# Patient Record
Sex: Male | Born: 1980
Health system: Southern US, Community
[De-identification: ages and names within clinical notes are randomized; demographics above are authoritative.]

## PROBLEM LIST (undated history)

## (undated) DIAGNOSIS — F32A Depression, unspecified: Secondary | ICD-10-CM

## (undated) DIAGNOSIS — K219 Gastro-esophageal reflux disease without esophagitis: Secondary | ICD-10-CM

## (undated) DIAGNOSIS — A4902 Methicillin resistant Staphylococcus aureus infection, unspecified site: Secondary | ICD-10-CM

## (undated) DIAGNOSIS — F419 Anxiety disorder, unspecified: Secondary | ICD-10-CM

## (undated) HISTORY — DX: Depression, unspecified: F32.A

## (undated) HISTORY — DX: Methicillin resistant Staphylococcus aureus infection, unspecified site: A49.02

## (undated) HISTORY — DX: Gastro-esophageal reflux disease without esophagitis: K21.9

## (undated) HISTORY — PX: NECK SURGERY: SHX720

## (undated) HISTORY — PX: OTHER SURGICAL HISTORY: SHX169

## (undated) HISTORY — DX: Anxiety disorder, unspecified: F41.9

---

## 2003-01-13 DIAGNOSIS — A4902 Methicillin resistant Staphylococcus aureus infection, unspecified site: Secondary | ICD-10-CM

## 2003-01-13 HISTORY — DX: Methicillin resistant Staphylococcus aureus infection, unspecified site: A49.02

## 2007-08-24 DIAGNOSIS — B353 Tinea pedis: Secondary | ICD-10-CM | POA: Insufficient documentation

## 2007-08-24 DIAGNOSIS — L408 Other psoriasis: Secondary | ICD-10-CM | POA: Insufficient documentation

## 2008-07-29 ENCOUNTER — Emergency Department (HOSPITAL_COMMUNITY): Admission: EM | Admit: 2008-07-29 | Discharge: 2008-07-29 | Payer: Self-pay | Admitting: Emergency Medicine

## 2009-04-01 ENCOUNTER — Encounter (INDEPENDENT_AMBULATORY_CARE_PROVIDER_SITE_OTHER): Payer: Self-pay | Admitting: *Deleted

## 2009-04-12 ENCOUNTER — Ambulatory Visit: Payer: Self-pay | Admitting: Family Medicine

## 2009-04-12 DIAGNOSIS — G909 Disorder of the autonomic nervous system, unspecified: Secondary | ICD-10-CM | POA: Insufficient documentation

## 2009-04-18 ENCOUNTER — Ambulatory Visit: Payer: Self-pay | Admitting: Family Medicine

## 2009-04-19 LAB — CONVERTED CEMR LAB
Albumin: 4.4 g/dL (ref 3.5–5.2)
BUN: 12 mg/dL (ref 6–23)
Basophils Relative: 0.7 % (ref 0.0–3.0)
CO2: 29 meq/L (ref 19–32)
Calcium: 9.8 mg/dL (ref 8.4–10.5)
Creatinine, Ser: 0.9 mg/dL (ref 0.4–1.5)
Eosinophils Relative: 6.1 % — ABNORMAL HIGH (ref 0.0–5.0)
Glucose, Bld: 82 mg/dL (ref 70–99)
HCT: 39.5 % (ref 39.0–52.0)
HDL: 48.2 mg/dL (ref >39.00)
Hemoglobin: 13.6 g/dL (ref 13.0–17.0)
Lymphs Abs: 1.7 10*3/uL (ref 0.7–4.0)
Monocytes Relative: 9.8 % (ref 3.0–12.0)
Neutro Abs: 3.8 10*3/uL (ref 1.4–7.7)
RDW: 13.3 % (ref 11.5–14.6)
Total Protein: 6.9 g/dL (ref 6.0–8.3)
Triglycerides: 52 mg/dL (ref 0.0–149.0)

## 2010-02-11 NOTE — Letter (Signed)
Summary: New Patient Letter  [facility] [place]  [address] [place], [place] [address]   Phone: [phone]  Fax: [phone]       04/01/2009 MRN: [id number]  [facility] [address] [place], [place]  [address]  Dear Mr. HEMENWAY,   Welcome to [facility] and thank you for choosing [place] as your Primary Care Providers. Enclosed you will find information about our practice that we hope you find helpful. We have also enclosed forms to be filled out prior to your visit. This will provide [place] with the necessary information and facilitate your being seen in a timely manner. If you have any questions, please call us at: [phone] and we will be happy to assist you. We look forward to seeing you at your scheduled appointment time.  Appointment Friday, April 12, 2009 at 2:15pm  with Dr. Loreen Freud   Sincerely,  Primary Health Care Team  Please arrive 15 minutes early for your first appointment and bring your insurance card. Co-pay is required at the time of your visit.  *****Please call the office if you are not able to keep this appointment. There is a charge of $50.00 if any appointment is not cancelled or rescheduled within 24 hours*****

## 2010-02-11 NOTE — Assessment & Plan Note (Signed)
Summary: NEW TO EST,[employer] INS/RH...........Larry Riley   Vital Signs:  Patient profile:   30 year old male Height:      68 inches Weight:      172 pounds BMI:     26.25 Pulse rate:   82 / minute Pulse rhythm:   regular BP sitting:   122 / 76  (left arm) Cuff size:   regular  Vitals Entered By: Army Fossa CMA (April 12, 2009 2:16 PM) CC: Pt here to establish, CPX   History of Present Illness: Pt here for cpe.  Pt with hx neurofibromatoisis.  Pt has been without insurance and was unable to go to Dr.  Neuro had wanted to get him in for new MRI and Neurosurgeon.  Pt has been having headaches but not worse than usual.   Pt dx in 8th grade.  Preventive Screening-Counseling & Management  Alcohol-Tobacco     Alcohol drinks/day: <1     Smoking Status: never  Caffeine-Diet-Exercise     Caffeine use/day: 3-4 x a week     Does Patient Exercise: yes     Type of exercise: gym     Times/week: 3     Exercise Counseling: to improve exercise regimen  Hep-HIV-STD-Contraception     Dental Visit-last 6 months yes     Dental Care Counseling: not indicated; dental care within six months      Drug Use:  no.    Current Medications (verified): 1)  Lotrisone 1-0.05 % Crea (Clotrimazole-Betamethasone) .... Apply Two Times A Day  Allergies (verified): No Known Drug Allergies  Past History:  Family History: Last updated: 04/12/2009 Family History of Arthritis Family History Diabetes 1st degree relative Family History High cholesterol Family History Hypertension Family History of Prostate CA 1st degree relative <50 Family History of Stroke M 1st degree relative <50  Social History: Last updated: 04/12/2009 Single Never Smoked Alcohol use-yes Drug use-no Regular exercise-yes Occupation: [employer]  Risk Factors: Alcohol Use: <1 (04/12/2009) Caffeine Use: 3-4 x a week (04/12/2009) Exercise: yes (04/12/2009)  Risk Factors: Smoking Status: never (04/12/2009)  Past Medical  History: Neurofibromatosis---tumor down spine and brain MRSA--2005-- leg  Past Surgical History: Neck Tumor Removal Hydrocephalus--- shunt 1999-[place]  Family History: Reviewed history and no changes required. Family History of Arthritis Family History Diabetes 1st degree relative Family History High cholesterol Family History Hypertension Family History of Prostate CA 1st degree relative <50 Family History of Stroke M 1st degree relative <50  Social History: Reviewed history and no changes required. Single Never Smoked Alcohol use-yes Drug use-no Regular exercise-yes Occupation: [employer] Smoking Status:  never Drug Use:  no Does Patient Exercise:  yes Caffeine use/day:  3-4 x a week Dental Care w/in 6 mos.:  yes Occupation:  employed  Review of Systems      See HPI General:  Denies chills, fatigue, fever, loss of appetite, malaise, sleep disorder, sweats, weakness, and weight loss. Eyes:  Denies blurring, discharge, double vision, eye irritation, eye pain, halos, itching, light sensitivity, red eye, vision loss-1 eye, and vision loss-both eyes; optho--due . ENT:  Denies decreased hearing, difficulty swallowing, ear discharge, earache, hoarseness, nasal congestion, nosebleeds, postnasal drainage, ringing in ears, sinus pressure, and sore throat. CV:  Denies bluish discoloration of lips or nails, chest pain or discomfort, difficulty breathing at night, difficulty breathing while lying down, fainting, fatigue, leg cramps with exertion, lightheadness, near fainting, palpitations, shortness of breath with exertion, swelling of feet, swelling of hands, and weight gain. Resp:  Denies  chest discomfort, chest pain with inspiration, cough, coughing up blood, excessive snoring, hypersomnolence, morning headaches, pleuritic, shortness of breath, sputum productive, and wheezing. GI:  Denies abdominal pain, bloody stools, change in bowel habits, constipation, dark tarry stools,  diarrhea, excessive appetite, gas, hemorrhoids, indigestion, loss of appetite, nausea, vomiting, vomiting blood, and yellowish skin color. GU:  Denies decreased libido, discharge, dysuria, erectile dysfunction, genital sores, hematuria, incontinence, nocturia, urinary frequency, and urinary hesitancy. MS:  Denies joint pain, joint redness, joint swelling, loss of strength, low back pain, mid back pain, muscle aches, muscle , cramps, muscle weakness, stiffness, and thoracic pain. Derm:  Denies changes in color of skin, changes in nail beds, dryness, excessive perspiration, flushing, hair loss, insect bite(s), itching, lesion(s), poor wound healing, and rash. Neuro:  Denies brief paralysis, difficulty with concentration, disturbances in coordination, falling down, headaches, inability to speak, memory loss, numbness, poor balance, seizures, sensation of room spinning, tingling, tremors, visual disturbances, and weakness. Psych:  Denies alternate hallucination ( auditory/visual), anxiety, depression, easily angered, easily tearful, irritability, mental problems, panic attacks, sense of great danger, suicidal thoughts/plans, thoughts of violence, unusual visions or sounds, and thoughts /plans of harming others. Endo:  Denies cold intolerance, excessive hunger, excessive thirst, excessive urination, heat intolerance, polyuria, and weight change. Heme:  Denies abnormal bruising, bleeding, enlarge lymph nodes, fevers, pallor, and skin discoloration. Allergy:  Denies hives or rash, itching eyes, persistent infections, seasonal allergies, and sneezing.  Physical Exam  General:  Well-developed,well-nourished,in no acute distress; alert,appropriate and cooperative throughout examination Head:  Normocephalic and atraumatic without obvious abnormalities. No apparent alopecia or balding. Eyes:  L pupil smaller---horner's syndrome  Ears:  External ear exam shows no significant lesions or deformities.  Otoscopic  examination reveals clear canals, tympanic membranes are intact bilaterally without bulging, retraction, inflammation or discharge. Hearing is grossly normal bilaterally. Nose:  External nasal examination shows no deformity or inflammation. Nasal mucosa are pink and moist without lesions or exudates. Mouth:  Oral mucosa and oropharynx without lesions or exudates.  Teeth in good repair. Neck:  No deformities, masses, or tenderness noted. Chest Wall:  No deformities, masses, tenderness or gynecomastia noted. Lungs:  Normal respiratory effort, chest expands symmetrically. Lungs are clear to auscultation, no crackles or wheezes. Heart:  normal rate and no murmur.   Abdomen:  Bowel sounds positive,abdomen soft and non-tender without masses, organomegaly or hernias noted. Genitalia:  Testes bilaterally descended without nodularity, tenderness or masses. No scrotal masses or lesions. No penis lesions or urethral discharge. Msk:  normal ROM, no joint tenderness, no joint swelling, no joint warmth, no redness over joints, no joint deformities, no joint instability, and no crepitation.   Pulses:  R and L carotid,radial,femoral,dorsalis pedis and posterior tibial pulses are full and equal bilaterally Extremities:  No clubbing, cyanosis, edema, or deformity noted with normal full range of motion of all joints.   Neurologic:  alert & oriented X3, cranial nerves II-XII intact, strength normal in all extremities, gait normal, and DTRs symmetrical and normal.   Skin:  Intact without suspicious lesions or rashes Cervical Nodes:  No lymphadenopathy noted Psych:  Cognition and judgment appear intact. Alert and cooperative with normal attention span and concentration. No apparent delusions, illusions, hallucinations   Impression & Recommendations:  Problem # 1:  PREVENTIVE HEALTH CARE (ICD-V70.0) schedule labs ghm utd Reviewed preventive care protocols, scheduled due services, and updated  immunizations.  Problem # 2:  NEUROFIBROMATOSIS, TYPE 2 (ICD-237.72) need records from neuro will prob need NS referral  Complete Medication List: 1)  Lotrisone 1-0.05 % Crea (Clotrimazole-betamethasone) .... Apply two times a day  Patient Instructions: 1)  V70.0   cbcd, bmp, hep, lipid, tsh Prescriptions: LOTRISONE 1-0.05 % CREA (CLOTRIMAZOLE-BETAMETHASONE) apply two times a day  #30 g x 0   Entered and Authorized by:   Loreen Freud DO   Signed by:   Loreen Freud DO on 04/12/2009   Method used:   Electronically to        [facility] [phone]* (retail)       [address]       [place], [place]  [address]       Ph: [phone]       Fax: [phone]   RxID:   [id number]

## 2010-06-21 ENCOUNTER — Encounter: Payer: Self-pay | Admitting: Family Medicine

## 2010-06-23 ENCOUNTER — Encounter: Payer: Self-pay | Admitting: Family Medicine

## 2010-06-23 ENCOUNTER — Ambulatory Visit (INDEPENDENT_AMBULATORY_CARE_PROVIDER_SITE_OTHER): Payer: 59 | Admitting: Family Medicine

## 2010-06-23 VITALS — BP 108/72 | HR 77 | Temp 99.0°F | Ht 68.0 in | Wt 180.0 lb

## 2010-06-23 DIAGNOSIS — Z0001 Encounter for general adult medical examination with abnormal findings: Secondary | ICD-10-CM | POA: Insufficient documentation

## 2010-06-23 DIAGNOSIS — Q8502 Neurofibromatosis, type 2: Secondary | ICD-10-CM

## 2010-06-23 DIAGNOSIS — Z0000 Encounter for general adult medical examination without abnormal findings: Secondary | ICD-10-CM | POA: Insufficient documentation

## 2010-06-23 DIAGNOSIS — Q8501 Neurofibromatosis, type 1: Secondary | ICD-10-CM

## 2010-06-23 DIAGNOSIS — R319 Hematuria, unspecified: Secondary | ICD-10-CM

## 2010-06-23 LAB — POCT URINALYSIS DIPSTICK
Bilirubin, UA: NEGATIVE
Glucose, UA: NEGATIVE
Ketones, UA: NEGATIVE
Nitrite, UA: NEGATIVE
Spec Grav, UA: 1
pH, UA: 7

## 2010-06-23 LAB — CBC WITH DIFFERENTIAL/PLATELET
Basophils Relative: 0.7 % (ref 0.0–3.0)
Eosinophils Absolute: 0.2 10*3/uL (ref 0.0–0.7)
HCT: 39 % (ref 39.0–52.0)
Lymphs Abs: 1.6 10*3/uL (ref 0.7–4.0)
MCHC: 34.6 g/dL (ref 30.0–36.0)
MCV: 88.7 fl (ref 78.0–100.0)
Monocytes Absolute: 0.5 10*3/uL (ref 0.1–1.0)
Neutrophils Relative %: 60.7 % (ref 43.0–77.0)
Platelets: 362 10*3/uL (ref 150.0–400.0)
RBC: 4.4 Mil/uL (ref 4.22–5.81)

## 2010-06-23 LAB — HEPATIC FUNCTION PANEL
ALT: 25 U/L (ref 0–53)
Bilirubin, Direct: 0.1 mg/dL (ref 0.0–0.3)
Total Protein: 7.3 g/dL (ref 6.0–8.3)

## 2010-06-23 LAB — BASIC METABOLIC PANEL
BUN: 10 mg/dL (ref 6–23)
CO2: 29 mEq/L (ref 19–32)
Chloride: 107 mEq/L (ref 96–112)
Creatinine, Ser: 1 mg/dL (ref 0.4–1.5)
Potassium: 4.2 mEq/L (ref 3.5–5.1)

## 2010-06-23 LAB — LIPID PANEL
Cholesterol: 153 mg/dL (ref 0–200)
HDL: 49.5 mg/dL (ref >39.00)
Total CHOL/HDL Ratio: 3
Triglycerides: 58 mg/dL (ref 0.0–149.0)

## 2010-06-23 NOTE — Progress Notes (Signed)
  Subjective:    Patient ID: Larry Riley, male    DOB: Feb 23, 1980, 30 y.o.   MRN: [id number]  HPI  Pt here for cpe and labs.  Pt is having trouble getting Med records from previous DR.  Headaches are no worse than before but he needs new MRI and referral.  Review of Systems  Review of Systems  Constitutional: Negative for activity change, appetite change and fatigue.  HENT: Negative for hearing loss, congestion, tinnitus and ear discharge.  Dentist q26m Eyes: Negative for visual disturbance (see optho q2y) Respiratory: Negative for cough, chest tightness and shortness of breath.   Cardiovascular: Negative for chest pain, palpitations and leg swelling.  Gastrointestinal: Negative for abdominal pain, diarrhea, constipation and abdominal distention.  Genitourinary: Negative for urgency, frequency, decreased urine volume and difficulty urinating.  Musculoskeletal: Negative for back pain, arthralgias and gait problem.  Skin: Negative for color change, pallor and rash.  Neurological: Negative for dizziness, light-headedness, numbness.   + headaches Hematological: Negative for adenopathy. Does not bruise/bleed easily.  Psychiatric/Behavioral: Negative for suicidal ideas, confusion, sleep disturbance, self-injury, dysphoric mood, decreased concentration and agitation.      Objective:   Physical Exam    BP 108/72  Pulse 77  Temp(Src) 99 F (37.2 C) (Oral)  Ht 5\' 8"  (1.727 m)  Wt 180 lb (81.647 kg)  BMI 27.37 kg/m2  SpO2 97% General appearance: alert, cooperative, appears stated age and no distress Head: Normocephalic, without obvious abnormality, atraumatic Eyes: conjunctivae/corneas clear. PERRL, EOM's intact. Fundi benign. Ears: normal TM's and external ear canals both ears Nose: Nares normal. Septum midline. Mucosa normal. No drainage or sinus tenderness. Throat: lips, mucosa, and tongue normal; teeth and gums normal Neck: no adenopathy, no carotid bruit, no JVD, supple,  symmetrical, trachea midline and thyroid not enlarged, symmetric, no tenderness/mass/nodules Lungs: clear to auscultation bilaterally Chest wall: no tenderness Heart: regular rate and rhythm, S1, S2 normal, no murmur, click, rub or gallop Abdomen: soft, non-tender; bowel sounds normal; no masses,  no organomegaly Male genitalia: normal Extremities: extremities normal, atraumatic, no cyanosis or edema Pulses: 2+ and symmetric Skin: Skin color, texture, turgor normal. No rashes or lesions Lymph nodes: Cervical, supraclavicular, and axillary nodes normal. Neurologic: Alert and oriented X 3, normal strength and tone. Normal symmetric reflexes. Normal coordination and gait Cranial nerves: XII: tongue strength deviated to left slightly  Sensory: normal Motor:grossly normal Reflexes: normal 2+ and symmetric Coordination: normal Gait: Normal Psych---no depression , anxiety   Assessment & Plan:

## 2010-06-23 NOTE — Patient Instructions (Signed)
Preventative Care for Adults, Male   MAINTAIN REGULAR HEALTH EXAMS   A routine yearly physical is a good way to check in with your primary care provider about your health and preventive screening. It is also an opportunity to share updates about your health and any concerns you have and receive a thorough all-over exam.   If you smoke or chew tobacco, find out from your caregiver how to quit. It can literally save your life, no matter how long you have been a tobacco user. If you do not use tobacco, never begin.   Maintain a healthy diet and normal weight. Increased weight leads to problems with blood pressure and diabetes. Decrease saturated fat in the diet and increase regular exercise. Get information about proper diet from your caregiver if necessary. Eat a variety of foods, including fruit, vegetables, animal or vegetable protein, such as meat, fish, chicken, and eggs, or beans, lentils, tofu, and grains, such as rice.   High blood pressure causes heart and blood vessel problems. Fat leaves deposits in your arteries that can block them. This causes heart disease and vessel disease elsewhere in your body. Check your blood pressure regularly and keep it within normal limits. Men over age 50 or those who have a family history of high blood pressure should have it checked at least every year.   Aerobic exercise helps maintain good heart health. 30 minutes of moderate-intensity exercise is recommended. For example, a brisk walk that increases your heart rate and breathing. This walk should be done on most days of the week. Persistent high blood pressure should be treated with medicine if weight loss and exercise do not work.   For many men aged 20 and older, having a cholesterol test of the blood every 5 years is recommended. If your cholesterol is found to be borderline high, or if you have heart disease or certain other medical conditions, then you may need to have it monitored more frequently.   Avoid smoking,  drinking alcohol in excess (more than two drinks per day) or use of street drugs. Do not share needles with anyone. Ask for professional help if you need assistance or instructions on stopping the use of alcohol, cigarettes, and/or drugs.   Maintain normal blood lipids and cholesterol by minimizing your intake of saturated fat. Eat a well rounded diet otherwise, with plenty of fruit and vegetables. The National Institutes of Health encourage men to eat 5-9 servings of fruit and vegetables each day. Your caregiver can help you keep your risk of heart disease or stroke at a lower level.   Ask your caregiver if you are in need of earlier testing because of: a strong family history of heart disease, or you have signs of elevated testosterone (male sex hormone) levels. This can predispose you to earlier heart disease. Ask if you should have a stress test if your history suggests this. A stress test is a test done on a treadmill that looks for heart disease. This test can find disease prior to there being a problem.   Diabetes screening assesses your blood sugar level after a fasting once every 3 years after age 45 if previous tests were normal.   Most routine colon cancer screening begins at the age of 50. On a yearly basis, doctors may provide special easy to use take-home tests to check for hidden blood in the stool. Sigmoidoscopy or colonoscopy can detect the earliest forms of colon cancer and is life saving. These test use a   small camera at the end of a tube to directly examine the colon. Speak to your caregiver about this at age 50, when routine screening begins (and is repeated every 5 years unless early forms of pre-cancerous polyps or small growths are found).   At the age of 50 men usually start screening for prostate cancer every year. Screening may begin at a younger age for those with higher risk. Those at higher risk include African-Americans or having a family history of prostate cancer. There are two types  of tests for prostate cancer:   Prostate-specific antigen (PSA) testing. Recent studies raise questions about prostate cancer using PSA and you should discuss this with your caregiver.   Digital rectal exam (in which your doctor’s lubricated and gloved finger feels for enlargement of the prostate through the anus).   Practice safe sex. Use condoms. Condoms are used for birth control and to help reduce the spread of sexually transmitted infections (or STIs). Unsafe sex is having an unprotected physical relationship with someone who is bisexual, homosexual, uses intravenous street drugs, or going with someone who has sexual relations with high-risk groups. Practicing safe sex helps you avoid getting an STI. Some of the STIs are gonorrhea (the clap), chlamydia, syphilis, trichimonas, herpes, HPV (human papiloma virus) and HIV (human immunodeficiency virus) which causes AIDS. The herpes, HIV and HPV are viral illnesses that have no cure. These can result in disability, cancer and death.   It is not safe for someone who has AIDS or is HIV positive to have unprotected sex with someone else who is positive. The reason for this is the fact that there are many different strains of HIV. If you have a strain that is readily treated with medications and then suddenly introduce a strain from a partner that has no further treatment options, you may suddenly have a strain of HIV that is untreatable. Even if you are both positive for HIV, it is still necessary to practice safe sex.   Use sunscreen with a SPF (or skin protection factor) of 15 or greater. Apply sunscreen liberally and repeatedly throughout the day. Being outside in the sun when your shadow caused by the sun is shorter than you are, means you are being exposed to sun at greater intensity. Lighter skinned people are at a greater risk of skin cancer. Don’t forget to also wear sunglasses in order to protect your eyes from too much damaging sunlight. Damaging sunlight can  accelerate cataract formation.   Once a month do a whole body skin exam or review, using a mirror to look at your back. Notify your caregivers of changes in moles, especially if there are changes in shapes, colors, a size larger than a pencil eraser, an irregular border, or development of new moles.   Keep carbon monoxide and smoke detectors in your home functioning at all times. Change the batteries every 6 months or use a model that plugs into the wall.   Do a monthly exam of your testicles. Gently roll each testicle between your thumb and fingers, feeling for any abnormal lumps. The best time to do this is after a hot shower or bath when the tissues are looser. Notify your caregivers of any lumps, tenderness or changes in size or shape immediately.   Stay up to date with your tetanus shots and other required immunizations. You should have a booster for tetanus every 10 years. Be sure to get your flu shot every year, since 5%-20% of the [place] population   comes down with the flu. The flu vaccine changes each year, so being vaccinated once is not enough. Get your shot in the fall, before the flu season peaks. The table below lists important vaccines to get. Other vaccines to consider include:   Hepatitis A virus (to prevent a form of infection of the liver by a virus acquired from food), Varicella Zoster (a virus that causes shingles).   Meningococcal (against bacteria which cause a form of meningitis).   Brush your teeth twice a day with fluoride toothpaste, and floss once a day. Good oral hygiene prevents tooth decay and gum disease. The problems can be painful, unattractive, and can cause other health problems. Visit your dentist for a routine oral and dental check up and preventive care every 6-12 months.   The Body Mass Index or BMI is a way of measuring how much of your body is fat. Having a BMI above 27 increases the risk of heart disease, diabetes, hypertension, stroke and other problems related to obesity.  Your caregiver can help determine your BMI and based on it develop an exercise and dietary program to help you achieve or maintain this important measurement at a healthful level.   Wear seat belts whenever in a vehicle, whether a passenger or driver, and even for very short drives of a few minutes.   If you bicycle, wear a helmet at all times.   Below is a summary of the most important preventative healthcare services that adult males should seek on a regular basis throughout their lives:   Preventative Care for Adult Males    Preventative Service  Ages 19-39  Ages 40-64  Ages 65 and over    Schedule of medical visits  Every 5 years  Every 5 years     Schedule of dental visits  Every 6-12 months  Every 6-12 months     Health risk assessment and lifestyle counseling  Every 3-5 years  Every 3-5 years  Every 3-5 years    Blood pressure check**  Every 2 years  Every 2 years  Every 2 years    Total cholesterol check including HDL**  Every 5 years beginning at age 35  Every 5 years  Every 5 years through age 75, then optional.    Flexible sigmoidoscopy or colonoscopy**   Every 5 years beginning at age 50  Every 5 years through age 80, then optional.    Prostate screening   Every year beginning at age 50  Every Year    Testicular exam  Monthly  Monthly  Monthly    FOBT (fecal occult blood test)   Every year beginning at age 50  Every year until 80, then optional.    Skin self-exam  Monthly  Monthly  Monthly    Tetanus-diphtheria (Td) immunization  Every 10 years  Every 10 years  Every 10 years    Influenza immunization**  Every year  Every year  Every year    Pneumococcal immunization**  Optional  Optional  Every 5 years    Hepatitis B immunization**  Series of 3 immunizations   (if not done previously, usually given at 0, 1 to 2, and 4 to 6 months)  Check with your caregiver if vaccination not previously given.  Check with your caregiver if vaccination not previously given.    **Family history and personal history of  risk and conditions may change your physician's recommendations.    Document Released: 02/24/2001 Document Re-Released: 03/25/2009   Exit[employer]® Patient Information ©  2011 [employer].

## 2010-06-23 NOTE — Assessment & Plan Note (Signed)
ghm utd Pt sure he had tetanus--he will check date Check labs

## 2010-06-23 NOTE — Progress Notes (Signed)
Addended by: Legrand Como on: 06/23/2010 10:18 AM   Modules accepted: Orders

## 2010-06-23 NOTE — Assessment & Plan Note (Signed)
Check MRI Pt will bring old MRI with him If MRI no change will refer to neuro---if increase fibromas--refer to neurosurgery No increase in headaches

## 2010-06-23 NOTE — Progress Notes (Signed)
Addended by: Lelon Perla on: 06/23/2010 12:13 PM   Modules accepted: Orders

## 2010-06-25 ENCOUNTER — Telehealth: Payer: Self-pay

## 2010-06-25 ENCOUNTER — Other Ambulatory Visit (HOSPITAL_BASED_OUTPATIENT_CLINIC_OR_DEPARTMENT_OTHER): Payer: 59

## 2010-06-25 LAB — URINE CULTURE: Colony Count: 60000

## 2010-06-25 NOTE — Telephone Encounter (Signed)
mssg left to call the office in reference to UA results below.     KP   Contaminated--- Recheck in next 2 weeks to make sure blood gone.

## 2010-06-25 NOTE — Telephone Encounter (Signed)
Message copied by Arnette Norris on Wed Jun 25, 2010  2:51 PM ------      Message from: Lelon Perla      Created: Tue Jun 24, 2010  9:09 PM       Great!  Recheck labs 1 year.

## 2010-06-28 ENCOUNTER — Ambulatory Visit (HOSPITAL_BASED_OUTPATIENT_CLINIC_OR_DEPARTMENT_OTHER): Payer: 59

## 2010-07-04 ENCOUNTER — Other Ambulatory Visit: Payer: Self-pay | Admitting: Family Medicine

## 2010-07-04 DIAGNOSIS — Q8502 Neurofibromatosis, type 2: Secondary | ICD-10-CM

## 2010-07-04 NOTE — Telephone Encounter (Signed)
Discussed with patient , he will come back to the office to recheck urine     Kp

## 2010-07-08 ENCOUNTER — Other Ambulatory Visit: Payer: Self-pay | Admitting: Family Medicine

## 2010-07-09 ENCOUNTER — Other Ambulatory Visit: Payer: Self-pay | Admitting: Family Medicine

## 2010-07-09 ENCOUNTER — Telehealth: Payer: Self-pay | Admitting: Family Medicine

## 2010-07-09 DIAGNOSIS — Q8500 Neurofibromatosis, unspecified: Secondary | ICD-10-CM

## 2010-07-09 NOTE — Telephone Encounter (Signed)
I called to inform patient that all Imaging studies have been approved his insurance company, and that the tests will need to be split up into 2 visits.  Patient is questioning why a CT Chest is being ordered instead of MRI.  I did explain to him that this is the recommended/preferred study per the Radiologist.  Patient still requesting to speak with Dr. Laury Axon or Cala Bradford about CT.

## 2010-07-09 NOTE — Telephone Encounter (Signed)
Discussed with patient and he stated he would get CT if recommended by Dr.Lowne. CT was not recommended and it has been removed from orders    KP

## 2010-07-12 ENCOUNTER — Ambulatory Visit (INDEPENDENT_AMBULATORY_CARE_PROVIDER_SITE_OTHER)
Admission: RE | Admit: 2010-07-12 | Discharge: 2010-07-12 | Disposition: A | Discharge status: Home / Self Care | Payer: 59 | Source: Ambulatory Visit | Attending: Family Medicine | Admitting: Family Medicine

## 2010-07-12 ENCOUNTER — Other Ambulatory Visit (HOSPITAL_BASED_OUTPATIENT_CLINIC_OR_DEPARTMENT_OTHER): Payer: 59

## 2010-07-12 ENCOUNTER — Ambulatory Visit (HOSPITAL_BASED_OUTPATIENT_CLINIC_OR_DEPARTMENT_OTHER)
Admission: RE | Admit: 2010-07-12 | Discharge: 2010-07-12 | Disposition: A | Discharge status: Home / Self Care | Payer: 59 | Source: Ambulatory Visit | Attending: Family Medicine | Admitting: Family Medicine

## 2010-07-12 ENCOUNTER — Inpatient Hospital Stay (HOSPITAL_BASED_OUTPATIENT_CLINIC_OR_DEPARTMENT_OTHER): Admission: RE | Admit: 2010-07-12 | Payer: 59 | Source: Ambulatory Visit

## 2010-07-12 DIAGNOSIS — M549 Dorsalgia, unspecified: Secondary | ICD-10-CM

## 2010-07-12 DIAGNOSIS — M4120 Other idiopathic scoliosis, site unspecified: Secondary | ICD-10-CM

## 2010-07-12 DIAGNOSIS — M6281 Muscle weakness (generalized): Secondary | ICD-10-CM

## 2010-07-12 DIAGNOSIS — Q8500 Neurofibromatosis, unspecified: Secondary | ICD-10-CM

## 2010-07-12 DIAGNOSIS — Q8501 Neurofibromatosis, type 1: Secondary | ICD-10-CM

## 2010-07-12 DIAGNOSIS — M4804 Spinal stenosis, thoracic region: Secondary | ICD-10-CM

## 2010-07-12 DIAGNOSIS — Q619 Cystic kidney disease, unspecified: Secondary | ICD-10-CM

## 2010-07-12 MED ORDER — GADOBENATE DIMEGLUMINE 529 MG/ML IV SOLN
17.0000 mL | Freq: Once | INTRAVENOUS | Status: AC | PRN
  Administered 2010-07-12: 17 mL via INTRAVENOUS

## 2010-07-14 ENCOUNTER — Other Ambulatory Visit: Payer: Self-pay | Admitting: Family Medicine

## 2010-07-14 DIAGNOSIS — Q8500 Neurofibromatosis, unspecified: Secondary | ICD-10-CM

## 2010-07-14 NOTE — Progress Notes (Signed)
D/w patient--ref put in    KP

## 2010-07-14 NOTE — Progress Notes (Signed)
D/w patient--ref put in    KP 

## 2011-11-25 ENCOUNTER — Telehealth: Payer: Self-pay | Admitting: Family Medicine

## 2011-11-25 NOTE — Telephone Encounter (Signed)
Caller: Larry Riley/Patient; Patient Name: Larry Riley; PCP: Lelon Perla.; Best Callback Phone Number: [phone] Pt calling regarding increaed swelling and bruising in rt ankle/foot/toes after injury on 11/16/11. Pt has been weight bearing but feels like injury is getting worse not better. Denies any numbness or change in sensation. Wishes to have ankle xrayed. Emergent sx of AnkleInjury r/o. See w/in 4 hr for: New marked swelling. Pt to go to [facility] this evening. Gave pt info.

## 2011-11-26 ENCOUNTER — Telehealth: Payer: Self-pay

## 2011-11-26 NOTE — Telephone Encounter (Signed)
For MD to review      KP 

## 2011-11-26 NOTE — Telephone Encounter (Signed)
11/25/2011 5:43 PM Signed Caller: [name]/Patient; Patient Name: Larry Riley; PCP: Lelon Perla.; [facility]back Phone Number: [phone] Pt calling regarding increaed swelling and bruising in rt ankle/foot/toes after injury on 11/16/11. Pt has been weight bearing but feels like injury is getting worse not better. Denies any numbness or change in sensation. Wishes to have ankle xrayed. Emergent sx of AnkleInjury r/o. See w/in 4 hr for: New marked swelling. Pt to go to [facility] this evening. Gave pt info.

## 2011-11-26 NOTE — Telephone Encounter (Signed)
Duplicate   KP 

## 2011-11-26 NOTE — Telephone Encounter (Signed)
Check on pt today 

## 2011-11-26 NOTE — Telephone Encounter (Addendum)
Minor Fracture per patient and he does have a boot on but he is doing ok.     KP

## 2011-12-11 ENCOUNTER — Ambulatory Visit (INDEPENDENT_AMBULATORY_CARE_PROVIDER_SITE_OTHER): Payer: 59 | Admitting: Family Medicine

## 2011-12-11 ENCOUNTER — Encounter: Payer: Self-pay | Admitting: Family Medicine

## 2011-12-11 VITALS — BP 110/70 | HR 97 | Temp 98.7°F | Ht 68.5 in | Wt 173.2 lb

## 2011-12-11 DIAGNOSIS — Z0000 Encounter for general adult medical examination without abnormal findings: Secondary | ICD-10-CM

## 2011-12-11 DIAGNOSIS — Z23 Encounter for immunization: Secondary | ICD-10-CM

## 2011-12-11 DIAGNOSIS — K219 Gastro-esophageal reflux disease without esophagitis: Secondary | ICD-10-CM

## 2011-12-11 LAB — LIPID PANEL
HDL: 66.2 mg/dL (ref >39.00)
LDL Cholesterol: 77 mg/dL (ref 0–99)
VLDL: 8.4 mg/dL (ref 0.0–40.0)

## 2011-12-11 LAB — HEPATIC FUNCTION PANEL
AST: 27 U/L (ref 0–37)
Albumin: 4.5 g/dL (ref 3.5–5.2)
Alkaline Phosphatase: 69 U/L (ref 39–117)
Total Bilirubin: 1.3 mg/dL — ABNORMAL HIGH (ref 0.3–1.2)

## 2011-12-11 LAB — CBC WITH DIFFERENTIAL/PLATELET
Basophils Relative: 0.5 % (ref 0.0–3.0)
Eosinophils Absolute: 0.2 10*3/uL (ref 0.0–0.7)
Eosinophils Relative: 2 % (ref 0.0–5.0)
Lymphocytes Relative: 24.1 % (ref 12.0–46.0)
MCHC: 33.3 g/dL (ref 30.0–36.0)
MCV: 89.3 fl (ref 78.0–100.0)
Monocytes Absolute: 0.8 10*3/uL (ref 0.1–1.0)
Neutrophils Relative %: 63.9 % (ref 43.0–77.0)
Platelets: 387 10*3/uL (ref 150.0–400.0)
RBC: 4.63 Mil/uL (ref 4.22–5.81)
WBC: 8.1 10*3/uL (ref 4.5–10.5)

## 2011-12-11 LAB — BASIC METABOLIC PANEL
Calcium: 9.9 mg/dL (ref 8.4–10.5)
GFR: 95.69 mL/min (ref >60.00)
Glucose, Bld: 80 mg/dL (ref 70–99)
Potassium: 3.6 mEq/L (ref 3.5–5.1)
Sodium: 137 mEq/L (ref 135–145)

## 2011-12-11 LAB — TSH: TSH: 0.88 u[IU]/mL (ref 0.35–5.50)

## 2011-12-11 MED ORDER — PANTOPRAZOLE SODIUM 40 MG PO TBEC: 40.0000 mg | DELAYED_RELEASE_TABLET | Freq: Every day | ORAL | Status: DC

## 2011-12-11 NOTE — Patient Instructions (Addendum)
Preventive Care for Adults, Male A healthy lifestyle and preventive care can promote health and wellness. Preventive health guidelines for men include the following key practices:  A routine yearly physical is a good way to check with your caregiver about your health and preventative screening. It is a chance to share any concerns and updates on your health, and to receive a thorough exam.  Visit your dentist for a routine exam and preventative care every 6 months. Brush your teeth twice a day and floss once a day. Good oral hygiene prevents tooth decay and gum disease.  The frequency of eye exams is based on your age, health, family medical history, use of contact lenses, and other factors. Follow your caregiver's recommendations for frequency of eye exams.  Eat a healthy diet. Foods like vegetables, fruits, whole grains, low-fat dairy products, and lean protein foods contain the nutrients you need without too many calories. Decrease your intake of foods high in solid fats, added sugars, and salt. Eat the right amount of calories for you.Get information about a proper diet from your caregiver, if necessary.  Regular physical exercise is one of the most important things you can do for your health. Most adults should get at least 150 minutes of moderate-intensity exercise (any activity that increases your heart rate and causes you to sweat) each week. In addition, most adults need muscle-strengthening exercises on 2 or more days a week.  Maintain a healthy weight. The body mass index (BMI) is a screening tool to identify possible weight problems. It provides an estimate of body fat based on height and weight. Your caregiver can help determine your BMI, and can help you achieve or maintain a healthy weight.For adults 20 years and older:  A BMI below 18.5 is considered underweight.  A BMI of 18.5 to 24.9 is normal.  A BMI of 25 to 29.9 is considered overweight.  A BMI of 30 and above is  considered obese.  Maintain normal blood lipids and cholesterol levels by exercising and minimizing your intake of saturated fat. Eat a balanced diet with plenty of fruit and vegetables. Blood tests for lipids and cholesterol should begin at age 20 and be repeated every 5 years. If your lipid or cholesterol levels are high, you are over 50, or you are a high risk for heart disease, you may need your cholesterol levels checked more frequently.Ongoing high lipid and cholesterol levels should be treated with medicines if diet and exercise are not effective.  If you smoke, find out from your caregiver how to quit. If you do not use tobacco, do not start.  If you choose to drink alcohol, do not exceed 2 drinks per day. One drink is considered to be 12 ounces (355 mL) of beer, 5 ounces (148 mL) of wine, or 1.5 ounces (44 mL) of liquor.  Avoid use of street drugs. Do not share needles with anyone. Ask for help if you need support or instructions about stopping the use of drugs.  High blood pressure causes heart disease and increases the risk of stroke. Your blood pressure should be checked at least every 1 to 2 years. Ongoing high blood pressure should be treated with medicines, if weight loss and exercise are not effective.  If you are 45 to 31 years old, ask your caregiver if you should take aspirin to prevent heart disease.  Diabetes screening involves taking a blood sample to check your fasting blood sugar level. This should be done once every 3 years,   after age 45, if you are within normal weight and without risk factors for diabetes. Testing should be considered at a younger age or be carried out more frequently if you are overweight and have at least 1 risk factor for diabetes.  Colorectal cancer can be detected and often prevented. Most routine colorectal cancer screening begins at the age of 50 and continues through age 75. However, your caregiver may recommend screening at an earlier age if you  have risk factors for colon cancer. On a yearly basis, your caregiver may provide home test kits to check for hidden blood in the stool. Use of a small camera at the end of a tube, to directly examine the colon (sigmoidoscopy or colonoscopy), can detect the earliest forms of colorectal cancer. Talk to your caregiver about this at age 50, when routine screening begins. Direct examination of the colon should be repeated every 5 to 10 years through age 75, unless early forms of pre-cancerous polyps or small growths are found.  Hepatitis C blood testing is recommended for all people born from 1945 through 1965 and any individual with known risks for hepatitis C.  Practice safe sex. Use condoms and avoid high-risk sexual practices to reduce the spread of sexually transmitted infections (STIs). STIs include gonorrhea, chlamydia, syphilis, trichomonas, herpes, HPV, and human immunodeficiency virus (HIV). Herpes, HIV, and HPV are viral illnesses that have no cure. They can result in disability, cancer, and death.  A one-time screening for abdominal aortic aneurysm (AAA) and surgical repair of large AAAs by sound wave imaging (ultrasonography) is recommended for ages 65 to 75 years who are current or former smokers.  Healthy men should no longer receive prostate-specific antigen (PSA) blood tests as part of routine cancer screening. Consult with your caregiver about prostate cancer screening.  Testicular cancer screening is not recommended for adult males who have no symptoms. Screening includes self-exam, caregiver exam, and other screening tests. Consult with your caregiver about any symptoms you have or any concerns you have about testicular cancer.  Use sunscreen with skin protection factor (SPF) of 30 or more. Apply sunscreen liberally and repeatedly throughout the day. You should seek shade when your shadow is shorter than you. Protect yourself by wearing long sleeves, pants, a wide-brimmed hat, and  sunglasses year round, whenever you are outdoors.  Once a month, do a whole body skin exam, using a mirror to look at the skin on your back. Notify your caregiver of new moles, moles that have irregular borders, moles that are larger than a pencil eraser, or moles that have changed in shape or color.  Stay current with required immunizations.  Influenza. You need a dose every fall (or winter). The composition of the flu vaccine changes each year, so being vaccinated once is not enough.  Pneumococcal polysaccharide. You need 1 to 2 doses if you smoke cigarettes or if you have certain chronic medical conditions. You need 1 dose at age 65 (or older) if you have never been vaccinated.  Tetanus, diphtheria, pertussis (Tdap, Td). Get 1 dose of Tdap vaccine if you are younger than age 65 years, are over 65 and have contact with an infant, are a healthcare worker, or simply want to be protected from whooping cough. After that, you need a Td booster dose every 10 years. Consult your caregiver if you have not had at least 3 tetanus and diphtheria-containing shots sometime in your life or have a deep or dirty wound.  HPV. This vaccine is recommended   for males 13 through 31 years of age. This vaccine may be given to men 22 through 31 years of age who have not completed the 3 dose series. It is recommended for men through age 26 who have sex with men or whose immune system is weakened because of HIV infection, other illness, or medications. The vaccine is given in 3 doses over 6 months.  Measles, mumps, rubella (MMR). You need at least 1 dose of MMR if you were born in 1957 or later. You may also need a 2nd dose.  Meningococcal. If you are age 19 to 21 years and a first-year college student living in a residence hall, or have one of several medical conditions, you need to get vaccinated against meningococcal disease. You may also need additional booster doses.  Zoster (shingles). If you are age 60 years or  older, you should get this vaccine.  Varicella (chickenpox). If you have never had chickenpox or you were vaccinated but received only 1 dose, talk to your caregiver to find out if you need this vaccine.  Hepatitis A. You need this vaccine if you have a specific risk factor for hepatitis A virus infection, or you simply wish to be protected from this disease. The vaccine is usually given as 2 doses, 6 to 18 months apart.  Hepatitis B. You need this vaccine if you have a specific risk factor for hepatitis B virus infection or you simply wish to be protected from this disease. The vaccine is given in 3 doses, usually over 6 months. Preventative Service / Frequency Ages 19 to 39  Blood pressure check.** / Every 1 to 2 years.  Lipid and cholesterol check.** / Every 5 years beginning at age 20.  Hepatitis C blood test.** / For any individual with known risks for hepatitis C.  Skin self-exam. / Monthly.  Influenza immunization.** / Every year.  Pneumococcal polysaccharide immunization.** / 1 to 2 doses if you smoke cigarettes or if you have certain chronic medical conditions.  Tetanus, diphtheria, pertussis (Tdap,Td) immunization. / A one-time dose of Tdap vaccine. After that, you need a Td booster dose every 10 years.  HPV immunization. / 3 doses over 6 months, if 26 and younger.  Measles, mumps, rubella (MMR) immunization. / You need at least 1 dose of MMR if you were born in 1957 or later. You may also need a 2nd dose.  Meningococcal immunization. / 1 dose if you are age 19 to 21 years and a first-year college student living in a residence hall, or have one of several medical conditions, you need to get vaccinated against meningococcal disease. You may also need additional booster doses.  Varicella immunization.** / Consult your caregiver.  Hepatitis A immunization.** / Consult your caregiver. 2 doses, 6 to 18 months apart.  Hepatitis B immunization.** / Consult your caregiver. 3 doses  usually over 6 months. Ages 40 to 64  Blood pressure check.** / Every 1 to 2 years.  Lipid and cholesterol check.** / Every 5 years beginning at age 20.  Fecal occult blood test (FOBT) of stool. / Every year beginning at age 50 and continuing until age 75. You may not have to do this test if you get colonoscopy every 10 years.  Flexible sigmoidoscopy** or colonoscopy.** / Every 5 years for a flexible sigmoidoscopy or every 10 years for a colonoscopy beginning at age 50 and continuing until age 75.  Hepatitis C blood test.** / For all people born from 1945 through 1965 and any   individual with known risks for hepatitis C.  Skin self-exam. / Monthly.  Influenza immunization.** / Every year.  Pneumococcal polysaccharide immunization.** / 1 to 2 doses if you smoke cigarettes or if you have certain chronic medical conditions.  Tetanus, diphtheria, pertussis (Tdap/Td) immunization.** / A one-time dose of Tdap vaccine. After that, you need a Td booster dose every 10 years.  Measles, mumps, rubella (MMR) immunization. / You need at least 1 dose of MMR if you were born in 1957 or later. You may also need a 2nd dose.  Varicella immunization.**/ Consult your caregiver.  Meningococcal immunization.** / Consult your caregiver.  Hepatitis A immunization.** / Consult your caregiver. 2 doses, 6 to 18 months apart.  Hepatitis B immunization.** / Consult your caregiver. 3 doses, usually over 6 months. Ages 65 and over  Blood pressure check.** / Every 1 to 2 years.  Lipid and cholesterol check.**/ Every 5 years beginning at age 20.  Fecal occult blood test (FOBT) of stool. / Every year beginning at age 50 and continuing until age 75. You may not have to do this test if you get colonoscopy every 10 years.  Flexible sigmoidoscopy** or colonoscopy.** / Every 5 years for a flexible sigmoidoscopy or every 10 years for a colonoscopy beginning at age 50 and continuing until age 75.  Hepatitis C blood  test.** / For all people born from 1945 through 1965 and any individual with known risks for hepatitis C.  Abdominal aortic aneurysm (AAA) screening.** / A one-time screening for ages 65 to 75 years who are current or former smokers.  Skin self-exam. / Monthly.  Influenza immunization.** / Every year.  Pneumococcal polysaccharide immunization.** / 1 dose at age 65 (or older) if you have never been vaccinated.  Tetanus, diphtheria, pertussis (Tdap, Td) immunization. / A one-time dose of Tdap vaccine if you are over 65 and have contact with an infant, are a healthcare worker, or simply want to be protected from whooping cough. After that, you need a Td booster dose every 10 years.  Varicella immunization. ** / Consult your caregiver.  Meningococcal immunization.** / Consult your caregiver.  Hepatitis A immunization. ** / Consult your caregiver. 2 doses, 6 to 18 months apart.  Hepatitis B immunization.** / Check with your caregiver. 3 doses, usually over 6 months. **Family history and personal history of risk and conditions may change your caregiver's recommendations. Document Released: 02/24/2001 Document Revised: 03/23/2011 Document Reviewed: 05/26/2010 Exit[employer] Patient Information 2013 [employer].  

## 2011-12-11 NOTE — Assessment & Plan Note (Signed)
Check labs ghm utd rto 1 year or sooner prn

## 2011-12-11 NOTE — Progress Notes (Signed)
Subjective:    Patient ID: Larry Riley, male    DOB: 03-Oct-1980, 31 y.o.   MRN: [id number]  HPI Pt here for cpe and labs.  No complaints.    Review of Systems    Review of Systems  Constitutional: Negative for activity change, appetite change and fatigue.  HENT: Negative for hearing loss, congestion, tinnitus and ear discharge.  dentist q66m Eyes: Negative for visual disturbance (see optho -no) Respiratory: Negative for cough, chest tightness and shortness of breath.   Cardiovascular: Negative for chest pain, palpitations and leg swelling.  Gastrointestinal: Negative for abdominal pain, diarrhea, constipation and abdominal distention.  Genitourinary: Negative for urgency, frequency, decreased urine volume and difficulty urinating.  Musculoskeletal: Negative for back pain, arthralgias and gait problem.  Skin: Negative for color change, pallor and rash.  Neurological: Negative for dizziness, light-headedness, numbness and headaches.  Hematological: Negative for adenopathy. Does not bruise/bleed easily.  Psychiatric/Behavioral: Negative for suicidal ideas, confusion, sleep disturbance, self-injury, dysphoric mood, decreased concentration and agitation.    Past Medical History  Diagnosis Date  . Neurofibromatosis(237.7)     tumor down spine and brain  . MRSA (methicillin resistant Staphylococcus aureus) 2005    leg   History   Social History  . Marital Status: Single    Spouse Name: N/A    Number of Children: N/A  . Years of Education: N/A   Occupational History  . [employer]    Social History Main Topics  . Smoking status: Never Smoker   . Smokeless tobacco: Never Used  . Alcohol Use: Yes     Comment: very rare  . Drug Use: No  . Sexually Active: No   Other Topics Concern  . Not on file   Social History Narrative   Exercise-- no   Current Outpatient Prescriptions on File Prior to Visit  Medication Sig Dispense Refill  . pantoprazole (PROTONIX) 40 MG  tablet Take 1 tablet (40 mg total) by mouth daily.  90 tablet  3   Family History  Problem Relation Age of Onset  . Prostate cancer    . Diabetes Mother   . Arthritis Father   . Hypertension Father   . Arthritis Paternal Grandmother   . Arthritis Paternal Grandfather   . Diabetes Paternal Grandfather   . Hyperlipidemia Paternal Grandfather   . Hypertension Paternal Grandfather      Objective:   Physical Exam BP 110/70  Pulse 97  Temp 98.7 F (37.1 C) (Oral)  Ht 5' 8.5" (1.74 m)  Wt 173 lb 3.2 oz (78.563 kg)  BMI 25.95 kg/m2  SpO2 99% General appearance: alert, cooperative, appears stated age and no distress Head: Normocephalic, without obvious abnormality, atraumatic Eyes: conjunctivae/corneas clear. PERRL, EOM's intact. Fundi benign. Ears: normal TM's and external ear canals both ears Nose: Nares normal. Septum midline. Mucosa normal. No drainage or sinus tenderness. Throat: lips, mucosa, and tongue normal; teeth and gums normal Neck: no adenopathy, no carotid bruit, no JVD, supple, symmetrical, trachea midline and thyroid not enlarged, symmetric, no tenderness/mass/nodules Back: symmetric, no curvature. ROM normal. No CVA tenderness. Lungs: clear to auscultation bilaterally Chest wall: no tenderness Heart: regular rate and rhythm, S1, S2 normal, no murmur, click, rub or gallop Abdomen: soft, non-tender; bowel sounds normal; no masses,  no organomegaly Male genitalia: normal Extremities: extremities normal, atraumatic, no cyanosis or edema Pulses: 2+ and symmetric Skin: Skin color, texture, turgor normal. No rashes or lesions--scar neck from shunt Lymph nodes: Cervical, supraclavicular, and axillary nodes normal. Neurologic: Alert  and oriented X 3, normal strength and tone. Normal symmetric reflexes. Normal coordination and gait Psych- no depression, no anxiety      Assessment & Plan:

## 2011-12-11 NOTE — Progress Notes (Signed)
Pt could not use bathroom will drop off specimen one day next week.Marland KitchenMarland Kitchen

## 2011-12-21 ENCOUNTER — Encounter: Payer: 59 | Admitting: Family Medicine

## 2012-02-12 ENCOUNTER — Other Ambulatory Visit (HOSPITAL_BASED_OUTPATIENT_CLINIC_OR_DEPARTMENT_OTHER): Payer: Self-pay | Admitting: Neurosurgery

## 2012-02-12 DIAGNOSIS — R52 Pain, unspecified: Secondary | ICD-10-CM

## 2012-02-12 DIAGNOSIS — Q8500 Neurofibromatosis, unspecified: Secondary | ICD-10-CM

## 2012-02-13 ENCOUNTER — Inpatient Hospital Stay (HOSPITAL_BASED_OUTPATIENT_CLINIC_OR_DEPARTMENT_OTHER): Admission: RE | Admit: 2012-02-13 | Payer: 59 | Source: Ambulatory Visit

## 2012-02-13 ENCOUNTER — Ambulatory Visit (HOSPITAL_BASED_OUTPATIENT_CLINIC_OR_DEPARTMENT_OTHER): Admission: RE | Admit: 2012-02-13 | Payer: 59 | Source: Ambulatory Visit

## 2012-02-13 ENCOUNTER — Ambulatory Visit (HOSPITAL_BASED_OUTPATIENT_CLINIC_OR_DEPARTMENT_OTHER)
Admission: RE | Admit: 2012-02-13 | Discharge: 2012-02-13 | Disposition: A | Discharge status: Home / Self Care | Payer: 59 | Source: Ambulatory Visit | Attending: Neurosurgery | Admitting: Neurosurgery

## 2012-08-10 ENCOUNTER — Telehealth: Payer: Self-pay | Admitting: Family Medicine

## 2012-08-10 NOTE — Telephone Encounter (Signed)
Patient is calling in regards to his consult he had with Dr. Bosie Clos at [facility] on 08/09/12. States that he was told to contact Dr. Laury Axon about being issued a referral to Pain Management. Office notes from [facility] were faxed over and placed in Lowne's box. Please advise.

## 2012-08-15 NOTE — Telephone Encounter (Signed)
For MD to review when she return's to the office       KP

## 2012-08-18 NOTE — Telephone Encounter (Signed)
I have not seen the office note

## 2012-08-19 NOTE — Telephone Encounter (Signed)
Would you please call Dr Louis Matte office and ask them to fax the ov note to [place] ? Thank you

## 2012-08-22 NOTE — Telephone Encounter (Signed)
Spoke with Lurena Joiner who will send over the notes, but she said Dr.Schooler advised the patient to follow up with Dr.Lowne for back and neck pain. She is faxing over the notes and I will put them on the ledge for review        KP

## 2012-08-22 NOTE — Telephone Encounter (Signed)
He needs ov---- pain management will need documentation---Dr Schooler did not document pain except to say to see me

## 2012-08-22 NOTE — Telephone Encounter (Signed)
Please offer this patient an appointment.    KP 

## 2012-09-02 ENCOUNTER — Encounter: Payer: Self-pay | Admitting: Family Medicine

## 2012-09-02 ENCOUNTER — Ambulatory Visit (INDEPENDENT_AMBULATORY_CARE_PROVIDER_SITE_OTHER): Payer: 59 | Admitting: Family Medicine

## 2012-09-02 VITALS — BP 110/72 | HR 78 | Temp 98.3°F | Wt 186.4 lb

## 2012-09-02 DIAGNOSIS — Q8502 Neurofibromatosis, type 2: Secondary | ICD-10-CM

## 2012-09-02 MED ORDER — TRAMADOL HCL 50 MG PO TABS: 50.0000 mg | ORAL_TABLET | Freq: Three times a day (TID) | ORAL | Status: DC | PRN

## 2012-09-02 MED ORDER — METAXALONE 800 MG PO TABS: 800.0000 mg | ORAL_TABLET | Freq: Four times a day (QID) | ORAL | Status: DC

## 2012-09-02 NOTE — Assessment & Plan Note (Signed)
Ultram and skelaxin sent to pharmacy Suggested pt discuss pain management with Dr Franky Macho- he may want him to see pain management in their office

## 2012-09-02 NOTE — Progress Notes (Signed)
  Subjective:    Patient ID: Larry Riley, male    DOB: [birth year], 32 y.o.   MRN: [id number]  HPI Pt here f/u neurofibromatosis type 2 -- his pain is becoming a lot worse.  As a result he was taking a lot of tylenol and Ibuprofen and he GI Dr advised he see pain management. The pain is in the neck and low back.     Review of Systems    as above Objective:   Physical Exam  BP 110/72  Pulse 78  Temp(Src) 98.3 F (36.8 C) (Oral)  Wt 186 lb 6.4 oz (84.55 kg)  BMI 27.93 kg/m2  SpO2 98% General appearance: alert, cooperative, appears stated age and no distress Neurologic: Gait: Antalgic      Assessment & Plan:

## 2012-09-02 NOTE — Patient Instructions (Signed)

## 2012-12-12 ENCOUNTER — Encounter: Payer: Self-pay | Admitting: Family Medicine

## 2012-12-12 ENCOUNTER — Ambulatory Visit (INDEPENDENT_AMBULATORY_CARE_PROVIDER_SITE_OTHER): Payer: 59 | Admitting: Family Medicine

## 2012-12-12 VITALS — BP 108/76 | HR 81 | Temp 99.2°F | Ht 69.0 in | Wt 180.0 lb

## 2012-12-12 DIAGNOSIS — K219 Gastro-esophageal reflux disease without esophagitis: Secondary | ICD-10-CM

## 2012-12-12 DIAGNOSIS — R319 Hematuria, unspecified: Secondary | ICD-10-CM

## 2012-12-12 DIAGNOSIS — Z3009 Encounter for other general counseling and advice on contraception: Secondary | ICD-10-CM

## 2012-12-12 DIAGNOSIS — Q8502 Neurofibromatosis, type 2: Secondary | ICD-10-CM

## 2012-12-12 DIAGNOSIS — Z0000 Encounter for general adult medical examination without abnormal findings: Secondary | ICD-10-CM

## 2012-12-12 LAB — POCT URINALYSIS DIPSTICK
Glucose, UA: NEGATIVE
Nitrite, UA: NEGATIVE
Protein, UA: NEGATIVE
Spec Grav, UA: 1.02
Urobilinogen, UA: 0.2
pH, UA: 6.5

## 2012-12-12 LAB — CBC WITH DIFFERENTIAL/PLATELET
Basophils Absolute: 0 10*3/uL (ref 0.0–0.1)
Eosinophils Relative: 2.4 % (ref 0.0–5.0)
MCV: 87.4 fl (ref 78.0–100.0)
Monocytes Absolute: 0.9 10*3/uL (ref 0.1–1.0)
Neutrophils Relative %: 66.3 % (ref 43.0–77.0)
Platelets: 416 10*3/uL — ABNORMAL HIGH (ref 150.0–400.0)
RDW: 12.8 % (ref 11.5–14.6)
WBC: 9.2 10*3/uL (ref 4.5–10.5)

## 2012-12-12 LAB — HEPATIC FUNCTION PANEL
ALT: 29 U/L (ref 0–53)
Bilirubin, Direct: 0.1 mg/dL (ref 0.0–0.3)
Total Bilirubin: 0.7 mg/dL (ref 0.3–1.2)

## 2012-12-12 LAB — LIPID PANEL
Cholesterol: 162 mg/dL (ref 0–200)
HDL: 56.2 mg/dL (ref >39.00)
LDL Cholesterol: 93 mg/dL (ref 0–99)
Total CHOL/HDL Ratio: 3
Triglycerides: 63 mg/dL (ref 0.0–149.0)
VLDL: 12.6 mg/dL (ref 0.0–40.0)

## 2012-12-12 LAB — BASIC METABOLIC PANEL
BUN: 18 mg/dL (ref 6–23)
Creatinine, Ser: 0.9 mg/dL (ref 0.4–1.5)
GFR: 105 mL/min (ref >60.00)
Glucose, Bld: 96 mg/dL (ref 70–99)

## 2012-12-12 MED ORDER — TRAMADOL HCL 50 MG PO TABS: 50.0000 mg | ORAL_TABLET | Freq: Three times a day (TID) | ORAL | Status: DC | PRN

## 2012-12-12 MED ORDER — METAXALONE 800 MG PO TABS: 800.0000 mg | ORAL_TABLET | Freq: Four times a day (QID) | ORAL | Status: DC

## 2012-12-12 MED ORDER — PANTOPRAZOLE SODIUM 40 MG PO TBEC: 40.0000 mg | DELAYED_RELEASE_TABLET | Freq: Every day | ORAL | Status: DC

## 2012-12-12 NOTE — Addendum Note (Signed)
Addended by: Silvio Pate D on: 12/12/2012 04:50 PM   Modules accepted: Orders

## 2012-12-12 NOTE — Assessment & Plan Note (Signed)
Refilled meds F/u NS

## 2012-12-12 NOTE — Patient Instructions (Signed)
 Preventive Care for Adults, Male A healthy lifestyle and preventive care can promote health and wellness. Preventive health guidelines for men include the following key practices:  A routine yearly physical is a good way to check with your caregiver about your health and preventative screening. It is a chance to share any concerns and updates on your health, and to receive a thorough exam.  Visit your dentist for a routine exam and preventative care every 6 months. Brush your teeth twice a day and floss once a day. Good oral hygiene prevents tooth decay and gum disease.  The frequency of eye exams is based on your age, health, family medical history, use of contact lenses, and other factors. Follow your caregiver's recommendations for frequency of eye exams.  Eat a healthy diet. Foods like vegetables, fruits, whole grains, low-fat dairy products, and lean protein foods contain the nutrients you need without too many calories. Decrease your intake of foods high in solid fats, added sugars, and salt. Eat the right amount of calories for you.Get information about a proper diet from your caregiver, if necessary.  Regular physical exercise is one of the most important things you can do for your health. Most adults should get at least 150 minutes of moderate-intensity exercise (any activity that increases your heart rate and causes you to sweat) each week. In addition, most adults need muscle-strengthening exercises on 2 or more days a week.  Maintain a healthy weight. The body mass index (BMI) is a screening tool to identify possible weight problems. It provides an estimate of body fat based on height and weight. Your caregiver can help determine your BMI, and can help you achieve or maintain a healthy weight.For adults 20 years and older:  A BMI below 18.5 is considered underweight.  A BMI of 18.5 to 24.9 is normal.  A BMI of 25 to 29.9 is considered overweight.  A BMI of 30 and above is  considered obese.  Maintain normal blood lipids and cholesterol levels by exercising and minimizing your intake of saturated fat. Eat a balanced diet with plenty of fruit and vegetables. Blood tests for lipids and cholesterol should begin at age 20 and be repeated every 5 years. If your lipid or cholesterol levels are high, you are over 50, or you are a high risk for heart disease, you may need your cholesterol levels checked more frequently.Ongoing high lipid and cholesterol levels should be treated with medicines if diet and exercise are not effective.  If you smoke, find out from your caregiver how to quit. If you do not use tobacco, do not start.  Lung cancer screening is recommended for adults aged 55 80 years who are at high risk for developing lung cancer because of a history of smoking. Yearly low-dose computed tomography (CT) is recommended for people who have at least a 30-pack-year history of smoking and are a current smoker or have quit within the past 15 years. A pack year of smoking is smoking an average of 1 pack of cigarettes a day for 1 year (for example: 1 pack a day for 30 years or 2 packs a day for 15 years). Yearly screening should continue until the smoker has stopped smoking for at least 15 years. Yearly screening should also be stopped for people who develop a health problem that would prevent them from having lung cancer treatment.  If you choose to drink alcohol, do not exceed 2 drinks per day. One drink is considered to be 12   ounces (355 mL) of beer, 5 ounces (148 mL) of wine, or 1.5 ounces (44 mL) of liquor.  Avoid use of street drugs. Do not share needles with anyone. Ask for help if you need support or instructions about stopping the use of drugs.  High blood pressure causes heart disease and increases the risk of stroke. Your blood pressure should be checked at least every 1 to 2 years. Ongoing high blood pressure should be treated with medicines, if weight loss and  exercise are not effective.  If you are 45 to 32 years old, ask your caregiver if you should take aspirin to prevent heart disease.  Diabetes screening involves taking a blood sample to check your fasting blood sugar level. This should be done once every 3 years, after age 45, if you are within normal weight and without risk factors for diabetes. Testing should be considered at a younger age or be carried out more frequently if you are overweight and have at least 1 risk factor for diabetes.  Colorectal cancer can be detected and often prevented. Most routine colorectal cancer screening begins at the age of 50 and continues through age 75. However, your caregiver may recommend screening at an earlier age if you have risk factors for colon cancer. On a yearly basis, your caregiver may provide home test kits to check for hidden blood in the stool. Use of a small camera at the end of a tube, to directly examine the colon (sigmoidoscopy or colonoscopy), can detect the earliest forms of colorectal cancer. Talk to your caregiver about this at age 50, when routine screening begins. Direct examination of the colon should be repeated every 5 to 10 years through age 75, unless early forms of pre-cancerous polyps or small growths are found.  Hepatitis C blood testing is recommended for all people born from 1945 through 1965 and any individual with known risks for hepatitis C.  Practice safe sex. Use condoms and avoid high-risk sexual practices to reduce the spread of sexually transmitted infections (STIs). STIs include gonorrhea, chlamydia, syphilis, trichomonas, herpes, HPV, and human immunodeficiency virus (HIV). Herpes, HIV, and HPV are viral illnesses that have no cure. They can result in disability, cancer, and death.  A one-time screening for abdominal aortic aneurysm (AAA) and surgical repair of large AAAs by sound wave imaging (ultrasonography) is recommended for ages 65 to 75 years who are current or  former smokers.  Healthy men should no longer receive prostate-specific antigen (PSA) blood tests as part of routine cancer screening. Consult with your caregiver about prostate cancer screening.  Testicular cancer screening is not recommended for adult males who have no symptoms. Screening includes self-exam, caregiver exam, and other screening tests. Consult with your caregiver about any symptoms you have or any concerns you have about testicular cancer.  Use sunscreen. Apply sunscreen liberally and repeatedly throughout the day. You should seek shade when your shadow is shorter than you. Protect yourself by wearing long sleeves, pants, a wide-brimmed hat, and sunglasses year round, whenever you are outdoors.  Once a month, do a whole body skin exam, using a mirror to look at the skin on your back. Notify your caregiver of new moles, moles that have irregular borders, moles that are larger than a pencil eraser, or moles that have changed in shape or color.  Stay current with required immunizations.  Influenza vaccine. All adults should be immunized every year.  Tetanus, diphtheria, and acellular pertussis (Td, Tdap) vaccine. An adult who has not   previously received Tdap or who does not know his vaccine status should receive 1 dose of Tdap. This initial dose should be followed by tetanus and diphtheria toxoids (Td) booster doses every 10 years. Adults with an unknown or incomplete history of completing a 3-dose immunization series with Td-containing vaccines should begin or complete a primary immunization series including a Tdap dose. Adults should receive a Td booster every 10 years.  Varicella vaccine. An adult without evidence of immunity to varicella should receive 2 doses or a second dose if he has previously received 1 dose.  Human papillomavirus (HPV) vaccine. Males aged 13 21 years who have not received the vaccine previously should receive the 3-dose series. Males aged 22 26 years may be  immunized. Immunization is recommended through the age of 26 years for any male who has sex with males and did not get any or all doses earlier. Immunization is recommended for any person with an immunocompromised condition through the age of 26 years if he did not get any or all doses earlier. During the 3-dose series, the second dose should be obtained 4 8 weeks after the first dose. The third dose should be obtained 24 weeks after the first dose and 16 weeks after the second dose.  Zoster vaccine. One dose is recommended for adults aged 60 years or older unless certain conditions are present.  Measles, mumps, and rubella (MMR) vaccine. Adults born before 1957 generally are considered immune to measles and mumps. Adults born in 1957 or later should have 1 or more doses of MMR vaccine unless there is a contraindication to the vaccine or there is laboratory evidence of immunity to each of the three diseases. A routine second dose of MMR vaccine should be obtained at least 28 days after the first dose for students attending postsecondary schools, health care workers, or international travelers. People who received inactivated measles vaccine or an unknown type of measles vaccine during 1963 1967 should receive 2 doses of MMR vaccine. People who received inactivated mumps vaccine or an unknown type of mumps vaccine before 1979 and are at high risk for mumps infection should consider immunization with 2 doses of MMR vaccine. Unvaccinated health care workers born before 1957 who lack laboratory evidence of measles, mumps, or rubella immunity or laboratory confirmation of disease should consider measles and mumps immunization with 2 doses of MMR vaccine or rubella immunization with 1 dose of MMR vaccine.  Pneumococcal 13-valent conjugate (PCV13) vaccine. When indicated, a person who is uncertain of his immunization history and has no record of immunization should receive the PCV13 vaccine. An adult aged 19 years or  older who has certain medical conditions and has not been previously immunized should receive 1 dose of PCV13 vaccine. This PCV13 should be followed with a dose of pneumococcal polysaccharide (PPSV23) vaccine. The PPSV23 vaccine dose should be obtained at least 8 weeks after the dose of PCV13 vaccine. An adult aged 19 years or older who has certain medical conditions and previously received 1 or more doses of PPSV23 vaccine should receive 1 dose of PCV13. The PCV13 vaccine dose should be obtained 1 or more years after the last PPSV23 vaccine dose.  Pneumococcal polysaccharide (PPSV23) vaccine. When PCV13 is also indicated, PCV13 should be obtained first. All adults aged 65 years and older should be immunized. An adult younger than age 65 years who has certain medical conditions should be immunized. Any person who resides in a nursing home or long-term care facility should be   immunized. An adult smoker should be immunized. People with an immunocompromised condition and certain other conditions should receive both PCV13 and PPSV23 vaccines. People with human immunodeficiency virus (HIV) infection should be immunized as soon as possible after diagnosis. Immunization during chemotherapy or radiation therapy should be avoided. Routine use of PPSV23 vaccine is not recommended for American Indians, [place] Natives, or people younger than 65 years unless there are medical conditions that require PPSV23 vaccine. When indicated, people who have unknown immunization and have no record of immunization should receive PPSV23 vaccine. One-time revaccination 5 years after the first dose of PPSV23 is recommended for people aged 19 64 years who have chronic kidney failure, nephrotic syndrome, asplenia, or immunocompromised conditions. People who received 1 2 doses of PPSV23 before age 65 years should receive another dose of PPSV23 vaccine at age 65 years or later if at least 5 years have passed since the previous dose. Doses of  PPSV23 are not needed for people immunized with PPSV23 at or after age 65 years.  Meningococcal vaccine. Adults with asplenia or persistent complement component deficiencies should receive 2 doses of quadrivalent meningococcal conjugate (MenACWY-D) vaccine. The doses should be obtained at least 2 months apart. Microbiologists working with certain meningococcal bacteria, military recruits, people at risk during an outbreak, and people who travel to or live in countries with a high rate of meningitis should be immunized. A first-year college student up through age 21 years who is living in a residence hall should receive a dose if he did not receive a dose on or after his 16th birthday. Adults who have certain high-risk conditions should receive one or more doses of vaccine.  Hepatitis A vaccine. Adults who wish to be protected from this disease, have certain high-risk conditions, work with hepatitis A-infected animals, work in hepatitis A research labs, or travel to or work in countries with a high rate of hepatitis A should be immunized. Adults who were previously unvaccinated and who anticipate close contact with an international adoptee during the first 60 days after arrival in the [place] from a country with a high rate of hepatitis A should be immunized.  Hepatitis B vaccine. Adults who wish to be protected from this disease, have certain high-risk conditions, may be exposed to blood or other infectious body fluids, are household contacts or sex partners of hepatitis B positive people, are clients or workers in certain care facilities, or travel to or work in countries with a high rate of hepatitis B should be immunized.  Haemophilus influenzae type b (Hib) vaccine. A previously unvaccinated person with asplenia or sickle cell disease or having a scheduled splenectomy should receive 1 dose of Hib vaccine. Regardless of previous immunization, a recipient of a hematopoietic stem cell transplant  should receive a 3-dose series 6 12 months after his successful transplant. Hib vaccine is not recommended for adults with HIV infection. Preventive Service / Frequency Ages 19 to 39  Blood pressure check.** / Every 1 to 2 years.  Lipid and cholesterol check.** / Every 5 years beginning at age 20.  Hepatitis C blood test.** / For any individual with known risks for hepatitis C.  Skin self-exam. / Monthly.  Influenza vaccine. / Every year.  Tetanus, diphtheria, and acellular pertussis (Tdap, Td) vaccine.** / Consult your caregiver. 1 dose of Td every 10 years.  Varicella vaccine.** / Consult your caregiver.  HPV vaccine. / 3 doses over 6 months, if 26 and younger.  Measles, mumps, rubella (MMR) vaccine.** /   You need at least 1 dose of MMR if you were born in 1957 or later. You may also need a 2nd dose.  Pneumococcal 13-valent conjugate (PCV13) vaccine.** / Consult your caregiver.  Pneumococcal polysaccharide (PPSV23) vaccine.** / 1 to 2 doses if you smoke cigarettes or if you have certain conditions.  Meningococcal vaccine.** / 1 dose if you are age 19 to 21 years and a first-year college student living in a residence hall, or have one of several medical conditions, you need to get vaccinated against meningococcal disease. You may also need additional booster doses.  Hepatitis A vaccine.** / Consult your caregiver.  Hepatitis B vaccine.** / Consult your caregiver.  Haemophilus influenzae type b (Hib) vaccine.** / Consult your caregiver. Ages 40 to 64  Blood pressure check.** / Every 1 to 2 years.  Lipid and cholesterol check.** / Every 5 years beginning at age 20.  Lung cancer screening. / Every year if you are aged 55 80 years and have a 30-pack-year history of smoking and currently smoke or have quit within the past 15 years. Yearly screening is stopped once you have quit smoking for at least 15 years or develop a health problem that would prevent you from having lung cancer  treatment.  Fecal occult blood test (FOBT) of stool. / Every year beginning at age 50 and continuing until age 75. You may not have to do this test if you get colonoscopy every 10 years.  Flexible sigmoidoscopy** or colonoscopy.** / Every 5 years for a flexible sigmoidoscopy or every 10 years for a colonoscopy beginning at age 50 and continuing until age 75.  Hepatitis C blood test.** / For all people born from 1945 through 1965 and any individual with known risks for hepatitis C.  Skin self-exam. / Monthly.  Influenza vaccine. / Every year.  Tetanus, diphtheria, and acellular pertussis (Tdap/Td) vaccine.** / Consult your caregiver. 1 dose of Td every 10 years.  Varicella vaccine.** / Consult your caregiver.  Zoster vaccine.** / 1 dose for adults aged 60 years or older.  Measles, mumps, rubella (MMR) vaccine.** / You need at least 1 dose of MMR if you were born in 1957 or later. You may also need a 2nd dose.  Pneumococcal 13-valent conjugate (PCV13) vaccine.** / Consult your caregiver.  Pneumococcal polysaccharide (PPSV23) vaccine.** / 1 to 2 doses if you smoke cigarettes or if you have certain conditions.  Meningococcal vaccine.** / Consult your caregiver.  Hepatitis A vaccine.** / Consult your caregiver.  Hepatitis B vaccine.** / Consult your caregiver.  Haemophilus influenzae type b (Hib) vaccine.** / Consult your caregiver. Ages 65 and over  Blood pressure check.** / Every 1 to 2 years.  Lipid and cholesterol check.**/ Every 5 years beginning at age 20.  Lung cancer screening. / Every year if you are aged 55 80 years and have a 30-pack-year history of smoking and currently smoke or have quit within the past 15 years. Yearly screening is stopped once you have quit smoking for at least 15 years or develop a health problem that would prevent you from having lung cancer treatment.  Fecal occult blood test (FOBT) of stool. / Every year beginning at age 50 and continuing until  age 75. You may not have to do this test if you get colonoscopy every 10 years.  Flexible sigmoidoscopy** or colonoscopy.** / Every 5 years for a flexible sigmoidoscopy or every 10 years for a colonoscopy beginning at age 50 and continuing until age 75.  Hepatitis C blood   test.** / For all people born from 1945 through 1965 and any individual with known risks for hepatitis C.  Abdominal aortic aneurysm (AAA) screening.** / A one-time screening for ages 65 to 75 years who are current or former smokers.  Skin self-exam. / Monthly.  Influenza vaccine. / Every year.  Tetanus, diphtheria, and acellular pertussis (Tdap/Td) vaccine.** / 1 dose of Td every 10 years.  Varicella vaccine.** / Consult your caregiver.  Zoster vaccine.** / 1 dose for adults aged 60 years or older.  Pneumococcal 13-valent conjugate (PCV13) vaccine.** / Consult your caregiver.  Pneumococcal polysaccharide (PPSV23) vaccine.** / 1 dose for all adults aged 65 years and older.  Meningococcal vaccine.** / Consult your caregiver.  Hepatitis A vaccine.** / Consult your caregiver.  Hepatitis B vaccine.** / Consult your caregiver.  Haemophilus influenzae type b (Hib) vaccine.** / Consult your caregiver. **Family history and personal history of risk and conditions may change your caregiver's recommendations. Document Released: 02/24/2001 Document Revised: 04/25/2012 Document Reviewed: 05/26/2010 [employer] Patient Information 2014 [employer].  

## 2012-12-12 NOTE — Progress Notes (Signed)
Pre visit review using our clinic review tool, if applicable. No additional management support is needed unless otherwise documented below in the visit note. 

## 2012-12-12 NOTE — Progress Notes (Signed)
Subjective:    Patient ID: Larry Riley, male    DOB: [birth year], 32 y.o.   MRN: [id number]  HPI Pt here for cpe and labs.  Pt is having some back pain with neurofibromatosis.  He has a f/u with NS in January.   Review of Systems Review of Systems  Constitutional: Negative for activity change, appetite change and fatigue.  HENT: Negative for hearing loss, congestion, tinnitus and ear discharge.  dentist q56m Eyes: Negative for visual disturbance (see optho q1y -- vision corrected to 20/20 with glasses).  Respiratory: Negative for cough, chest tightness and shortness of breath.   Cardiovascular: Negative for chest pain, palpitations and leg swelling.  Gastrointestinal: Negative for abdominal pain, diarrhea, constipation and abdominal distention.  Genitourinary: Negative for urgency, frequency, decreased urine volume and difficulty urinating.  Musculoskeletal: Negative for back pain, arthralgias and gait problem.  Skin: Negative for color change, pallor and rash.  Neurological: Negative for dizziness, light-headedness, numbness and headaches.  Hematological: Negative for adenopathy. Does not bruise/bleed easily.  Psychiatric/Behavioral: Negative for suicidal ideas, confusion, sleep disturbance, self-injury, dysphoric mood, decreased concentration and agitation.     Past Medical History  Diagnosis Date  . Neurofibromatosis     tumor down spine and brain  . MRSA (methicillin resistant Staphylococcus aureus) 2005    leg   History   Social History  . Marital Status: Single    Spouse Name: N/A    Number of Children: N/A  . Years of Education: N/A   Occupational History  . [employer]    Social History Main Topics  . Smoking status: Never Smoker   . Smokeless tobacco: Never Used  . Alcohol Use: Yes     Comment: very rare  . Drug Use: No  . Sexual Activity: No   Other Topics Concern  . Not on file   Social History Narrative   Exercise-- no   Family History    Problem Relation Age of Onset  . Prostate cancer    . Diabetes Mother   . Arthritis Father   . Hypertension Father   . Arthritis Paternal Grandmother   . Arthritis Paternal Grandfather   . Diabetes Paternal Grandfather   . Hyperlipidemia Paternal Grandfather   . Hypertension Paternal Grandfather    Current outpatient prescriptions:metaxalone (SKELAXIN) 800 MG tablet, Take 1 tablet (800 mg total) by mouth 4 (four) times daily., Disp: 60 tablet, Rfl: 1;  pantoprazole (PROTONIX) 40 MG tablet, Take 1 tablet (40 mg total) by mouth daily., Disp: 90 tablet, Rfl: 3;  traMADol (ULTRAM) 50 MG tablet, Take 1 tablet (50 mg total) by mouth every 8 (eight) hours as needed., Disp: 60 tablet, Rfl: 2    Objective:   Physical Exam BP 108/76  Pulse 81  Temp(Src) 99.2 F (37.3 C) (Oral)  Ht 5\' 9"  (1.753 m)  Wt 180 lb (81.647 kg)  BMI 26.57 kg/m2  SpO2 97% General appearance: alert, cooperative, appears stated age and no distress Head: Normocephalic, without obvious abnormality, atraumatic Eyes: conjunctivae/corneas clear. PERRL, EOM's intact. Fundi benign. Ears: normal TM's and external ear canals both ears Nose: Nares normal. Septum midline. Mucosa normal. No drainage or sinus tenderness. Throat: lips, mucosa, and tongue normal; teeth and gums normal Neck: no adenopathy, no carotid bruit, no JVD, supple, symmetrical, trachea midline and thyroid not enlarged, symmetric, no tenderness/mass/nodules Back: symmetric, no curvature. ROM normal. No CVA tenderness. Lungs: clear to auscultation bilaterally Chest wall: no tenderness Heart: regular rate and rhythm, S1, S2 normal, no  murmur, click, rub or gallop Abdomen: soft, non-tender; bowel sounds normal; no masses,  no organomegaly Male genitalia: normal, penis: no lesions or discharge. testes: no masses or tenderness. no hernias Rectal: deferred Extremities: extremities normal, atraumatic, no cyanosis or edema Pulses: 2+ and symmetric Skin: Skin  color, texture, turgor normal. No rashes or lesions Lymph nodes: Cervical, supraclavicular, and axillary nodes normal. Neurologic: Alert and oriented X 3, normal strength and tone. Normal symmetric reflexes. Normal coordination and gait Psych-- no depression, no anxiety      Assessment & Plan:  cpe-- check labs           ghm utd           rto 1 year or sooner prn

## 2012-12-13 LAB — URINE CULTURE: Organism ID, Bacteria: NO GROWTH

## 2013-02-12 HISTORY — PX: VASECTOMY: SHX75

## 2013-08-10 ENCOUNTER — Telehealth: Payer: Self-pay | Admitting: Family Medicine

## 2013-08-10 ENCOUNTER — Other Ambulatory Visit: Payer: Self-pay | Admitting: Family Medicine

## 2013-08-10 DIAGNOSIS — Q8502 Neurofibromatosis, type 2: Secondary | ICD-10-CM

## 2013-08-10 NOTE — Telephone Encounter (Signed)
Ok to put referral in 

## 2013-08-10 NOTE — Telephone Encounter (Signed)
Referral placed.  Detailed message on patient's phone making him aware that referral had been placed.  He was encouraged to call back with questions or concerns.

## 2013-08-10 NOTE — Telephone Encounter (Signed)
Please advise 

## 2013-08-10 NOTE — Telephone Encounter (Signed)
Caller name:Zymir Relation to ZO:XWRUpt:self Call back [phone] Pharmacy:  Reason for call: pt would like another referral to Dr. Franky Machoabbell, [facility]. He said he went to Urgent Care yesterday and was advised to go back to Neurology for his NEUROFIBROMATOSIS, TYPE 2

## 2013-08-23 ENCOUNTER — Other Ambulatory Visit: Payer: Self-pay | Admitting: Neurosurgery

## 2013-08-23 DIAGNOSIS — Q8500 Neurofibromatosis, unspecified: Secondary | ICD-10-CM

## 2013-08-25 ENCOUNTER — Encounter (INDEPENDENT_AMBULATORY_CARE_PROVIDER_SITE_OTHER): Payer: Self-pay

## 2013-08-25 ENCOUNTER — Ambulatory Visit
Admission: RE | Admit: 2013-08-25 | Discharge: 2013-08-25 | Disposition: A | Discharge status: Home / Self Care | Payer: 59 | Source: Ambulatory Visit | Attending: Neurosurgery | Admitting: Neurosurgery

## 2013-08-25 DIAGNOSIS — Q8500 Neurofibromatosis, unspecified: Secondary | ICD-10-CM

## 2013-09-21 DIAGNOSIS — G959 Disease of spinal cord, unspecified: Secondary | ICD-10-CM | POA: Insufficient documentation

## 2013-10-12 HISTORY — PX: SPINE SURGERY: SHX786

## 2013-11-21 DIAGNOSIS — Z981 Arthrodesis status: Secondary | ICD-10-CM | POA: Insufficient documentation

## 2013-11-22 ENCOUNTER — Ambulatory Visit: Payer: 59 | Attending: Neurosurgery | Admitting: Physical Therapy

## 2013-11-22 DIAGNOSIS — M542 Cervicalgia: Secondary | ICD-10-CM | POA: Diagnosis not present

## 2013-11-22 DIAGNOSIS — Z9889 Other specified postprocedural states: Secondary | ICD-10-CM | POA: Insufficient documentation

## 2013-11-23 ENCOUNTER — Ambulatory Visit: Payer: 59 | Admitting: Physical Therapy

## 2013-11-23 DIAGNOSIS — Z9889 Other specified postprocedural states: Secondary | ICD-10-CM | POA: Diagnosis not present

## 2013-11-23 DIAGNOSIS — M25511 Pain in right shoulder: Secondary | ICD-10-CM

## 2013-11-23 DIAGNOSIS — M542 Cervicalgia: Secondary | ICD-10-CM

## 2013-11-23 DIAGNOSIS — R279 Unspecified lack of coordination: Secondary | ICD-10-CM

## 2013-11-23 DIAGNOSIS — R269 Unspecified abnormalities of gait and mobility: Secondary | ICD-10-CM

## 2013-11-23 NOTE — Therapy (Signed)
Physical Therapy Treatment  Patient Details  Name: Larry Riley MRN: [id number] Date of Birth: Oct 12, 1980  Encounter Date: 11/23/2013      PT End of Session - 11/23/13 1207    Visit Number 2   Number of Visits 24   PT Start Time 1100   PT Stop Time 1150   PT Time Calculation (min) 50 min   Activity Tolerance Patient tolerated treatment well      Past Medical History  Diagnosis Date  . Neurofibromatosis     tumor down spine and brain  . MRSA (methicillin resistant Staphylococcus aureus) 2005    leg    Past Surgical History  Procedure Laterality Date  . Neck surgery       tumor removal  . Hydrocephalus      [place] 1999[place]    There were no vitals taken for this visit.  Visit Diagnosis:  Abnormality of gait  Pain in joint, shoulder region, right  Cervicalgia  Lack of coordination      Subjective Assessment - 11/23/13 1128    Symptoms Starting to get more sensation in UE's; best handwriting he's had in years; no radiating pain in hands and feet; can feel feet and hands light touch/pain   Pertinent History von Reckinhausens disease;    Limitations Walking   Currently in Pain? Yes   Pain Score 6    Pain Location Neck   Pain Orientation Posterior   Pain Descriptors / Indicators Aching   Pain Type Surgical pain   Pain Frequency Constant   Multiple Pain Sites Yes   Pain Score 6   Pain Type Surgical pain   Pain Location Shoulder   Pain Orientation Right            [facility] Adult PT Treatment/Exercise - 11/23/13 0001    Transfers   Transfers Sit to Stand  slow controlled mvt x 5 without UE support   Posture/Postural Control   Posture/Postural Control --  standing  progressivley decresed base of support   Exercises   Exercises Neck   Neck Exercises: Seated   Cervical Isometrics Flexion;5 secs;5 reps  ext/lateral rotation; side bend isos in neutral          PT Education - 11/23/13 1206    Education provided Yes   Education Details discussed  chronic vs acute pain and proper intensity of ther ex with all ex's moving forward   Person(s) Educated Patient;Spouse   Methods Explanation;Demonstration;Handout   Comprehension Returned demonstration;Verbalized understanding          PT Short Term Goals - 11/23/13 1211    PT SHORT TERM GOAL #1   Title be independent with initial HEP for lower extremity strengthening and balance training   Time 30   Period Days   Status New   PT SHORT TERM GOAL #2   Title timed up and go score <=23 seconds   Time 30   Period Days   Status New   PT SHORT TERM GOAL #3   Title ambulate with a cane with minimal deficits   Time 30   Period Days   Status New   PT SHORT TERM GOAL #4   Title perfome home activities with >25% greater ease   Time 30   Period Days   Status New          PT Long Term Goals - 11/23/13 1213    PT LONG TERM GOAL #1   Title demonstrate and /or verbalized techniques to redue the  risk of re-inury to inclued info on : physical activity   Time 60   Period Days   Status New   PT LONG TERM GOAL #2   Title be independent with advanced HEP for strengthening   Time 60   Period Days   Status New   PT LONG TERM GOAL #3   Title timed up and go score <15 seconds   Time 60   Period Days   Status New   PT LONG TERM GOAL #4   Title return to work due to increased bilateral lower extremity and increased endurance   Time 60   Period Days   Status New   PT LONG TERM GOAL #5   Title perfomr home activities with >60% greater ease   Time 60   Period Days   Additional Long Term Goals   Additional Long Term Goals Yes   PT LONG TERM GOAL #6   Title ability to step onto a curb due to increased bilateral lower extremity strength   Time 60   Period Days   Status New          Plan - 11/23/13 1208    Clinical Impression Statement Patient is reporting significant return of neuro function; has s/s of chronic pain and needs to work within limtis w/o exacerbating pain;  exciting to see how much better his is that reported history; he should gain in strength and function significantly in coming weeks/months   Pt will benefit from skilled therapeutic intervention in order to improve on the following deficits Abnormal gait;Decreased coordination;Pain;Difficulty walking;Decreased balance;Decreased strength   Rehab Potential Good   PT Frequency 3x / week   PT Duration 8 weeks   PT Next Visit Plan review effectivenss of HEP and augment as needed; progress balance ex's   PT Home Exercise Plan cervical isos; sit to stand transfers;    Recommended Other Services OT   Consulted and Agree with Plan of Care Patient        Problem List Patient Active Problem List   Diagnosis Date Noted  . Preventative health care 06/23/2010  . NEUROFIBROMATOSIS, TYPE 2 04/12/2009  . HORNER'S SYNDROME 04/12/2009                                              Vashti HeyShoup, Larry D 11/23/2013, 12:20 PM

## 2013-11-23 NOTE — Patient Instructions (Addendum)
Strengthening: Flexion - Isometric (in Neutral)   Using light pressure from fingertips at forehead, resist bending head forward. Hold ____ seconds. Repeat ____ times per set. Do ____ sets per session. Do ____ sessions per day.  http://orth.exer.us/306   Copyright  VHI. All rights reserved.  Strengthening: Extension - Isometric (Out of Neutral)   Bend head backward. Apply light pressure to back of head with fingertips and resist bending head further backward. Hold ____ seconds. Repeat ____ times per set. Do ____ sets per session. Do ____ sessions per day.  http://orth.exer.us/322   Copyright  VHI. All rights reserved.  Strengthening: Rotation - Isometric (in Neutral)   Using light pressure from fingertips at right temple, resist turning head. Hold ____ seconds. Repeat ____ times per set. Do ____ sets per session. Do ____ sessions per day.  http://orth.exer.us/304   Copyright  VHI. All rights reserved.  Strengthening: Lateral Bend - Isometric (in Neutral)   Using light pressure from fingertips, press into right temple. Resist bending head sideways. Hold ____ seconds. Repeat ____ times per set. Do ____ sets per session. Do ____ sessions per day.  http://orth.exer.us/302   Copyright  VHI. All rights reserved.    Sit to stand x 5  Focus on slow controlled mvt w/o UE support ascend/descend; progress from wide to narrow base of support; recommended doing after shows on TV or during commercial breaks

## 2013-11-29 ENCOUNTER — Encounter: Payer: Self-pay | Admitting: Occupational Therapy

## 2013-11-29 ENCOUNTER — Ambulatory Visit: Payer: 59 | Admitting: Occupational Therapy

## 2013-11-29 ENCOUNTER — Encounter: Payer: Self-pay | Admitting: Physical Therapy

## 2013-11-29 ENCOUNTER — Ambulatory Visit: Payer: 59 | Admitting: Physical Therapy

## 2013-11-29 DIAGNOSIS — R279 Unspecified lack of coordination: Secondary | ICD-10-CM

## 2013-11-29 DIAGNOSIS — R278 Other lack of coordination: Secondary | ICD-10-CM

## 2013-11-29 DIAGNOSIS — G8250 Quadriplegia, unspecified: Secondary | ICD-10-CM

## 2013-11-29 DIAGNOSIS — R269 Unspecified abnormalities of gait and mobility: Secondary | ICD-10-CM

## 2013-11-29 DIAGNOSIS — M542 Cervicalgia: Secondary | ICD-10-CM

## 2013-11-29 DIAGNOSIS — M25511 Pain in right shoulder: Secondary | ICD-10-CM

## 2013-11-29 DIAGNOSIS — Z9889 Other specified postprocedural states: Secondary | ICD-10-CM | POA: Diagnosis not present

## 2013-11-29 NOTE — Therapy (Signed)
Occupational Therapy Evaluation  Patient Details  Name: Larry Riley MRN: [id number] Date of Birth: 01/10/1981  Encounter Date: 11/29/2013      OT End of Session - 11/29/13 1213    Visit Number 1   Number of Visits 16   Date for OT Re-Evaluation 01/25/14   OT Start Time 1115   OT Stop Time 1200   OT Time Calculation (min) 45 min   Activity Tolerance Patient tolerated treatment well      Past Medical History  Diagnosis Date  . Neurofibromatosis     tumor down spine and brain  . MRSA (methicillin resistant Staphylococcus aureus) 2005    leg    Past Surgical History  Procedure Laterality Date  . Neck surgery       tumor removal  . Hydrocephalus      [place] 1999[place]    There were no vitals taken for this visit.  Visit Diagnosis:  Tetraparesis  Decreased coordination      Subjective Assessment - 11/29/13 1125    Symptoms "I had pain when I got up this morning."   Currently in Pain? Yes   Pain Score 6   8/10 this morning prior to pain meds   Pain Location Shoulder   Pain Orientation --  both   Pain Type Acute pain  from activity   Pain Onset More than a month ago   Aggravating Factors  activity   Pain Relieving Factors rest, pain meds   Multiple Pain Sites Yes   Pain Score 6   Pain Location Neck   Pain Orientation Posterior   Pain Descriptors / Indicators Aching  rest and pain meds help, increased activity aggravates   Pain Frequency Constant          [facility] OT Assessment - 11/29/13 1133    Assessment   Diagnosis cervical fusion due to tumors   Onset Date 11/10/13   Prior Therapy Pt seen at [facility] by PT then referred to Neuro Rehab   Precautions   Precautions Cervical  No lifting anything overhead, no lifting more than 10 lbs.   Precaution Comments Pt is able to raise arms overhead but is NOT allowed to lift anything overhead   Balance Screen   Has the patient fallen in the past 6 months Yes   Has the patient had a decrease in activity  level because of a fear of falling?  --  from fainting, wife assisted pt to ground, no injuries   Home  Environment   Type of Home House   Bathroom Shower/Tub Tub/Shower unit  transfer tub bench, hand held shower   Prior Function   Level of Independence Independent with basic ADLs;Independent with homemaking with ambulation   Vocation Full time employment  Building surveyor   ADL   Eating/Feeding Modified independent   Eating/Feeding Patient Percentage --  has to alter grasp to use utensils,    Grooming Modified independent   Upper Body Bathing Supervision/safety   Lower Body Bathing Supervision/safety   Upper Body Dressing Minimal assistance   Lower Body Dressing Minimal assistance   Toilet Tranfer Modified independent   Toileting - Clothing Manipulation Modified independent   Toileting -  Metallurgist bench   ADL comments increased time   IADL   Shopping Completely unable to shop   Light Housekeeping Does not participate in any housekeeping tasks   Meal Prep Needs to have meals  prepared and served   [employer] Relies on family or friends for transportation   Medication Management Is responsible for taking medication in correct dosages at correct time   Mobility   Mobility Status Comments walks intermittently with walker,   walks mod I in house with RW, with supervision in community    Written Expression   Dominant Hand Right   Handwriting 90% legible   Activity Tolerance   Activity Tolerance --  tolerates 10-15 minutes of activity   Cognition   Overall Cognitive Status Within Functional Limits for tasks assessed   Sensation   Light Touch Appears Intact   Hot/Cold Appears Intact   Proprioception Appears Intact   Coordination   9 Hole Peg Test (p) R= 51.78  L= 41.78   Box and Blocks R= 43 L NT   Right 9 Hole Peg Test 54.97   Left 9 Hole Peg Test 43.35    Tone   Assessment Location --   BUE"s WFL's   AROM   Overall AROM  Within functional limits for tasks performed   Hand Function   Comments R=65 pounds, L=55 pounds              OT Short Term Goals - 11/29/13 1221    OT SHORT TERM GOAL #1   Title Pt will be I with HEP - 12/27/2013   Time 4   Period Weeks   Status New   OT SHORT TERM GOAL #2   Title Pt will demonstrate improved coordination as evidenced by decreasing 9  hole peg test combined by 10 seconds to aide in functional tasks   Time 4   Period Weeks   Status New   OT SHORT TERM GOAL #3   Title Pateint will be mod I with butttoning    Time 4   Period Weeks   Status New   OT SHORT TERM GOAL #4   Title Pt will be mod I with tying shoes with AE prn   Time 4   Period Weeks   Status New          OT Long Term Goals - 11/29/13 1223    OT LONG TERM GOAL #1   Title Pt will be I with upgraded HEP prn - 01/25/2014   Time 8   Period Weeks   Status New   OT LONG TERM GOAL #2   Title Pt will demonstrate improved coordination by decreasing 9 hole peg test combined by 20 seconds to assist wtih functional activity   Time 8   Period Weeks   Status New   OT LONG TERM GOAL #3   Title Patient will be I with laundry   Time 8   Period Weeks   Status New   OT LONG TERM GOAL #4   Title Patient will be able to type with both hands within reasonable period of time in preparation to returning to work.   Time 8   Period Weeks   Status New   OT LONG TERM GOAL #5   Title Patient will be I with  meal prep at ambulatory level   Time 8   Period Weeks   Status New          Plan - 11/29/13 1214    Clinical Impression Statement Patient is a 33 year old male diagnosed at young age with neurofibromatosis, Larry Riley, and hydrocephalus.  Patient has had numerous cervical surgeries over the years.  Pt with posterior cervical laminanctomy  and fusion on 10/30/215 with resultant quadraparesis.  Paient was hospitalized for 5  days.  Patient with the following impairments:  decreasd bilateral coordination, decrased UE strength, decreased UE functional use, decreased activity tolerance all which impede ability for basic and IADL as well as return to work.   Rehab Potential Good   Clinical Impairments Affecting Rehab Potential Pt will beneft form skilled OT to address the following:  decreased bilateral coordination, decreased funcitonal use of BUE's, decreased strength BUE's   OT Frequency 2x / week   OT Duration 8 weeks   OT Treatment/Interventions Self-care/ADL training;Moist Heat;Neuromuscular education;Therapeutic exercise;DME and/or AE instruction;Functional Mobility Training;Manual Therapy;Therapeutic activities;Patient/family education;Balance training   Plan intiate HEP for coordination        Problem List Patient Active Problem List   Diagnosis Date Noted  . Preventative health care 06/23/2010  . NEUROFIBROMATOSIS, TYPE 2 04/12/2009  . HORNER'S Riley 04/12/2009                                             Mackie PaiKaren Pulaski, MS, OTR/L [facility] [address], [address] [place], [place] [address] [phone] Phone [phone] Fax  Norton Pastelulaski, Karen Halliday 11/29/2013, 12:33 PM    Physician:Delia Sumner BoastAnn Kleinhenz, NP  Certification Start Date:11/29/2013 Certification End Date: 01/25/2014  Physician Documentation Your signature is required to indicate approval of the treatment plan as stated above.  Please sign and either send electronically or make a copy of this report for your files and return this physician signed original.  Please mark one 1.__approve of plan   2. ___approve of plan with the followingconditions. ____________________________________________________________________________________________________________________________________________   ______________________                                                        _____________________ Physician Signature                                                                     Date    Faxed to MD for signature

## 2013-11-29 NOTE — Therapy (Signed)
Physical Therapy Treatment  Patient Details  Name: Larry Riley MRN: [id number] Date of Birth: 1980/09/10  Encounter Date: 11/29/2013      PT End of Session - 11/29/13 1307    Visit Number 3   Number of Visits 24   Date for PT Re-Evaluation 01/12/14   PT Start Time 1200   PT Stop Time 1254   PT Time Calculation (min) 54 min   Equipment Utilized During Treatment Gait belt   Activity Tolerance Patient tolerated treatment well   Behavior During Therapy WFL for tasks assessed/performed      Past Medical History  Diagnosis Date  . Neurofibromatosis     tumor down spine and brain  . MRSA (methicillin resistant Staphylococcus aureus) 2005    leg    Past Surgical History  Procedure Laterality Date  . Neck surgery       tumor removal  . Hydrocephalus      [place] 1999[place]    There were no vitals taken for this visit.  Visit Diagnosis:  Tetraparesis  Abnormality of gait  Decreased coordination  Cervicalgia  Pain in joint, shoulder region, right      Subjective Assessment - 11/29/13 1220    Pain Score 6    Pain Location Shoulder   Pain Orientation Right;Left   Pain Descriptors / Indicators Aching   Pain Type Acute pain   Pain Onset More than a month ago   Aggravating Factors  activity   Pain Relieving Factors rest, medication (pain, muscle relaxer)            [facility] Adult PT Treatment/Exercise - 11/29/13 1200    Transfers   Transfers Sit to Stand;Stand to Sit   Sit to Stand 5: Supervision   Sit to Stand Details (indicate cue type and reason) cues on abdominal contraction prior to stand   Stand to Sit 5: Supervision   Stand to Sit Details cues to bend at waist and knee prior to reaching for armrests for safety   Ambulation/Gait   Ambulation/Gait Yes   Ambulation/Gait Assistance 4: Min assist   Ambulation/Gait Assistance Details required minimal assistance for trunk postural control while ambulating with no assistive device   Ambulation Distance  (Feet) 355 Feet   Assistive device None  gait belt used for safety   Gait Pattern Step-through pattern;Decreased stance time - right;Decreased dorsiflexion - left  foot drop left, variable foot placement left   Gait velocity decreased   Posture/Postural Control   Posture Comments stands with knees locked, hips forward, and minor posterior trunk lean   Balance   Balance Assessed Yes   Static Standing Balance   Static Standing - Balance Support No upper extremity supported   Static Standing - Level of Assistance --  min guard   Static Standing - Comment/# of Minutes 5; minor postural sway, limited by fatigue and pain   Neck Exercises: Seated   Cervical Isometrics Flexion;Extension;Right lateral flexion;Left lateral flexion;Right rotation;Left rotation;5 secs;1 rep;Other (comment)  all performed in neutral position.    Other Seated Exercise ab exercise: holding balloon with one hand, blow up balloon w/ 3 breaths (no back support). Alternate hand          PT Education - 11/29/13 1213    Education provided Yes   Education Details Ergonomics of laptop and keyboard when working on computer. Discussed monitor, keyboard, and chair height. Discussed energy conservation and gradual increase in occupational work time. Walking at home with walker to reduce fall risk, walking outside  with wife present. (+) abdominal contraction during sit<>stand, (+) balloon exercise.    Person(s) Educated Patient, wife   Methods Explanation   Comprehension Verbalized understanding          PT Short Term Goals - 11/29/13 1200    PT SHORT TERM GOAL #1   Title be independent with initial HEP for lower extremity strengthening and balance training (12/15/13)   Time 30   Period Days   Status New   PT SHORT TERM GOAL #2   Title timed up and go score <=23 seconds (12/15/13)   Time 30   Period Days   Status New   PT SHORT TERM GOAL #3   Title ambulate with a cane with minimal  (12/15/13)   Time 30   Period  Days   Status New   PT SHORT TERM GOAL #4   Title perfome home activities with >25% greater ease (12/15/13)   Time 30   Period Days   Status New          PT Long Term Goals - 11/29/13 1200    PT LONG TERM GOAL #1   Title demonstrate and /or verbalized techniques to redue the risk of re-inury to inclued info on : physical activity (01/12/14)   Time 60   Period Days   Status New   PT LONG TERM GOAL #2   Title be independent with advanced HEP for strengthening (01/12/14)   Time 60   Period Days   Status New   PT LONG TERM GOAL #3   Title timed up and go score <15 seconds (01/12/14)   Time 60   Period Days   Status New   PT LONG TERM GOAL #4   Title return to work due to increased bilateral lower extremity and increased endurance (01/12/14)   Time 60   Period Days   Status New   PT LONG TERM GOAL #5   Title perfomr home activities with >60% greater ease   Time 60   Period Days   PT LONG TERM GOAL #6   Title ability to step onto a curb due to increased bilateral lower extremity strength   Time 60   Period Days   Status New          Plan - 11/29/13 1325    Clinical Impression Statement Patient demonstrated ability to perform home exercises with minimal cues on frequency, energy conservation, and exercising in tolerable pain range. Patient describes increase in strength in endurance each day but is still fall risk with no assistive device due to gait abnormalities.    Pt will benefit from skilled therapeutic intervention in order to improve on the following deficits Abnormal gait;Decreased coordination;Pain;Difficulty walking;Decreased balance;Decreased strength;Decreased range of motion;Decreased activity tolerance;Decreased endurance   Rehab Potential Good   PT Frequency 3x / week   PT Duration 8 weeks   PT Treatment/Interventions ADLs/Self Care Home Management;Therapeutic activities;Therapeutic exercise;Gait training;Balance training;Stair training;Neuromuscular  re-education;Energy conservation;Functional mobility training   PT Next Visit Plan Reassess HEP compliance, reassess gait and formulate treatment based on deviations. Balance training.    PT Home Exercise Plan Cerival isometrics; sit<>stand (+) abdominal contraction, abdominal (balloon exercise)   Consulted and Agree with Plan of Care Patient;Family member/caregiver        Problem List Patient Active Problem List   Diagnosis Date Noted  . Preventative health care 06/23/2010  . NEUROFIBROMATOSIS, TYPE 2 04/12/2009  . HORNER'S SYNDROME 04/12/2009      All PT delivered under  supervision of Licensed Physical Therapist.   Blanche EastShanklin, Jadi Deyarmin, Student PT 11/29/2013, 1:31 PM  This entire session was performed under direct supervision and direction of a licensed therapist/therapist assistant . I have personally read, edited and approve of the note as written.  Vladimir Fasterobin Waldron, PT, DPT Physical Therapist Specializing in Prosthetics & Limb Loss Care Phone: [phone] FAX: [phone] [address] [place], [place] [address]

## 2013-12-01 ENCOUNTER — Ambulatory Visit: Payer: 59 | Admitting: Physical Therapy

## 2013-12-01 ENCOUNTER — Encounter: Payer: Self-pay | Admitting: Physical Therapy

## 2013-12-01 DIAGNOSIS — G8250 Quadriplegia, unspecified: Secondary | ICD-10-CM

## 2013-12-01 DIAGNOSIS — R278 Other lack of coordination: Secondary | ICD-10-CM

## 2013-12-01 DIAGNOSIS — R269 Unspecified abnormalities of gait and mobility: Secondary | ICD-10-CM

## 2013-12-01 DIAGNOSIS — R279 Unspecified lack of coordination: Secondary | ICD-10-CM

## 2013-12-01 DIAGNOSIS — Z9889 Other specified postprocedural states: Secondary | ICD-10-CM | POA: Diagnosis not present

## 2013-12-01 NOTE — Therapy (Signed)
Physical Therapy Treatment  Patient Details  Name: Larry HammondJoshua Riley MRN: [id number] Date of Birth: 03-23-1980  Encounter Date: 12/01/2013      PT End of Session - 12/01/13 1339    Visit Number 4   Number of Visits 24   Date for PT Re-Evaluation 01/12/14   PT Start Time 1232   PT Stop Time 1316   PT Time Calculation (min) 44 min   Equipment Utilized During Treatment Gait belt   Activity Tolerance Patient tolerated treatment well   Behavior During Therapy WFL for tasks assessed/performed      Past Medical History  Diagnosis Date  . Neurofibromatosis     tumor down spine and brain  . MRSA (methicillin resistant Staphylococcus aureus) 2005    leg    Past Surgical History  Procedure Laterality Date  . Neck surgery       tumor removal  . Hydrocephalus      [place] 1999[place]    There were no vitals taken for this visit.  Visit Diagnosis:  Tetraparesis  Abnormality of gait  Decreased coordination  Lack of coordination      Subjective Assessment - 12/01/13 1236    Symptoms Patient states he is feeling better everyday, tied his shoes today. Unloaded dishwasher and felt fatigued and that his pain increased, but subsided with rest.    Currently in Pain? Yes   Pain Score 7    Pain Location Back  post surgical; upper back between scapula   Pain Orientation Left;Right   Pain Descriptors / Indicators Aching   Pain Type Surgical pain   Pain Onset More than a month ago   Pain Frequency Constant   Aggravating Factors  Activity   Pain Relieving Factors rest, medication            [facility] Adult PT Treatment/Exercise - 12/01/13 1300    Transfers   Transfers Sit to Stand;Stand to Sit   Sit to Stand 5: Supervision;Without upper extremity assist;From chair/3-in-1   Sit to Stand Details (indicate cue type and reason) practiced x5 with no UE support and walker in front in case of anterior balance loss. Pt able to stand without UE support, but required initial cues on  sliding forward prior to standing and using armrests if necessary to help standing. Pt able to return demonstrate   Stand to Sit 5: Supervision;Without upper extremity assist;To chair/3-in-1   Stand to Sit Details cues to bow forward and shift waist back prior to reaching for arm rests. Pt able to return demonstrate   Ambulation/Gait   Ambulation/Gait Yes   Ambulation/Gait Assistance 5: Supervision;4: Min guard   Ambulation/Gait Assistance Details supervision with RW ambulation with cues for initial contact with heel. Pt ambulated with cane with min guard with cues on sequencing and cane placement   Ambulation Distance (Feet) 375 Feet  x1 with RW; 65 ft x1 with single point cane   Assistive device Rolling walker;Straight cane   Gait Pattern Step-through pattern;Poor foot clearance - left   Stairs Yes   Stairs Assistance 4: Min guard   Stair Management Technique One rail Right;One rail Left;Forwards;Step to pattern   Number of Stairs 4  X2 reps (1 w/ R rail, 1 w/ L rail)   Ramp 4: Min assist;Other (comment)  used RW; min guard assist for safety   Ramp Details (indicate cue type and reason) cued on step length and and sequencing   Curb 4: Min assist  used RW; min guard in case of balance  loss   Curb Details (indicate cue type and reason) cues on sequencing with min guard for safety in case of balance loss          PT Education - 12/01/13 1339    Education provided Yes   Education Details Reviewed HEP; transfer training; stair/ramp/curb navigation with RW   Person(s) Educated Patient   Methods Explanation;Demonstration   Comprehension Verbalized understanding;Returned demonstration;Need further instruction          PT Short Term Goals - 12/01/13 1300    PT SHORT TERM GOAL #1   Title be independent with initial HEP for lower extremity strengthening and balance training (12/15/13)   Time 30   Period Days   Status On-going   PT SHORT TERM GOAL #2   Title timed up and go score  <=23 seconds (12/15/13)   Time 30   Period Days   Status On-going   PT SHORT TERM GOAL #3   Title ambulate with a cane with minimal  (12/15/13)   Time 30   Period Days   Status On-going   PT SHORT TERM GOAL #4   Title perfome home activities with >25% greater ease (12/15/13)   Time 30   Period Days   Status On-going   PT SHORT TERM GOAL #5   Title Reports new surgical /muscle pain (not chronic) increases <2 increments with PT & OT activities in treatment. (Target Date: 12/15/13)   Status On-going          PT Long Term Goals - 12/01/13 1300    PT LONG TERM GOAL #1   Title demonstrate and /or verbalized techniques to redue the risk of re-inury to inclued info on : physical activity (01/12/14)   Time 60   Period Days   Status New   PT LONG TERM GOAL #2   Title be independent with advanced HEP for strengthening (01/12/14)   Time 60   Period Days   Status On-going   PT LONG TERM GOAL #3   Title timed up and go score <15 seconds (01/12/14)   Time 60   Period Days   Status On-going   PT LONG TERM GOAL #4   Title return to work due to increased bilateral lower extremity and increased endurance (01/12/14)   Time 60   Period Days   Status On-going   PT LONG TERM GOAL #5   Title perfomr home activities with >60% greater ease  (Target Date: 01/12/14)   Time 60   Period On-going   PT LONG TERM GOAL #6   Title ability to step onto a curb due to increased bilateral lower extremity strength (Target Date: 01/12/14)   Time 60   Period Days   Status On-going   PT LONG TERM GOAL #7   Title Reports new surgical /muscle pain (not chronic) increases <2 increments with work simulated activites / ADLs. (Target Date: 01/12/14) On-going            Plan - 12/01/13 1345    Clinical Impression Statement Patient ambulates with RW with minimal cues for sequencing but still has occasion L foot drop and occurence of catching toe. Pt able to ambulate with single point cane with improved execution with  practice. Patient demonstrated ability to navigate steps/ramp/curb safely outside of PT with RW assistive device.   Pt will benefit from skilled therapeutic intervention in order to improve on the following deficits Abnormal gait;Decreased coordination;Pain;Difficulty walking;Decreased balance;Decreased strength;Decreased range of motion;Decreased activity tolerance;Decreased endurance  Rehab Potential Good   PT Frequency 3x / week   PT Duration 8 weeks   PT Treatment/Interventions ADLs/Self Care Home Management;Therapeutic activities;Therapeutic exercise;Gait training;Balance training;Stair training;Neuromuscular re-education;Energy conservation;Functional mobility training   PT Next Visit Plan Work on ambulation with single point cane with decreased cues (watch for foot drop on LLE). Balance exercises for home. Train patient to navigate ramp/curb/stairs with single point cane if appropriate.   Consulted and Agree with Plan of Care Patient;Family member/caregiver        Problem List Patient Active Problem List   Diagnosis Date Noted  . Preventative health care 06/23/2010  . NEUROFIBROMATOSIS, TYPE 2 04/12/2009  . HORNER'S SYNDROME 04/12/2009       All PT delivered under supervision of Licensed Physical Environmental health practitionerTherapist Assistant.  Blanche EastShanklin, Helen Winterhalter, Student PT 12/01/2013, 1:49 PM

## 2013-12-04 ENCOUNTER — Encounter: Payer: Self-pay | Admitting: Physical Therapy

## 2013-12-04 ENCOUNTER — Ambulatory Visit: Payer: 59 | Admitting: Physical Therapy

## 2013-12-04 DIAGNOSIS — R278 Other lack of coordination: Secondary | ICD-10-CM

## 2013-12-04 DIAGNOSIS — Z9889 Other specified postprocedural states: Secondary | ICD-10-CM | POA: Diagnosis not present

## 2013-12-04 DIAGNOSIS — R279 Unspecified lack of coordination: Secondary | ICD-10-CM

## 2013-12-04 DIAGNOSIS — R269 Unspecified abnormalities of gait and mobility: Secondary | ICD-10-CM

## 2013-12-04 DIAGNOSIS — M542 Cervicalgia: Secondary | ICD-10-CM

## 2013-12-04 NOTE — Therapy (Signed)
Physical Therapy Treatment  Patient Details  Name: Larry HammondJoshua Lanzo MRN: [id number] Date of Birth: Jan 16, 1980  Encounter Date: 12/04/2013      PT End of Session - 12/04/13 1456    Visit Number 5   Number of Visits 24   Date for PT Re-Evaluation 01/12/14   Authorization Type [employer]- visit limit 20 for PT, 20 for OT.   Authorization - Number of Visits 20   PT Start Time 1406   PT Stop Time 1445   PT Time Calculation (min) 39 min   Equipment Utilized During Treatment Gait belt   Activity Tolerance Patient tolerated treatment well   Behavior During Therapy WFL for tasks assessed/performed      Past Medical History  Diagnosis Date  . Neurofibromatosis     tumor down spine and brain  . MRSA (methicillin resistant Staphylococcus aureus) 2005    leg    Past Surgical History  Procedure Laterality Date  . Neck surgery       tumor removal  . Hydrocephalus      [place]unt 1999[place]    There were no vitals taken for this visit.  Visit Diagnosis:  Abnormality of gait  Decreased coordination  Cervicalgia      Subjective Assessment - 12/04/13 1408    Symptoms No new complaints. Still having some increased pain with ADL's, like with vacuming over the weekend. Went out to dinner with spouse last night without issues.    Currently in Pain? Yes   Pain Score 5    Pain Location Neck  between shoulder blades   Pain Orientation Right;Left   Pain Descriptors / Indicators Aching;Sore;Tightness   Pain Type Surgical pain   Pain Onset More than a month ago   Aggravating Factors  increased activity   Pain Relieving Factors rest, medication            [facility] Adult PT Treatment/Exercise - 12/04/13 1421    Transfers   Sit to Stand 5: Supervision;With upper extremity assist;From chair/3-in-1;With armrests;Without upper extremity assist   Sit to Stand Details (indicate cue type and reason) from chair with and without armrests   Stand to Sit 5: Supervision;With upper extremity assist;With  armrests;Without upper extremity assist;To chair/3-in-1   Stand to Sit Details to chairs with and without armrests   Ambulation/Gait   Ambulation/Gait Yes   Ambulation/Gait Assistance 4: Min guard;4: Min assist   Ambulation/Gait Assistance Details cues on posture and sequence with gait   Ambulation Distance (Feet) 250 Feet   Assistive device Straight cane   Gait Pattern Step-through pattern;Decreased stride length;Poor foot clearance - left;Decreased trunk rotation   Gait velocity decreased   High Level Balance   High Level Balance Activities Side stepping;Tandem walking;Marching forwards;Marching backwards  heel/toe walk forward/backward, tandem done forward/backward   High Level Balance Comments 1 UE assist with min guard assist for balance. cues on posture and ex form.                           PT Education - 12/04/13 1456    Education provided Yes   Education Details HEP   Person(s) Educated Patient;Spouse   Methods Explanation;Demonstration;Handout;Verbal cues   Comprehension Verbalized understanding;Returned demonstration          PT Short Term Goals - 12/04/13 1502    PT SHORT TERM GOAL #1   Title be independent with initial HEP for lower extremity strengthening and balance training (12/15/13)   Status On-going  PT SHORT TERM GOAL #2   Title timed up and go score <=23 seconds (12/15/13)   Status On-going   PT SHORT TERM GOAL #3   Title ambulate with a cane with minimal  (12/15/13)   Status On-going   PT SHORT TERM GOAL #4   Title perfome home activities with >25% greater ease (12/15/13)   Status On-going   PT SHORT TERM GOAL #5   Title Reports new surgical /muscle pain (not chronic) increases <2 increments with PT & OT activities in treatment. (Target Date: 12/15/13)   Status On-going          PT Long Term Goals - 12/04/13 1503    PT LONG TERM GOAL #1   Title demonstrate and /or verbalized techniques to redue the risk of re-inury to inclued info on : physical  activity (01/12/14)   Status On-going   PT LONG TERM GOAL #2   Title be independent with advanced HEP for strengthening (01/12/14)   Status On-going   PT LONG TERM GOAL #3   Title timed up and go score <15 seconds (01/12/14)   Status On-going   PT LONG TERM GOAL #4   Title return to work due to increased bilateral lower extremity and increased endurance (01/12/14)   Status On-going   PT LONG TERM GOAL #5   Title perfomr home activities with >60% greater ease  (Target Date: 01/12/14)   Status On-going   PT LONG TERM GOAL #6   Title ability to step onto a curb due to increased bilateral lower extremity strength (Target Date: 01/12/14)   Status On-going   PT LONG TERM GOAL #7   Title Reports new surgical /muscle pain (not chronic) increases <2 increments with work simulated activites / ADLs. (Target Date: 01/12/14)   Status On-going          Plan - 12/04/13 1500    Clinical Impression Statement Issued HEP for balance and ankle strengthening today. Pt progressing toward goals.   Pt will benefit from skilled therapeutic intervention in order to improve on the following deficits Abnormal gait;Decreased coordination;Pain;Difficulty walking;Decreased balance;Decreased strength;Decreased range of motion;Decreased activity tolerance;Decreased endurance   Rehab Potential Good   PT Frequency 3x / week   PT Duration 8 weeks   PT Treatment/Interventions ADLs/Self Care Home Management;Therapeutic activities;Therapeutic exercise;Gait training;Balance training;Stair training;Neuromuscular re-education;Energy conservation;Functional mobility training   PT Next Visit Plan Try gait with bil walking sticks vs cane, continue with balance and strengthening activities.   PT Home Exercise Plan continue with cervical HEP and add balance HEP issued today.   Consulted and Agree with Plan of Care Patient;Family member/caregiver   Family Member Consulted spouse        Problem List Patient Active Problem List    Diagnosis Date Noted  . Preventative health care 06/23/2010  . NEUROFIBROMATOSIS, TYPE 2 04/12/2009  . [facility]         Sallyanne KusterBury, Hamzeh Tall 12/04/2013, 3:05 PM   Sallyanne KusterKathy Dalylah Ramey, PTA, [facility] [address], [address] [place], [place] [address] [phone] 12/04/2013, 3:06 PM

## 2013-12-04 NOTE — Patient Instructions (Signed)
Side-Stepping   At counter: Walk to left side with eyes open. Take even steps, leading with same foot. Make sure each foot lifts off the floor. Repeat in opposite direction. Repeat 3 laps each way. Do __1-2__ sessions per day.   Copyright  VHI. All rights reserved.  Feet Heel-Toe "Tandem"   At counter for support, walk a straight line bringing one foot directly in front of the other forward and then backwards. Repeat for _3 laps each way. Do _1-2__ sessions per day.  Copyright  VHI. All rights reserved.  Walking on Heels   At counter: Walk on heels forward and then backwards. Do _3__ laps each way. Repeat 1-2 times a day.  Copyright  VHI. All rights reserved.  Walking on Toes   At counter: Walk on toes forward and then backward. Do __3__ laps each way. Repeat 1-2 times a day.  Copyright  VHI. All rights reserved.  "I love a Licensed conveyancerarade" Lift   At counter: high knee marches forward and then backwards. Repeat 3 laps each way. 1-2 times a day.l  http://gt2.exer.us/344   Copyright  VHI. All rights reserved.

## 2013-12-05 ENCOUNTER — Ambulatory Visit: Payer: 59 | Admitting: Physical Therapy

## 2013-12-05 ENCOUNTER — Encounter: Payer: Self-pay | Admitting: Physical Therapy

## 2013-12-05 ENCOUNTER — Ambulatory Visit: Payer: 59 | Admitting: Occupational Therapy

## 2013-12-05 DIAGNOSIS — M25511 Pain in right shoulder: Secondary | ICD-10-CM

## 2013-12-05 DIAGNOSIS — Z9889 Other specified postprocedural states: Secondary | ICD-10-CM | POA: Diagnosis not present

## 2013-12-05 DIAGNOSIS — R279 Unspecified lack of coordination: Secondary | ICD-10-CM

## 2013-12-05 DIAGNOSIS — R269 Unspecified abnormalities of gait and mobility: Secondary | ICD-10-CM

## 2013-12-05 DIAGNOSIS — R531 Weakness: Secondary | ICD-10-CM

## 2013-12-05 DIAGNOSIS — R278 Other lack of coordination: Secondary | ICD-10-CM

## 2013-12-05 NOTE — Patient Instructions (Signed)
  Coordination Activities 20 minutes 1-2 times per day with both hand(s).  Rotate ball in fingertips (clockwise and counter-clockwise). Toss ball between hands. Toss ball in air and catch with the same hand. Flip cards 1 at a time as fast as you can. Deal cards with your thumb (Hold deck in hand and push card off top with thumb). Pick up coins, buttons, marbles, dried beans/pasta of different sizes and place in container. Pick up coins and place in container or coin bank. Pick up coins and stack. Pick up coins one at a time until you get 5-10 in your hand, then move coins from palm to fingertips to stack one at a time. Practice writing and/or typing. Screw together nuts and bolts, then unfasten.

## 2013-12-05 NOTE — Therapy (Signed)
Occupational Therapy Treatment  Patient Details  Name: Larry Riley MRN: [id number] Date of Birth: 07-01-1980  Encounter Date: 12/05/2013      OT End of Session - 12/05/13 1520    Visit Number 2   Number of Visits 16   Date for OT Re-Evaluation 01/25/14   OT Start Time 1321   OT Stop Time 1400   OT Time Calculation (min) 39 min   Activity Tolerance Patient tolerated treatment well   Behavior During Therapy [facility] for tasks assessed/performed      Past Medical History  Diagnosis Date  . Neurofibromatosis     tumor down spine and brain  . MRSA (methicillin resistant Staphylococcus aureus) 2005    leg    Past Surgical History  Procedure Laterality Date  . Neck surgery       tumor removal  . Hydrocephalus      [place]unt 1999[place]    There were no vitals taken for this visit.  Visit Diagnosis:  Lack of coordination  Pain in joint, shoulder region, right  Decreased strength      Subjective Assessment - 12/05/13 1325    Currently in Pain? Yes   Pain Score 8    Pain Location Neck   Pain Orientation Right;Left   Pain Descriptors / Indicators Sore   Pain Type Surgical pain   Pain Onset More than a month ago   Pain Frequency Constant   Aggravating Factors  malpositioning in bed   Pain Relieving Factors medication   Multiple Pain Sites No   Pain Score 8   Pain Type Surgical pain   Pain Location Shoulder   Pain Orientation Right   Pain Descriptors / Indicators Aching   Pain Frequency Constant              OT Education - 12/05/13 1520    Education provided Yes   Education Details HEP coordination   Person(s) Educated Spouse   Methods Explanation;Demonstration;Verbal cues   Comprehension Verbalized understanding;Returned demonstration              Plan - 12/05/13 1523    Clinical Impression Statement Hot pack applied to right shoulder/ neck x 15 mins while pt performed fine motor coordination activities with bilateral UE's min v.c. for  performance. Therapist issued HEP. Handwriting activities with built up foam grip, pt demonstrated improved legibility. Pt reports decreased stiffness following application of heat.   Rehab Potential Good   OT Treatment/Interventions Self-care/ADL training;Moist Heat;Neuromuscular education;Therapeutic exercise;DME and/or AE instruction;Functional Mobility Training;Manual Therapy;Therapeutic activities;Patient/family education;Balance training   Plan coordination activities, consider putty   OT Home Exercise Plan coordiantion HEP issued   Consulted and Agree with Plan of Care Patient;Family member/caregiver        Problem List Patient Active Problem List   Diagnosis Date Noted  . Preventative health care 06/23/2010  . NEUROFIBROMATOSIS, TYPE 2 04/12/2009  . [facility]                                             Keene BreathKathryn Rine, OTR/L [phone] Phone: [phone] 3:27 PM 12/05/2013  RINE,KATHRYN 12/05/2013, 3:27 PM

## 2013-12-06 NOTE — Therapy (Signed)
Physical Therapy Treatment  Patient Details  Name: Larry Riley MRN: [id number] Date of Birth: 12-Nov-1980  Encounter Date: 12/05/2013      PT End of Session - 12/05/13 1445    Visit Number 6   Number of Visits 24   Date for PT Re-Evaluation 01/12/14   Authorization Type UHC- visit limit 20 for PT, 20 for OT.   Authorization - Number of Visits 20   PT Start Time 1400   PT Stop Time 1443   PT Time Calculation (min) 43 min   Equipment Utilized During Treatment Gait belt   Activity Tolerance Patient tolerated treatment well   Behavior During Therapy WFL for tasks assessed/performed      Past Medical History  Diagnosis Date  . Neurofibromatosis     tumor down spine and brain  . MRSA (methicillin resistant Staphylococcus aureus) 2005    leg    Past Surgical History  Procedure Laterality Date  . Neck surgery       tumor removal  . Hydrocephalus      [place]    There were no vitals taken for this visit.  Visit Diagnosis:  Abnormality of gait  Decreased coordination  Lack of coordination      Subjective Assessment - 12/05/13 1405    Symptoms Some increase in pain today after sleeping on side last night and stiffness. No chance to do HEP that was issued yesterday, however did walk around [facility] yesterday with RW and spouse.   Currently in Pain? Yes   Pain Score 8    Pain Location Neck  down to between shoulder blades   Pain Orientation Right;Left   Pain Descriptors / Indicators Tightness;Sore;Aching   Pain Type Surgical pain   Pain Onset More than a month ago   Pain Frequency Constant   Aggravating Factors  sleeping on side last night vs on back   Pain Relieving Factors medication            [facility] Adult PT Treatment/Exercise - 12/05/13 1409    Transfers   Sit to Stand 5: Supervision;From bed;From chair/3-in-1;With upper extremity assist   Stand to Sit 5: Supervision;With upper extremity assist;To bed;To chair/3-in-1   Ambulation/Gait   Ambulation/Gait Yes   Ambulation/Gait Assistance 4: Min guard;4: Min assist   Ambulation/Gait Assistance Details cues on posture and sequence/use of walking sticks with gait. cues to decrease stride length and decreased forward placement of sticks with gait for improved posture and balance. Pt balanced with walking sticks, just not smooth/profiecient with sequence and needs cues/supervision for use currently for safety. Pt/spouse okayed for use of sticks in home with supervision only. Both verbalized understanding.                     Ambulation Distance (Feet) 115 Feet  x1, 230 feet x1   Assistive device Other (Comment)  walking sticks   Gait Pattern Step-through pattern;Trunk flexed;Decreased trunk rotation;Wide base of support   Gait velocity decreased   Balance   Balance Assessed Yes   Dynamic Standing Balance   Dynamic Standing - Balance Activities Foam balance beam   Dynamic Standing - Comments with feet across foam beam: sit/stands x10 reps with limited UE assist; standing with feet across beam- alt forward heel taps x10 each leg and alt backwardes toe taps x10 each leg. Min assist for balance with all these activities.               Lumbar Exercises: Quadruped  Straight Leg Raise 10 reps;Limitations  with pball support in quadruped position    Straight Leg Raises Limitations cues/assist to maintain ex form and cues on lift technique of legs. Decreased height of lift for increased comfort to thoracic spine on pball with exercise.                      PT Short Term Goals - 12/05/13 1445    PT SHORT TERM GOAL #1   Title be independent with initial HEP for lower extremity strengthening and balance training (12/15/13)   Status On-going   PT SHORT TERM GOAL #2   Title timed up and go score <=23 seconds (12/15/13)   Status On-going   PT SHORT TERM GOAL #3   Title ambulate with a cane with minimal  (12/15/13)   Status On-going   PT SHORT TERM GOAL #4   Title perfome home activities  with >25% greater ease (12/15/13)   Status On-going   PT SHORT TERM GOAL #5   Title Reports new surgical /muscle pain (not chronic) increases <2 increments with PT & OT activities in treatment. (Target Date: 12/15/13)   Status On-going          PT Long Term Goals - 12/05/13 1445    PT LONG TERM GOAL #1   Title demonstrate and /or verbalized techniques to redue the risk of re-inury to inclued info on : physical activity (01/12/14)   Status On-going   PT LONG TERM GOAL #2   Title be independent with advanced HEP for strengthening (01/12/14)   Status On-going   PT LONG TERM GOAL #3   Title timed up and go score <15 seconds (01/12/14)   Status On-going   PT LONG TERM GOAL #4   Title return to work due to increased bilateral lower extremity and increased endurance (01/12/14)   Status On-going   PT LONG TERM GOAL #5   Title perfomr home activities with >60% greater ease  (Target Date: 01/12/14)   Status On-going   PT LONG TERM GOAL #6   Title ability to step onto a curb due to increased bilateral lower extremity strength (Target Date: 01/12/14)   Status On-going   PT LONG TERM GOAL #7   Title Reports new surgical /muscle pain (not chronic) increases <2 increments with work simulated activites / ADLs. (Target Date: 01/12/14)   Status On-going          Plan - 12/05/13 1445    Clinical Impression Statement Pt making steady progress with mobility and balance. Okay pt to use walking sticks with gait in house with spouse and short distances only at this time.   Pt will benefit from skilled therapeutic intervention in order to improve on the following deficits Abnormal gait;Decreased coordination;Pain;Difficulty walking;Decreased balance;Decreased strength;Decreased range of motion;Decreased activity tolerance;Decreased endurance   Rehab Potential Good   PT Frequency 3x / week   PT Duration 8 weeks   PT Treatment/Interventions ADLs/Self Care Home Management;Therapeutic activities;Therapeutic  exercise;Gait training;Balance training;Stair training;Neuromuscular re-education;Energy conservation;Functional mobility training   PT Next Visit Plan Continue gait with walking sticks vs LRAD; continue with strengthening and balance activities.   Consulted and Agree with Plan of Care Patient;Family member/caregiver   Family Member Consulted spouse        Problem List Patient Active Problem List   Diagnosis Date Noted  . Preventative health care 06/23/2010  . NEUROFIBROMATOSIS, TYPE 2 04/12/2009  . HORNER'S SYNDROME 04/12/2009  Sallyanne KusterBury, Adair Lauderback 12/06/2013, 10:16 AM  Sallyanne KusterKathy Olon Russ, PTA, [facility] [address], [address] [place], [place] [address] [phone] 12/06/2013, 10:16 AM

## 2013-12-11 ENCOUNTER — Encounter: Payer: Self-pay | Admitting: Physical Therapy

## 2013-12-11 ENCOUNTER — Ambulatory Visit: Payer: 59 | Admitting: Physical Therapy

## 2013-12-11 ENCOUNTER — Encounter: Payer: 59 | Admitting: Occupational Therapy

## 2013-12-11 DIAGNOSIS — R269 Unspecified abnormalities of gait and mobility: Secondary | ICD-10-CM

## 2013-12-11 DIAGNOSIS — R279 Unspecified lack of coordination: Secondary | ICD-10-CM

## 2013-12-11 DIAGNOSIS — Z9889 Other specified postprocedural states: Secondary | ICD-10-CM | POA: Diagnosis not present

## 2013-12-11 DIAGNOSIS — R531 Weakness: Secondary | ICD-10-CM

## 2013-12-11 NOTE — Therapy (Signed)
Physical Therapy Treatment  Patient Details  Name: Larry Riley MRN: [id number] Date of Birth: 1980/03/25  Encounter Date: 12/11/2013      PT End of Session - 12/11/13 2144    Visit Number 7   Number of Visits 20   Authorization Type UHC- visit limit 20 for PT, 20 for OT.   Authorization - Number of Visits 20   PT Start Time 1446   PT Stop Time 1533   PT Time Calculation (min) 47 min   Equipment Utilized During Treatment Gait belt   Activity Tolerance Patient tolerated treatment well      Past Medical History  Diagnosis Date  . Neurofibromatosis     tumor down spine and brain  . MRSA (methicillin resistant Staphylococcus aureus) 2005    leg    Past Surgical History  Procedure Laterality Date  . Neck surgery       tumor removal  . Hydrocephalus      [place] 1999[place]    There were no vitals taken for this visit.  Visit Diagnosis:  Decreased strength  Abnormality of gait  Lack of coordination      Subjective Assessment - 12/11/13 2123    Symptoms Pt. reports he had follow upa appt. with surgeon this am - said he was very pleased with progress and no appt. needed again until 6 months   Pertinent History von Reckinhausens disease;    Patient Stated Goals Increase LLE strength and improve gait and balance   Currently in Pain? Yes   Pain Score 5    Pain Location Neck   Pain Orientation Right;Left   Pain Descriptors / Indicators Tightness;Sore   Pain Type Surgical pain   Pain Onset More than a month ago   Pain Frequency Constant   Multiple Pain Sites No            [facility] Adult PT Treatment/Exercise - 12/11/13 1456    Transfers   Sit to Stand 5: Supervision;From bed   Sit to Stand Details (indicate cue type and reason) no UE supprot needed   Ambulation/Gait   Ambulation/Gait Yes   Ambulation/Gait Assistance 4: Min guard   Ambulation/Gait Assistance Details air cast used on LLE due to ankle instability  orthosis improved stabilization and gave pt.  security   Assistive device Straight cane;Other (Comment)  no device used after gait training with cane   Gait Pattern Decreased dorsiflexion - left   Stairs Yes   Stairs Assistance 5: Supervision   Stair Management Technique Two rails;Alternating pattern   Number of Stairs 4   Height of Stairs 6   Exercises   Exercises Ankle;Knee/Hip   Lumbar Exercises: Standing   Heel Raises 10 reps  bil. LE's together   Heel Raises Limitations L plantarflexor weakness   Knee/Hip Exercises: Stretches   Gastroc Stretch 2 reps;30 seconds  3" high block used   Knee/Hip Exercises: Standing   Forward Step Up 10 reps;Left;Hand Hold: 2;Step Height: 6"   Step Down 10 reps;Hand Hold: 2;Step Height: 6"   Knee/Hip Exercises: Supine   Straight Leg Raises 10 reps  2# weight used on LLE   Knee/Hip Exercises: Prone   Hamstring Curl 10 reps  LLE 2# weight   Hip Extension AROM;10 reps  2# weight used on LLE   Ankle Exercises: Seated   Other Seated Ankle Exercises pt. performed left ankle dorsiflexion with yellow theraband x 10 reps each ; left ankle eversion with yellow theraband x 10 reps with min assist  to obtain full ROM   Ankle Exercises: Sidelying   Ankle Eversion AAROM;10 reps   Theraband Level (Ankle Eversion) Level 1 (Yellow)          PT Education - 12/11/13 2142    Education provided Yes   Education Details instructed pt to begin left heel cord stretching at home with 3" high block; also to do plantarflexor strengthening  in standing   Person(s) Educated Patient;Spouse   Methods Explanation;Demonstration   Comprehension Verbalized understanding;Returned demonstration              Plan - 12/11/13 2145    Clinical Impression Statement Pt. improving with gait - ambulated for 1st time in clinic without use of assistive device. Air cast used on LLE significantly improves safety with gait by  preventing inversion, i.e. ankle sprain   Pt will benefit from skilled therapeutic intervention in  order to improve on the following deficits Abnormal gait;Decreased coordination;Pain;Difficulty walking;Decreased balance;Decreased strength;Decreased range of motion;Decreased activity tolerance;Decreased endurance   Rehab Potential Good   PT Frequency 3x / week   PT Duration 8 weeks   PT Treatment/Interventions ADLs/Self Care Home Management;Therapeutic activities;Therapeutic exercise;Gait training;Balance training;Stair training;Neuromuscular re-education;Energy conservation;Functional mobility training   PT Next Visit Plan continue gait; instruct in HEP for strengthening   PT Home Exercise Plan add LLE strengthening   Consulted and Agree with Plan of Care Patient  wife   Family Member Consulted spouse        Problem List Patient Active Problem List   Diagnosis Date Noted  . Preventative health care 06/23/2010  . NEUROFIBROMATOSIS, TYPE 2 04/12/2009  . HORNER'S SYNDROME 04/12/2009                                           Kerry FortSuzanne Mindi Akerson, PT [facility] [address] [place], [place] [address] [phone]    Kary KosDilday, Candia Kingsbury Suzanne 12/11/2013, 9:52 PM

## 2013-12-12 ENCOUNTER — Encounter: Payer: 59 | Admitting: Occupational Therapy

## 2013-12-12 ENCOUNTER — Encounter: Payer: Self-pay | Admitting: Physical Therapy

## 2013-12-12 ENCOUNTER — Ambulatory Visit: Payer: 59 | Attending: Neurosurgery | Admitting: Physical Therapy

## 2013-12-12 ENCOUNTER — Ambulatory Visit: Payer: 59 | Admitting: Occupational Therapy

## 2013-12-12 DIAGNOSIS — R531 Weakness: Secondary | ICD-10-CM

## 2013-12-12 DIAGNOSIS — M542 Cervicalgia: Secondary | ICD-10-CM | POA: Insufficient documentation

## 2013-12-12 DIAGNOSIS — R279 Unspecified lack of coordination: Secondary | ICD-10-CM

## 2013-12-12 DIAGNOSIS — M25511 Pain in right shoulder: Secondary | ICD-10-CM

## 2013-12-12 DIAGNOSIS — Z9889 Other specified postprocedural states: Secondary | ICD-10-CM | POA: Diagnosis present

## 2013-12-12 DIAGNOSIS — R269 Unspecified abnormalities of gait and mobility: Secondary | ICD-10-CM

## 2013-12-12 NOTE — Patient Instructions (Signed)
1. Grip Strengthening (Resistive Putty) Perform for both hands   Squeeze putty using thumb and all fingers. Repeat _20___ times. Do __2__ sessions per day.   2. Roll putty into tube on table and pinch between each finger and thumb x 10 reps each. (can do ring and small finger together) 3. Pinch with the side of your hand, and thumb 20 reps, each hand 1x day 4. Pinch with first 2 fingers and your thumb for each hand 20 reps 1x day  Typing program is: typing masterpro, you can try free trial version.     Copyright  VHI. All rights reserved.

## 2013-12-12 NOTE — Therapy (Signed)
[facility] [address] [place], [place], [address] Phone: [phone]   Fax:  [phone]  Occupational Therapy Treatment  Patient Details  Name: Larry Riley MRN: [id number] Date of Birth: 05/19/1980  Encounter Date: 12/12/2013      OT End of Session - 12/12/13 1704    Visit Number 2   Number of Visits 16   Date for OT Re-Evaluation 01/25/14   OT Start Time 1545   OT Stop Time 1630   OT Time Calculation (min) 45 min   Equipment Utilized During Treatment red putty   Activity Tolerance Patient tolerated treatment well   Behavior During Therapy [facility] for tasks assessed/performed      Past Medical History  Diagnosis Date  . Neurofibromatosis     tumor down spine and brain  . MRSA (methicillin resistant Staphylococcus aureus) 2005    leg    Past Surgical History  Procedure Laterality Date  . Neck surgery       tumor removal  . Hydrocephalus      [place] 1999[place]    There were no vitals taken for this visit.  Visit Diagnosis:  Lack of coordination  Decreased strength  Pain in joint, shoulder region, right      Subjective Assessment - 12/12/13 1702    Currently in Pain? Yes   Pain Score 6    Pain Location Neck   Pain Orientation Right   Pain Descriptors / Indicators Sore;Tightness   Pain Type Surgical pain   Pain Onset More than a month ago   Pain Frequency Constant   Aggravating Factors  malposistioning, work   Pain Relieving Factors heat   Multiple Pain Sites No              OT Education - 12/12/13 1704    Education provided Yes   Education Details red theraputty HEP   Person(s) Educated Patient;Spouse   Methods Explanation;Demonstration   Comprehension Verbalized understanding;Returned demonstration              Plan - 12/12/13 1705    Clinical Impression Statement Hotpack applied to neck/ right shoulder, while pt was instructed in theraputty HEP for bilateral UE's , min v.c.  Typing test: 22 WPM, 77% accuracy, followed by typing activities for incr. speed and accuracy, min v.c. for posture/ positioning. UBE x 6 mins level 1 for conditioning/ reciprocal movement. Pt reported pain decr. to 3/10 at end of session.   Rehab Potential Good   Clinical Impairments Affecting Rehab Potential Pt will beneft form skilled OT to address the following:  decreased bilateral coordination, decreased funcitonal use of BUE's, decreased strength BUE's   OT Frequency 2x / week   OT Duration 8 weeks   OT Treatment/Interventions Self-care/ADL training;Moist Heat;Neuromuscular education;Therapeutic exercise;DME and/or AE instruction;Functional Mobility Training;Manual Therapy;Therapeutic activities;Patient/family education;Balance training;Therapeutic exercises;Parrafin;Cryotherapy;Ultrasound;Fluidtherapy   Plan coordination, UE functional use   OT Home Exercise Plan putty HEP issued   Consulted and Agree with Plan of Care Patient;Family member/caregiver                               Problem List Patient Active Problem List   Diagnosis Date Noted  . Preventative health care 06/23/2010  . NEUROFIBROMATOSIS, TYPE 2 04/12/2009  . HORNER'S SYNDROME 04/12/2009    RINE,KATHRYN 12/12/2013, 5:12 PM  Keene BreathKathryn Rine, OTR/L [phone] Phone: [phone] 5:13 PM 12/12/2013

## 2013-12-12 NOTE — Therapy (Signed)
Physical Therapy Treatment  Patient Details  Name: Larry Riley MRN: [id number] Date of Birth: 03-21-1980  Encounter Date: 12/12/2013      PT End of Session - 12/12/13 1604    Visit Number 8   Number of Visits 20   Date for PT Re-Evaluation 12/15/13   Authorization Type UHC- visit limit 20 for PT, 20 for OT.   Authorization - Number of Visits 20   PT Start Time 1446   PT Stop Time 1540   PT Time Calculation (min) 54 min   Equipment Utilized During Treatment Gait belt   Activity Tolerance Patient tolerated treatment well      Past Medical History  Diagnosis Date  . Neurofibromatosis     tumor down spine and brain  . MRSA (methicillin resistant Staphylococcus aureus) 2005    leg    Past Surgical History  Procedure Laterality Date  . Neck surgery       tumor removal  . Hydrocephalus      [place] 1999[place]    There were no vitals taken for this visit.  Visit Diagnosis:  Decreased strength  Lack of coordination  Abnormality of gait      Subjective Assessment - 12/12/13 1553    Symptoms Pt. states he worked 4 hours from home this morning - able to return to doing some work projects for 1st time   Pertinent History von Reckinhausens disease;    Patient Stated Goals Increase LLE strength and improve gait and balance   Currently in Pain? Yes   Pain Score 5    Pain Location Neck   Pain Orientation Right;Left   Pain Descriptors / Indicators Sore;Tightness   Pain Type Surgical pain   Pain Onset More than a month ago   Pain Frequency Constant   Multiple Pain Sites No            [facility] Adult PT Treatment/Exercise - 12/12/13 1556    Ambulation/Gait   Ambulation/Gait Yes   Ambulation/Gait Assistance 4: Min guard  CGA    Ambulation/Gait Assistance Details toe off AFO used for LLE foot clearance; compared with [place] AFO on LLE; 250' with toe off AFO; only 25' with [place] due to discomfort on left ankle   Ambulation Distance (Feet) 250 Feet   Assistive  device None   Gait Pattern --  decreased push off LLE   Lumbar Exercises: Machines for Strengthening   Leg Press ; 10 reps bil. LE's 55#   Knee/Hip Exercises: Aerobic   Elliptical 1 1/2 mins forward; 1" backward level 1.2   Ankle Exercises: Stretches   Plantar Fascia Stretch --  Prostretch used for LLE heel cord stretching   Gastroc Stretch 1 rep;20 seconds  prostretch used for L heel cord stretching   Ankle Exercises: Standing   Toe Raise 10 reps;3 seconds  LLE only; 10 reps bil. LE together    There Act.; pt. Performed trunk stabilization exercise in tall kneeling on floor, progressed to L 1/2 kneeling with minimal UE support with CGA: added head turns side to side for perturbation x 10 reps. Pt. Moved laterally 15' on mat in tall kneeling x 1 rep followed by amb. On knees in tall kneeling forward and backward 15' x 1 rep each with CGA for trunk/core stabilization.      PT Education - 12/11/13 2142    Education provided Yes   Education Details instructed pt to begin left heel cord stretching at home with 3" high block; also to do plantarflexor  strengthening  in standing   Person(s) Educated Patient;Spouse   Methods Explanation;Demonstration   Comprehension Verbalized understanding;Returned demonstration              Plan - 12/12/13 1606    Clinical Impression Statement gait significantly improved with use of toe off AFO which provided foot clearance and ankle stability to prevent left ankle supination   Pt will benefit from skilled therapeutic intervention in order to improve on the following deficits Abnormal gait;Decreased coordination;Pain;Difficulty walking;Decreased balance;Decreased strength;Decreased range of motion;Decreased activity tolerance;Decreased endurance   Rehab Potential Good   PT Frequency 3x / week   PT Duration 8 weeks   PT Treatment/Interventions ADLs/Self Care Home Management;Therapeutic activities;Therapeutic exercise;Gait training;Balance  training;Stair training;Neuromuscular re-education;Energy conservation;Functional mobility training   PT Next Visit Plan continue gait; instruct in HEP for strengthening; will request script from MD for [place] AFO with T strap   PT Home Exercise Plan continue with current HEP   Consulted and Agree with Plan of Care Patient   Family Member Consulted spouse        Problem List Patient Active Problem List   Diagnosis Date Noted  . Preventative health care 06/23/2010  . NEUROFIBROMATOSIS, TYPE 2 04/12/2009  . HORNER'S SYNDROME 04/12/2009                                         Kerry FortSuzanne Pharoah Goggins, PT [facility] [address] [place], [place] [address] [phone]      Kary KosDilday, Ruth Kovich Suzanne 12/12/2013, 4:14 PM

## 2013-12-13 ENCOUNTER — Ambulatory Visit: Payer: 59 | Admitting: Occupational Therapy

## 2013-12-14 ENCOUNTER — Ambulatory Visit (INDEPENDENT_AMBULATORY_CARE_PROVIDER_SITE_OTHER): Payer: 59 | Admitting: Family Medicine

## 2013-12-14 ENCOUNTER — Encounter: Payer: Self-pay | Admitting: Family Medicine

## 2013-12-14 ENCOUNTER — Ambulatory Visit: Payer: 59 | Admitting: Physical Therapy

## 2013-12-14 ENCOUNTER — Encounter: Payer: Self-pay | Admitting: Physical Therapy

## 2013-12-14 ENCOUNTER — Encounter: Payer: Self-pay | Admitting: Occupational Therapy

## 2013-12-14 ENCOUNTER — Ambulatory Visit: Payer: 59 | Admitting: Occupational Therapy

## 2013-12-14 ENCOUNTER — Other Ambulatory Visit: Payer: Self-pay | Admitting: Family Medicine

## 2013-12-14 VITALS — BP 132/86 | HR 67 | Temp 98.4°F | Ht 68.0 in | Wt 168.0 lb

## 2013-12-14 DIAGNOSIS — R279 Unspecified lack of coordination: Secondary | ICD-10-CM

## 2013-12-14 DIAGNOSIS — M25511 Pain in right shoulder: Secondary | ICD-10-CM

## 2013-12-14 DIAGNOSIS — R8290 Unspecified abnormal findings in urine: Secondary | ICD-10-CM

## 2013-12-14 DIAGNOSIS — Z9889 Other specified postprocedural states: Secondary | ICD-10-CM | POA: Diagnosis not present

## 2013-12-14 DIAGNOSIS — Q8501 Neurofibromatosis, type 1: Secondary | ICD-10-CM

## 2013-12-14 DIAGNOSIS — K219 Gastro-esophageal reflux disease without esophagitis: Secondary | ICD-10-CM

## 2013-12-14 DIAGNOSIS — R269 Unspecified abnormalities of gait and mobility: Secondary | ICD-10-CM

## 2013-12-14 DIAGNOSIS — R531 Weakness: Secondary | ICD-10-CM

## 2013-12-14 DIAGNOSIS — R319 Hematuria, unspecified: Secondary | ICD-10-CM

## 2013-12-14 DIAGNOSIS — Z0000 Encounter for general adult medical examination without abnormal findings: Secondary | ICD-10-CM

## 2013-12-14 LAB — LIPID PANEL
CHOL/HDL RATIO: 4
Cholesterol: 174 mg/dL (ref 0–200)
HDL: 43.8 mg/dL (ref >39.00)
LDL Cholesterol: 112 mg/dL — ABNORMAL HIGH (ref 0–99)
NonHDL: 130.2
Triglycerides: 92 mg/dL (ref 0.0–149.0)
VLDL: 18.4 mg/dL (ref 0.0–40.0)

## 2013-12-14 LAB — HEPATIC FUNCTION PANEL
ALT: 29 U/L (ref 0–53)
AST: 19 U/L (ref 0–37)
Albumin: 4.4 g/dL (ref 3.5–5.2)
Alkaline Phosphatase: 102 U/L (ref 39–117)
BILIRUBIN DIRECT: 0 mg/dL (ref 0.0–0.3)
Total Bilirubin: 0.7 mg/dL (ref 0.2–1.2)
Total Protein: 7.2 g/dL (ref 6.0–8.3)

## 2013-12-14 LAB — POCT URINALYSIS DIPSTICK
BILIRUBIN UA: NEGATIVE
Glucose, UA: NEGATIVE
Leukocytes, UA: NEGATIVE
Nitrite, UA: NEGATIVE
UROBILINOGEN UA: 0.2
pH, UA: 5.5

## 2013-12-14 LAB — CBC WITH DIFFERENTIAL/PLATELET
BASOS ABS: 0.1 10*3/uL (ref 0.0–0.1)
Basophils Relative: 1 % (ref 0.0–3.0)
EOS ABS: 0.6 10*3/uL (ref 0.0–0.7)
Eosinophils Relative: 9.4 % — ABNORMAL HIGH (ref 0.0–5.0)
HCT: 39.3 % (ref 39.0–52.0)
Hemoglobin: 13.1 g/dL (ref 13.0–17.0)
LYMPHS PCT: 34.9 % (ref 12.0–46.0)
Lymphs Abs: 2.1 10*3/uL (ref 0.7–4.0)
MCHC: 33.2 g/dL (ref 30.0–36.0)
MCV: 89.3 fl (ref 78.0–100.0)
Monocytes Absolute: 0.6 10*3/uL (ref 0.1–1.0)
Monocytes Relative: 10.5 % (ref 3.0–12.0)
NEUTROS PCT: 44.2 % (ref 43.0–77.0)
Neutro Abs: 2.7 10*3/uL (ref 1.4–7.7)
PLATELETS: 572 10*3/uL — AB (ref 150.0–400.0)
RBC: 4.41 Mil/uL (ref 4.22–5.81)
RDW: 14.1 % (ref 11.5–15.5)
WBC: 6 10*3/uL (ref 4.0–10.5)

## 2013-12-14 LAB — BASIC METABOLIC PANEL
BUN: 15 mg/dL (ref 6–23)
CALCIUM: 9.4 mg/dL (ref 8.4–10.5)
CHLORIDE: 106 meq/L (ref 96–112)
CO2: 25 meq/L (ref 19–32)
CREATININE: 0.9 mg/dL (ref 0.4–1.5)
GFR: 104.36 mL/min (ref >60.00)
GLUCOSE: 90 mg/dL (ref 70–99)
Potassium: 3.8 mEq/L (ref 3.5–5.1)
Sodium: 139 mEq/L (ref 135–145)

## 2013-12-14 LAB — TSH: TSH: 1.8 u[IU]/mL (ref 0.35–4.50)

## 2013-12-14 MED ORDER — PANTOPRAZOLE SODIUM 40 MG PO TBEC: 40.0000 mg | DELAYED_RELEASE_TABLET | Freq: Every day | ORAL | Status: DC

## 2013-12-14 NOTE — Patient Instructions (Signed)
Strengthening: Straight Leg Raise (Phase 3)   Resting on hands, tighten muscles on front of left thigh, then lift leg ____ inches from surface, keeping knee locked. Repeat ____ times per set. Do ____ sets per session. Do ____ sessions per day.  http://orth.exer.us/618   Copyright  VHI. All rights reserved.  Abduction   Slide one leg out to side. Keep kneecap pointing up. Gently bring leg back to pillow. Repeat with other leg. Repeat ____ times. Do ____ sessions per day.  http://gt2.exer.us/373   Copyright  VHI. All rights reserved.  Strengthening: Hip Abduction (Side-Lying)   Tighten muscles on front of left thigh, then lift leg ____ inches from surface, keeping knee locked.  Repeat ____ times per set. Do ____ sets per session. Do ____ sessions per day.  http://orth.exer.us/622   Copyright  VHI. All rights reserved.  Hamstring Curl: Resisted (Prone)   Anchor behind, with tubing on left ankle, leg straight, bend knee. Repeat ____ times per set. Do ____ sets per session. Do ____ sessions per day.  http://orth.exer.us/670   Copyright  VHI. All rights reserved.  Strengthening: Hip Extension (Prone)   Tighten muscles on front of left thigh, then lift leg ____ inches from surface, keeping knee locked. Repeat ____ times per set. Do ____ sets per session. Do ____ sessions per day.  http://orth.exer.us/620   Copyright  VHI. All rights reserved.

## 2013-12-14 NOTE — Progress Notes (Signed)
Subjective:    Patient ID: Larry Riley, male    DOB: 1980/07/23, 33 y.o.   MRN: [id number]  HPI Pt is here for cpe and labs.  Pt just [facility].  He is in PT/OT and is doing well.    Review of Systems Review of Systems  Constitutional: Negative for activity change, appetite change and fatigue.  HENT: Negative for hearing loss, congestion, tinnitus and ear discharge.  dentist [id number] Eyes: Negative for visual disturbance (see optho q1y -- vision corrected to 20/20 with glasses).  Respiratory: Negative for cough, chest tightness and shortness of breath.   Cardiovascular: Negative for chest pain, palpitations and leg swelling.  Gastrointestinal: Negative for abdominal pain, diarrhea, constipation and abdominal distention.  Genitourinary: Negative for urgency, frequency, decreased urine volume and difficulty urinating.  Musculoskeletal: Negative for back pain, arthralgias --- walking with walker since surgery. Skin: Negative for color change, pallor and rash.  Neurological: Negative for dizziness, light-headedness, numbness and headaches.  Hematological: Negative for adenopathy. Does not bruise/bleed easily.  Psychiatric/Behavioral: Negative for suicidal ideas, confusion, sleep disturbance, self-injury, dysphoric mood, decreased concentration and agitation.   Past Medical History  Diagnosis Date  . Neurofibromatosis     tumor down spine and brain  . MRSA (methicillin resistant Staphylococcus aureus) 2005    leg   History   Social History  . Marital Status: Married    Spouse Name: N/A    Number of Children: N/A  . Years of Education: N/A   Occupational History  . [employer]    Social History Main Topics  . Smoking status: Never Smoker   . Smokeless tobacco: Never Used  . Alcohol Use: Yes     Comment: very rare  . Drug Use: No  . Sexual Activity: No   Other Topics Concern  . Not on file   Social History Narrative   Exercise-- no   Past Surgical History  Procedure Laterality Date  . Neck surgery       tumor removal  . Hydrocephalus      shunt 1999[place]  . Spine surgery  10/2013    c2-t2 fusion  , laminectomy c1-6-- [facility]  . Vasectomy  02/2013   Family History  Problem Relation Age of Onset  . Prostate cancer    . Diabetes Mother   . Arthritis Father   . Hypertension Father   . Arthritis Paternal Grandmother   . Arthritis Paternal Grandfather   . Diabetes Paternal Grandfather   . Hyperlipidemia Paternal Grandfather   . Hypertension Paternal Grandfather    No Known Allergies Current Outpatient Prescriptions  Medication Sig Dispense Refill  . acetaminophen (TYLENOL) 325 MG tablet Take 650 mg by mouth every 6 (six) hours as needed for moderate pain.    . diazepam (VALIUM) 5 MG tablet Take 5 mg by mouth every 6 (six) hours as needed for muscle spasms.    Marland Kitchen. omeprazole (PRILOSEC) 10 MG capsule Take 10 mg by mouth 2 (two) times daily.    Marland Kitchen. oxyCODONE-acetaminophen (PERCOCET) 10-325 MG per tablet Take 1 tablet by mouth every 4 (four) hours as needed for pain.    . pantoprazole (PROTONIX) 40 MG tablet Take 1 tablet (40 mg total) by mouth daily. 90 tablet 3  . metaxalone (SKELAXIN) 800 MG tablet Take 1 tablet (800 mg total) by mouth 4 (four) times daily. (Patient not taking: Reported on 12/14/2013) 60 tablet 1   No current facility-administered medications for this  visit.         Objective:   Physical Exam  BP 132/86 mmHg  Pulse 67  Temp(Src) 98.4 F (36.9 C) (Oral)  Ht 5\' 8"  (1.727 m)  Wt 168 lb (76.204 kg)  BMI 25.55 kg/m2  SpO2 99% General appearance: alert, cooperative, appears stated age and no distress Head: Normocephalic, without obvious abnormality, atraumatic Eyes: conjunctivae/corneas clear. PERRL, EOM's intact. Fundi benign. Ears: normal TM's and external ear canals both ears Nose: Nares normal. Septum midline. Mucosa normal. No drainage or sinus  tenderness. Throat: lips, mucosa, and tongue normal; teeth and gums normal Neck: no adenopathy, no carotid bruit, no JVD, supple, symmetrical, trachea midline and thyroid not enlarged, symmetric, no tenderness/mass/nodules--- scar on back of neck healing well Back: symmetric, no curvature. ROM normal. No CVA tenderness. Lungs: clear to auscultation bilaterally Chest wall: no tenderness Heart: regular rate and rhythm, S1, S2 normal, no murmur, click, rub or gallop Abdomen: soft, non-tender; bowel sounds normal; no masses,  no organomegaly Male genitalia: normal, penis: no lesions or discharge. testes: no masses or tenderness. no hernias Extremities: extremities normal, atraumatic, no cyanosis or edema Pulses: 2+ and symmetric Skin: Skin color, texture, turgor normal. No rashes or lesions Lymph nodes: Cervical, supraclavicular, and axillary nodes normal. Neurologic: Gait: Drop-foot--improving since surgery-- walking with walker now Psych- no depression, no anxiety-- pt did ask for a Librarian, academicChristian counselor--- [facility] recommended .     Assessment & Plan:  1. Gastroesophageal reflux disease, esophagitis presence not specified Refill meds - pantoprazole (PROTONIX) 40 MG tablet; Take 1 tablet (40 mg total) by mouth daily.  Dispense: 90 tablet; Refill: 3  2. Neurofibromatosis, type 1 Per neurosurgery  3. Preventative health care ghm utd Check lab - Basic metabolic panel - CBC with Differential - Hepatic function panel - Lipid panel - POCT urinalysis dipstick - TSH Flu shot given  4. Hematuria  - Urine Culture  5. Urine abnormality  - Urine Culture

## 2013-12-14 NOTE — Patient Instructions (Signed)
Preventive Care for Adults A healthy lifestyle and preventive care can promote health and wellness. Preventive health guidelines for men include the following key practices:  A routine yearly physical is a good way to check with your health care provider about your health and preventative screening. It is a chance to share any concerns and updates on your health and to receive a thorough exam.  Visit your dentist for a routine exam and preventative care every 6 months. Brush your teeth twice a day and floss once a day. Good oral hygiene prevents tooth decay and gum disease.  The frequency of eye exams is based on your age, health, family medical history, use of contact lenses, and other factors. Follow your health care provider's recommendations for frequency of eye exams.  Eat a healthy diet. Foods such as vegetables, fruits, whole grains, low-fat dairy products, and lean protein foods contain the nutrients you need without too many calories. Decrease your intake of foods high in solid fats, added sugars, and salt. Eat the right amount of calories for you.Get information about a proper diet from your health care provider, if necessary.  Regular physical exercise is one of the most important things you can do for your health. Most adults should get at least 150 minutes of moderate-intensity exercise (any activity that increases your heart rate and causes you to sweat) each week. In addition, most adults need muscle-strengthening exercises on 2 or more days a week.  Maintain a healthy weight. The body mass index (BMI) is a screening tool to identify possible weight problems. It provides an estimate of body fat based on height and weight. Your health care provider can find your BMI and can help you achieve or maintain a healthy weight.For adults 20 years and older:  A BMI below 18.5 is considered underweight.  A BMI of 18.5 to 24.9 is normal.  A BMI of 25 to 29.9 is considered overweight.  A BMI  of 30 and above is considered obese.  Maintain normal blood lipids and cholesterol levels by exercising and minimizing your intake of saturated fat. Eat a balanced diet with plenty of fruit and vegetables. Blood tests for lipids and cholesterol should begin at age 50 and be repeated every 5 years. If your lipid or cholesterol levels are high, you are over 50, or you are at high risk for heart disease, you may need your cholesterol levels checked more frequently.Ongoing high lipid and cholesterol levels should be treated with medicines if diet and exercise are not working.  If you smoke, find out from your health care provider how to quit. If you do not use tobacco, do not start.  Lung cancer screening is recommended for adults aged 73-80 years who are at high risk for developing lung cancer because of a history of smoking. A yearly low-dose CT scan of the lungs is recommended for people who have at least a 30-pack-year history of smoking and are a current smoker or have quit within the past 15 years. A pack year of smoking is smoking an average of 1 pack of cigarettes a day for 1 year (for example: 1 pack a day for 30 years or 2 packs a day for 15 years). Yearly screening should continue until the smoker has stopped smoking for at least 15 years. Yearly screening should be stopped for people who develop a health problem that would prevent them from having lung cancer treatment.  If you choose to drink alcohol, do not have more than  2 drinks per day. One drink is considered to be 12 ounces (355 mL) of beer, 5 ounces (148 mL) of wine, or 1.5 ounces (44 mL) of liquor.  Avoid use of street drugs. Do not share needles with anyone. Ask for help if you need support or instructions about stopping the use of drugs.  High blood pressure causes heart disease and increases the risk of stroke. Your blood pressure should be checked at least every 1-2 years. Ongoing high blood pressure should be treated with  medicines, if weight loss and exercise are not effective.  If you are 45-79 years old, ask your health care provider if you should take aspirin to prevent heart disease.  Diabetes screening involves taking a blood sample to check your fasting blood sugar level. This should be done once every 3 years, after age 45, if you are within normal weight and without risk factors for diabetes. Testing should be considered at a younger age or be carried out more frequently if you are overweight and have at least 1 risk factor for diabetes.  Colorectal cancer can be detected and often prevented. Most routine colorectal cancer screening begins at the age of 50 and continues through age 75. However, your health care provider may recommend screening at an earlier age if you have risk factors for colon cancer. On a yearly basis, your health care provider may provide home test kits to check for hidden blood in the stool. Use of a small camera at the end of a tube to directly examine the colon (sigmoidoscopy or colonoscopy) can detect the earliest forms of colorectal cancer. Talk to your health care provider about this at age 50, when routine screening begins. Direct exam of the colon should be repeated every 5-10 years through age 75, unless early forms of precancerous polyps or small growths are found.  People who are at an increased risk for hepatitis B should be screened for this virus. You are considered at high risk for hepatitis B if:  You were born in a country where hepatitis B occurs often. Talk with your health care provider about which countries are considered high risk.  Your parents were born in a high-risk country and you have not received a shot to protect against hepatitis B (hepatitis B vaccine).  You have HIV or AIDS.  You use needles to inject street drugs.  You live with, or have sex with, someone who has hepatitis B.  You are a man who has sex with other men (MSM).  You get hemodialysis  treatment.  You take certain medicines for conditions such as cancer, organ transplantation, and autoimmune conditions.  Hepatitis C blood testing is recommended for all people born from 1945 through 1965 and any individual with known risks for hepatitis C.  Practice safe sex. Use condoms and avoid high-risk sexual practices to reduce the spread of sexually transmitted infections (STIs). STIs include gonorrhea, chlamydia, syphilis, trichomonas, herpes, HPV, and human immunodeficiency virus (HIV). Herpes, HIV, and HPV are viral illnesses that have no cure. They can result in disability, cancer, and death.  If you are at risk of being infected with HIV, it is recommended that you take a prescription medicine daily to prevent HIV infection. This is called preexposure prophylaxis (PrEP). You are considered at risk if:  You are a man who has sex with other men (MSM) and have other risk factors.  You are a heterosexual man, are sexually active, and are at increased risk for HIV infection.    You take drugs by injection.  You are sexually active with a partner who has HIV.  Talk with your health care provider about whether you are at high risk of being infected with HIV. If you choose to begin PrEP, you should first be tested for HIV. You should then be tested every 3 months for as long as you are taking PrEP.  A one-time screening for abdominal aortic aneurysm (AAA) and surgical repair of large AAAs by ultrasound are recommended for men ages 32 to 67 years who are current or former smokers.  Healthy men should no longer receive prostate-specific antigen (PSA) blood tests as part of routine cancer screening. Talk with your health care provider about prostate cancer screening.  Testicular cancer screening is not recommended for adult males who have no symptoms. Screening includes self-exam, a health care provider exam, and other screening tests. Consult with your health care provider about any symptoms  you have or any concerns you have about testicular cancer.  Use sunscreen. Apply sunscreen liberally and repeatedly throughout the day. You should seek shade when your shadow is shorter than you. Protect yourself by wearing long sleeves, pants, a wide-brimmed hat, and sunglasses year round, whenever you are outdoors.  Once a month, do a whole-body skin exam, using a mirror to look at the skin on your back. Tell your health care provider about new moles, moles that have irregular borders, moles that are larger than a pencil eraser, or moles that have changed in shape or color.  Stay current with required vaccines (immunizations).  Influenza vaccine. All adults should be immunized every year.  Tetanus, diphtheria, and acellular pertussis (Td, Tdap) vaccine. An adult who has not previously received Tdap or who does not know his vaccine status should receive 1 dose of Tdap. This initial dose should be followed by tetanus and diphtheria toxoids (Td) booster doses every 10 years. Adults with an unknown or incomplete history of completing a 3-dose immunization series with Td-containing vaccines should begin or complete a primary immunization series including a Tdap dose. Adults should receive a Td booster every 10 years.  Varicella vaccine. An adult without evidence of immunity to varicella should receive 2 doses or a second dose if he has previously received 1 dose.  Human papillomavirus (HPV) vaccine. Males aged 68-21 years who have not received the vaccine previously should receive the 3-dose series. Males aged 22-26 years may be immunized. Immunization is recommended through the age of 6 years for any male who has sex with males and did not get any or all doses earlier. Immunization is recommended for any person with an immunocompromised condition through the age of 49 years if he did not get any or all doses earlier. During the 3-dose series, the second dose should be obtained 4-8 weeks after the first  dose. The third dose should be obtained 24 weeks after the first dose and 16 weeks after the second dose.  Zoster vaccine. One dose is recommended for adults aged 50 years or older unless certain conditions are present.  Measles, mumps, and rubella (MMR) vaccine. Adults born before [birth year] generally are considered immune to measles and mumps. Adults born in [birth year] or later should have 1 or more doses of MMR vaccine unless there is a contraindication to the vaccine or there is laboratory evidence of immunity to each of the three diseases. A routine second dose of MMR vaccine should be obtained at least 28 days after the first dose for students attending postsecondary  schools, health care workers, or international travelers. People who received inactivated measles vaccine or an unknown type of measles vaccine during 1963-1967 should receive 2 doses of MMR vaccine. People who received inactivated mumps vaccine or an unknown type of mumps vaccine before 1979 and are at high risk for mumps infection should consider immunization with 2 doses of MMR vaccine. Unvaccinated health care workers born before 1957 who lack laboratory evidence of measles, mumps, or rubella immunity or laboratory confirmation of disease should consider measles and mumps immunization with 2 doses of MMR vaccine or rubella immunization with 1 dose of MMR vaccine.  Pneumococcal 13-valent conjugate (PCV13) vaccine. When indicated, a person who is uncertain of his immunization history and has no record of immunization should receive the PCV13 vaccine. An adult aged 19 years or older who has certain medical conditions and has not been previously immunized should receive 1 dose of PCV13 vaccine. This PCV13 should be followed with a dose of pneumococcal polysaccharide (PPSV23) vaccine. The PPSV23 vaccine dose should be obtained at least 8 weeks after the dose of PCV13 vaccine. An adult aged 19 years or older who has certain medical conditions and  previously received 1 or more doses of PPSV23 vaccine should receive 1 dose of PCV13. The PCV13 vaccine dose should be obtained 1 or more years after the last PPSV23 vaccine dose.  Pneumococcal polysaccharide (PPSV23) vaccine. When PCV13 is also indicated, PCV13 should be obtained first. All adults aged 65 years and older should be immunized. An adult younger than age 65 years who has certain medical conditions should be immunized. Any person who resides in a nursing home or long-term care facility should be immunized. An adult smoker should be immunized. People with an immunocompromised condition and certain other conditions should receive both PCV13 and PPSV23 vaccines. People with human immunodeficiency virus (HIV) infection should be immunized as soon as possible after diagnosis. Immunization during chemotherapy or radiation therapy should be avoided. Routine use of PPSV23 vaccine is not recommended for American Indians, [place] Natives, or people younger than 65 years unless there are medical conditions that require PPSV23 vaccine. When indicated, people who have unknown immunization and have no record of immunization should receive PPSV23 vaccine. One-time revaccination 5 years after the first dose of PPSV23 is recommended for people aged 19-64 years who have chronic kidney failure, nephrotic syndrome, asplenia, or immunocompromised conditions. People who received 1-2 doses of PPSV23 before age 65 years should receive another dose of PPSV23 vaccine at age 65 years or later if at least 5 years have passed since the previous dose. Doses of PPSV23 are not needed for people immunized with PPSV23 at or after age 65 years.  Meningococcal vaccine. Adults with asplenia or persistent complement component deficiencies should receive 2 doses of quadrivalent meningococcal conjugate (MenACWY-D) vaccine. The doses should be obtained at least 2 months apart. Microbiologists working with certain meningococcal bacteria,  military recruits, people at risk during an outbreak, and people who travel to or live in countries with a high rate of meningitis should be immunized. A first-year college student up through age 21 years who is living in a residence hall should receive a dose if he did not receive a dose on or after his 16th birthday. Adults who have certain high-risk conditions should receive one or more doses of vaccine.  Hepatitis A vaccine. Adults who wish to be protected from this disease, have certain high-risk conditions, work with hepatitis A-infected animals, work in hepatitis A research labs, or   travel to or work in countries with a high rate of hepatitis A should be immunized. Adults who were previously unvaccinated and who anticipate close contact with an international adoptee during the first 60 days after arrival in the [place] from a country with a high rate of hepatitis A should be immunized.  Hepatitis B vaccine. Adults should be immunized if they wish to be protected from this disease, have certain high-risk conditions, may be exposed to blood or other infectious body fluids, are household contacts or sex partners of hepatitis B positive people, are clients or workers in certain care facilities, or travel to or work in countries with a high rate of hepatitis B.  Haemophilus influenzae type b (Hib) vaccine. A previously unvaccinated person with asplenia or sickle cell disease or having a scheduled splenectomy should receive 1 dose of Hib vaccine. Regardless of previous immunization, a recipient of a hematopoietic stem cell transplant should receive a 3-dose series 6-12 months after his successful transplant. Hib vaccine is not recommended for adults with HIV infection. Preventive Service / Frequency Ages 52 to 17  Blood pressure check.** / Every 1 to 2 years.  Lipid and cholesterol check.** / Every 5 years beginning at age 69.  Hepatitis C blood test.** / For any individual with known risks for  hepatitis C.  Skin self-exam. / Monthly.  Influenza vaccine. / Every year.  Tetanus, diphtheria, and acellular pertussis (Tdap, Td) vaccine.** / Consult your health care provider. 1 dose of Td every 10 years.  Varicella vaccine.** / Consult your health care provider.  HPV vaccine. / 3 doses over 6 months, if 72 or younger.  Measles, mumps, rubella (MMR) vaccine.** / You need at least 1 dose of MMR if you were born in 1957 or later. You may also need a second dose.  Pneumococcal 13-valent conjugate (PCV13) vaccine.** / Consult your health care provider.  Pneumococcal polysaccharide (PPSV23) vaccine.** / 1 to 2 doses if you smoke cigarettes or if you have certain conditions.  Meningococcal vaccine.** / 1 dose if you are age 35 to 60 years and a Market researcher living in a residence hall, or have one of several medical conditions. You may also need additional booster doses.  Hepatitis A vaccine.** / Consult your health care provider.  Hepatitis B vaccine.** / Consult your health care provider.  Haemophilus influenzae type b (Hib) vaccine.** / Consult your health care provider. Ages 35 to 8  Blood pressure check.** / Every 1 to 2 years.  Lipid and cholesterol check.** / Every 5 years beginning at age 57.  Lung cancer screening. / Every year if you are aged 44-80 years and have a 30-pack-year history of smoking and currently smoke or have quit within the past 15 years. Yearly screening is stopped once you have quit smoking for at least 15 years or develop a health problem that would prevent you from having lung cancer treatment.  Fecal occult blood test (FOBT) of stool. / Every year beginning at age 55 and continuing until age 73. You may not have to do this test if you get a colonoscopy every 10 years.  Flexible sigmoidoscopy** or colonoscopy.** / Every 5 years for a flexible sigmoidoscopy or every 10 years for a colonoscopy beginning at age 28 and continuing until age  1.  Hepatitis C blood test.** / For all people born from [birth year] through 1965 and any individual with known risks for hepatitis C.  Skin self-exam. / Monthly.  Influenza vaccine. / Every  year.  Tetanus, diphtheria, and acellular pertussis (Tdap/Td) vaccine.** / Consult your health care provider. 1 dose of Td every 10 years.  Varicella vaccine.** / Consult your health care provider.  Zoster vaccine.** / 1 dose for adults aged 53 years or older.  Measles, mumps, rubella (MMR) vaccine.** / You need at least 1 dose of MMR if you were born in 1957 or later. You may also need a second dose.  Pneumococcal 13-valent conjugate (PCV13) vaccine.** / Consult your health care provider.  Pneumococcal polysaccharide (PPSV23) vaccine.** / 1 to 2 doses if you smoke cigarettes or if you have certain conditions.  Meningococcal vaccine.** / Consult your health care provider.  Hepatitis A vaccine.** / Consult your health care provider.  Hepatitis B vaccine.** / Consult your health care provider.  Haemophilus influenzae type b (Hib) vaccine.** / Consult your health care provider. Ages 77 and over  Blood pressure check.** / Every 1 to 2 years.  Lipid and cholesterol check.**/ Every 5 years beginning at age 85.  Lung cancer screening. / Every year if you are aged 55-80 years and have a 30-pack-year history of smoking and currently smoke or have quit within the past 15 years. Yearly screening is stopped once you have quit smoking for at least 15 years or develop a health problem that would prevent you from having lung cancer treatment.  Fecal occult blood test (FOBT) of stool. / Every year beginning at age 33 and continuing until age 11. You may not have to do this test if you get a colonoscopy every 10 years.  Flexible sigmoidoscopy** or colonoscopy.** / Every 5 years for a flexible sigmoidoscopy or every 10 years for a colonoscopy beginning at age 28 and continuing until age 73.  Hepatitis C blood  test.** / For all people born from [birth year] through 1965 and any individual with known risks for hepatitis C.  Abdominal aortic aneurysm (AAA) screening.** / A one-time screening for ages 50 to 27 years who are current or former smokers.  Skin self-exam. / Monthly.  Influenza vaccine. / Every year.  Tetanus, diphtheria, and acellular pertussis (Tdap/Td) vaccine.** / 1 dose of Td every 10 years.  Varicella vaccine.** / Consult your health care provider.  Zoster vaccine.** / 1 dose for adults aged 34 years or older.  Pneumococcal 13-valent conjugate (PCV13) vaccine.** / Consult your health care provider.  Pneumococcal polysaccharide (PPSV23) vaccine.** / 1 dose for all adults aged 63 years and older.  Meningococcal vaccine.** / Consult your health care provider.  Hepatitis A vaccine.** / Consult your health care provider.  Hepatitis B vaccine.** / Consult your health care provider.  Haemophilus influenzae type b (Hib) vaccine.** / Consult your health care provider. **Family history and personal history of risk and conditions may change your health care provider's recommendations. Document Released: 02/24/2001 Document Revised: 01/03/2013 Document Reviewed: 05/26/2010 [facility] Patient Information 2015 [place], [place]. This information is not intended to replace advice given to you by your health care provider. Make sure you discuss any questions you have with your health care provider.

## 2013-12-14 NOTE — Progress Notes (Signed)
Pre visit review using our clinic review tool, if applicable. No additional management support is needed unless otherwise documented below in the visit note. 

## 2013-12-14 NOTE — Therapy (Signed)
[facility] [address] [place], [place], [address] Phone: [phone]   Fax:  [phone]  Occupational Therapy Treatment  Patient Details  Name: Larry HammondJoshua Riley MRN: [id number] Date of Birth: 1980-04-09  Encounter Date: 12/14/2013      OT End of Session - 12/14/13 1152    Visit Number 2   Number of Visits 16   Date for OT Re-Evaluation 01/25/14   Activity Tolerance Patient tolerated treatment well      Past Medical History  Diagnosis Date  . Neurofibromatosis     tumor down spine and brain  . MRSA (methicillin resistant Staphylococcus aureus) 2005    leg    Past Surgical History  Procedure Laterality Date  . Neck surgery       tumor removal  . Hydrocephalus      [place]  . Spine surgery  10/2013    c2-t2 fusion  , laminectomy c1-6-- Dr [facility]d [facility]-- [place]  . Vasectomy  02/2013    There were no vitals taken for this visit.  Visit Diagnosis:  Lack of coordination  Decreased strength  Pain in joint, shoulder region, right      Subjective Assessment - 12/14/13 1147    Symptoms "My right shoulder realy hurts"   Pertinent History see epic snapshot   Currently in Pain? Yes   Pain Score 6    Pain Location Shoulder   Pain Orientation Right   Pain Descriptors / Indicators Sore;Tightness   Pain Onset More than a month ago   Pain Frequency Constant   Aggravating Factors  using UE   Pain Relieving Factors heat, meds            OT Treatments/Exercises (OP) - 12/14/13 0001    Exercises   Exercises Hand   Hand Exercises   Theraputty - Locate Pegs 20 pegs in red theraputty   Modalities   Modalities Moist Heat   Moist Heat Therapy   Number Minutes Moist Heat 10 Minutes   Moist Heat Location Shoulder  while simlutaneously working on pegs in theraputty   Manual Therapy   Manual Therapy Myofascial release   Myofascial Release Pt with significant tightness and trigger points in R and L shoulder although  right much more significant.  Addressed trigger points  and releasing soft tissue. At end of session pain at 3/5 and pt stated "this is the most free my shoulder has felt since before surgery"            OT Short Term Goals - 12/14/13 1153    OT SHORT TERM GOAL #1   Title Pt will be I with HEP - 12/27/2013   Time 4   Period Weeks   Status New   OT SHORT TERM GOAL #2   Title Pt will demonstrate improved coordination as evidenced by decreasing 9  hole peg test combined by 10 seconds to aide in functional tasks   Time 4   Period Weeks   Status New   OT SHORT TERM GOAL #3   Title Pateint will be mod I with butttoning    Time 4   Period Weeks   Status New   OT SHORT TERM GOAL #4   Title Pt will be mod I with tying shoes with AE prn   Time 4   Period Weeks   Status New          OT Long Term Goals - 12/14/13 1153    OT LONG TERM GOAL #1  Title Pt will be I with upgraded HEP prn - 01/25/2014   Time 8   Period Weeks   Status New   OT LONG TERM GOAL #2   Title Pt will demonstrate improved coordination by decreasing 9 hole peg test combined by 20 seconds to assist wtih functional activity   Time 8   Period Weeks   Status New   OT LONG TERM GOAL #3   Title Patient will be I with laundry   Time 8   Period Weeks   Status New   OT LONG TERM GOAL #4   Title Patient will be able to type with both hands within reasonable period of time in preparation to returning to work.   Time 8   Period Weeks   Status New   OT LONG TERM GOAL #5   Title Patient will be I with  meal prep at ambulatory level   Time 8   Period Weeks   Status New          Plan - 12/14/13 1152    Clinical Impression Statement Pt responded very well to moist heat and myofascial release to reduce pain/tightness and restrictions in both right and left shoulder.   Rehab Potential Good   Clinical Impairments Affecting Rehab Potential Pt will beneft form skilled OT to address the following:  decreased  bilateral coordination, decreased funcitonal use of BUE's, decreased strength BUE's   OT Frequency 2x / week   OT Duration 8 weeks   OT Treatment/Interventions Self-care/ADL training;Moist Heat;Neuromuscular education;Therapeutic exercise;DME and/or AE instruction;Functional Mobility Training;Manual Therapy;Therapeutic activities;Patient/family education;Balance training;Therapeutic exercises;Parrafin;Cryotherapy;Ultrasound;Fluidtherapy                               Problem List Patient Active Problem List   Diagnosis Date Noted  . Neurofibromatosis, type 1 12/14/2013  . Preventative health care 06/23/2010  . HORNER'S SYNDROME 04/12/2009   Mackie PaiKaren Pulaski, MS, OTR/L 12/14/2013 11:54 AM Phone: [phone] Fax: [phone]   Norton Pastelulaski, Karen Halliday 12/14/2013, 11:54 AM

## 2013-12-14 NOTE — Therapy (Signed)
[facility] [address] [place], [place], [address] Phone: [phone]   Fax:  [phone]  Physical Therapy Treatment  Patient Details  Name: Larry Riley MRN: [id number] Date of Birth: 02-07-1980  Encounter Date: 12/14/2013      PT End of Session - 12/14/13 1714    Visit Number 9   Number of Visits 20   Date for PT Re-Evaluation 12/15/13   Authorization Type UHC- visit limit 20 for PT, 20 for OT.   Authorization - Number of Visits 20   Equipment Utilized During Treatment Gait belt   Activity Tolerance Patient tolerated treatment well   Behavior During Therapy WFL for tasks assessed/performed      Past Medical History  Diagnosis Date  . Neurofibromatosis     tumor down spine and brain  . MRSA (methicillin resistant Staphylococcus aureus) 2005    leg    Past Surgical History  Procedure Laterality Date  . Neck surgery       tumor removal  . Hydrocephalus      [place] 1999[place]  . Spine surgery  10/2013    c2-t2 fusion  , laminectomy c1-6-- Dr Rigoberto Noel-- [place]  . Vasectomy  02/2013    There were no vitals taken for this visit.  Visit Diagnosis:  Decreased strength  Abnormality of gait      Subjective Assessment - 12/14/13 1654    Symptoms pt. has no c/o's - states neck feels better since having had OT this morning   Pertinent History von Reckinhausens disease;    Patient Stated Goals Increase LLE strength and improve gait and balance   Currently in Pain? Yes   Pain Score 4    Pain Location Neck   Pain Orientation Right   Pain Descriptors / Indicators Sore;Tightness   Pain Type Surgical pain   Pain Onset More than a month ago   Multiple Pain Sites No            [facility] Adult PT Treatment/Exercise - 12/14/13 1659    Ambulation/Gait   Ambulation/Gait Yes   Ambulation/Gait Assistance 5: Supervision   Ambulation/Gait Assistance Details toe off AFO used on LLE   Ambulation Distance (Feet) 120 Feet   Assistive device None   Gait Pattern Decreased dorsiflexion - left   Lumbar Exercises: Machines for Strengthening   Leg Press  seat 13   Other Lumbar Machine Exercise bridging x 5 reps; bridging with hip abdct/adduction 5 reps; bridging with marching x 10 reps; bridging with LE extension x 10 reps;    Knee/Hip Exercises: Aerobic   Elliptical 1" forward; 35 secs backward   Knee/Hip Exercises: Supine   Heel Slides 10 reps  LLE 3# weight   Straight Leg Raises 10 reps  3# used on LLE   Knee Flexion 10 reps  3# weight on LLE   Ankle Exercises: Standing   Toe Raise 10 reps  bil. LE's 10:  LLE only 7 reps     Amb. 20' tandem gait with min.assist to CGA  With no device. Pt. Performed sidestepping with red theraband with squats  20' x 2 reps for LE strengthening.     PT Education - 12/14/13 1713    Education provided Yes   Education Details HEP for LLE hip strengthening - SLR, hip abdct., knee flexion and hip extension   Person(s) Educated Patient;Spouse   Methods Explanation;Demonstration;Handout   Comprehension Verbalized understanding;Returned demonstration          PT Short Term Goals -  12/14/13 1716    PT SHORT TERM GOAL #1   Title be independent with initial HEP for lower extremity strengthening and balance training (12/15/13)   Baseline met 12-14-13   Status Achieved   PT SHORT TERM GOAL #2   Title timed up and go score <=23 seconds (12/15/13)   Baseline to be assessed next visit   Status On-going   PT SHORT TERM GOAL #3   Title ambulate with a cane with minimal  (12/15/13)   Status Achieved   PT SHORT TERM GOAL #4   Title perfome home activities with >25% greater ease (12/15/13)   Status Achieved            Plan - 12/14/13 1714    Clinical Impression Statement pt. continues to improve with balance and gait; able to tolerate lying on right side to perform L hip abdct.; continues to fatigue easily   Pt will benefit from skilled therapeutic intervention in order to  improve on the following deficits Abnormal gait;Decreased coordination;Pain;Difficulty walking;Decreased balance;Decreased strength;Decreased range of motion;Decreased activity tolerance;Decreased endurance   PT Frequency 3x / week   PT Duration 8 weeks   PT Treatment/Interventions ADLs/Self Care Home Management;Therapeutic activities;Therapeutic exercise;Gait training;Balance training;Stair training;Neuromuscular re-education;Energy conservation;Functional mobility training   PT Next Visit Plan continue gait; instruct in HEP for strengthening; will request script from MD for [place] AFO with T strap   PT Home Exercise Plan as instructed with additional hip exercises   Recommended Other Services AFO script requested from Dr. Katherina Right and Agree with Plan of Care Patient   Family Member Consulted spouse                               Problem List Patient Active Problem List   Diagnosis Date Noted  . Neurofibromatosis, type 1 12/14/2013  . Preventative health care 06/23/2010  . HORNER'S SYNDROME 04/12/2009    Alda Lea 12/14/2013, 5:20 PM  Guido Sander, [place] [address], [place], [place] [address] [phone]

## 2013-12-16 LAB — URINE CULTURE

## 2013-12-18 ENCOUNTER — Encounter: Payer: Self-pay | Admitting: Occupational Therapy

## 2013-12-18 ENCOUNTER — Ambulatory Visit: Payer: 59 | Admitting: Occupational Therapy

## 2013-12-18 ENCOUNTER — Ambulatory Visit: Payer: 59 | Admitting: Physical Therapy

## 2013-12-18 ENCOUNTER — Encounter: Payer: Self-pay | Admitting: Physical Therapy

## 2013-12-18 DIAGNOSIS — R269 Unspecified abnormalities of gait and mobility: Secondary | ICD-10-CM

## 2013-12-18 DIAGNOSIS — R531 Weakness: Secondary | ICD-10-CM

## 2013-12-18 DIAGNOSIS — R279 Unspecified lack of coordination: Secondary | ICD-10-CM

## 2013-12-18 DIAGNOSIS — Z9889 Other specified postprocedural states: Secondary | ICD-10-CM | POA: Diagnosis not present

## 2013-12-18 DIAGNOSIS — M25511 Pain in right shoulder: Secondary | ICD-10-CM

## 2013-12-18 NOTE — Therapy (Signed)
[facility] [address] [place], [place], [address] Phone: [phone]   Fax:  [phone]  Physical Therapy Treatment  Patient Details  Name: Larry Riley MRN: [id number] Date of Birth: [birth year]  Encounter Date: 12/18/2013      PT End of Session - 12/18/13 2104    Visit Number 10   Number of Visits 20   Date for PT Re-Evaluation 01/14/14   Authorization Type UHC- visit limit 20 for PT, 20 for OT.   Authorization - Number of Visits 20   PT Start Time 1403   PT Stop Time 1447   PT Time Calculation (min) 44 min   Equipment Utilized During Treatment Gait belt   Activity Tolerance Patient tolerated treatment well      Past Medical History  Diagnosis Date  . Neurofibromatosis     tumor down spine and brain  . MRSA (methicillin resistant Staphylococcus aureus) 2005    leg    Past Surgical History  Procedure Laterality Date  . Neck surgery       tumor removal  . Hydrocephalus      [place]  . Spine surgery  10/2013    c2-t2 fusion  , laminectomy c1-6-- Dr Raynald [facility]emp[facility]-- [place]  . Vasectomy  02/2013    There were no vitals taken for this visit.  Visit Diagnosis:  Decreased strength  Abnormality of gait      Subjective Assessment - 12/18/13 2055    Symptoms pt. reports doing better - no new c/o's   Patient Stated Goals Increase LLE strength and improve gait and balance   Currently in Pain? Yes   Pain Score 5    Pain Location Neck   Pain Descriptors / Indicators Aching;Burning   Pain Type Surgical pain   Pain Onset More than a month ago   Pain Frequency Constant   Aggravating Factors  prolonged sitting   Pain Relieving Factors cold, heat, medications   Multiple Pain Sites Yes   Pain Score 4   Pain Type Surgical pain   Pain Location Shoulder   Pain Orientation Right   Pain Descriptors / Indicators Aching   Pain Frequency Intermittent            [facility] Adult PT Treatment/Exercise - 12/18/13 1410     Ambulation/Gait   Ambulation/Gait Yes   Ambulation/Gait Assistance 5: Supervision  CGA   Ambulation Distance (Feet) 120 Feet   Assistive device None   Lumbar Exercises: Aerobic   Elliptical 1" forward; 1" backward level 1.2   Lumbar Exercises: Standing   Heel Raises 10 reps  10 reps off hi/lo table LLE with R foot on balance bubble   Knee/Hip Exercises: Machines for Strengthening   Cybex Leg Press bil. LE's 85# 15 reps; LLE only 65# 20 reps   Knee/Hip Exercises: Standing   Forward Step Up Left;10 reps   Step Down Left;10 reps   Functional Squat 10 reps  LLE only with bil. UE support   Knee/Hip Exercises: Seated   Other Seated Knee Exercises seated left knee flexion with green therband x 15 reps   Knee/Hip Exercises: Prone   Hamstring Curl 10 reps  LLE   Hip Extension 10 reps;AROM  with left knee flexed at 90 degrees            PT Short Term Goals - 12/18/13 2107    PT SHORT TERM GOAL #2   Title timed up and go score <=23 seconds (12/15/13)   Baseline score 11  secs with no device   Status Achieved            Plan - 12/18/13 2105    Clinical Impression Statement pt. continues to increase LLE strength bu continues to demo decr. muscle endurance in LLE as leg fatigues quickly   Pt will benefit from skilled therapeutic intervention in order to improve on the following deficits Abnormal gait;Decreased coordination;Pain;Difficulty walking;Decreased balance;Decreased strength;Decreased range of motion;Decreased activity tolerance;Decreased endurance   Rehab Potential Good   PT Frequency 3x / week   PT Duration 8 weeks   PT Treatment/Interventions ADLs/Self Care Home Management;Therapeutic activities;Therapeutic exercise;Gait training;Balance training;Stair training;Neuromuscular re-education;Energy conservation;Functional mobility training   PT Next Visit Plan continue gait; instruct in HEP for strengthening; will request script from MD for [place] AFO with T strap   PT  Home Exercise Plan as instructed with additional hip exercises   Recommended Other Services script obtained - pt instructed to call [facility] for orthotic consult   Consulted and Agree with Plan of Care Patient                               Problem List Patient Active Problem List   Diagnosis Date Noted  . Neurofibromatosis, type 1 12/14/2013  . Preventative health care 06/23/2010  . [facility]    Larry Riley 12/18/2013, 9:13 PM   Kerry FortSuzanne Adonica Riley, PT [facility] [address] [place], [place] [address] [phone]

## 2013-12-18 NOTE — Patient Instructions (Signed)
Add to current theraputty exercises: Do both hands  Gross opposition 1.  Make a ball 2.  Make a pancake 3.  Make a cone Repeat 3 times  Intrinsic Function 4.  Pull taffy Do 10 times  Gross Finger Extension 5.  Wrap around your fingers and thumb and stretch wide open Do 10 times  STOP if you experience any pain in your shoulders, neck or hands.  Let your therapist know.

## 2013-12-18 NOTE — Therapy (Signed)
[facility] [address] [place], [place], [address] Phone: [phone]   Fax:  [phone]  Occupational Therapy Treatment  Patient Details  Name: Larry Riley MRN: [id number] Date of Birth: Jun 15, 1980  Encounter Date: 12/18/2013      OT End of Session - 12/18/13 1631    Visit Number 3   Number of Visits 16   Date for OT Re-Evaluation 01/25/14   OT Start Time 1316   OT Stop Time 1359   OT Time Calculation (min) 43 min   Activity Tolerance Patient tolerated treatment well      Past Medical History  Diagnosis Date  . Neurofibromatosis     tumor down spine and brain  . MRSA (methicillin resistant Staphylococcus aureus) 2005    leg    Past Surgical History  Procedure Laterality Date  . Neck surgery       tumor removal  . Hydrocephalus      [place]  . Spine surgery  10/2013    c2-t2 fusion  , laminectomy c1-6-- Dr Raynald KempHSU-- [place]  . Vasectomy  02/2013    There were no vitals taken for this visit.  Visit Diagnosis:  No diagnosis found.      Subjective Assessment - 12/18/13 1324    Symptoms "I can finally sleep on my right side since you worked on my shoulder"   Pertinent History see epic snapshot   Pain Score 5    Pain Location Neck   Pain Descriptors / Indicators Aching;Burning   Pain Type Surgical pain   Pain Onset More than a month ago   Pain Frequency Constant   Aggravating Factors  sitting for long periods of time, typing, using arms   Pain Relieving Factors cold pack, meds,heat, rest   Multiple Pain Sites Yes   Pain Score 4   Pain Location Shoulder   Pain Orientation Right   Pain Descriptors / Indicators Aching  dull ache   Pain Frequency Intermittent            OT Treatments/Exercises (OP) - 12/18/13 1401    Exercises   Exercises Hand   Hand Exercises   Theraputty Flatten;Roll;Grip;Pinch;Locate Pegs  added to HEP (red); 15 pegs in green/board   Other Hand Exercises 9 hole peg R=  43.06  L= 30.09          OT Education - 12/18/13 1357    Education Details added exercises to HEP for theraputty   Person(s) Educated Patient   Methods Explanation;Demonstration;Handout   Comprehension Verbalized understanding;Returned demonstration          OT Short Term Goals - 12/18/13 1634    OT SHORT TERM GOAL #1   Title Pt will be I with HEP - 12/27/2013   Time 4   Period Weeks   Status On-going   OT SHORT TERM GOAL #2   Title Pt will demonstrate improved coordination as evidenced by decreasing 9  hole peg test combined by 10 seconds to aide in functional tasks   Time 4   Period Weeks   Status Achieved   OT SHORT TERM GOAL #3   Title Pateint will be mod I with butttoning    Time 4   Period Weeks   Status Achieved   OT SHORT TERM GOAL #4   Title Pt will be mod I with tying shoes with AE prn   Time 4   Period Weeks   Status Achieved  OT Long Term Goals - 12/18/13 1635    OT LONG TERM GOAL #1   Title Pt will be I with upgraded HEP prn - 01/25/2014   Time 8   Period Weeks   Status New   OT LONG TERM GOAL #2   Title Pt will demonstrate improved coordination by decreasing 9 hole peg test combined by 20 seconds to assist wtih functional activity   Time 8   Period Weeks   Status New   OT LONG TERM GOAL #3   Title Patient will be I with laundry   Time 8   Period Weeks   Status New   OT LONG TERM GOAL #4   Title Patient will be able to type with both hands within reasonable period of time in preparation to returning to work.   Time 8   Period Weeks   Status New   OT LONG TERM GOAL #5   Title Patient will be I with  meal prep at ambulatory level   Time 8   Period Weeks   Status New          Plan - 12/18/13 1632    Clinical Impression Statement Upgraded HEP for theraputty to add additional exercises;  encouraged patient to continue to work at home as consistently as possible. Pt in agreement.   Rehab Potential Good   Clinical Impairments  Affecting Rehab Potential Pt will beneft form skilled OT to address the following:  decreased bilateral coordination, decreased funcitonal use of BUE's, decreased strength BUE's   OT Frequency 2x / week   OT Duration 8 weeks   OT Treatment/Interventions Self-care/ADL training;Moist Heat;Neuromuscular education;Therapeutic exercise;DME and/or AE instruction;Functional Mobility Training;Manual Therapy;Therapeutic activities;Patient/family education;Balance training;Therapeutic exercises;Parrafin;Cryotherapy;Ultrasound;Fluidtherapy   Plan check upgraded HEP; addresss shoulder flexion/tightness/pain/ROM                               Problem List Patient Active Problem List   Diagnosis Date Noted  . Neurofibromatosis, type 1 12/14/2013  . Preventative health care 06/23/2010  . HORNER'S SYNDROME 04/12/2009   Mackie PaiKaren Pulaski, MS, OTR/L 12/18/2013 4:36 PM Phone: [phone] Fax: [phone]   Norton Pastelulaski, Karen Halliday 12/18/2013, 4:35 PM

## 2013-12-19 ENCOUNTER — Ambulatory Visit: Payer: 59 | Admitting: Physical Therapy

## 2013-12-19 ENCOUNTER — Ambulatory Visit: Payer: 59 | Admitting: Occupational Therapy

## 2013-12-19 ENCOUNTER — Encounter: Payer: Self-pay | Admitting: Physical Therapy

## 2013-12-19 ENCOUNTER — Encounter: Payer: Self-pay | Admitting: Occupational Therapy

## 2013-12-19 DIAGNOSIS — R531 Weakness: Secondary | ICD-10-CM

## 2013-12-19 DIAGNOSIS — M25511 Pain in right shoulder: Secondary | ICD-10-CM

## 2013-12-19 DIAGNOSIS — R269 Unspecified abnormalities of gait and mobility: Secondary | ICD-10-CM

## 2013-12-19 DIAGNOSIS — R279 Unspecified lack of coordination: Secondary | ICD-10-CM

## 2013-12-19 DIAGNOSIS — Z9889 Other specified postprocedural states: Secondary | ICD-10-CM | POA: Diagnosis not present

## 2013-12-19 NOTE — Therapy (Signed)
[facility] [address] [place], [place], [address] Phone: [phone]   Fax:  [phone]  Physical Therapy Treatment  Patient Details  Name: Larry HammondJoshua Jefferys MRN: [id number] Date of Birth: Jul 29, 1980  Encounter Date: 12/19/2013      PT End of Session - 12/19/13 1312    Visit Number 11   Number of Visits 20   Date for PT Re-Evaluation 01/14/14   Authorization Type UHC- visit limit 20 for PT, 20 for OT.   Authorization - Number of Visits 20   PT Start Time 1145   PT Stop Time 1235   PT Time Calculation (min) 50 min   Equipment Utilized During Treatment Gait belt   Activity Tolerance Patient tolerated treatment well      Past Medical History  Diagnosis Date  . Neurofibromatosis     tumor down spine and brain  . MRSA (methicillin resistant Staphylococcus aureus) 2005    leg    Past Surgical History  Procedure Laterality Date  . Neck surgery       tumor removal  . Hydrocephalus      [place]  . Spine surgery  10/2013    c2-t2 fusion  , laminectomy c1-6-- [facility]-- [facility]  . Vasectomy  02/2013    There were no vitals taken for this visit.  Visit Diagnosis:  Decreased strength  Abnormality of gait      Subjective Assessment - 12/19/13 1300    Symptoms Pt. reports being fatigued yesterday evening after therapy yesterday; called [facility] and was informed he couldn't get appt. for orthotic fitting before end of January   Pertinent History von Reckinhausens disease;    Patient Stated Goals Increase LLE strength and improve gait and balance   Pain Score 4    Pain Location Shoulder   Pain Orientation Right   Pain Descriptors / Indicators Tightness   Pain Type Chronic pain   Pain Onset More than a month ago   Pain Frequency Constant            [facility] Adult PT Treatment/Exercise - 12/19/13 1305    Ambulation/Gait   Ambulation/Gait Yes   Ambulation/Gait Assistance 5: Supervision   Ambulation/Gait  Assistance Details 120   Assistive device None   Dynamic Standing Balance   Dynamic Standing - Comments tapping cones with RLE/LLE and then tipping over and back up for improved single limb stance and coordination   Lumbar Exercises: Aerobic   Elliptical 1" forward; 45 secs backward   Lumbar Exercises: Machines for Strengthening   Leg Press 85# bil. LE's 10 resp: 65# LLE only 20 reps   Lumbar Exercises: Standing   Heel Raises 10 reps   Functional Squats 10 reps  LLE only   Other Standing Lumbar Exercises L hip flexion, extension, abduction with green theraband x 10 reps each direction   Knee/Hip Exercises: Stretches   Active Hamstring Stretch 1 rep;30 seconds  LLE - runner's stretch   Knee/Hip Exercises: Seated   Other Seated Knee Exercises seated L knee flexion with green theraband x 10 reps   Ankle Exercises: Standing   Toe Raise 10 reps  LLE only   Braiding (Round Trip) crossovers 10x 4 inside parallel bars; stepping behind 10' x2 inside bars with UE support prn   Other Standing Ankle Exercises standing on BOSU     Step ups LLE x 10 reps and step down exercise 10 reps LLE with bil. UE support for each exercise. Pt. Performed weight shifts standing  on BOSU with minimal UE support and then performed RLE hip flexion/extension and abduction/adduction for left single limb stance.      PT Short Term Goals - 12/18/13 2107    PT SHORT TERM GOAL #2   Title timed up and go score <=23 seconds (12/15/13)   Baseline score 11 secs with no device   Status Achieved            Plan - 12/19/13 1313    Clinical Impression Statement pt. continues to progress with balance and gait - able to amb. in clinic without use of RW but has decr. balance with fatigue   Pt will benefit from skilled therapeutic intervention in order to improve on the following deficits Abnormal gait;Decreased coordination;Pain;Difficulty walking;Decreased balance;Decreased strength;Decreased range of motion;Decreased  activity tolerance;Decreased endurance   Rehab Potential Good   PT Duration 8 weeks   PT Treatment/Interventions ADLs/Self Care Home Management;Therapeutic activities;Therapeutic exercise;Gait training;Balance training;Stair training;Neuromuscular re-education;Energy conservation;Functional mobility training   PT Next Visit Plan continue gait, balance and strengthening exercises   Consulted and Agree with Plan of Care Patient   Family Member Consulted spouse                               Problem List Patient Active Problem List   Diagnosis Date Noted  . Neurofibromatosis, type 1 12/14/2013  . Preventative health care 06/23/2010  . HORNER'S SYNDROME 04/12/2009    Larry Riley, Larry Riley 12/19/2013, 3:59 PM   Kerry FortSuzanne Dekker Verga, PT [facility] [address] [place], [place] [address] [phone]

## 2013-12-19 NOTE — Therapy (Signed)
[facility] [address] [place], [place], [address] Phone: [phone]   Fax:  [phone]  Occupational Therapy Treatment  Patient Details  Name: Larry HammondJoshua Riley MRN: [id number] Date of Birth: Apr 08, 1980  Encounter Date: 12/19/2013      OT End of Session - 12/19/13 1243    Visit Number 6   Number of Visits 16   Date for OT Re-Evaluation 01/25/14   OT Start Time 1102   OT Stop Time 1145   OT Time Calculation (min) 43 min   Activity Tolerance Patient tolerated treatment well      Past Medical History  Diagnosis Date  . Neurofibromatosis     tumor down spine and brain  . MRSA (methicillin resistant Staphylococcus aureus) 2005    leg    Past Surgical History  Procedure Laterality Date  . Neck surgery       tumor removal  . Hydrocephalus      [place] 1999[place]  . Spine surgery  10/2013    c2-t2 fusion  , laminectomy c1-6-- [facility]-- [facility]  . Vasectomy  02/2013    There were no vitals taken for this visit.  Visit Diagnosis:  Lack of coordination  Decreased strength  Pain in joint, shoulder region, right      Subjective Assessment - 12/19/13 1108    Symptoms My shoulder gets a tighter with activity but overall it is much better   Pertinent History see epic snapshot   Currently in Pain? Yes   Pain Score 4    Pain Location Shoulder   Pain Orientation Right   Pain Type Chronic pain   Pain Onset More than a month ago   Pain Frequency Constant   Aggravating Factors  prolonged use of RUE   Pain Relieving Factors cold, heat, meds   Multiple Pain Sites Yes   Pain Score 5   Pain Location Neck   Pain Orientation Posterior   Pain Descriptors / Indicators Aching   Pain Frequency Intermittent            OT Treatments/Exercises (OP) - 12/19/13 1240    Modalities   Modalities Moist Heat   Moist Heat Therapy   Number Minutes Moist Heat 10 Minutes   Moist Heat Location Shoulder  bilaterally.  Modality  simultaneous with manual therapy   Manual Therapy   Manual Therapy Myofascial release;Scapular mobilization;Manual Traction  R/L shoulder.   Myofascial Release addressed trigger points, soft tissue release to decrease pain, increase ROM and functional overhead use of BUE's.   Scapular Mobilization increase scap mobilization to allow increased ROM/decreased pain for overhead reach.   Manual Traction gentle to B shoulders            OT Short Term Goals - 12/19/13 1246    OT SHORT TERM GOAL #1   Title Pt will be I with HEP - 12/27/2013   Time 4   Period Weeks   Status On-going   OT SHORT TERM GOAL #2   Title Pt will demonstrate improved coordination as evidenced by decreasing 9  hole peg test combined by 10 seconds to aide in functional tasks   Time 4   Period Weeks   Status Achieved   OT SHORT TERM GOAL #3   Title Pateint will be mod I with butttoning    Time 4   Period Weeks   Status Achieved   OT SHORT TERM GOAL #4   Title Pt will be mod I with tying shoes with AE  prn   Time 4   Period Weeks   Status Achieved          OT Long Term Goals - 12/19/13 1246    OT LONG TERM GOAL #1   Title Pt will be I with upgraded HEP prn - 01/25/2014   Time 8   Period Weeks   Status New   OT LONG TERM GOAL #2   Title Pt will demonstrate improved coordination by decreasing 9 hole peg test combined by 20 seconds to assist wtih functional activity   Time 8   Period Weeks   Status New   OT LONG TERM GOAL #3   Title Patient will be I with laundry   Time 8   Period Weeks   Status New   OT LONG TERM GOAL #4   Title Patient will be able to type with both hands within reasonable period of time in preparation to returning to work.   Time 8   Period Weeks   Status New   OT LONG TERM GOAL #5   Title Patient will be I with  meal prep at ambulatory level   Time 8   Period Weeks   Status New          Plan - 12/19/13 1244    Clinical Impression Statement Pt with good results to  heat and manual therapy to B shoulders with decreased pain and tightness at end of session.   Rehab Potential Good   Clinical Impairments Affecting Rehab Potential Pt will beneft form skilled OT to address the following:  decreased bilateral coordination, decreased funcitonal use of BUE's, decreased strength BUE's   OT Frequency 2x / week   OT Duration 8 weeks   OT Treatment/Interventions Self-care/ADL training;Moist Heat;Neuromuscular education;Therapeutic exercise;DME and/or AE instruction;Functional Mobility Training;Manual Therapy;Therapeutic activities;Patient/family education;Balance training;Therapeutic exercises;Parrafin;Cryotherapy;Ultrasound;Fluidtherapy   Plan teach gentle stretch program for shoulders, coordination                               Problem List Patient Active Problem List   Diagnosis Date Noted  . Neurofibromatosis, type 1 12/14/2013  . Preventative health care 06/23/2010  . HORNER'S SYNDROME 04/12/2009   Mackie PaiKaren Pulaski, MS, OTR/L 12/19/2013 12:47 PM Phone: [phone] Fax: [phone]   Norton Pastelulaski, Karen Halliday 12/19/2013, 12:47 PM

## 2013-12-21 ENCOUNTER — Encounter: Payer: Self-pay | Admitting: Physical Therapy

## 2013-12-21 ENCOUNTER — Ambulatory Visit: Payer: 59 | Admitting: Physical Therapy

## 2013-12-21 DIAGNOSIS — R531 Weakness: Secondary | ICD-10-CM

## 2013-12-21 DIAGNOSIS — R269 Unspecified abnormalities of gait and mobility: Secondary | ICD-10-CM

## 2013-12-21 DIAGNOSIS — Z9889 Other specified postprocedural states: Secondary | ICD-10-CM | POA: Diagnosis not present

## 2013-12-21 DIAGNOSIS — R279 Unspecified lack of coordination: Secondary | ICD-10-CM

## 2013-12-21 NOTE — Therapy (Signed)
[facility] [address] [place], [place], [address] Phone: [phone]   Fax:  [phone]  Physical Therapy Treatment  Patient Details  Name: Larry Riley MRN: [id number] Date of Birth: Mar 25, 1980  Encounter Date: 12/21/2013      PT End of Session - 12/21/13 1108    Visit Number 12   Number of Visits 20   Date for PT Re-Evaluation 01/14/14   Authorization Type UHC- visit limit 20 for PT, 20 for OT.   PT Start Time 0805   PT Stop Time 0846   PT Time Calculation (min) 41 min   Equipment Utilized During Treatment Gait belt   Activity Tolerance Patient tolerated treatment well      Past Medical History  Diagnosis Date  . Neurofibromatosis     tumor down spine and brain  . MRSA (methicillin resistant Staphylococcus aureus) 2005    leg    Past Surgical History  Procedure Laterality Date  . Neck surgery       tumor removal  . Hydrocephalus      [place] 1999[place]  . Spine surgery  10/2013    c2-t2 fusion  , laminectomy c1-6-- Dr [facility]-- [facility]  . Vasectomy  02/2013    There were no vitals taken for this visit.  Visit Diagnosis:  Decreased strength  Lack of coordination  Abnormality of gait      Subjective Assessment - 12/21/13 1055    Symptoms pt. states he was really sore in shoulders and neck yesterday but is feeling better today; "may have slept in wrong position"   Pertinent History von Reckinhausens disease;    Patient Stated Goals Increase LLE strength and improve gait and balance   Pain Descriptors / Indicators Throbbing;Tightness            [facility] Adult PT Treatment/Exercise - 12/21/13 0814    Ambulation/Gait   Ambulation/Gait Yes   Ambulation/Gait Assistance 5: Supervision   Ambulation Distance (Feet) 175 Feet   Assistive device None   Dynamic Standing Balance   Dynamic Standing - Comments standing on BOSU; inside parallel bars - with horizontal head turns with minimal UE support; lifting RLE  forward/backward and laterally in abduction for improved L single limb stance; standing on blue foam for balance training on compliant surface ; stepping down with RLE to floort with UE support prn   Exercises   Exercises Lumbar;Knee/Hip;Ankle;Balance   Lumbar Exercises: Aerobic   Elliptical 1" forward; 1" backward level 1.6   Lumbar Exercises: Machines for Strengthening   Leg Press 90# bil. LE's x 20 reps; LLE only 65#  incr. both by 5# next session   Other Lumbar Machine Exercise bridging x 5 reps; bridging with hip abdct/adduction 5 reps; bridging with marching x 10 reps; bridging with LE extension x 10 reps;    Knee/Hip Exercises: Standing   Other Standing Knee Exercises left knee extension control exercise with blue theraband - 10 reps in bilateral stance, R foot forward stance and R foot backward stance for L knee control in alll phases of gait cycle   Knee/Hip Exercises: Seated   Other Seated Knee Exercises seated left knee flexion with blue theraband x 10 reps each with 3 sec hold   Knee/Hip Exercises: Prone   Hamstring Curl 10 reps   Ankle Exercises: Standing   Heel Raises 20 reps  off hi/lo table with R foot on 4" step on top of balance bub   Heel Raises Limitations weakness in L plantarflexors  Toe Walk (Round Trip) 5'   Balance Beam --  left knee hyperextended when amb. with L foot dorsiflexed          PT Education - 12/21/13 1108    Education provided Yes   Education Details called [facility] for AFO appt. - was informed it would be the 1st of Jan. due to them not taking new pts at this time   Person(s) Educated Patient;Spouse   Methods Explanation   Comprehension Verbalized understanding            PT Long Term Goals - 12/21/13 1132    PT LONG TERM GOAL #1   Title demonstrate and /or verbalized techniques to redue the risk of re-inury to inclued info on : physical activity (01/12/14)   Baseline 01-17-14   Time 4   Period Weeks   Status On-going   PT LONG TERM  GOAL #2   Title be independent with advanced HEP for strengthening (01/12/14)   Baseline 01-17-14   Time 4   Period Weeks   Status On-going   PT LONG TERM GOAL #3   Title timed up and go score <15 seconds (01/12/14)   Baseline 01-17-14   Time 4   Period Weeks   Status On-going   PT LONG TERM GOAL #4   Title return to work due to increased bilateral lower extremity and increased endurance (01/12/14)   Baseline 01-17-14   Time 4   Period Weeks   Status On-going          Plan - 12/21/13 1122    Clinical Impression Statement pt. progressing well with gait and balance; increased left foot slap noted with fatigue due to decr. left eccentric dorsiflexion strength   Pt will benefit from skilled therapeutic intervention in order to improve on the following deficits Abnormal gait;Decreased coordination;Pain;Difficulty walking;Decreased balance;Decreased strength;Decreased range of motion;Decreased activity tolerance;Decreased endurance   Rehab Potential Good   PT Frequency 3x / week   PT Duration 3 weeks   PT Treatment/Interventions ADLs/Self Care Home Management;Therapeutic activities;Therapeutic exercise;Gait training;Balance training;Stair training;Neuromuscular re-education;Energy conservation;Functional mobility training   PT Next Visit Plan ;increase weight on leg press to 90 and 70#   PT Home Exercise Plan as instructed with additional hip exercises   Consulted and Agree with Plan of Care Patient   Family Member Consulted spouse                               Problem List Patient Active Problem List   Diagnosis Date Noted  . Neurofibromatosis, type 1 12/14/2013  . Preventative health care 06/23/2010  . [facility] 04/12/2009    Kary KosDilday, Dustine Bertini Suzanne 12/21/2013, 11:38 AM     Kerry FortSuzanne Leza Apsey, PT [facility] [address] [place], [place] [address] [phone]

## 2013-12-25 ENCOUNTER — Ambulatory Visit: Payer: 59 | Admitting: Physical Therapy

## 2013-12-25 ENCOUNTER — Encounter: Payer: Self-pay | Admitting: Occupational Therapy

## 2013-12-25 ENCOUNTER — Ambulatory Visit: Payer: 59 | Admitting: Occupational Therapy

## 2013-12-25 DIAGNOSIS — M25511 Pain in right shoulder: Secondary | ICD-10-CM

## 2013-12-25 DIAGNOSIS — R279 Unspecified lack of coordination: Secondary | ICD-10-CM

## 2013-12-25 DIAGNOSIS — R269 Unspecified abnormalities of gait and mobility: Secondary | ICD-10-CM

## 2013-12-25 DIAGNOSIS — Z9889 Other specified postprocedural states: Secondary | ICD-10-CM | POA: Diagnosis not present

## 2013-12-25 DIAGNOSIS — R531 Weakness: Secondary | ICD-10-CM

## 2013-12-25 NOTE — Therapy (Signed)
[facility] [address] [place], [place], [address] Phone: [phone]   Fax:  [phone]  Physical Therapy Treatment  Patient Details  Name: Larry HammondJoshua Riley MRN: [id number] Date of Birth: 1980/03/01  Encounter Date: 12/25/2013      PT End of Session - 12/25/13 1639    Visit Number 13   Number of Visits 20   Date for PT Re-Evaluation 01/14/14   Authorization Type UHC- visit limit 20 for PT, 20 for OT.   Authorization - Number of Visits 20   PT Start Time 1403   PT Stop Time 1447   PT Time Calculation (min) 44 min   Equipment Utilized During Treatment Gait belt   Activity Tolerance Patient tolerated treatment well      Past Medical History  Diagnosis Date  . Neurofibromatosis     tumor down spine and brain  . MRSA (methicillin resistant Staphylococcus aureus) 2005    leg    Past Surgical History  Procedure Laterality Date  . Neck surgery       tumor removal  . Hydrocephalus      [place]  . Spine surgery  10/2013    c2-t2 fusion  , laminectomy c1-6-- [facility]-- [facility]  . Vasectomy  02/2013    There were no vitals taken for this visit.  Visit Diagnosis:  Decreased strength  Abnormality of gait      Subjective Assessment - 12/25/13 1628    Symptoms pt. states he has appt. this pm at [facility] for LLE   Pertinent History von Reckinhausens disease;    Patient Stated Goals Increase LLE strength and improve gait and balance   Currently in Pain? Yes   Pain Score 4    Pain Location Shoulder   Pain Orientation Right   Pain Descriptors / Indicators Throbbing;Aching;Tightness   Pain Type Surgical pain   Pain Onset More than a month ago   Multiple Pain Sites No            [facility] Adult PT Treatment/Exercise - 12/25/13 1629    High Level Balance   High Level Balance Activities Braiding   High Level Balance Comments standing on BOSU; weight shifts anterior and posteriorly; moving RLE up/back and  out to side for L single limb stance   Lumbar Exercises: Aerobic   Elliptical 1" forward; 1" backward level 1.6   Lumbar Exercises: Machines for Strengthening   Leg Press 90# bil. LE's 20 reps; LLE 70# 20 reps   Knee/Hip Exercises: Standing   Forward Step Up 10 reps   Step Down 10 reps   Other Standing Knee Exercises standing L knee extension control exercise with blue theraband x 10 reps each position of RLE - bilateral stance, forward stance and backward stance   Other Standing Knee Exercises sit to stand with R foot on balance bubble x 10 reps   Ankle Exercises: Standing   Heel Raises 10 reps  20 reps off hi/lo table in closed chain   Heel Raises Limitations weakness in left plantarflexors              PT Long Term Goals - 12/25/13 1643    PT LONG TERM GOAL #1   Title demonstrate and /or verbalized techniques to redue the risk of re-inury to inclued info on : physical activity (01/12/14)   Baseline 01-17-14   Time 3   Period Weeks   Status On-going   PT LONG TERM GOAL #2   Title  be independent with advanced HEP for strengthening (01/12/14)   Baseline 01-17-14   Time 3   Period Weeks   Status On-going   PT LONG TERM GOAL #3   Title timed up and go score <15 seconds (01/12/14)   Baseline 01-17-14   Time 3   Period Weeks   Status On-going   PT LONG TERM GOAL #4   Title return to work due to increased bilateral lower extremity and increased endurance (01/12/14)   Baseline 01-17-14   Time 3   Period Weeks   Status On-going          Plan - 12/25/13 1640    Clinical Impression Statement Pt. continues to progress well; LLE has foot slap due to decr. eccentric dorsiflexion strength which increases with fatigue; pt. to be fitted for AFO    Pt will benefit from skilled therapeutic intervention in order to improve on the following deficits Abnormal gait;Decreased coordination;Pain;Difficulty walking;Decreased balance;Decreased strength;Decreased range of motion;Decreased activity  tolerance;Decreased endurance   Rehab Potential Good   PT Frequency 3x / week   PT Duration 3 weeks   PT Treatment/Interventions ADLs/Self Care Home Management;Therapeutic activities;Therapeutic exercise;Gait training;Balance training;Stair training;Neuromuscular re-education;Energy conservation;Functional mobility training   PT Next Visit Plan ;increase weight on leg press to 95#     Consulted and Agree with Plan of Care Patient   Family Member Consulted spouse                               Problem List Patient Active Problem List   Diagnosis Date Noted  . Neurofibromatosis, type 1 12/14/2013  . Preventative health care 06/23/2010  . [facility] 04/12/2009    Larry KosDilday, Larry Riley 12/25/2013, 4:46 PM     Kerry FortSuzanne Nasri Riley, PT [facility] [address] [place], [place] [address] [phone]

## 2013-12-25 NOTE — Therapy (Signed)
[facility] [address] [place], [place], [address] Phone: [phone]   Fax:  [phone]  Occupational Therapy Treatment  Patient Details  Name: Larry HammondJoshua Riley MRN: [id number] Date of Birth: [birth year]  Encounter Date: 12/25/2013      OT End of Session - 12/25/13 1703    Visit Number 7   Number of Visits 16   Date for OT Re-Evaluation 01/25/14   OT Start Time 1317   OT Stop Time 1400   OT Time Calculation (min) 43 min   Activity Tolerance Patient tolerated treatment well      Past Medical History  Diagnosis Date  . Neurofibromatosis     tumor down spine and brain  . MRSA (methicillin resistant Staphylococcus aureus) 2005    leg    Past Surgical History  Procedure Laterality Date  . Neck surgery       tumor removal  . Hydrocephalus      [place]  . Spine surgery  10/2013    c2-t2 fusion  , laminectomy c1-6-- Dr [facility]-- [place]  . Vasectomy  02/2013    There were no vitals taken for this visit.  Visit Diagnosis:  Decreased strength  Lack of coordination  Pain in joint, shoulder region, right      Subjective Assessment - 12/25/13 1320    Symptoms (p) "I am doing pretty well"   Pertinent History (p) see epic snapshot   Currently in Pain? (p) Yes  pt states pain only in neck the past few days   Pain Score (p) 4    Pain Location (p) Neck   Pain Orientation (p) Posterior   Pain Descriptors / Indicators (p) Aching   Pain Type (p) Surgical pain            OT Treatments/Exercises (OP) - 12/25/13 0001    Fine Motor Coordination   Fine Motor Coordination In hand manipuation training   In Hand Manipulation Training Key peg orientation board, dexterity test (without tweezers at this time).  Screwing and unscrewing small screws. Pt did all activities with both hands.            OT Short Term Goals - 12/25/13 1707    OT SHORT TERM GOAL #1   Title Pt will be I with HEP - 12/27/2013   Time 4    Period Weeks   Status Achieved   OT SHORT TERM GOAL #2   Title Pt will demonstrate improved coordination as evidenced by decreasing 9  hole peg test combined by 10 seconds to aide in functional tasks   Time 4   Period Weeks   Status Achieved   OT SHORT TERM GOAL #3   Title Pateint will be mod I with butttoning    Time 4   Period Weeks   Status Achieved   OT SHORT TERM GOAL #4   Title Pt will be mod I with tying shoes with AE prn   Time 4   Period Weeks   Status Achieved          OT Long Term Goals - 12/25/13 1707    OT LONG TERM GOAL #1   Title Pt will be I with upgraded HEP prn - 01/25/2014   Time 8   Period Weeks   Status On-going   OT LONG TERM GOAL #2   Title Pt will demonstrate improved coordination by decreasing 9 hole peg test combined by 20 seconds to assist wtih functional activity   Time 8  Period Weeks   Status On-going   OT LONG TERM GOAL #3   Title Patient will be I with laundry   Time 8   Period Weeks   Status Achieved   OT LONG TERM GOAL #4   Title Patient will be able to type with both hands within reasonable period of time in preparation to returning to work.   Time 8   Period Weeks   Status On-going   OT LONG TERM GOAL #5   Title Patient will be I with  meal prep at ambulatory level   Time 8   Period Weeks   Status On-going          Plan - 12/25/13 1704    Clinical Impression Statement Pt with improving fine motor coordination.  Pt reports he is also now pain free at R shoulder and neck pain is at surgical site and also reduced.  Pt is also participating in  more IADL activities at home (laundry, some meal prep, making the bed).   Rehab Potential Good   Clinical Impairments Affecting Rehab Potential Pt will beneft form skilled OT to address the following:  decreased bilateral coordination, decreased funcitonal use of BUE's, decreased strength BUE's   OT Frequency 2x / week   OT Duration 8 weeks   OT Treatment/Interventions Self-care/ADL  training;Moist Heat;Neuromuscular education;Therapeutic exercise;DME and/or AE instruction;Functional Mobility Training;Manual Therapy;Therapeutic activities;Patient/family education;Balance training;Therapeutic exercises;Parrafin;Cryotherapy;Ultrasound;Fluidtherapy   Plan teach gentle stretch program to ensure pain free shoulder, gentle strengthening for UE as pt can tolerate                               Problem List Patient Active Problem List   Diagnosis Date Noted  . Neurofibromatosis, type 1 12/14/2013  . Preventative health care 06/23/2010  . HORNER'S SYNDROME 04/12/2009   Mackie PaiKaren Pulaski, MS, OTR/L 12/25/2013 5:09 PM Phone: [phone] Fax: [phone]   Norton Pastelulaski, Karen Halliday 12/25/2013, 5:09 PM

## 2013-12-26 ENCOUNTER — Ambulatory Visit: Payer: 59 | Admitting: Physical Therapy

## 2013-12-26 ENCOUNTER — Encounter: Payer: Self-pay | Admitting: Physical Therapy

## 2013-12-26 DIAGNOSIS — R531 Weakness: Secondary | ICD-10-CM

## 2013-12-26 DIAGNOSIS — R279 Unspecified lack of coordination: Secondary | ICD-10-CM

## 2013-12-26 DIAGNOSIS — Z9889 Other specified postprocedural states: Secondary | ICD-10-CM | POA: Diagnosis not present

## 2013-12-26 DIAGNOSIS — R269 Unspecified abnormalities of gait and mobility: Secondary | ICD-10-CM

## 2013-12-26 DIAGNOSIS — R278 Other lack of coordination: Secondary | ICD-10-CM

## 2013-12-26 NOTE — Therapy (Signed)
[facility] [address] [place], [place], [address] Phone: [phone]   Fax:  [phone]  Physical Therapy Treatment  Patient Details  Name: Larry HammondJoshua Konecny MRN: [id number] Date of Birth: 01-31-1980  Encounter Date: 12/26/2013      PT End of Session - 12/26/13 2157    Visit Number 14   Number of Visits 20   Date for PT Re-Evaluation 01/14/14   Authorization Type U[employer]- visit limit 20 for PT, 20 for OT.   Authorization - Number of Visits 20   PT Start Time 1146   PT Stop Time 1235   PT Time Calculation (min) 49 min   Equipment Utilized During Treatment Gait belt   Activity Tolerance Patient tolerated treatment well      Past Medical History  Diagnosis Date  . Neurofibromatosis     tumor down spine and brain  . MRSA (methicillin resistant Staphylococcus aureus) 2005    leg    Past Surgical History  Procedure Laterality Date  . Neck surgery       tumor removal  . Hydrocephalus      [place]  . Spine surgery  10/2013    c2-t2 fusion  , laminectomy c1-6-- Dr [facility]d [facility]-- [place]  . Vasectomy  02/2013    There were no vitals taken for this visit.  Visit Diagnosis:  Decreased strength  Abnormality of gait  Decreased coordination      Subjective Assessment - 12/26/13 2145    Symptoms pt. states a signed script is needed for AFO to be obtained; no new c/o's reported   Patient Stated Goals Increase LLE strength and improve gait and balance   Currently in Pain? Yes   Pain Score 4    Pain Location Shoulder   Pain Orientation Right   Pain Descriptors / Indicators Throbbing;Aching;Tightness   Pain Type Surgical pain   Pain Onset More than a month ago   Multiple Pain Sites No            [facility] Adult PT Treatment/Exercise - 12/26/13 1205    Ambulation/Gait   Ambulation/Gait Yes   Ambulation/Gait Assistance 5: Supervision   Ambulation Distance (Feet) 120 Feet   Assistive device None  pt using RW for  community ambulation   Gait Pattern Decreased dorsiflexion - left   High Level Balance   High Level Balance Activities Side stepping  crossovers front only with UE support prn   High Level Balance Comments standing on BOSU; weight shifts anterior and posteriorly; moving RLE up/back and out to side for L single limb stance   Exercises   Exercises Lumbar;Knee/Hip;Ankle;Balance   Lumbar Exercises: Aerobic   Elliptical 1" forward; 1" backward level 1.6   Lumbar Exercises: Machines for Strengthening   Leg Press 100# bil. Le'sx 20 reps; LLE only 70# 20 reps   Knee/Hip Exercises: Standing   Forward Step Up 10 reps  LLE   Step Down 10 reps  LLE   Functional Squat 10 reps  on BOSU with no UE support   Other Standing Knee Exercises standing L knee extension control exercise with blue theraband x 10 reps each position of RLE - bilateral stance, forward stance and backward stance   Knee/Hip Exercises: Supine   Knee Flexion 20 reps  seated with blue theraband   Ankle Exercises: Standing   Heel Raises 10 reps   Heel Raises Limitations weakness in left plantarflexors   Other Standing Ankle Exercises heel raises LLE of hi/lo table in closed chain  x 10reps              PT Long Term Goals - 12/26/13 2159    PT LONG TERM GOAL #1   Title demonstrate and /or verbalized techniques to redue the risk of re-inury to inclued info on : physical activity (01/12/14)   Baseline 01-17-14   Time 3   Period Weeks   Status On-going   PT LONG TERM GOAL #2   Title be independent with advanced HEP for strengthening (01/12/14)   Baseline 01-17-14   Time 3   Period Weeks   Status On-going   PT LONG TERM GOAL #3   Title timed up and go score <15 seconds (01/12/14)   Baseline 01-17-14   Time 3   Period Weeks   Status On-going          Plan - 12/26/13 2157    Clinical Impression Statement pt. increasing strength in LLE but cont. to demo decr. muscle endurance as evidenced by foot slap LLE with fatigue   Pt  will benefit from skilled therapeutic intervention in order to improve on the following deficits Abnormal gait;Decreased coordination;Pain;Difficulty walking;Decreased balance;Decreased strength;Decreased range of motion;Decreased activity tolerance;Decreased endurance   PT Frequency 3x / week   PT Duration 3 weeks   PT Treatment/Interventions ADLs/Self Care Home Management;Therapeutic activities;Therapeutic exercise;Gait training;Balance training;Stair training;Neuromuscular re-education;Energy conservation;Functional mobility training   PT Next Visit Plan continue LLE strengthening and balance retraining   Recommended Other Services script faxed to Dr. Laury AxonLowne for AFO   Consulted and Agree with Plan of Care Patient   Family Member Consulted spouse                               Problem List Patient Active Problem List   Diagnosis Date Noted  . Neurofibromatosis, type 1 12/14/2013  . Preventative health care 06/23/2010  . HORNER'S SYNDROME 04/12/2009    Larry Riley, Larry Riley Larry 12/26/2013, 10:02 PM     Kerry FortSuzanne Rane Riley, PT [facility] [address] [place], [place] [address] [phone]

## 2013-12-28 ENCOUNTER — Encounter: Payer: Self-pay | Admitting: Physical Therapy

## 2013-12-28 ENCOUNTER — Encounter: Payer: Self-pay | Admitting: Occupational Therapy

## 2013-12-28 ENCOUNTER — Ambulatory Visit: Payer: 59 | Admitting: Physical Therapy

## 2013-12-28 ENCOUNTER — Ambulatory Visit: Payer: 59 | Admitting: Occupational Therapy

## 2013-12-28 ENCOUNTER — Other Ambulatory Visit: Payer: Self-pay | Admitting: Family Medicine

## 2013-12-28 DIAGNOSIS — R531 Weakness: Secondary | ICD-10-CM

## 2013-12-28 DIAGNOSIS — R279 Unspecified lack of coordination: Secondary | ICD-10-CM

## 2013-12-28 DIAGNOSIS — R278 Other lack of coordination: Secondary | ICD-10-CM

## 2013-12-28 DIAGNOSIS — Z9889 Other specified postprocedural states: Secondary | ICD-10-CM | POA: Diagnosis not present

## 2013-12-28 DIAGNOSIS — Q8500 Neurofibromatosis, unspecified: Secondary | ICD-10-CM

## 2013-12-28 DIAGNOSIS — R269 Unspecified abnormalities of gait and mobility: Secondary | ICD-10-CM

## 2013-12-28 DIAGNOSIS — M25511 Pain in right shoulder: Secondary | ICD-10-CM

## 2013-12-28 NOTE — Therapy (Signed)
[facility] [address] [place], [place], [address] Phone: [phone]   Fax:  [phone]  Physical Therapy Treatment  Patient Details  Name: Larry Riley MRN: [id number] Date of Birth: 11/02/1980  Encounter Date: 12/28/2013      PT End of Session - 12/28/13 1606    Visit Number 15   Number of Visits 20   Date for PT Re-Evaluation 01/14/14   Authorization Type UHC- visit limit 20 for PT, 20 for OT.   Authorization - Number of Visits 20   PT Start Time 0809   PT Stop Time 0845   PT Time Calculation (min) 36 min   Equipment Utilized During Treatment Gait belt   Activity Tolerance Patient tolerated treatment well   Behavior During Therapy WFL for tasks assessed/performed      Past Medical History  Diagnosis Date  . Neurofibromatosis     tumor down spine and brain  . MRSA (methicillin resistant Staphylococcus aureus) 2005    leg    Past Surgical History  Procedure Laterality Date  . Neck surgery       tumor removal  . Hydrocephalus      [place]  . Spine surgery  10/2013    c2-t2 fusion  , laminectomy c1-6-- Dr [facility]d [facility]emp[facility]-- [place]  . Vasectomy  02/2013    There were no vitals taken for this visit.  Visit Diagnosis:  Decreased strength  Abnormality of gait  Decreased coordination  Lack of coordination      Subjective Assessment - 12/28/13 0812    Symptoms No new complaints. No falls to report.   Currently in Pain? Yes   Pain Score 4    Pain Location Neck   Pain Orientation Right   Pain Descriptors / Indicators Sore;Tightness;Aching   Pain Type Surgical pain   Pain Radiating Towards down into right shoulder   Pain Onset More than a month ago   Aggravating Factors  increased use of right UE   Pain Relieving Factors ice, pain medicine            [facility] Adult PT Treatment/Exercise - 12/28/13 0821    Knee/Hip Exercises: Aerobic   Elliptical 1 minute each way Level 1.3   Knee/Hip Exercises:  Machines for Strengthening   Cybex Leg Press 100# bil legs 5 sec hold x10 reps   Knee/Hip Exercises: Standing   Forward Lunges Both;1 set;10 reps  alternating legs   Forward Lunges Limitations from floor to BOSU with intermittent UE assist   Knee/Hip Exercises: Seated   Long Arc Quad Strengthening;Both;1 set;10 reps  alternating legs with 5 second hold   Long Arc Quad Limitations resisted with blue theraband, cues on posture and to decrease posterior trunk lean/compensation                     Neuro Re-ed: in parallel bars - Inverted BOSU- rocks forward/backward, rocks left/right, and squats x10 each without UE support.  - Feet across red foam beam: alternating forward heel taps x 10 reps each and alternating backward toe taps x 10 reps each.  - On balance board: eyes closed static hold 20 sec's x 3 reps both ways on board without UE assist        PT Long Term Goals - 12/28/13 1621    PT LONG TERM GOAL #1   Title demonstrate and /or verbalized techniques to redue the risk of re-inury to inclued info on : physical activity (01/12/14)   Baseline 01-17-14  Time 3   Period Weeks   Status On-going   PT LONG TERM GOAL #2   Title be independent with advanced HEP for strengthening (01/12/14)   Baseline 01-17-14   Time 3   Period Weeks   Status On-going   PT LONG TERM GOAL #3   Title timed up and go score <15 seconds (01/12/14)   Baseline 01-17-14   Time 3   Period Weeks   Status On-going          Plan - 12/28/13 1607    Clinical Impression Statement Pt continues to make progress toward goals with improved gait and balance.    Pt will benefit from skilled therapeutic intervention in order to improve on the following deficits Abnormal gait;Decreased coordination;Pain;Difficulty walking;Decreased balance;Decreased strength;Decreased range of motion;Decreased activity tolerance;Decreased endurance   PT Frequency 3x / week   PT Duration 3 weeks   PT Treatment/Interventions ADLs/Self Care  Home Management;Therapeutic activities;Therapeutic exercise;Gait training;Balance training;Stair training;Neuromuscular re-education;Energy conservation;Functional mobility training   PT Next Visit Plan continue LLE strengthening and balance retraining   Consulted and Agree with Plan of Care Patient   Family Member Consulted spouse     Problem List Patient Active Problem List   Diagnosis Date Noted  . Neurofibromatosis, type 1 12/14/2013  . Preventative health care 06/23/2010  . HORNER'S SYNDROME 04/12/2009    Sallyanne KusterBury, Kathy 12/28/2013, 4:25 PM  Sallyanne KusterKathy Bury, PTA, [facility] [address], [address] [place], [place] [address] [phone] 12/28/2013, 4:25 PM

## 2013-12-28 NOTE — Patient Instructions (Signed)
Reviewed status of short term and long term goals with patient and wife

## 2013-12-28 NOTE — Therapy (Signed)
[facility] [address] [place], [place], [address] Phone: [phone]   Fax:  [phone]  Occupational Therapy Treatment  Patient Details  Name: Larry Riley MRN: [id number] Date of Birth: 1980-09-03  Encounter Date: 12/28/2013      OT End of Session - 12/28/13 0933    Visit Number 8   Number of Visits 16   Date for OT Re-Evaluation 01/25/14   OT Start Time 0845   OT Stop Time 0929   OT Time Calculation (min) 44 min   Activity Tolerance Patient tolerated treatment well      Past Medical History  Diagnosis Date  . Neurofibromatosis     tumor down spine and brain  . MRSA (methicillin resistant Staphylococcus aureus) 2005    leg    Past Surgical History  Procedure Laterality Date  . Neck surgery       tumor removal  . Hydrocephalus      [place]  . Spine surgery  10/2013    c2-t2 fusion  , laminectomy c1-6-- Dr Raynald [facility]emp[facility]-- [place]  . Vasectomy  02/2013    There were no vitals taken for this visit.  Visit Diagnosis:  Decreased strength  Decreased coordination  Pain in joint, shoulder region, right      Subjective Assessment - 12/28/13 0849    Symptoms "I am doing well today"   Pertinent History see epic snapshot   Pain Score 4    Pain Location Neck   Pain Orientation Posterior   Pain Descriptors / Indicators Aching   Pain Type Chronic pain   Pain Onset More than a month ago   Pain Frequency Other (Comment)  activity dependent   Aggravating Factors  increased activity, guarding with shoulders   Pain Relieving Factors ice, heat, pain meds, therapy            OT Treatments/Exercises (OP) - 12/28/13 [id number]    ADLs   Work typing activity to address coordination, sustained hand activity, posture. Pt typed 165 words in 10.21.31 minutes with 19 errors.     Fine Motor Coordination   Fine Motor Coordination In hand manipuation training   In Hand Manipulation Training picking up very small objects of  various shapes and sizes; wing screw with three step action.  Pt completed all activities with both hands due to deficits in both UE's.          OT Education - 12/28/13 [id number]    Education provided Yes   Education Details status of goals   Person(s) Educated Patient;Spouse   Methods Explanation   Comprehension Verbalized understanding          OT Short Term Goals - 12/28/13 0935    OT SHORT TERM GOAL #1   Title Pt will be I with HEP - 12/27/2013   Time 4   Period Weeks   Status Achieved   OT SHORT TERM GOAL #2   Title Pt will demonstrate improved coordination as evidenced by decreasing 9  hole peg test combined by 10 seconds to aide in functional tasks   Time 4   Period Weeks   Status Achieved   OT SHORT TERM GOAL #3   Title Pateint will be mod I with butttoning    Time 4   Period Weeks   Status Achieved   OT SHORT TERM GOAL #4   Title Pt will be mod I with tying shoes with AE prn   Time 4   Period Weeks   Status  Achieved          OT Long Term Goals - 12/28/13 0935    OT LONG TERM GOAL #1   Title Pt will be I with upgraded HEP prn - 01/25/2014   Time 8   Period Weeks   Status On-going   OT LONG TERM GOAL #2   Title Pt will demonstrate improved coordination by decreasing 9 hole peg test combined by 20 seconds to assist wtih functional activity   Time 8   Period Weeks   Status On-going   OT LONG TERM GOAL #3   Title Patient will be I with laundry   Time 8   Period Weeks   Status Achieved   OT LONG TERM GOAL #4   Title Patient will be able to type with both hands within reasonable period of time in preparation to returning to work.   Time 8   Period Weeks   Status On-going   OT LONG TERM GOAL #5   Title Patient will be I with  meal prep at ambulatory level   Time 8   Period Weeks   Status On-going          Plan - 12/28/13 0933    Clinical Impression Statement Pt making excellent progress toward LTG's.  Pt given home work assignment for this weekend  to prepare meal independently.     Rehab Potential Good   Clinical Impairments Affecting Rehab Potential Pt will beneft form skilled OT to address the following:  decreased bilateral coordination, decreased funcitonal use of BUE's, decreased strength BUE's   OT Frequency 2x / week   OT Duration 8 weeks   OT Treatment/Interventions Self-care/ADL training;Moist Heat;Neuromuscular education;Therapeutic exercise;DME and/or AE instruction;Functional Mobility Training;Manual Therapy;Therapeutic activities;Patient/family education;Balance training;Therapeutic exercises;Parrafin;Cryotherapy;Ultrasound;Fluidtherapy   Plan continue FM activites as well typing activities for return to work.  Contact MD to clarifly what resistive strenthening can be done with UE's                               Problem List Patient Active Problem List   Diagnosis Date Noted  . Neurofibromatosis, type 1 12/14/2013  . Preventative health care 06/23/2010  . HORNER'S SYNDROME 04/12/2009   Mackie PaiKaren Verginia Toohey, MS, OTR/L 12/28/2013 9:38 AM Phone: [phone] Fax: [phone]   Norton Pastelulaski, Jonique Kulig Halliday 12/28/2013, 9:37 AM

## 2014-01-01 ENCOUNTER — Encounter: Payer: Self-pay | Admitting: Occupational Therapy

## 2014-01-01 ENCOUNTER — Encounter: Payer: Self-pay | Admitting: Physical Therapy

## 2014-01-01 ENCOUNTER — Ambulatory Visit: Payer: 59 | Admitting: Physical Therapy

## 2014-01-01 ENCOUNTER — Ambulatory Visit: Payer: 59 | Admitting: Occupational Therapy

## 2014-01-01 DIAGNOSIS — M25511 Pain in right shoulder: Secondary | ICD-10-CM

## 2014-01-01 DIAGNOSIS — R269 Unspecified abnormalities of gait and mobility: Secondary | ICD-10-CM

## 2014-01-01 DIAGNOSIS — R531 Weakness: Secondary | ICD-10-CM

## 2014-01-01 DIAGNOSIS — Z9889 Other specified postprocedural states: Secondary | ICD-10-CM | POA: Diagnosis not present

## 2014-01-01 DIAGNOSIS — R278 Other lack of coordination: Secondary | ICD-10-CM

## 2014-01-01 DIAGNOSIS — R279 Unspecified lack of coordination: Secondary | ICD-10-CM

## 2014-01-01 NOTE — Therapy (Signed)
[facility] [facility] [address] [place], [place], [address] Phone: [phone]   Fax:  [phone]  Physical Therapy Treatment  Patient Details  Name: Larry Riley MRN: [id number] Date of Birth: [birth year]  Encounter Date: 01/01/2014      PT End of Session - 01/01/14 2031    Visit Number 16   Number of Visits 20   Date for PT Re-Evaluation 01/14/14   Authorization Type UHC- visit limit 20 for PT, 20 for OT.   Authorization - Number of Visits 20      Past Medical History  Diagnosis Date  . Neurofibromatosis     tumor down spine and brain  . MRSA (methicillin resistant Staphylococcus aureus) 2005    leg    Past Surgical History  Procedure Laterality Date  . Neck surgery       tumor removal  . Hydrocephalus      [place]  . Spine surgery  10/2013    c2-t2 fusion  , laminectomy c1-6-- Dr Raynald KempHSU-- [place]  . Vasectomy  02/2013    There were no vitals taken for this visit.  Visit Diagnosis:  Abnormality of gait  Decreased strength      Subjective Assessment - 01/01/14 2025    Symptoms Pt. reports going to company Christmas party Friday night - took and used his RW but did well with mobility   Patient Stated Goals Increase LLE strength and improve gait and balance                    [facility] Adult PT Treatment/Exercise - 01/01/14 1325    Dynamic Standing Balance   Dynamic Standing - Balance Activities Rocker board;Step ups;Alternating  foot traps  6" step used with CGA   Lumbar Exercises: Aerobic   Elliptical 1" forward; 1" backward level 1.6   Lumbar Exercises: Machines for Strengthening   Leg Press 105# bil. LE's 20 reps; LLE only 70# 20 reps   Knee/Hip Exercises: Seated   Other Seated Knee Exercises left knee flexion with red theraband x 20 reps   Ankle Exercises: Standing   Rocker Board 1 minute   Other Standing Ankle Exercises heel raises LLE of hi/lo table in closed chain x 10reps    Other Standing Ankle Exercises heel raises in standing - bil. LE's 10 reps      Neuro Re-ed; Standing on Bosu - weight shifts with UE support prn: moving RLE up/back and laterally for L single limb stance with CGA            PT Short Term Goals - 12/18/13 2107    PT SHORT TERM GOAL #2   Title timed up and go score <=23 seconds (12/15/13)   Baseline score 11 secs with no device   Status Achieved           PT Long Term Goals - 12/28/13 1621    PT LONG TERM GOAL #1   Title demonstrate and /or verbalized techniques to redue the risk of re-inury to inclued info on : physical activity (01/12/14)   Baseline 01-17-14   Time 3   Period Weeks   Status On-going   PT LONG TERM GOAL #2   Title be independent with advanced HEP for strengthening (01/12/14)   Baseline 01-17-14   Time 3   Period Weeks   Status On-going   PT LONG TERM GOAL #3   Title timed up and go score <15 seconds (01/12/14)   Baseline 01-17-14  Time 3   Period Weeks   Status On-going               Plan - 01/01/14 2031    Clinical Impression Statement Pt. continues to progress well but demo decr. muscle endurance in left ankle, primarily dorsiflexors; script received today for AFO from Dr. Laury AxonLowne   Pt will benefit from skilled therapeutic intervention in order to improve on the following deficits Abnormal gait;Decreased coordination;Pain;Difficulty walking;Decreased balance;Decreased strength;Decreased range of motion;Decreased activity tolerance;Decreased endurance   Rehab Potential Good   PT Frequency 3x / week   PT Duration 2 weeks   PT Treatment/Interventions ADLs/Self Care Home Management;Therapeutic activities;Therapeutic exercise;Gait training;Balance training;Stair training;Neuromuscular re-education;Energy conservation;Functional mobility training   PT Next Visit Plan continue LLE strengthening and balance retraining   Consulted and Agree with Plan of Care Patient   Family Member Consulted spouse         Problem List Patient Active Problem List   Diagnosis Date Noted  . Neurofibromatosis, type 1 12/14/2013  . Preventative health care 06/23/2010  . [facility]    Kary KosDilday, Trishna Cwik Suzanne, PT 01/01/2014, 8:35 PM  [place] [facility] [address] [place], [place], [address] Phone: [phone]   Fax:  [phone]

## 2014-01-01 NOTE — Therapy (Signed)
[facility] [address] [place], [place], [address] Phone: [phone]   Fax:  [phone]  Occupational Therapy Treatment  Patient Details  Name: Larry HammondJoshua Riley MRN: [id number] Date of Birth: [birth year]  Encounter Date: 01/01/2014      OT End of Session - 01/01/14 1449    Visit Number 9   Number of Visits 16   Date for OT Re-Evaluation 01/25/14   OT Start Time 1401   OT Stop Time 1443   OT Time Calculation (min) 42 min   Activity Tolerance Patient tolerated treatment well      Past Medical History  Diagnosis Date  . Neurofibromatosis     tumor down spine and brain  . MRSA (methicillin resistant Staphylococcus aureus) 2005    leg    Past Surgical History  Procedure Laterality Date  . Neck surgery       tumor removal  . Hydrocephalus      [place] 1999[place]  . Spine surgery  10/2013    c2-t2 fusion  , laminectomy c1-6-- Dr Raynald KempHSU-- [place]  . Vasectomy  02/2013    There were no vitals taken for this visit.  Visit Diagnosis:  Decreased strength  Decreased coordination  Pain in joint, shoulder region, right      Subjective Assessment - 01/01/14 1406    Symptoms "I slept on my left shoulder wrong and have a big knot in it today"   Pertinent History see epic snapshot   Pain Score 6    Pain Location Shoulder   Pain Orientation Left   Pain Descriptors / Indicators Throbbing   Pain Type Acute pain   Pain Onset Today  pt feels he slept on it wrong   Pain Frequency Rarely                 OT Treatments/Exercises (OP) - 01/01/14 0001    Fine Motor Coordination   Fine Motor Coordination In hand manipuation training   In Hand Manipulation Training completed tweezer dexterity activity with R (dominant hand) while simultaneously doing moist heat to L shoulder (see pain section)   Modalities   Modalities Moist Heat   Moist Heat Therapy   Number Minutes Moist Heat 10 Minutes   Moist Heat  Location Shoulder  while simultaneously doing fine motor activity   Manual Therapy   Manual Therapy Myofascial release;Scapular mobilization   Myofascial Release Pt with very tight band and multiple trigger points along upper trapezius causing pain and limiting AROM in LUE. Pt tolerated well with good relief - pt initially reported pain of 6/10 then reported 3/10 at end of session with improved ROM.                   OT Short Term Goals - 01/01/14 1451    OT SHORT TERM GOAL #1   Title Pt will be I with HEP - 12/27/2013   Time 4   Period Weeks   Status Achieved   OT SHORT TERM GOAL #2   Title Pt will demonstrate improved coordination as evidenced by decreasing 9  hole peg test combined by 10 seconds to aide in functional tasks   Time 4   Period Weeks   Status Achieved   OT SHORT TERM GOAL #3   Title Pateint will be mod I with butttoning    Time 4   Period Weeks   Status Achieved   OT SHORT TERM GOAL #4   Title Pt will be mod  I with tying shoes with AE prn   Time 4   Period Weeks   Status Achieved           OT Long Term Goals - 01/01/14 1451    OT LONG TERM GOAL #1   Title Pt will be I with upgraded HEP prn - 01/25/2014   Time 8   Period Weeks   Status On-going   OT LONG TERM GOAL #2   Title Pt will demonstrate improved coordination by decreasing 9 hole peg test combined by 20 seconds to assist wtih functional activity   Time 8   Period Weeks   Status On-going   OT LONG TERM GOAL #3   Title Patient will be I with laundry   Time 8   Period Weeks   Status Achieved   OT LONG TERM GOAL #4   Title Patient will be able to type with both hands within reasonable period of time in preparation to returning to work.   Time 8   Period Weeks   Status On-going   OT LONG TERM GOAL #5   Title Patient will be I with  meal prep at ambulatory level   Time 8   Period Weeks   Status On-going               Plan - 01/01/14 1449    Clinical Impression Statement  Pt continues to make good progress toward LTG's.   Rehab Potential Good   Clinical Impairments Affecting Rehab Potential Pt will beneft form skilled OT to address the following:  decreased bilateral coordination, decreased funcitonal use of BUE's, decreased strength BUE's   OT Frequency 2x / week   OT Duration 8 weeks   OT Treatment/Interventions Self-care/ADL training;Moist Heat;Neuromuscular education;Therapeutic exercise;DME and/or AE instruction;Functional Mobility Training;Manual Therapy;Therapeutic activities;Patient/family education;Balance training;Therapeutic exercises;Parrafin;Cryotherapy;Ultrasound;Fluidtherapy   Plan Awaiting return call from MD-will contact again today.  Address any pain issues next session;  strengthen UE if MD allows        Problem List Patient Active Problem List   Diagnosis Date Noted  . Neurofibromatosis, type 1 12/14/2013  . Preventative health care 06/23/2010  . [facility]    Norton Pastelulaski, Jerita Wimbush Halliday 01/01/2014, 2:53 PM  [place] [facility] [address] [place], [place], [address] Phone: [phone]   Fax:  [phone]

## 2014-01-02 ENCOUNTER — Ambulatory Visit: Payer: 59 | Admitting: Physical Therapy

## 2014-01-02 ENCOUNTER — Encounter: Payer: Self-pay | Admitting: Occupational Therapy

## 2014-01-02 ENCOUNTER — Encounter: Payer: Self-pay | Admitting: Physical Therapy

## 2014-01-02 ENCOUNTER — Ambulatory Visit: Payer: 59 | Admitting: Occupational Therapy

## 2014-01-02 DIAGNOSIS — Z9889 Other specified postprocedural states: Secondary | ICD-10-CM | POA: Diagnosis not present

## 2014-01-02 DIAGNOSIS — R279 Unspecified lack of coordination: Secondary | ICD-10-CM

## 2014-01-02 DIAGNOSIS — R531 Weakness: Secondary | ICD-10-CM

## 2014-01-02 DIAGNOSIS — M25511 Pain in right shoulder: Secondary | ICD-10-CM

## 2014-01-02 DIAGNOSIS — R278 Other lack of coordination: Secondary | ICD-10-CM

## 2014-01-02 DIAGNOSIS — R269 Unspecified abnormalities of gait and mobility: Secondary | ICD-10-CM

## 2014-01-02 NOTE — Therapy (Signed)
[facility] [address] [place], [place], [address] Phone: [phone]   Fax:  [phone]  Physical Therapy Treatment  Patient Details  Name: Larry HammondJoshua Fontan MRN: [id number] Date of Birth: 01-20-1980  Encounter Date: 01/02/2014      PT End of Session - 01/02/14 2120    Visit Number 17   Number of Visits 20   Date for PT Re-Evaluation 01/14/14   Authorization Type UHC- visit limit 20 for PT, 20 for OT.   Authorization - Number of Visits 20   PT Start Time 1406   PT Stop Time 1450   PT Time Calculation (min) 44 min      Past Medical History  Diagnosis Date  . Neurofibromatosis     tumor down spine and brain  . MRSA (methicillin resistant Staphylococcus aureus) 2005    leg    Past Surgical History  Procedure Laterality Date  . Neck surgery       tumor removal  . Hydrocephalus      [place]  . Spine surgery  10/2013    c2-t2 fusion  , laminectomy c1-6-- Dr Raynald Kemp[facility]SU-- [place]  . Vasectomy  02/2013    There were no vitals taken for this visit.  Visit Diagnosis:  Abnormality of gait  Decreased strength      Subjective Assessment - 01/02/14 2113    Symptoms Pt. reports being tired today - didn't sleep well last night   Pertinent History von Reckinhausens disease;    Patient Stated Goals Increase LLE strength and improve gait and balance   Currently in Pain? Yes   Pain Score 4    Pain Location Shoulder   Pain Orientation Left   Pain Descriptors / Indicators Aching;Sore   Pain Type Chronic pain   Multiple Pain Sites No                    [facility] Adult PT Treatment/Exercise - 01/02/14 1415    High Level Balance   High Level Balance Comments standing on BOSU; weight shifts anterior and posteriorly; moving RLE up/back and out to side for L single limb stance   Lumbar Exercises: Machines for Strengthening   Leg Press 110# bil. LE's: LLE only 70# 20 reps   Knee/Hip Exercises: Standing    Heel Raises 10 reps;1 set  off hi/lo mat table 2 sets of 10 reps LLE   Forward Step Up 10 reps;Step Height: 6"  LLE with cues to prevent hyperextension   Step Down 10 reps;Step Height: 6"  for L quad eccentric strengthening   Functional Squat 10 reps  on BOSU   Knee/Hip Exercises: Seated   Other Seated Knee Exercises seated L hamstring with red theraband 10 reps with 3 sec hold     There Ex:  standing left hip abduction and extension with red theraband x 10 reps with bil. UE support             PT Short Term Goals - 12/18/13 2107    PT SHORT TERM GOAL #2   Title timed up and go score <=23 seconds (12/15/13)   Baseline score 11 secs with no device   Status Achieved           PT Long Term Goals - 12/28/13 1621    PT LONG TERM GOAL #1   Title demonstrate and /or verbalized techniques to redue the risk of re-inury to inclued info on : physical activity (01/12/14)   Baseline 01-17-14  Time 3   Period Weeks   Status On-going   PT LONG TERM GOAL #2   Title be independent with advanced HEP for strengthening (01/12/14)   Baseline 01-17-14   Time 3   Period Weeks   Status On-going   PT LONG TERM GOAL #3   Title timed up and go score <15 seconds (01/12/14)   Baseline 01-17-14   Time 3   Period Weeks   Status On-going               Plan - 01/02/14 2122    Clinical Impression Statement pt. progressing well but LLE exhibited more fatigue and decr. muscle endurance today than yesterday   Pt will benefit from skilled therapeutic intervention in order to improve on the following deficits Abnormal gait;Decreased coordination;Pain;Difficulty walking;Decreased balance;Decreased strength;Decreased range of motion;Decreased activity tolerance;Decreased endurance   Rehab Potential Good   PT Frequency 3x / week   PT Duration 2 weeks   PT Treatment/Interventions ADLs/Self Care Home Management;Therapeutic activities;Therapeutic exercise;Gait training;Balance training;Stair  training;Neuromuscular re-education;Energy conservation;Functional mobility training   PT Next Visit Plan continue LLE strengthening and balance retraining   Consulted and Agree with Plan of Care Patient   Family Member Consulted spouse        Problem List Patient Active Problem List   Diagnosis Date Noted  . Neurofibromatosis, type 1 12/14/2013  . Preventative health care 06/23/2010  . HORNER'S SYNDROME 04/12/2009    Kary Kosilday, Jaylon Boylen Suzanne, PT 01/02/2014, 9:25 PM  [place] [facility] [address] [place], [place], [address] Phone: [phone]   Fax:  [phone]

## 2014-01-02 NOTE — Therapy (Signed)
[facility] [address] [place], [place], [address] Phone: [phone]   Fax:  [phone]  Occupational Therapy Treatment  Patient Details  Name: Larry HammondJoshua Riley MRN: [id number] Date of Birth: [birth year]  Encounter Date: 01/02/2014      OT End of Session - 01/02/14 1706    Visit Number 10   Number of Visits 16   Date for OT Re-Evaluation 01/25/14   OT Start Time 1315   OT Stop Time 1358   OT Time Calculation (min) 43 min   Activity Tolerance Patient tolerated treatment well      Past Medical History  Diagnosis Date  . Neurofibromatosis     tumor down spine and brain  . MRSA (methicillin resistant Staphylococcus aureus) 2005    leg    Past Surgical History  Procedure Laterality Date  . Neck surgery       tumor removal  . Hydrocephalus      [place]  . Spine surgery  10/2013    c2-t2 fusion  , laminectomy c1-6-- Dr Raynald KempHSU-- [place]  . Vasectomy  02/2013    There were no vitals taken for this visit.  Visit Diagnosis:  No diagnosis found.      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 Pt practiced using first left hand , then right hand then both hands. Pt with improved accuracy using single hands since last visit. Pt continues with increased errors using both hands.  Pt cued to slow down and focus more on accuracy than speed with improved results.  Pt currently working approximately 6 hours per day at home.     Fine Motor Coordination   In Hand Manipulation Training 9  hole peg test R= 31.22  L=41.78. Pt with combined improvement of 29 seconds (goal was 20 seconds combined;  norm for pt's age is 8924)   Manual Therapy   Manual Therapy Myofascial release   Myofascial Release to address neck ROM - gentle. Awaitng MD clarifcation on how aggresssive therapies can be to increase ROM.                  OT Short Term Goals - 01/01/14 1451    OT SHORT TERM GOAL #1   Title Pt will be I with HEP - 12/27/2013   Time 4   Period Weeks   Status Achieved   OT SHORT TERM GOAL #2   Title Pt will demonstrate improved coordination as evidenced by decreasing 9  hole peg test combined by 10 seconds to aide in functional tasks   Time 4   Period Weeks   Status Achieved   OT SHORT TERM GOAL #3   Title Pateint will be mod I with butttoning    Time 4   Period Weeks   Status Achieved   OT SHORT TERM GOAL #4   Title Pt will be mod I with tying shoes with AE prn  Time 4   Period Weeks   Status Achieved           OT Long Term Goals - 01/01/14 1451    OT LONG TERM GOAL #1   Title Pt will be I with upgraded HEP prn - 01/25/2014   Time 8   Period Weeks   Status On-going   OT LONG TERM GOAL #2   Title Pt will demonstrate improved coordination by decreasing 9 hole peg test combined by 20 seconds to assist wtih functional activity   Time 8   Period Weeks   Status On-going   OT LONG TERM GOAL #3   Title Patient will be I with laundry   Time 8   Period Weeks   Status Achieved   OT LONG TERM GOAL #4   Title Patient will be able to type with both hands within reasonable period of time in preparation to returning to work.   Time 8   Period Weeks   Status On-going   OT LONG TERM GOAL #5   Title Patient will be I with  meal prep at ambulatory level   Time 8   Period Weeks   Status On-going               Plan - 01/02/14 1706    Clinical Impression Statement Pt making excellent progress.  Have contacted MD again for clarification regarding UE  strengthening and neck ROM.     Rehab Potential Good   Clinical Impairments Affecting Rehab Potential Pt will beneft form skilled OT to address the following:  decreased bilateral coordination, decreased funcitonal use of BUE's, decreased strength BUE's   OT Frequency 2x / week   OT Duration 8 weeks   OT Treatment/Interventions Self-care/ADL training;Moist Heat;Neuromuscular education;Therapeutic exercise;DME and/or AE instruction;Functional Mobility Training;Manual Therapy;Therapeutic activities;Patient/family education;Balance training;Therapeutic exercises;Parrafin;Cryotherapy;Ultrasound;Fluidtherapy   Plan fine motor coordination, gentle neck ROM        Problem List Patient Active Problem List   Diagnosis Date Noted  . Neurofibromatosis, type 1 12/14/2013  . Preventative health care 06/23/2010  . HORNER'S SYNDROME 04/12/2009    Norton PastelPulaski, Karen Halliday, OTR/L 01/02/2014, 5:11 PM  [place] [facility] [address] [place], [place], [address] Phone: [phone]   Fax:  [phone]

## 2014-01-03 ENCOUNTER — Encounter: Payer: Self-pay | Admitting: Physical Therapy

## 2014-01-03 ENCOUNTER — Ambulatory Visit: Payer: 59 | Admitting: Physical Therapy

## 2014-01-03 DIAGNOSIS — Z9889 Other specified postprocedural states: Secondary | ICD-10-CM | POA: Diagnosis not present

## 2014-01-03 DIAGNOSIS — R269 Unspecified abnormalities of gait and mobility: Secondary | ICD-10-CM

## 2014-01-03 DIAGNOSIS — R531 Weakness: Secondary | ICD-10-CM

## 2014-01-03 NOTE — Therapy (Signed)
[facility] [address] [place], [place], [address] Phone: [phone]   Fax:  [phone]  Physical Therapy Treatment  Patient Details  Name: Larry HammondJoshua Riley MRN: [id number] Date of Birth: 1980-02-03  Encounter Date: 01/03/2014      PT End of Session - 01/03/14 1311    Visit Number 18   Number of Visits 20   Date for PT Re-Evaluation 01/14/14   Authorization Type [employer]- visit limit 20 for PT, 20 for OT.   Authorization - Number of Visits 20      Past Medical History  Diagnosis Date  . Neurofibromatosis     tumor down spine and brain  . MRSA (methicillin resistant Staphylococcus aureus) 2005    leg    Past Surgical History  Procedure Laterality Date  . Neck surgery       tumor removal  . Hydrocephalus      [place]  . Spine surgery  10/2013    c2-t2 fusion  , laminectomy c1-6-- [facility]-- [place]  . Vasectomy  02/2013    There were no vitals taken for this visit.  Visit Diagnosis:  Decreased strength  Abnormality of gait      Subjective Assessment - 01/03/14 1305    Symptoms Doing fine - no new c/o's; pt. ambulating into clinic without use of RW today   Pertinent History von Reckinhausens disease;    Patient Stated Goals Increase LLE strength and improve gait and balance   Currently in Pain? Yes   Pain Score 4    Pain Location Shoulder   Pain Orientation Left   Pain Descriptors / Indicators Aching;Sore   Pain Type Chronic pain                    [facility] Adult PT Treatment/Exercise - 01/03/14 1307    High Level Balance   High Level Balance Comments standing on BOSU; weight shifts anterior and posteriorly; moving RLE up/back and out to side for L single limb stance   Lumbar Exercises: Aerobic   Elliptical 1" forward; 1" backward level 1.6   Lumbar Exercises: Machines for Strengthening   Leg Press 110# bil. LE's: LLE only 70# 20 reps   Lumbar Exercises: Standing   Other  Standing Lumbar Exercises L hip flexion, extension, abduction with green theraband x 10 reps each direction   Other Standing Lumbar Exercises --  10 reps each   Knee/Hip Exercises: Standing   Forward Step Up 10 reps;Step Height: 6"   Step Down 10 reps;Step Height: 6";4 sets  LLE   Knee/Hip Exercises: Seated   Other Seated Knee Exercises seated L hamstring with blue theraband x 20 reps   Ankle Exercises: Standing   Heel Raises 10 reps  off hi/lo table x 20 reps for closed chain strengthening                PT Education - 01/03/14 1311    Education provided Yes   Education Details heel raises LLE   Person(s) Educated Patient;Spouse   Methods Explanation;Demonstration   Comprehension Verbalized understanding;Returned demonstration          PT Short Term Goals - 12/18/13 2107    PT SHORT TERM GOAL #2   Title timed up and go score <=23 seconds (12/15/13)   Baseline score 11 secs with no device   Status Achieved           PT Long Term Goals - 12/28/13 1621  PT LONG TERM GOAL #1   Title demonstrate and /or verbalized techniques to redue the risk of re-inury to inclued info on : physical activity (01/12/14)   Baseline 01-17-14   Time 3   Period Weeks   Status On-going   PT LONG TERM GOAL #2   Title be independent with advanced HEP for strengthening (01/12/14)   Baseline 01-17-14   Time 3   Period Weeks   Status On-going   PT LONG TERM GOAL #3   Title timed up and go score <15 seconds (01/12/14)   Baseline 01-17-14   Time 3   Period Weeks   Status On-going               Plan - 01/03/14 1312    Clinical Impression Statement pt. progressing well towards goals; continues to have LLE weakness   Pt will benefit from skilled therapeutic intervention in order to improve on the following deficits Abnormal gait;Decreased coordination;Pain;Difficulty walking;Decreased balance;Decreased strength;Decreased range of motion;Decreased activity tolerance;Decreased endurance    Rehab Potential Good   PT Frequency 3x / week   PT Duration --  1 wk   PT Treatment/Interventions ADLs/Self Care Home Management;Therapeutic activities;Therapeutic exercise;Gait training;Balance training;Stair training;Neuromuscular re-education;Energy conservation;Functional mobility training   PT Next Visit Plan continue LLE strengthening and balance retraining   Consulted and Agree with Plan of Care Patient   Family Member Consulted spouse        Problem List Patient Active Problem List   Diagnosis Date Noted  . Neurofibromatosis, type 1 12/14/2013  . Preventative health care 06/23/2010  . [facility] 04[facility]    Kary Kosilday, Everlyn Farabaugh Suzanne, PT 01/03/2014, 1:14 PM  [facility] [address] [place], [place], [address] Phone: [phone]   Fax:  [phone]

## 2014-01-08 ENCOUNTER — Ambulatory Visit: Payer: 59 | Admitting: Physical Therapy

## 2014-01-08 ENCOUNTER — Telehealth: Payer: Self-pay | Admitting: Occupational Therapy

## 2014-01-08 ENCOUNTER — Ambulatory Visit: Payer: 59 | Admitting: Occupational Therapy

## 2014-01-08 NOTE — Telephone Encounter (Signed)
Called pt to clarify if he wanted to cancel OT as well as PT appts. today.  Pt reported that he and Clydie BraunKaren (OT) had discussed placing OT on hold (due to limited insurance visits) until pt was cleared for strengthening.  Therefore, cancelled all OT appts. This week.  Pt to call before next appt. To see if clearance received.  Pt verbalized agreement.

## 2014-01-09 ENCOUNTER — Ambulatory Visit: Payer: 59 | Admitting: Physical Therapy

## 2014-01-09 ENCOUNTER — Encounter: Payer: Self-pay | Admitting: Occupational Therapy

## 2014-01-09 ENCOUNTER — Ambulatory Visit: Payer: 59 | Admitting: Occupational Therapy

## 2014-01-09 NOTE — Therapy (Signed)
Patient is currently on hold for Occupational Therapy Services at this time while awaiting clearance from MD for Upper extremity strengthening.

## 2014-01-11 ENCOUNTER — Encounter: Payer: 59 | Admitting: Occupational Therapy

## 2014-01-15 ENCOUNTER — Ambulatory Visit: Payer: 59 | Attending: Neurosurgery | Admitting: Physical Therapy

## 2014-01-15 ENCOUNTER — Ambulatory Visit: Payer: 59 | Admitting: Occupational Therapy

## 2014-01-15 DIAGNOSIS — M542 Cervicalgia: Secondary | ICD-10-CM | POA: Diagnosis not present

## 2014-01-15 DIAGNOSIS — Z9889 Other specified postprocedural states: Secondary | ICD-10-CM | POA: Insufficient documentation

## 2014-01-15 DIAGNOSIS — R269 Unspecified abnormalities of gait and mobility: Secondary | ICD-10-CM

## 2014-01-15 DIAGNOSIS — R531 Weakness: Secondary | ICD-10-CM

## 2014-01-16 ENCOUNTER — Ambulatory Visit: Payer: 59 | Admitting: Physical Therapy

## 2014-01-16 ENCOUNTER — Encounter: Payer: Self-pay | Admitting: Physical Therapy

## 2014-01-16 NOTE — Therapy (Signed)
[facility] [address] [place], [place], [address] Phone: [phone]   Fax:  [phone]  Physical Therapy Treatment  Patient Details  Name: Larry Riley MRN: [id number] Date of Birth: April 25, 1980  Encounter Date: 01/15/2014      PT End of Session - 01/16/14 1310    PT Start Time 1317   PT Stop Time 1405   PT Time Calculation (min) 48 min      Past Medical History  Diagnosis Date  . Neurofibromatosis     tumor down spine and brain  . MRSA (methicillin resistant Staphylococcus aureus) 2005    leg    Past Surgical History  Procedure Laterality Date  . Neck surgery       tumor removal  . Hydrocephalus      [place]unt 1999[place]  . Spine surgery  10/2013    c2-t2 fusion  , laminectomy c1-6-- Dr Raynald Kemp-- [place]  . Vasectomy  02/2013    There were no vitals taken for this visit.  Visit Diagnosis:  Abnormality of gait  Decreased strength      Subjective Assessment - 01/16/14 0842    Symptoms Pt. states he got his brace for L leg last Wednesday - hasn't used walker since - doing fine with ambulation   Pertinent History von Reckinhausens disease;    Patient Stated Goals Increase LLE strength and improve gait and balance   Currently in Pain? Yes   Pain Score 6    Pain Location Neck   Pain Descriptors / Indicators Aching;Sore   Pain Type Chronic pain   Pain Onset More than a month ago   Aggravating Factors  pt. reports he is out of pain medication   Multiple Pain Sites No                    [facility] Adult PT Treatment/Exercise - 01/16/14 1256    Ambulation/Gait   Ambulation/Gait Yes   Ambulation/Gait Assistance 6: Modified independent (Device/Increase time)   Ambulation/Gait Assistance Details pt. wearing AFO on LLE - ottobock reaction brace but not wearing T strap   Ambulation Distance (Feet) 250 Feet   Assistive device None   Stairs Yes   Stairs Assistance 6: Modified independent  (Device/Increase time)   Number of Stairs 4   Height of Stairs 6   Lumbar Exercises: Aerobic   Elliptical 1" forward; 30 secs backward level 1.5   Lumbar Exercises: Machines for Strengthening   Leg Press 110# bil. LE's: LLE only 70# 20 reps   Lumbar Exercises: Standing   Heel Raises 10 reps  brace removed from LLE - off high low table   Heel Raises Limitations weakness in left plantarflexors   Other Standing Lumbar Exercises L hip flexion, extension, abduction with green theraband x 10 reps each direction     Neuro Re-ed: standing on BOSU lifting RLE up/back and laterally x 10 reps; cone taps with R and L foot x 3 reps each with CGA to SBA:  Tipping cones over and back upright to improve single limb stance with CGA; Ladder to improve single limb stance without device with CGA             PT Short Term Goals - 12/18/13 2107    PT SHORT TERM GOAL #2   Title timed up and go score <=23 seconds (12/15/13)   Baseline score 11 secs with no device   Status Achieved  PT Long Term Goals - 12/28/13 1621    PT LONG TERM GOAL #1   Title demonstrate and /or verbalized techniques to redue the risk of re-inury to inclued info on : physical activity (01/12/14)   Baseline 01-17-14   Time 3   Period Weeks   Status On-going   PT LONG TERM GOAL #2   Title be independent with advanced HEP for strengthening (01/12/14)   Baseline 01-17-14   Time 3   Period Weeks   Status On-going   PT LONG TERM GOAL #3   Title timed up and go score <15 seconds (01/12/14)   Baseline 01-17-14   Time 3   Period Weeks   Status On-going               Plan - 01/16/14 1306    Clinical Impression Statement AFO on LLE significantly improves gait - pt. now able to safely amb. without RW on all surfaces modified independently   Pt will benefit from skilled therapeutic intervention in order to improve on the following deficits Abnormal gait;Decreased coordination;Pain;Difficulty walking;Decreased  balance;Decreased strength;Decreased range of motion;Decreased activity tolerance;Decreased endurance   PT Frequency 2x / week   PT Duration --  1 week   PT Treatment/Interventions ADLs/Self Care Home Management;Therapeutic activities;Therapeutic exercise;Gait training;Balance training;Stair training;Neuromuscular re-education;Energy conservation;Functional mobility training   PT Next Visit Plan check LTG's and D/C   PT Home Exercise Plan as instructed with additional hip exercises   Consulted and Agree with Plan of Care Patient   Family Member Consulted spouse        Problem List Patient Active Problem List   Diagnosis Date Noted  . Neurofibromatosis, type 1 12/14/2013  . Preventative health care 06/23/2010  . HORNER'S SYNDROME 04/12/2009    Kary Kosilday, Colburn Asper Suzanne, PT 01/16/2014, 1:14 PM  [facility] [address] [place], [place], [address] Phone: [phone]   Fax:  [phone]

## 2014-01-18 ENCOUNTER — Ambulatory Visit: Payer: 59 | Admitting: Physical Therapy

## 2014-01-18 ENCOUNTER — Ambulatory Visit: Payer: 59 | Admitting: Occupational Therapy

## 2014-01-18 DIAGNOSIS — R531 Weakness: Secondary | ICD-10-CM

## 2014-01-18 DIAGNOSIS — R269 Unspecified abnormalities of gait and mobility: Secondary | ICD-10-CM

## 2014-01-18 DIAGNOSIS — Z9889 Other specified postprocedural states: Secondary | ICD-10-CM | POA: Diagnosis not present

## 2014-01-19 ENCOUNTER — Encounter: Payer: Self-pay | Admitting: Physical Therapy

## 2014-01-19 NOTE — Therapy (Signed)
[facility] [address] [place], [place], [address] Phone: [phone]   Fax:  [phone]  Physical Therapy Treatment  Patient Details  Name: Larry Riley MRN: [id number] Date of Birth: Jun 10, 1980 Referring Provider:  Rosalita Chessman, DO  Encounter Date: 01/18/2014 PHYSICAL THERAPY DISCHARGE SUMMARY  Visits from Start of Care: 20  Current functional level related to goals / functional outcomes: See below for progress towards LTG's; pt. now able to ambulate without device with use of AFO on LLE   Remaining deficits: Continued decreased cervical AROM due to surgery;   minimally decr. strength in LLE with pt. wearing AFO on LLE   Education / Equipment: Pt. has been instructed in HEP and exercise program to continue at gym for PRE's and cardiovascular conditioning Plan: Patient agrees to discharge.  Patient goals were met. Patient is being discharged due to meeting the stated rehab goals.  ?????         PT End of Session - 01/19/14 0941    Visit Number 20   Number of Visits [place]- visit limit 20 for PT, 20 for OT.   Authorization - Number of Visits 20   PT Start Time 1324   PT Stop Time 1403   PT Time Calculation (min) 47 min      Past Medical History  Diagnosis Date  . Neurofibromatosis     tumor down spine and brain  . MRSA (methicillin resistant Staphylococcus aureus) 2005    leg    Past Surgical History  Procedure Laterality Date  . Neck surgery       tumor removal  . Hydrocephalus      [place]unt 1999[place]  . Spine surgery  10/2013    c2-t2 fusion  , laminectomy c1-6-- Dr Rigoberto Noel-- [place]  . Vasectomy  02/2013    There were no vitals taken for this visit.  Visit Diagnosis:  Abnormality of gait  Decreased strength      Subjective Assessment - 01/19/14 0934    Symptoms Pt. states he is doing well with ambulation since he received the brace - plans on returning to work on  Monday - plans on joining gym to continue with exercises   Pertinent History von Reckinhausens disease;    Currently in Pain? Yes   Pain Score 6    Pain Location Neck   Pain Orientation Medial   Pain Descriptors / Indicators Aching;Sore   Pain Type Chronic pain   Pain Onset More than a month ago   Pain Frequency Occasional   Multiple Pain Sites No                    [facility] Adult PT Treatment/Exercise - 01/19/14 0938    Standardized Balance Assessment   Standardized Balance Assessment Timed Up and Go Test   Timed Up and Go Test   Normal TUG (seconds) 8.95   Lumbar Exercises: Aerobic   Elliptical 1" forward; 1" backward level 1.5   Lumbar Exercises: Machines for Strengthening   Leg Press 110# bil. LE's: LLE only 70# 20 reps   Lumbar Exercises: Standing   Heel Raises 10 reps   Other Standing Lumbar Exercises L hip flexion, extension, abduction with green theraband x 10 reps each direction     Pt. Performed left plantarflexion off hi/lo mat table x 3 sets 10 reps with CGA; instructed pt.'s wife in how to assist pt. In performing this exercise at home; discussed continuation of  exercise program at gym with pt. Given written copy of current Amount of weights used for PRE's on machines in clinic Discussed exercises which are a priority based on LLE weakness (hamstring and plantarflexors) Discussed LTG's and progress with pt. and wife           PT Education - 01/19/14 0940    Education provided Yes   Education Details exercises, weight amount for machines at gym based on performance in clinic   Person(s) Educated Patient;Spouse   Methods Explanation;Demonstration;Handout   Comprehension Verbalized understanding;Returned demonstration          PT Short Term Goals - 12/18/13 2107    PT SHORT TERM GOAL #2   Title timed up and go score <=23 seconds (12/15/13)   Baseline score 11 secs with no device   Status Achieved           PT Long Term Goals - 01/19/14  0943    PT LONG TERM GOAL #1   Title demonstrate and /or verbalized techniques to redue the risk of re-inury to inclued info on : physical activity (01/12/14)   Baseline met 01-18-14   Status Achieved   PT LONG TERM GOAL #2   Title be independent with advanced HEP for strengthening (01/12/14)   Baseline met 01-18-14   Status Achieved   PT LONG TERM GOAL #3   Title timed up and go score <15 seconds (01/12/14)   Baseline score 8.75 secs.   Time 3   Period Weeks   Status Achieved   PT LONG TERM GOAL #4   Title return to work due to increased bilateral lower extremity and increased endurance (01/12/14)   Baseline goal met 01-18-14   Status Achieved   PT LONG TERM GOAL #5   Title perfomr home activities with >60% greater ease  (Target Date: 01/12/14)   Baseline met 01-18-14   Status Achieved   PT LONG TERM GOAL #6   Title ability to step onto a curb due to increased bilateral lower extremity strength (Target Date: 01/12/14)   Status Achieved   PT LONG TERM GOAL #7   Title Reports new surgical /muscle pain (not chronic) increases <2 increments with work simulated activites / ADLs. (Target Date: 01/12/14)   Status Achieved               Plan - 01/19/14 0941    Clinical Impression Statement Pt. has met all LTG's - ready for D/C; ambulating independently without device with AFO on LLE   Pt will benefit from skilled therapeutic intervention in order to improve on the following deficits Abnormal gait;Decreased coordination;Pain;Difficulty walking;Decreased balance;Decreased strength;Decreased range of motion;Decreased activity tolerance;Decreased endurance   Rehab Potential Good   PT Frequency 2x / week   PT Duration 4 weeks   PT Treatment/Interventions ADLs/Self Care Home Management;Therapeutic activities;Therapeutic exercise;Gait training;Balance training;Stair training;Neuromuscular re-education;Energy conservation;Functional mobility training   PT Next Visit Plan D/C 01-18-14   PT Home Exercise Plan  pt. to cont. at gym   Consulted and Agree with Plan of Care Patient;Family member/caregiver        Problem List Patient Active Problem List   Diagnosis Date Noted  . Neurofibromatosis, type 1 12/14/2013  . Preventative health care 06/23/2010  . [facility] SYNDROME 04/12/2009    Alda Lea, PT 01/19/2014, 9:46 AM  [facility] [address] [place], [place], [address] Phone: [phone]   Fax:  [phone]

## 2014-01-22 ENCOUNTER — Ambulatory Visit: Payer: 59 | Admitting: Physical Therapy

## 2014-01-22 ENCOUNTER — Ambulatory Visit: Payer: 59 | Admitting: Occupational Therapy

## 2014-01-23 ENCOUNTER — Ambulatory Visit: Payer: 59 | Admitting: Physical Therapy

## 2014-01-25 ENCOUNTER — Ambulatory Visit: Payer: 59 | Admitting: Physical Therapy

## 2014-01-25 ENCOUNTER — Ambulatory Visit: Payer: 59 | Admitting: Occupational Therapy

## 2014-02-06 ENCOUNTER — Encounter: Payer: Self-pay | Admitting: Occupational Therapy

## 2014-02-06 NOTE — Therapy (Unsigned)
[facility] [address] [place], [place], [address] Phone: [phone]   Fax:  [phone]  Patient Details  Name: Larry Riley MRN: [id number] Date of Birth: September 16, 1980 Referring Provider:  No ref. provider found  Encounter Date: 02/06/2014  OCCUPATIONAL THERAPY DISCHARGE SUMMARY  Visits from Start of Care: ***  Current functional level related to goals / functional outcomes: OT Long Term Goals - 01/01/14 1451    OT LONG TERM GOAL #1   Title Pt will be I with upgraded HEP prn - 01/25/2014   Time 8   Period Weeks   Status On-going   OT LONG TERM GOAL #2   Title Pt will demonstrate improved coordination by decreasing 9 hole peg test combined by 20 seconds to assist wtih functional activity   Time 8   Period Weeks   Status On-going   OT LONG TERM GOAL #3   Title Patient will be I with laundry   Time 8   Period Weeks   Status Achieved   OT LONG TERM GOAL #4   Title Patient will be able to type with both hands within reasonable period of time in preparation to returning to work.   Time 8   Period Weeks   Status On-going   OT LONG TERM GOAL #5   Title Patient will be I with meal prep at ambulatory level   Time 8   Period Weeks   Status On-going   PATIENT HAS ACTUALLY MET ALL LONG TERM GOALS.  Pt had been placed on hold to in order to contact MD to determine if pt could be cleared for UE strengthening program and more aggressive ROM to cervical area. Despite multiple attempts to contact surgeon and asking pt to attempt to contact surgeon, pt was never cleared for this activity therefore pt will be discharged from OT services at this time.  Pt is aware and in agreement.   Remaining deficits: Decreased cervical ROM, tightness in shoulder girdles.   Education / Equipment: HEP Plan: Patient agrees to discharge.  Patient goals were met. Patient  is being discharged due to meeting the stated rehab goals.  ?????     Quay Burow, OTR/L 02/06/2014, 9:06 AM  [facility] [address] [place], [place], [address] Phone: [phone]   Fax:  [phone]

## 2014-02-20 ENCOUNTER — Ambulatory Visit (INDEPENDENT_AMBULATORY_CARE_PROVIDER_SITE_OTHER): Payer: 59 | Admitting: Family Medicine

## 2014-02-20 ENCOUNTER — Encounter: Payer: Self-pay | Admitting: Family Medicine

## 2014-02-20 VITALS — BP 126/82 | HR 67 | Wt 170.4 lb

## 2014-02-20 DIAGNOSIS — T7840XA Allergy, unspecified, initial encounter: Secondary | ICD-10-CM

## 2014-02-20 DIAGNOSIS — G894 Chronic pain syndrome: Secondary | ICD-10-CM | POA: Diagnosis not present

## 2014-02-20 MED ORDER — CYCLOBENZAPRINE HCL 10 MG PO TABS: 10.0000 mg | ORAL_TABLET | Freq: Three times a day (TID) | ORAL | Status: DC | PRN

## 2014-02-20 MED ORDER — EPINEPHRINE 0.3 MG/0.3ML IJ SOAJ: 0.3000 mg | Freq: Once | INTRAMUSCULAR | Status: DC

## 2014-02-20 MED ORDER — TRAMADOL HCL 50 MG PO TABS: 50.0000 mg | ORAL_TABLET | Freq: Three times a day (TID) | ORAL | Status: DC | PRN

## 2014-02-20 NOTE — Progress Notes (Signed)
Pre visit review using our clinic review tool, if applicable. No additional management support is needed unless otherwise documented below in the visit note. 

## 2014-02-20 NOTE — Patient Instructions (Signed)

## 2014-02-20 NOTE — Progress Notes (Signed)
Subjective:    Patient ID: Larry Riley, male    DOB: 11-May-1980, 34 y.o.   MRN: [id number]  HPI  Patient here f/u allergic reaction to shrimp? -- he went to dinner about 10 days ago and had shrimp./ crab--- he developed a rash and :"felt sick"-- he took benadryl and was fine the next day.     Past Medical History  Diagnosis Date  . Neurofibromatosis     tumor down spine and brain  . MRSA (methicillin resistant Staphylococcus aureus) 2005    leg    Review of Systems  Constitutional: Positive for fatigue. Negative for unexpected weight change.  Respiratory: Negative for cough and shortness of breath.   Cardiovascular: Negative for chest pain and palpitations.       Objective:    Physical Exam  Constitutional: He appears well-developed and well-nourished. No distress.  Cardiovascular: Normal rate, regular rhythm and normal heart sounds.   Pulmonary/Chest: Effort normal and breath sounds normal. No respiratory distress.  Psychiatric: He has a normal mood and affect. His behavior is normal. Judgment and thought content normal.    BP 126/82 mmHg  Pulse 67  Wt 170 lb 6.4 oz (77.293 kg)  SpO2 99% Wt Readings from Last 3 Encounters:  02/20/14 170 lb 6.4 oz (77.293 kg)  12/14/13 168 lb (76.204 kg)  12/12/12 180 lb (81.647 kg)     Lab Results  Component Value Date   WBC 6.0 12/14/2013   HGB 13.1 12/14/2013   HCT 39.3 12/14/2013   PLT 572.0* 12/14/2013   GLUCOSE 90 12/14/2013   CHOL 174 12/14/2013   TRIG 92.0 12/14/2013   HDL 43.80 12/14/2013   LDLCALC 112* 12/14/2013   ALT 29 12/14/2013   AST 19 12/14/2013   NA 139 12/14/2013   K 3.8 12/14/2013   CL 106 12/14/2013   CREATININE 0.9 12/14/2013   BUN 15 12/14/2013   CO2 25 12/14/2013   TSH 1.80 12/14/2013    Ct Cervical Spine Wo Contrast  08/25/2013   CLINICAL DATA:  Neurofibromatosis. Neck pain and BILATERAL arm and hand weakness.  EXAM: CT CERVICAL SPINE WITHOUT CONTRAST  TECHNIQUE: Multidetector CT imaging  of the cervical spine was performed without intravenous contrast. Multiplanar CT image reconstructions were also generated.  COMPARISON:  MRI cervical spine 08/11/2013.  FINDINGS: BILATERAL intraspinal and extra-spinal neurofibromas a are observed extending from the skull base into the LEFT posterior mediastinum. Neural foraminal narrowing is prominent bilaterally from C2-3 through C7-T1. There is been previous cervical fusion with instrumentation C2-C3-C4 which appears solid on the RIGHT. Laminectomy with fat graft has allow for improved diameter the spinal canal central and to the LEFT at these levels. There is trace anterolisthesis at C2-C3 with disc space narrowing at C2-3 and C3-4. BILATERAL neck masses represent neurofibromata, not lymph nodes. Airway midline. The retropharyngeal space is expanded by neurofibromatosis anterior to C2 and C3. Significant cord compression is suspected in the canal at the C1 level.  IMPRESSION: Changes of neurofibromatosis type 1 as described. Solid fusion C2 through C4 with improved diameter of the spinal canal following laminectomy and fat graft.   Electronically Signed   By: Davonna Belling M.D.   On: 08/25/2013 09:34       Assessment & Plan:   Problem List Items Addressed This Visit    Allergic reaction - Primary   Relevant Medications   EPINEPHrine (EPIPEN 2-PAK) 0.3 mg/0.3 mL IJ SOAJ injection   Other Relevant Orders   Allergen food profile specific  IgE    Other Visit Diagnoses    Chronic pain syndrome        Relevant Medications    traMADol (ULTRAM) tablet 50 mg    cyclobenzaprine (FLEXERIL) tablet        Loreen FreudYvonne Lowne, DO

## 2014-02-21 DIAGNOSIS — G894 Chronic pain syndrome: Secondary | ICD-10-CM | POA: Insufficient documentation

## 2014-02-21 NOTE — Assessment & Plan Note (Signed)
Antihistamine prn Epi pen prn Check allergy panel

## 2014-02-21 NOTE — Assessment & Plan Note (Signed)
Secondary to recent surgery  Ultram prn rto prn

## 2014-02-22 LAB — ALLERGEN FOOD PROFILE SPECIFIC IGE
Corn: <0.1 kU/L
Fish Cod: <0.1 kU/L
IgE (Immunoglobulin E), Serum: 6 kU/L (ref <115)
Milk IgE: <0.1 kU/L
Orange: <0.1 kU/L
Soybean IgE: <0.1 kU/L
Tomato IgE: <0.1 kU/L
Tuna IgE: <0.1 kU/L

## 2014-02-23 ENCOUNTER — Other Ambulatory Visit: Payer: Self-pay

## 2014-02-23 DIAGNOSIS — T781XXA Other adverse food reactions, not elsewhere classified, initial encounter: Secondary | ICD-10-CM

## 2014-02-23 DIAGNOSIS — T61781A Other shellfish poisoning, accidental (unintentional), initial encounter: Secondary | ICD-10-CM

## 2014-02-27 ENCOUNTER — Encounter: Payer: Self-pay | Admitting: Family Medicine

## 2014-03-09 ENCOUNTER — Telehealth: Payer: Self-pay | Admitting: Family Medicine

## 2014-03-09 NOTE — Telephone Encounter (Signed)
See below

## 2014-03-09 NOTE — Telephone Encounter (Signed)
[place] [facility] [place] [facility] [place] [facility]. Will you Dr Durene CalHunter or Dr Selena BattenKim except him as a transfer.

## 2014-03-09 NOTE — Telephone Encounter (Signed)
I will accept. Probably could see him in June.

## 2014-03-11 NOTE — Telephone Encounter (Signed)
Thks. Just got to this today.

## 2014-03-19 ENCOUNTER — Telehealth: Payer: Self-pay | Admitting: Family Medicine

## 2014-03-19 DIAGNOSIS — G894 Chronic pain syndrome: Secondary | ICD-10-CM

## 2014-03-19 MED ORDER — CYCLOBENZAPRINE HCL 10 MG PO TABS: 10.0000 mg | ORAL_TABLET | Freq: Three times a day (TID) | ORAL | Status: DC | PRN

## 2014-03-19 MED ORDER — TRAMADOL HCL 50 MG PO TABS: 50.0000 mg | ORAL_TABLET | Freq: Three times a day (TID) | ORAL | Status: DC | PRN

## 2014-03-19 NOTE — Telephone Encounter (Signed)
Caller name:Sciarra, Ivin BootyJoshua Relation to MW:UXLKpt:self Call back [phone] Pharmacy:  Reason for call: pt is needing rx   cyclobenzaprine (FLEXERIL) 10 MG tablet      And traMADol (ULTRAM) 50 MG tablet  Please call when available for pick up

## 2014-03-19 NOTE — Telephone Encounter (Signed)
Last seen and filled 02/20/14   Tramadol #30 and Flexeril #60 no refills No contract on file and No UDS   Please advise    KP

## 2014-03-19 NOTE — Telephone Encounter (Signed)
Refill both x1 with 2 refills

## 2014-03-19 NOTE — Telephone Encounter (Signed)
Rx faxed.    KP 

## 2014-04-30 ENCOUNTER — Other Ambulatory Visit: Payer: Self-pay | Admitting: Family Medicine

## 2014-06-02 ENCOUNTER — Other Ambulatory Visit: Payer: Self-pay | Admitting: Family Medicine

## 2014-06-13 ENCOUNTER — Other Ambulatory Visit (INDEPENDENT_AMBULATORY_CARE_PROVIDER_SITE_OTHER): Payer: 59

## 2014-06-13 ENCOUNTER — Telehealth: Payer: Self-pay | Admitting: Family

## 2014-06-13 ENCOUNTER — Encounter: Payer: Self-pay | Admitting: Family

## 2014-06-13 ENCOUNTER — Ambulatory Visit (INDEPENDENT_AMBULATORY_CARE_PROVIDER_SITE_OTHER): Payer: 59 | Admitting: Family

## 2014-06-13 VITALS — BP 112/82 | HR 117 | Temp 97.5°F | Resp 18 | Ht 68.0 in | Wt 191.1 lb

## 2014-06-13 DIAGNOSIS — K5909 Other constipation: Secondary | ICD-10-CM

## 2014-06-13 DIAGNOSIS — T402X5A Adverse effect of other opioids, initial encounter: Secondary | ICD-10-CM | POA: Diagnosis not present

## 2014-06-13 DIAGNOSIS — Z0000 Encounter for general adult medical examination without abnormal findings: Secondary | ICD-10-CM

## 2014-06-13 DIAGNOSIS — K5903 Drug induced constipation: Secondary | ICD-10-CM | POA: Insufficient documentation

## 2014-06-13 LAB — CBC
HCT: 43.7 % (ref 39.0–52.0)
HEMOGLOBIN: 15 g/dL (ref 13.0–17.0)
MCHC: 34.3 g/dL (ref 30.0–36.0)
MCV: 88 fl (ref 78.0–100.0)
Platelets: 443 10*3/uL — ABNORMAL HIGH (ref 150.0–400.0)
RBC: 4.96 Mil/uL (ref 4.22–5.81)
RDW: 13.4 % (ref 11.5–15.5)
WBC: 8.7 10*3/uL (ref 4.0–10.5)

## 2014-06-13 LAB — LIPID PANEL
CHOL/HDL RATIO: 3
Cholesterol: 169 mg/dL (ref 0–200)
HDL: 57.6 mg/dL (ref >39.00)
LDL CALC: 92 mg/dL (ref 0–99)
NonHDL: 111.4
TRIGLYCERIDES: 97 mg/dL (ref 0.0–149.0)
VLDL: 19.4 mg/dL (ref 0.0–40.0)

## 2014-06-13 LAB — BASIC METABOLIC PANEL
BUN: 15 mg/dL (ref 6–23)
CALCIUM: 9.6 mg/dL (ref 8.4–10.5)
CHLORIDE: 103 meq/L (ref 96–112)
CO2: 29 meq/L (ref 19–32)
CREATININE: 0.88 mg/dL (ref 0.40–1.50)
GFR: 105.41 mL/min (ref >60.00)
Glucose, Bld: 85 mg/dL (ref 70–99)
POTASSIUM: 4.5 meq/L (ref 3.5–5.1)
Sodium: 137 mEq/L (ref 135–145)

## 2014-06-13 LAB — TSH: TSH: 2.12 u[IU]/mL (ref 0.35–4.50)

## 2014-06-13 MED ORDER — NALOXEGOL OXALATE 25 MG PO TABS: 25.0000 mg | ORAL_TABLET | Freq: Every day | ORAL | Status: DC

## 2014-06-13 NOTE — Patient Instructions (Addendum)
Thank you for choosing [facility].  Summary/Instructions:  Start colace as needed for constipation.  Continue to take your medications as prescribed.   Your prescription(s) have been submitted to your pharmacy or been printed and provided for you. Please take as directed and contact our office if you believe you are having problem(s) with the medication(s) or have any questions.  Please stop by the lab on the basement level of the building for your blood work. Your results will be released to MyChart (or called to you) after review, usually within 72 hours after test completion. If any changes need to be made, you will be notified at that same time.  If your symptoms worsen or fail to improve, please contact our office for further instruction, or in case of emergency go directly to the emergency room at the closest medical facility.    Health Maintenance A healthy lifestyle and preventative care can promote health and wellness.  Maintain regular health, dental, and eye exams.  Eat a healthy diet. Foods like vegetables, fruits, whole grains, low-fat dairy products, and lean protein foods contain the nutrients you need and are low in calories. Decrease your intake of foods high in solid fats, added sugars, and salt. Get information about a proper diet from your health care provider, if necessary.  Regular physical exercise is one of the most important things you can do for your health. Most adults should get at least 150 minutes of moderate-intensity exercise (any activity that increases your heart rate and causes you to sweat) each week. In addition, most adults need muscle-strengthening exercises on 2 or more days a week.   Maintain a healthy weight. The body mass index (BMI) is a screening tool to identify possible weight problems. It provides an estimate of body fat based on height and weight. Your health care provider can find your BMI and can help you achieve or maintain a  healthy weight. For males 20 years and older:  A BMI below 18.5 is considered underweight.  A BMI of 18.5 to 24.9 is normal.  A BMI of 25 to 29.9 is considered overweight.  A BMI of 30 and above is considered obese.  Maintain normal blood lipids and cholesterol by exercising and minimizing your intake of saturated fat. Eat a balanced diet with plenty of fruits and vegetables. Blood tests for lipids and cholesterol should begin at age 64 and be repeated every 5 years. If your lipid or cholesterol levels are high, you are over age 55, or you are at high risk for heart disease, you may need your cholesterol levels checked more frequently.Ongoing high lipid and cholesterol levels should be treated with medicines if diet and exercise are not working.  If you smoke, find out from your health care provider how to quit. If you do not use tobacco, do not start.  Lung cancer screening is recommended for adults aged 55-80 years who are at high risk for developing lung cancer because of a history of smoking. A yearly low-dose CT scan of the lungs is recommended for people who have at least a 30-pack-year history of smoking and are current smokers or have quit within the past 15 years. A pack year of smoking is smoking an average of 1 pack of cigarettes a day for 1 year (for example, a 30-pack-year history of smoking could mean smoking 1 pack a day for 30 years or 2 packs a day for 15 years). Yearly screening should continue until the smoker has stopped  smoking for at least 15 years. Yearly screening should be stopped for people who develop a health problem that would prevent them from having lung cancer treatment.  If you choose to drink alcohol, do not have more than 2 drinks per day. One drink is considered to be 12 oz (360 mL) of beer, 5 oz (150 mL) of wine, or 1.5 oz (45 mL) of liquor.  Avoid the use of street drugs. Do not share needles with anyone. Ask for help if you need support or instructions about  stopping the use of drugs.  High blood pressure causes heart disease and increases the risk of stroke. Blood pressure should be checked at least every 1-2 years. Ongoing high blood pressure should be treated with medicines if weight loss and exercise are not effective.  If you are 11-62 years old, ask your health care provider if you should take aspirin to prevent heart disease.  Diabetes screening involves taking a blood sample to check your fasting blood sugar level. This should be done once every 3 years after age 50 if you are at a normal weight and without risk factors for diabetes. Testing should be considered at a younger age or be carried out more frequently if you are overweight and have at least 1 risk factor for diabetes.  Colorectal cancer can be detected and often prevented. Most routine colorectal cancer screening begins at the age of 60 and continues through age 54. However, your health care provider may recommend screening at an earlier age if you have risk factors for colon cancer. On a yearly basis, your health care provider may provide home test kits to check for hidden blood in the stool. A small camera at the end of a tube may be used to directly examine the colon (sigmoidoscopy or colonoscopy) to detect the earliest forms of colorectal cancer. Talk to your health care provider about this at age 56 when routine screening begins. A direct exam of the colon should be repeated every 5-10 years through age 37, unless early forms of precancerous polyps or small growths are found.  People who are at an increased risk for hepatitis B should be screened for this virus. You are considered at high risk for hepatitis B if:  You were born in a country where hepatitis B occurs often. Talk with your health care provider about which countries are considered high risk.  Your parents were born in a high-risk country and you have not received a shot to protect against hepatitis B (hepatitis B  vaccine).  You have HIV or AIDS.  You use needles to inject street drugs.  You live with, or have sex with, someone who has hepatitis B.  You are a man who has sex with other men (MSM).  You get hemodialysis treatment.  You take certain medicines for conditions like cancer, organ transplantation, and autoimmune conditions.  Hepatitis C blood testing is recommended for all people born from [birth year] through 1965 and any individual with known risk factors for hepatitis C.  Healthy men should no longer receive prostate-specific antigen (PSA) blood tests as part of routine cancer screening. Talk to your health care provider about prostate cancer screening.  Testicular cancer screening is not recommended for adolescents or adult males who have no symptoms. Screening includes self-exam, a health care provider exam, and other screening tests. Consult with your health care provider about any symptoms you have or any concerns you have about testicular cancer.  Practice safe sex. Use condoms  and avoid high-risk sexual practices to reduce the spread of sexually transmitted infections (STIs).  You should be screened for STIs, including gonorrhea and chlamydia if:  You are sexually active and are younger than 24 years.  You are older than 24 years, and your health care provider tells you that you are at risk for this type of infection.  Your sexual activity has changed since you were last screened, and you are at an increased risk for chlamydia or gonorrhea. Ask your health care provider if you are at risk.  If you are at risk of being infected with HIV, it is recommended that you take a prescription medicine daily to prevent HIV infection. This is called pre-exposure prophylaxis (PrEP). You are considered at risk if:  You are a man who has sex with other men (MSM).  You are a heterosexual man who is sexually active with multiple partners.  You take drugs by injection.  You are sexually active  with a partner who has HIV.  Talk with your health care provider about whether you are at high risk of being infected with HIV. If you choose to begin PrEP, you should first be tested for HIV. You should then be tested every 3 months for as long as you are taking PrEP.  Use sunscreen. Apply sunscreen liberally and repeatedly throughout the day. You should seek shade when your shadow is shorter than you. Protect yourself by wearing long sleeves, pants, a wide-brimmed hat, and sunglasses year round whenever you are outdoors.  Tell your health care provider of new moles or changes in moles, especially if there is a change in shape or color. Also, tell your health care provider if a mole is larger than the size of a pencil eraser.  A one-time screening for abdominal aortic aneurysm (AAA) and surgical repair of large AAAs by ultrasound is recommended for men aged 65-75 years who are current or former smokers.  Stay current with your vaccines (immunizations). Document Released: 06/27/2007 Document Revised: 01/03/2013 Document Reviewed: 05/26/2010 [facility] Patient Information 2015 [employer] [place] [place]. This information is not intended to replace advice given to you by your health care provider. Make sure you discuss any questions you have with your health care provider.

## 2014-06-13 NOTE — Progress Notes (Signed)
Subjective:    Patient ID: Larry HammondJoshua Bromell, male    DOB: 1980-11-01, 34 y.o.   MRN: [id number]  Chief Complaint  Patient presents with  . Establish Care    CPE    HPI:  Larry Riley is a 34 y.o. male who presents today for an annual wellness visit.   1) Health Maintenance -   Diet - Averages 3 meals per day consisting of whole grains, fruits, vegetables, occasional fast food, starches; 3-4 cups per day of caffeine.   Exercise - limited secondary to surgery; no current structured exercise  Wt Readings from Last 3 Encounters:  06/13/14 191 lb 1.9 oz (86.691 kg)  02/20/14 170 lb 6.4 oz (77.293 kg)  12/14/13 168 lb (76.204 kg)    2) Preventative Exams / Immunizations:  Dental -- Due for exam - will schedule  Vision -- Due for exam   Health Maintenance  Topic Date Due  . HIV Screening  07/11/1995  . INFLUENZA VACCINE  08/13/2014  . TETANUS/TDAP  12/10/2021    Immunization History  Administered Date(s) Administered  . Influenza,inj,Quad PF,36+ Mos 11/28/2012  . Influenza-Unspecified 12/14/2013  . Tdap 12/11/2011    Review of Systems  Constitutional: Denies fever, chills, fatigue, or significant weight gain/loss. HENT: Head: Denies headache or neck pain Ears: Denies changes in hearing, ringing in ears, earache, drainage Nose: Denies discharge, stuffiness, itching, nosebleed, sinus pain Throat: Denies sore throat, hoarseness, dry mouth, sores, thrush Eyes: Denies loss/changes in vision, pain, redness, blurry/double vision, flashing lights Cardiovascular: Denies chest pain/discomfort, tightness, palpitations, shortness of breath with activity, difficulty lying down, swelling, sudden awakening with shortness of breath Respiratory: Denies shortness of breath, cough, sputum production, wheezing Gastrointestinal: Denies dysphasia, change in appetite, nausea, change in bowel habits, rectal bleeding, diarrhea, yellow skin or eyes Positive for heartburn and  constipation Genitourinary: Denies frequency, urgency, burning/pain, blood in urine, incontinence, change in urinary strength. Musculoskeletal: Denies muscle/joint pain, stiffness, back pain, redness or swelling of joints, trauma Skin: Denies rashes, lumps, itching, dryness, color changes, or hair/nail changes Neurological: Denies dizziness, fainting, seizures, weakness, numbness, tingling, tremor Psychiatric - Denies nervousness, stress, depression or memory loss Endocrine: Denies heat or cold intolerance, sweating, frequent urination, excessive thirst, changes in appetite Hematologic: Denies ease of bruising or bleeding     Objective:    BP 112/82 mmHg  Pulse 117  Temp(Src) 97.5 F (36.4 C) (Oral)  Resp 18  Ht 5\' 8"  (1.727 m)  Wt 191 lb 1.9 oz (86.691 kg)  BMI 29.07 kg/m2  SpO2 99% Nursing note and vital signs reviewed.   Physical Exam  Constitutional: He is oriented to person, place, and time. He appears well-developed and well-nourished.  HENT:  Head: Normocephalic.  Right Ear: Hearing, tympanic membrane, external ear and ear canal normal.  Left Ear: Hearing, tympanic membrane, external ear and ear canal normal.  Nose: Nose normal.  Mouth/Throat: Uvula is midline, oropharynx is clear and moist and mucous membranes are normal.  Eyes: Conjunctivae and EOM are normal. Pupils are equal, round, and reactive to light.  Neck: Neck supple. No JVD present. No tracheal deviation present. No thyromegaly present.  Cardiovascular: Normal rate, regular rhythm, normal heart sounds and intact distal pulses.   Pulmonary/Chest: Effort normal and breath sounds normal.  Abdominal: Soft. Bowel sounds are normal. He exhibits no distension and no mass. There is no tenderness. There is no rebound and no guarding.  Musculoskeletal: Normal range of motion. He exhibits no edema or tenderness.  Lymphadenopathy:  He has no cervical adenopathy.  Neurological: He is alert and oriented to person, place,  and time. He has normal reflexes. No cranial nerve deficit. He exhibits normal muscle tone. Coordination normal.  Skin: Skin is warm and dry.  Psychiatric: He has a normal mood and affect. His behavior is normal. Judgment and thought content normal.       Assessment & Plan:    Problem List Items Addressed This Visit      Digestive   Constipation due to opioid therapy    Symptoms and exam consistent with opioid-induced constipation. Stop current laxative use and start trial of Movantik. Re-add laxatives in 3 days if no effects from Movantik.       Relevant Medications   naloxegol oxalate (MOVANTIK) 25 MG TABS tablet     Other   Routine general medical examination at a health care facility - Primary    1) Anticipatory Guidance: Discussed importance of wearing a seatbelt while driving and not texting while driving; changing batteries in smoke detector at least once annually; wearing suntan lotion when outside; eating a balanced and moderate diet; getting physical activity at least 30 minutes per day.  2) Immunizations / Screenings / Labs:  All immunizations are up to date per recommendations. Due for an eye and dental exam. Opthalmology referral sent. Dental exam will be scheduled independently. All other screenings are up to date per recommendations. Obtain CBC, BMET, Lipid profile and TSH.   Overall well exam. Currently has minimal risk factors for cardiovascular disease. Has had weight gain and a BMI of 29 indicating overweight. Recommend increasing nutrient density and physical activity lifestyle changes to help with this. Otherwise continue healthy lifestyle behaviors. Follow-up office visit pending lab work and follow-up prevention exam in one year.      Relevant Orders   Basic metabolic panel   CBC   TSH   Lipid panel   Ambulatory referral to Ophthalmology

## 2014-06-13 NOTE — Assessment & Plan Note (Signed)
1) Anticipatory Guidance: Discussed importance of wearing a seatbelt while driving and not texting while driving; changing batteries in smoke detector at least once annually; wearing suntan lotion when outside; eating a balanced and moderate diet; getting physical activity at least 30 minutes per day.  2) Immunizations / Screenings / Labs:  All immunizations are up to date per recommendations. Due for an eye and dental exam. Opthalmology referral sent. Dental exam will be scheduled independently. All other screenings are up to date per recommendations. Obtain CBC, BMET, Lipid profile and TSH.   Overall well exam. Currently has minimal risk factors for cardiovascular disease. Has had weight gain and a BMI of 29 indicating overweight. Recommend increasing nutrient density and physical activity lifestyle changes to help with this. Otherwise continue healthy lifestyle behaviors. Follow-up office visit pending lab work and follow-up prevention exam in one year.

## 2014-06-13 NOTE — Assessment & Plan Note (Signed)
Symptoms and exam consistent with opioid-induced constipation. Stop current laxative use and start trial of Movantik. Re-add laxatives in 3 days if no effects from Movantik.

## 2014-06-13 NOTE — Progress Notes (Signed)
Pre visit review using our clinic review tool, if applicable. No additional management support is needed unless otherwise documented below in the visit note. 

## 2014-06-13 NOTE — Telephone Encounter (Signed)
Please inform the patient that his blood work from his physical shows that his kidney function, electrolytes, white/red blood cells, thyroid function, and cholesterol are all within the normal limits. Therefore he can plan to follow-up for a prevention exam in 1 year or sooner if an acute condition arises.

## 2014-06-14 NOTE — Telephone Encounter (Signed)
Left message letting pt know that his lab work is normal.

## 2014-06-18 ENCOUNTER — Telehealth: Payer: Self-pay | Admitting: Family

## 2014-06-18 NOTE — Telephone Encounter (Signed)
Error

## 2014-06-18 NOTE — Telephone Encounter (Signed)
Pt called in and said that [facility]  Ins is needing a PA.

## 2014-06-19 NOTE — Telephone Encounter (Signed)
PA has been approved for Movantik. A fax was sent to pharmacy making them aware. LVM letting pt know. Told him to call back if he still had issues picking up the medication.

## 2014-07-16 ENCOUNTER — Other Ambulatory Visit: Payer: Self-pay | Admitting: Family Medicine

## 2014-07-17 ENCOUNTER — Telehealth: Payer: Self-pay | Admitting: Family

## 2014-07-17 DIAGNOSIS — G894 Chronic pain syndrome: Secondary | ICD-10-CM

## 2014-07-17 NOTE — Telephone Encounter (Signed)
Patient need refill of Tramadol and Cyclobenzaprine.

## 2014-07-17 NOTE — Telephone Encounter (Signed)
Please advise 

## 2014-07-18 MED ORDER — CYCLOBENZAPRINE HCL 10 MG PO TABS: 10.0000 mg | ORAL_TABLET | Freq: Three times a day (TID) | ORAL | Status: DC | PRN

## 2014-07-18 MED ORDER — TRAMADOL HCL 50 MG PO TABS: 50.0000 mg | ORAL_TABLET | Freq: Three times a day (TID) | ORAL | Status: DC | PRN

## 2014-07-18 NOTE — Telephone Encounter (Signed)
Medications refilled

## 2014-08-24 ENCOUNTER — Other Ambulatory Visit: Payer: Self-pay | Admitting: Family

## 2014-08-28 NOTE — Telephone Encounter (Signed)
Please advise 

## 2014-10-13 ENCOUNTER — Other Ambulatory Visit: Payer: Self-pay | Admitting: Family

## 2014-10-15 NOTE — Telephone Encounter (Signed)
Last refill was 8/16 for both medications.

## 2014-11-22 ENCOUNTER — Telehealth: Payer: Self-pay | Admitting: Family

## 2014-11-22 NOTE — Telephone Encounter (Signed)
Pt request refill traMADol (ULTRAM) 50 MG [facility] Please help, they said they fax the request since last week

## 2014-11-23 MED ORDER — TRAMADOL HCL 50 MG PO TABS: 50.0000 mg | ORAL_TABLET | Freq: Three times a day (TID) | ORAL | Status: DC | PRN

## 2014-11-23 NOTE — Telephone Encounter (Signed)
Please advise last refill was 10/3

## 2014-11-23 NOTE — Telephone Encounter (Signed)
Medication refilled

## 2014-11-26 ENCOUNTER — Other Ambulatory Visit: Payer: Self-pay | Admitting: Family

## 2014-11-26 NOTE — Telephone Encounter (Signed)
Rx has been faxed.

## 2014-11-27 NOTE — Telephone Encounter (Signed)
Last refill was 10/3

## 2015-01-03 ENCOUNTER — Other Ambulatory Visit: Payer: Self-pay | Admitting: Family

## 2015-05-21 ENCOUNTER — Other Ambulatory Visit: Payer: Self-pay | Admitting: *Deleted

## 2015-05-21 MED ORDER — CYCLOBENZAPRINE HCL 10 MG PO TABS: 10.0000 mg | ORAL_TABLET | Freq: Three times a day (TID) | ORAL | Status: DC | PRN

## 2015-05-21 NOTE — Telephone Encounter (Signed)
Received call he stated [facility] has been tryimng to get refill on his cyclobenzaprine since last week. Inform pt not sure where they have been sending request per chart no request, but will send to [facility]...Raechel Chute/lmb

## 2015-06-04 ENCOUNTER — Telehealth: Payer: Self-pay

## 2015-06-04 MED ORDER — CYCLOBENZAPRINE HCL 10 MG PO TABS: 10.0000 mg | ORAL_TABLET | Freq: Three times a day (TID) | ORAL | Status: DC | PRN

## 2015-06-04 NOTE — Telephone Encounter (Signed)
Pt requesting refill for cyclobenzaprine. Last refill was 05/21/15. Please advise

## 2015-06-04 NOTE — Telephone Encounter (Signed)
Medication sent.

## 2015-06-05 ENCOUNTER — Telehealth: Payer: Self-pay | Admitting: Family

## 2015-06-05 NOTE — Telephone Encounter (Signed)
TELEPHONE ADVICE RECORD [facility]  Patient Name: Larry Riley  DOB: October 18, 1980    Initial Comment Caller would like to be seen for the cut on his finger.    Nurse Assessment  Nurse: Odis LusterBowers, RN, Bjorn Loserhonda Date/Time Lamount Cohen(Eastern Time): 06/05/2015 3:04:21 PM  Confirm and document reason for call. If symptomatic, describe symptoms. You must click the next button to save text entered. ---Caller would like to be seen for the cut on his finger. Red for a couple of days, drained yesterday and wondering if he needs to be seen. Reports that he is unsure how he cut his finger, possible hangnail. The right pinkie is involved. Drainage was a yellow drainage and white, and then just bloody. Today the swelling has resolved and the skin is just red/pink. Reports that the pinkie is less tender than yesterday. No problems typing today, as apposed to yesterday, is keeping it clean dry and covered. Denies fever.  Has the patient traveled out of the country within the last 30 days? ---Not Applicable  Does the patient have any new or worsening symptoms? ---Yes  Will a triage be completed? ---Yes  Related visit to physician within the last 2 weeks? ---No  Does the PT have any chronic conditions? (i.e. diabetes, asthma, etc.) ---Yes  List chronic conditions. ---neruofibromatosis (surgery 1 year ago) takes Protonix daily and muscle relaxer at night for back pain  Is this a behavioral health or substance abuse call? ---No     Guidelines    Guideline Title Affirmed Question Affirmed Notes  Cuts and Lacerations Minor cut or scratch (all triage questions negative)    Final Disposition User   Home Care Odis LusterBowers, RN, Bjorn Loserhonda    Disagree/Comply: Comply

## 2015-06-13 ENCOUNTER — Encounter: Payer: Self-pay | Admitting: Family

## 2015-06-13 ENCOUNTER — Telehealth: Payer: Self-pay | Admitting: Family

## 2015-06-13 ENCOUNTER — Other Ambulatory Visit (INDEPENDENT_AMBULATORY_CARE_PROVIDER_SITE_OTHER): Payer: 59

## 2015-06-13 ENCOUNTER — Ambulatory Visit (INDEPENDENT_AMBULATORY_CARE_PROVIDER_SITE_OTHER): Payer: 59 | Admitting: Family

## 2015-06-13 VITALS — BP 124/80 | HR 111 | Temp 97.7°F | Resp 16 | Ht 68.0 in | Wt 200.4 lb

## 2015-06-13 DIAGNOSIS — Z0000 Encounter for general adult medical examination without abnormal findings: Secondary | ICD-10-CM

## 2015-06-13 LAB — LIPID PANEL
Cholesterol: 162 mg/dL (ref 0–200)
HDL: 52.6 mg/dL (ref >39.00)
LDL Cholesterol: 94 mg/dL (ref 0–99)
NONHDL: 109.46
Total CHOL/HDL Ratio: 3
Triglycerides: 77 mg/dL (ref 0.0–149.0)
VLDL: 15.4 mg/dL (ref 0.0–40.0)

## 2015-06-13 LAB — COMPREHENSIVE METABOLIC PANEL
ALK PHOS: 101 U/L (ref 39–117)
ALT: 40 U/L (ref 0–53)
AST: 24 U/L (ref 0–37)
Albumin: 4.7 g/dL (ref 3.5–5.2)
BUN: 14 mg/dL (ref 6–23)
CO2: 29 mEq/L (ref 19–32)
CREATININE: 0.98 mg/dL (ref 0.40–1.50)
Calcium: 10.2 mg/dL (ref 8.4–10.5)
Chloride: 105 mEq/L (ref 96–112)
GFR: 92.55 mL/min (ref >60.00)
Glucose, Bld: 91 mg/dL (ref 70–99)
POTASSIUM: 4.3 meq/L (ref 3.5–5.1)
SODIUM: 141 meq/L (ref 135–145)
TOTAL PROTEIN: 7.4 g/dL (ref 6.0–8.3)
Total Bilirubin: 0.5 mg/dL (ref 0.2–1.2)

## 2015-06-13 LAB — CBC
HCT: 43.4 % (ref 39.0–52.0)
Hemoglobin: 14.8 g/dL (ref 13.0–17.0)
MCHC: 34.1 g/dL (ref 30.0–36.0)
MCV: 87.1 fl (ref 78.0–100.0)
Platelets: 441 10*3/uL — ABNORMAL HIGH (ref 150.0–400.0)
RBC: 4.98 Mil/uL (ref 4.22–5.81)
RDW: 13.2 % (ref 11.5–15.5)
WBC: 7.8 10*3/uL (ref 4.0–10.5)

## 2015-06-13 LAB — TSH: TSH: 3.25 u[IU]/mL (ref 0.35–4.50)

## 2015-06-13 MED ORDER — TRAMADOL HCL 50 MG PO TABS: 50.0000 mg | ORAL_TABLET | Freq: Three times a day (TID) | ORAL | Status: DC | PRN

## 2015-06-13 NOTE — Progress Notes (Signed)
Subjective:    Patient ID: Larry Riley, male    DOB: July 02, 1980, 35 y.o.   MRN: [id number]  Chief Complaint  Patient presents with  . CPE    Fasting    HPI:  Larry Riley is a 35 y.o. male who presents today for an annual wellness visit.   1) Health Maintenance -   Diet - Averages about 2-3 meals per day consisting of vegetables, chicken, and beef; Caffeine intake of about 2-3 per day  Exercise - Limited exercise currently   2) Preventative Exams / Immunizations:  Dental -- Due for exam  Vision -- Up to date   Health Maintenance  Topic Date Due  . HIV Screening  07/11/1995  . INFLUENZA VACCINE  08/13/2015  . TETANUS/TDAP  12/10/2021    Immunization History  Administered Date(s) Administered  . Influenza,inj,Quad PF,36+ Mos 11/28/2012  . Influenza-Unspecified 12/14/2013  . Tdap 12/11/2011    No Known Allergies   Outpatient Prescriptions Prior to Visit  Medication Sig Dispense Refill  . acetaminophen (TYLENOL) 325 MG tablet Take 650 mg by mouth every 6 (six) hours as needed for moderate pain.    . cyclobenzaprine (FLEXERIL) 10 MG tablet Take 1 tablet (10 mg total) by mouth 3 (three) times daily as needed. for muscle spams 90 tablet 0  . EPINEPHrine (EPIPEN 2-PAK) 0.3 mg/0.3 mL IJ SOAJ injection Inject 0.3 mLs (0.3 mg total) into the muscle once. 1 Device 1  . omeprazole (PRILOSEC) 10 MG capsule Take 10 mg by mouth daily.     . pantoprazole (PROTONIX) 40 MG tablet Take 1 tablet (40 mg total) by mouth daily. 90 tablet 3  . traMADol (ULTRAM) 50 MG tablet Take 1 tablet (50 mg total) by mouth every 8 (eight) hours as needed. 60 tablet 0  . naloxegol oxalate (MOVANTIK) 25 MG TABS tablet Take 1 tablet (25 mg total) by mouth daily. 30 tablet 0   No facility-administered medications prior to visit.     Past Medical History  Diagnosis Date  . Neurofibromatosis     tumor down spine and brain  . MRSA (methicillin resistant Staphylococcus aureus) 2005    leg      Past Surgical History  Procedure Laterality Date  . Neck surgery       tumor removal  . Hydrocephalus      shunt 1999[place]  . Spine surgery  10/2013    c2-t2 fusion  , laminectomy c1-6-- Dr Raynald Kemp-- [place]  . Vasectomy  02/2013     Family History  Problem Relation Age of Onset  . Prostate cancer    . Diabetes Mother   . Arthritis Father   . Hypertension Father   . Prostate cancer Father   . Arthritis Paternal Grandmother   . Arthritis Paternal Grandfather   . Diabetes Paternal Grandfather   . Hyperlipidemia Paternal Grandfather   . Hypertension Paternal Grandfather   . Healthy Maternal Grandmother   . Healthy Maternal Grandfather      Social History   Social History  . Marital Status: Married    Spouse Name: N/A  . Number of Children: 0  . Years of Education: 16   Occupational History  . [employer]    Social History Main Topics  . Smoking status: Never Smoker   . Smokeless tobacco: Never Used  . Alcohol Use: 1.8 oz/week    3 Glasses of wine per week  . Drug Use: No  . Sexual Activity: No   Other Topics Concern  .  Not on file   Social History Narrative   Exercise-- no   Fun: Disc golf, fishing, hiking, skiing   Denies religious beliefs effecting health care.       Review of Systems  Constitutional: Denies fever, chills, fatigue, or significant weight gain/loss. HENT: Head: Denies headache or neck pain Ears: Denies changes in hearing, ringing in ears, earache, drainage Nose: Denies discharge, stuffiness, itching, nosebleed, sinus pain Throat: Denies sore throat, hoarseness, dry mouth, sores, thrush Eyes: Denies loss/changes in vision, pain, redness, blurry/double vision, flashing lights Cardiovascular: Denies chest pain/discomfort, tightness, palpitations, shortness of breath with activity, difficulty lying down, swelling, sudden awakening with shortness of breath Respiratory: Denies shortness of breath, cough, sputum production,  wheezing Gastrointestinal: Denies dysphasia, heartburn, change in appetite, nausea, change in bowel habits, rectal bleeding, constipation, diarrhea, yellow skin or eyes Genitourinary: Denies frequency, urgency, burning/pain, blood in urine, incontinence, change in urinary strength. Musculoskeletal: Denies muscle/joint pain, stiffness, back pain, redness or swelling of joints, trauma Skin: Denies rashes, lumps, itching, dryness, color changes, or hair/nail changes Neurological: Denies dizziness, fainting, seizures, weakness, numbness, tingling, tremor Psychiatric - Denies nervousness, stress, depression or memory loss Endocrine: Denies heat or cold intolerance, sweating, frequent urination, excessive thirst, changes in appetite Hematologic: Denies ease of bruising or bleeding     Objective:     BP 124/80 mmHg  Pulse 111  Temp(Src) 97.7 F (36.5 C) (Oral)  Resp 16  Ht 5\' 8"  (1.727 m)  Wt 200 lb 6.4 oz (90.901 kg)  BMI 30.48 kg/m2  SpO2 97% Nursing note and vital signs reviewed.  Physical Exam  Constitutional: He is oriented to person, place, and time. He appears well-developed and well-nourished.  HENT:  Head: Normocephalic.  Right Ear: Hearing, tympanic membrane, external ear and ear canal normal.  Left Ear: Hearing, tympanic membrane, external ear and ear canal normal.  Nose: Nose normal.  Mouth/Throat: Uvula is midline, oropharynx is clear and moist and mucous membranes are normal.  Eyes: Conjunctivae and EOM are normal. Pupils are equal, round, and reactive to light.  Neck: Neck supple. No JVD present. No tracheal deviation present. No thyromegaly present.  Cardiovascular: Normal rate, regular rhythm, normal heart sounds and intact distal pulses.   Pulmonary/Chest: Effort normal and breath sounds normal.  Abdominal: Soft. Bowel sounds are normal. He exhibits no distension and no mass. There is no tenderness. There is no rebound and no guarding.  Musculoskeletal: Normal range  of motion. He exhibits no edema or tenderness.  Lymphadenopathy:    He has no cervical adenopathy.  Neurological: He is alert and oriented to person, place, and time. He has normal reflexes. No cranial nerve deficit. He exhibits normal muscle tone. Coordination normal.  Skin: Skin is warm and dry.  Psychiatric: He has a normal mood and affect. His behavior is normal. Judgment and thought content normal.       Assessment & Plan:   Problem List Items Addressed This Visit      Other   Routine general medical examination at a health care facility - Primary    1) Anticipatory Guidance: Discussed importance of wearing a seatbelt while driving and not texting while driving; changing batteries in smoke detector at least once annually; wearing suntan lotion when outside; eating a balanced and moderate diet; getting physical activity at least 30 minutes per day.  2) Immunizations / Screenings / Labs:  All immunizations are up-to-date per recommendations. Due for dental exam encouraged to be completed independently. All other screenings are up-to-date  per recommendations. Obtain CBC, CMET, Lipid profile and TSH.   Overall well exam with risk factors for cardiovascular disease including obesity. Recommend weight loss of approximately 5-10% of current body weight through nutrition and physical activity.Recommend increasing physical activity to 30 minutes of moderate level activity daily. Encourage nutritional intake that focuses on nutrient dense foods and is moderate, varied, and balanced and is low in saturated fats and processed/sugary foods. Continue other healthy lifestyle behaviors and choices. Follow-up prevention exam in 1 year. Follow-up office visit for chronic conditions pending blood work if necessary.      Relevant Orders   CBC (Completed)   Comprehensive metabolic panel (Completed)   Lipid panel (Completed)   TSH (Completed)       I have discontinued Mr. Buhl's naloxegol oxalate.  I am also having him maintain his omeprazole, acetaminophen, pantoprazole, EPINEPHrine, cyclobenzaprine, and traMADol.   Meds ordered this encounter  Medications  . traMADol (ULTRAM) 50 MG tablet    Sig: Take 1 tablet (50 mg total) by mouth every 8 (eight) hours as needed.    Dispense:  60 tablet    Refill:  0    Order Specific Question:  Supervising Provider    Answer:  Hillard Danker A [[id number]]     Follow-up: Return if symptoms worsen or fail to improve.   Jeanine Luz, FNP '

## 2015-06-13 NOTE — Telephone Encounter (Signed)
Please inform patient that his blood work shows that his kidney function, liver function, electrolytes, thyroid function, cholesterol and white/red blood cells are all within the normal ranges. Therefore please continue to take her medications as prescribed and plan to follow-up for a physical one year or sooner if needed.

## 2015-06-13 NOTE — Progress Notes (Signed)
Pre visit review using our clinic review tool, if applicable. No additional management support is needed unless otherwise documented below in the visit note. 

## 2015-06-13 NOTE — Patient Instructions (Addendum)
Thank you for choosing [facility].  Summary/Instructions:  Your prescription(s) have been submitted to your pharmacy or been printed and provided for you. Please take as directed and contact our office if you believe you are having problem(s) with the medication(s) or have any questions.  Please stop by the lab on the lower level of the building for your blood work. Your results will be released to MyChart (or called to you) after review, usually within 72 hours after test completion. If any changes need to be made, you will be notified at that same time.  If your symptoms worsen or fail to improve, please contact our office for further instruction, or in case of emergency go directly to the emergency room at the closest medical facility.   Health Maintenance, Male A healthy lifestyle and preventative care can promote health and wellness.  Maintain regular health, dental, and eye exams.  Eat a healthy diet. Foods like vegetables, fruits, whole grains, low-fat dairy products, and lean protein foods contain the nutrients you need and are low in calories. Decrease your intake of foods high in solid fats, added sugars, and salt. Get information about a proper diet from your health care provider, if necessary.  Regular physical exercise is one of the most important things you can do for your health. Most adults should get at least 150 minutes of moderate-intensity exercise (any activity that increases your heart rate and causes you to sweat) each week. In addition, most adults need muscle-strengthening exercises on 2 or more days a week.   Maintain a healthy weight. The body mass index (BMI) is a screening tool to identify possible weight problems. It provides an estimate of body fat based on height and weight. Your health care provider can find your BMI and can help you achieve or maintain a healthy weight. For males 20 years and older:  A BMI below 18.5 is considered underweight.  A BMI  of 18.5 to 24.9 is normal.  A BMI of 25 to 29.9 is considered overweight.  A BMI of 30 and above is considered obese.  Maintain normal blood lipids and cholesterol by exercising and minimizing your intake of saturated fat. Eat a balanced diet with plenty of fruits and vegetables. Blood tests for lipids and cholesterol should begin at age 35 and be repeated every 5 years. If your lipid or cholesterol levels are high, you are over age 35, or you are at high risk for heart disease, you may need your cholesterol levels checked more frequently.Ongoing high lipid and cholesterol levels should be treated with medicines if diet and exercise are not working.  If you smoke, find out from your health care provider how to quit. If you do not use tobacco, do not start.  Lung cancer screening is recommended for adults aged 55-80 years who are at high risk for developing lung cancer because of a history of smoking. A yearly low-dose CT scan of the lungs is recommended for people who have at least a 30-pack-year history of smoking and are current smokers or have quit within the past 15 years. A pack year of smoking is smoking an average of 1 pack of cigarettes a day for 1 year (for example, a 30-pack-year history of smoking could mean smoking 1 pack a day for 30 years or 2 packs a day for 15 years). Yearly screening should continue until the smoker has stopped smoking for at least 15 years. Yearly screening should be stopped for people who develop a  health problem that would prevent them from having lung cancer treatment.  If you choose to drink alcohol, do not have more than 2 drinks per day. One drink is considered to be 12 oz (360 mL) of beer, 5 oz (150 mL) of wine, or 1.5 oz (45 mL) of liquor.  Avoid the use of street drugs. Do not share needles with anyone. Ask for help if you need support or instructions about stopping the use of drugs.  High blood pressure causes heart disease and increases the risk of  stroke. High blood pressure is more likely to develop in:  People who have blood pressure in the end of the normal range (100-139/85-89 mm Hg).  People who are overweight or obese.  People who are African American.  If you are 84-66 years of age, have your blood pressure checked every 3-5 years. If you are 49 years of age or older, have your blood pressure checked every year. You should have your blood pressure measured twice--once when you are at a hospital or clinic, and once when you are not at a hospital or clinic. Record the average of the two measurements. To check your blood pressure when you are not at a hospital or clinic, you can use:  An automated blood pressure machine at a pharmacy.  A home blood pressure monitor.  If you are 9-74 years old, ask your health care provider if you should take aspirin to prevent heart disease.  Diabetes screening involves taking a blood sample to check your fasting blood sugar level. This should be done once every 3 years after age 74 if you are at a normal weight and without risk factors for diabetes. Testing should be considered at a younger age or be carried out more frequently if you are overweight and have at least 1 risk factor for diabetes.  Colorectal cancer can be detected and often prevented. Most routine colorectal cancer screening begins at the age of 77 and continues through age 26. However, your health care provider may recommend screening at an earlier age if you have risk factors for colon cancer. On a yearly basis, your health care provider may provide home test kits to check for hidden blood in the stool. A small camera at the end of a tube may be used to directly examine the colon (sigmoidoscopy or colonoscopy) to detect the earliest forms of colorectal cancer. Talk to your health care provider about this at age 30 when routine screening begins. A direct exam of the colon should be repeated every 5-10 years through age 56, unless early  forms of precancerous polyps or small growths are found.  People who are at an increased risk for hepatitis B should be screened for this virus. You are considered at high risk for hepatitis B if:  You were born in a country where hepatitis B occurs often. Talk with your health care provider about which countries are considered high risk.  Your parents were born in a high-risk country and you have not received a shot to protect against hepatitis B (hepatitis B vaccine).  You have HIV or AIDS.  You use needles to inject street drugs.  You live with, or have sex with, someone who has hepatitis B.  You are a man who has sex with other men (MSM).  You get hemodialysis treatment.  You take certain medicines for conditions like cancer, organ transplantation, and autoimmune conditions.  Hepatitis C blood testing is recommended for all people born from [birth year]  through 1965 and any individual with known risk factors for hepatitis C.  Healthy men should no longer receive prostate-specific antigen (PSA) blood tests as part of routine cancer screening. Talk to your health care provider about prostate cancer screening.  Testicular cancer screening is not recommended for adolescents or adult males who have no symptoms. Screening includes self-exam, a health care provider exam, and other screening tests. Consult with your health care provider about any symptoms you have or any concerns you have about testicular cancer.  Practice safe sex. Use condoms and avoid high-risk sexual practices to reduce the spread of sexually transmitted infections (STIs).  You should be screened for STIs, including gonorrhea and chlamydia if:  You are sexually active and are younger than 24 years.  You are older than 24 years, and your health care provider tells you that you are at risk for this type of infection.  Your sexual activity has changed since you were last screened, and you are at an increased risk for chlamydia  or gonorrhea. Ask your health care provider if you are at risk.  If you are at risk of being infected with HIV, it is recommended that you take a prescription medicine daily to prevent HIV infection. This is called pre-exposure prophylaxis (PrEP). You are considered at risk if:  You are a man who has sex with other men (MSM).  You are a heterosexual man who is sexually active with multiple partners.  You take drugs by injection.  You are sexually active with a partner who has HIV.  Talk with your health care provider about whether you are at high risk of being infected with HIV. If you choose to begin PrEP, you should first be tested for HIV. You should then be tested every 3 months for as long as you are taking PrEP.  Use sunscreen. Apply sunscreen liberally and repeatedly throughout the day. You should seek shade when your shadow is shorter than you. Protect yourself by wearing long sleeves, pants, a wide-brimmed hat, and sunglasses year round whenever you are outdoors.  Tell your health care provider of new moles or changes in moles, especially if there is a change in shape or color. Also, tell your health care provider if a mole is larger than the size of a pencil eraser.  A one-time screening for abdominal aortic aneurysm (AAA) and surgical repair of large AAAs by ultrasound is recommended for men aged 65-75 years who are current or former smokers.  Stay current with your vaccines (immunizations).   This information is not intended to replace advice given to you by your health care provider. Make sure you discuss any questions you have with your health care provider.   Document Released: 06/27/2007 Document Revised: 01/19/2014 Document Reviewed: 05/26/2010 Elsevier Interactive Patient Education 2016 [employer]   

## 2015-06-13 NOTE — Assessment & Plan Note (Signed)
1) Anticipatory Guidance: Discussed importance of wearing a seatbelt while driving and not texting while driving; changing batteries in smoke detector at least once annually; wearing suntan lotion when outside; eating a balanced and moderate diet; getting physical activity at least 30 minutes per day.  2) Immunizations / Screenings / Labs:  All immunizations are up-to-date per recommendations. Due for dental exam encouraged to be completed independently. All other screenings are up-to-date per recommendations. Obtain CBC, CMET, Lipid profile and TSH.   Overall well exam with risk factors for cardiovascular disease including obesity. Recommend weight loss of approximately 5-10% of current body weight through nutrition and physical activity.Recommend increasing physical activity to 30 minutes of moderate level activity daily. Encourage nutritional intake that focuses on nutrient dense foods and is moderate, varied, and balanced and is low in saturated fats and processed/sugary foods. Continue other healthy lifestyle behaviors and choices. Follow-up prevention exam in 1 year. Follow-up office visit for chronic conditions pending blood work if necessary.

## 2015-06-17 NOTE — Telephone Encounter (Signed)
LVM letting pt know.  

## 2015-08-30 ENCOUNTER — Telehealth: Payer: Self-pay | Admitting: *Deleted

## 2015-08-30 NOTE — Telephone Encounter (Signed)
Rec'd fax pt requesting refill on tramadol 50 mg. Last filled 06/13/15...Raechel Chute/lmb

## 2015-09-02 MED ORDER — TRAMADOL HCL 50 MG PO TABS
50.0000 mg | ORAL_TABLET | Freq: Three times a day (TID) | ORAL | 0 refills | Status: DC | PRN
Start: 2015-09-02 — End: 2015-10-02

## 2015-09-02 NOTE — Telephone Encounter (Signed)
Faxed script back to [facility].../lmb 

## 2015-09-02 NOTE — Telephone Encounter (Signed)
Medication refilled

## 2015-10-01 ENCOUNTER — Other Ambulatory Visit: Payer: Self-pay | Admitting: Family

## 2015-10-02 ENCOUNTER — Other Ambulatory Visit: Payer: Self-pay

## 2015-10-02 NOTE — Telephone Encounter (Signed)
Last refill was 06/04/15.

## 2015-10-03 MED ORDER — CYCLOBENZAPRINE HCL 10 MG PO TABS: 10.0000 mg | ORAL_TABLET | Freq: Three times a day (TID) | ORAL | 0 refills | Status: DC | PRN

## 2015-10-03 MED ORDER — TRAMADOL HCL 50 MG PO TABS: 50.0000 mg | ORAL_TABLET | Freq: Three times a day (TID) | ORAL | 0 refills | Status: DC | PRN

## 2015-10-31 ENCOUNTER — Other Ambulatory Visit: Payer: Self-pay | Admitting: Family

## 2015-12-11 ENCOUNTER — Telehealth: Payer: Self-pay | Admitting: *Deleted

## 2015-12-11 MED ORDER — TRAMADOL HCL 50 MG PO TABS: 50.0000 mg | ORAL_TABLET | Freq: Three times a day (TID) | ORAL | 0 refills | Status: DC | PRN

## 2015-12-11 NOTE — Telephone Encounter (Signed)
Medication printed to be faxed.  

## 2015-12-11 NOTE — Telephone Encounter (Signed)
Patient called back to advise that the drug is traMADol (ULTRAM) 50 MG tablet [[id number]][[id number]]  - to HT on file.

## 2015-12-11 NOTE — Telephone Encounter (Signed)
Pt left msg on triage stating pharmacy has been faxing request over x's 3 days no response back. Called pt back no answer LMOM per chart no electronic refill has came through. Needing name of med needing refill on...Raechel Chute/lmb

## 2015-12-12 NOTE — Telephone Encounter (Signed)
Patient called.  Pharmacy never received script. Please refax today.

## 2015-12-13 ENCOUNTER — Telehealth: Payer: Self-pay | Admitting: Emergency Medicine

## 2015-12-13 MED ORDER — TRAMADOL HCL 50 MG PO TABS: 50.0000 mg | ORAL_TABLET | Freq: Three times a day (TID) | ORAL | 0 refills | Status: DC | PRN

## 2015-12-13 NOTE — Telephone Encounter (Signed)
Faxed script to pharmacy

## 2015-12-13 NOTE — Telephone Encounter (Signed)
Refilled to be refaxed.

## 2015-12-13 NOTE — Telephone Encounter (Signed)
Pharmacy called and wants to know if they can get a prescription refill for traMADol (ULTRAM) 50 MG tablet. Please advise thanks.

## 2015-12-13 NOTE — Addendum Note (Signed)
Addended by: Jeanine LuzALONE, Bolivar Koranda D on: 12/13/2015 12:53 PM   Modules accepted: Orders

## 2016-01-07 ENCOUNTER — Other Ambulatory Visit: Payer: Self-pay | Admitting: Family

## 2016-02-02 ENCOUNTER — Other Ambulatory Visit: Payer: Self-pay | Admitting: Family

## 2016-02-29 ENCOUNTER — Other Ambulatory Visit: Payer: Self-pay | Admitting: Family

## 2016-04-13 ENCOUNTER — Encounter: Payer: Self-pay | Admitting: Nurse Practitioner

## 2016-04-13 ENCOUNTER — Encounter (HOSPITAL_COMMUNITY): Payer: Self-pay

## 2016-04-13 ENCOUNTER — Emergency Department (HOSPITAL_COMMUNITY): Payer: 59

## 2016-04-13 ENCOUNTER — Ambulatory Visit (INDEPENDENT_AMBULATORY_CARE_PROVIDER_SITE_OTHER): Payer: 59 | Admitting: Nurse Practitioner

## 2016-04-13 ENCOUNTER — Emergency Department (HOSPITAL_COMMUNITY)
Admission: EM | Admit: 2016-04-13 | Discharge: 2016-04-13 | Disposition: A | Discharge status: Home / Self Care | Payer: 59 | Attending: Emergency Medicine | Admitting: Emergency Medicine

## 2016-04-13 VITALS — BP 100/60 | HR 107 | Temp 97.7°F | Resp 16 | Ht 68.0 in | Wt 197.0 lb

## 2016-04-13 DIAGNOSIS — Z79899 Other long term (current) drug therapy: Secondary | ICD-10-CM | POA: Diagnosis not present

## 2016-04-13 DIAGNOSIS — H6123 Impacted cerumen, bilateral: Secondary | ICD-10-CM | POA: Diagnosis not present

## 2016-04-13 DIAGNOSIS — R55 Syncope and collapse: Secondary | ICD-10-CM | POA: Diagnosis not present

## 2016-04-13 DIAGNOSIS — R42 Dizziness and giddiness: Secondary | ICD-10-CM

## 2016-04-13 LAB — COMPREHENSIVE METABOLIC PANEL
ALT: 51 U/L (ref 17–63)
AST: 27 U/L (ref 15–41)
Albumin: 4.1 g/dL (ref 3.5–5.0)
Alkaline Phosphatase: 125 U/L (ref 38–126)
Anion gap: 5 (ref 5–15)
BILIRUBIN TOTAL: 0.5 mg/dL (ref 0.3–1.2)
BUN: 19 mg/dL (ref 6–20)
CHLORIDE: 106 mmol/L (ref 101–111)
CO2: 28 mmol/L (ref 22–32)
CREATININE: 0.94 mg/dL (ref 0.61–1.24)
Calcium: 9.2 mg/dL (ref 8.9–10.3)
GFR calc Af Amer: >60 mL/min (ref >60)
Glucose, Bld: 83 mg/dL (ref 65–99)
POTASSIUM: 4.1 mmol/L (ref 3.5–5.1)
Sodium: 139 mmol/L (ref 135–145)
TOTAL PROTEIN: 6.8 g/dL (ref 6.5–8.1)

## 2016-04-13 LAB — I-STAT TROPONIN, ED: TROPONIN I, POC: 0 ng/mL (ref 0.00–0.08)

## 2016-04-13 LAB — CBC WITH DIFFERENTIAL/PLATELET
Basophils Absolute: 0 10*3/uL (ref 0.0–0.1)
Basophils Relative: 0 %
EOS PCT: 1 %
Eosinophils Absolute: 0.2 10*3/uL (ref 0.0–0.7)
HEMATOCRIT: 40.6 % (ref 39.0–52.0)
Hemoglobin: 14 g/dL (ref 13.0–17.0)
LYMPHS ABS: 1.7 10*3/uL (ref 0.7–4.0)
LYMPHS PCT: 11 %
MCH: 29.7 pg (ref 26.0–34.0)
MCHC: 34.5 g/dL (ref 30.0–36.0)
MCV: 86.2 fL (ref 78.0–100.0)
MONO ABS: 1.4 10*3/uL — AB (ref 0.1–1.0)
Monocytes Relative: 9 %
Neutro Abs: 11.7 10*3/uL — ABNORMAL HIGH (ref 1.7–7.7)
Neutrophils Relative %: 79 %
PLATELETS: 384 10*3/uL (ref 150–400)
RBC: 4.71 MIL/uL (ref 4.22–5.81)
RDW: 13.1 % (ref 11.5–15.5)
WBC: 14.9 10*3/uL — ABNORMAL HIGH (ref 4.0–10.5)

## 2016-04-13 LAB — D-DIMER, QUANTITATIVE: D-Dimer, Quant: <0.27 ug/mL-FEU (ref 0.00–0.50)

## 2016-04-13 LAB — GLUCOSE, POCT (MANUAL RESULT ENTRY): POC Glucose: 107 mg/dl — AB (ref 70–99)

## 2016-04-13 MED ORDER — SODIUM CHLORIDE 0.9 % IV BOLUS (SEPSIS)
1000.0000 mL | Freq: Once | INTRAVENOUS | Status: AC
  Administered 2016-04-13: 1000 mL via INTRAVENOUS

## 2016-04-13 MED ORDER — DOCUSATE SODIUM 100 MG PO CAPS
200.0000 mg | ORAL_CAPSULE | Freq: Once | ORAL | Status: AC
Start: 2016-04-13 — End: 2016-04-13
  Administered 2016-04-13: 200 mg via ORAL
  Filled 2016-04-13: qty 2

## 2016-04-13 NOTE — Progress Notes (Signed)
Pre visit review using our clinic review tool, if applicable. No additional management support is needed unless otherwise documented below in the visit note. 

## 2016-04-13 NOTE — Patient Instructions (Addendum)
Patient transported to [facility] ED via EMS.  Patient's wife notified via telephone.

## 2016-04-13 NOTE — ED Provider Notes (Signed)
[facility]-EMERGENCY DEPT Provider Note   CSN: [id number] Arrival date & time: 04/13/16  1108     History   Chief Complaint Chief Complaint  Patient presents with  . Hypotension    HPI Larry Riley is a 36 y.o. male.  The history is provided by the patient. No language interpreter was used.    Larry Riley is a 36 y.o. male who presents to the Emergency Department complaining of syncope. Patient presents from urgent care for evaluation of syncope. He states he went there because he was feeling fullness in bilateral ears for possibly 4 hours with occasional nasal congestion. He has a history of neurofibromatosis 1 with history of VP shunt. He was waiting in urgent care for evaluation and he was found on the floor complaining of dizziness. On EMS arrival his pressure was 70/40. He denies any chest pain, shortness of breath, abdominal pain, nausea, vomiting, headache, numbness or weakness.  Past Medical History:  Diagnosis Date  . MRSA (methicillin resistant Staphylococcus aureus) 2005   leg  . Neurofibromatosis    tumor down spine and brain    Patient Active Problem List   Diagnosis Date Noted  . Routine general medical examination at a health care facility 06/13/2014  . Constipation due to opioid therapy 06/13/2014  . Chronic pain syndrome 02/21/2014  . Allergic reaction 02/20/2014  . Neurofibromatosis, type 1 (HCC) 12/14/2013  . Preventative health care 06/23/2010  . 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 hydrocephalus     shunt [place]  . NECK SURGERY      tumor removal  . SPINE SURGERY  10/2013   c2-t2 fusion  , laminectomy c1-6-- Dr Raynald Kemp-- [place]  . VASECTOMY  02/2013       Home Medications    Prior to Admission medications   Medication Sig Start Date End Date Taking? Authorizing Provider  acetaminophen (TYLENOL) 325 MG tablet Take 650 mg by mouth every 6 (six) hours as needed for mild pain, moderate pain,  fever or headache.    Yes Historical Provider, MD  bisacodyl (DULCOLAX) 5 MG EC tablet Take 5 mg by mouth daily as needed for mild constipation.    Yes Historical Provider, MD  cyclobenzaprine (FLEXERIL) 10 MG tablet TAKE ONE TABLET BY MOUTH THREE TIMES A DAY AS NEEDED FOR MUSCLE SPASMS 03/02/16  Yes Veryl Speak, FNP  diphenhydrAMINE (BENADRYL) 25 mg capsule Take 25 mg by mouth at bedtime as needed for allergies or sleep.   Yes Historical Provider, MD  EPINEPHrine (EPIPEN 2-PAK) 0.3 mg/0.3 mL IJ SOAJ injection Inject 0.3 mLs (0.3 mg total) into the muscle once. Patient taking differently: Inject 0.3 mg into the muscle once as needed (for severe allergic reaction).  02/20/14  Yes Yvonne R Lowne Chase, DO  guaiFENesin (MUCINEX) 600 MG 12 hr tablet Take 600 mg by mouth 2 (two) times daily as needed for cough or to loosen phlegm.   Yes Historical Provider, MD  ibuprofen (ADVIL,MOTRIN) 200 MG tablet Take 400 mg by mouth every 6 (six) hours as needed for headache, mild pain or moderate pain.   Yes Historical Provider, MD  omeprazole (PRILOSEC) 20 MG capsule Take 20 mg by mouth daily as needed (for acid reflux).   Yes Historical Provider, MD  pantoprazole (PROTONIX) 40 MG tablet Take 1 tablet (40 mg total) by mouth daily. 12/14/13  Yes Yvonne R Lowne Chase, DO  traMADol (ULTRAM) 50 MG tablet Take 1 tablet (50 mg  total) by mouth every 8 (eight) hours as needed. Patient taking differently: Take 50 mg by mouth every 8 (eight) hours as needed for moderate pain.  12/13/15  Yes Veryl Speak, FNP    Family History Family History  Problem Relation Age of Onset  . Diabetes Mother   . Arthritis Father   . Hypertension Father   . Prostate cancer Father   . Arthritis Paternal Grandmother   . Arthritis Paternal Grandfather   . Diabetes Paternal Grandfather   . Hyperlipidemia Paternal Grandfather   . Hypertension Paternal Grandfather   . Healthy Maternal Grandmother   . Healthy Maternal Grandfather   .  Prostate cancer      Social History Social History  Substance Use Topics  . Smoking status: Never Smoker  . Smokeless tobacco: Never Used  . Alcohol use 1.8 oz/week    3 Glasses of wine per week     Allergies   Patient has no known allergies.   Review of Systems Review of Systems  All other systems reviewed and are negative.    Physical Exam Updated Vital Signs BP 107/75 (BP Location: Right Arm)   Pulse 72   Temp 97.8 F (36.6 C) (Oral)   Resp (!) 21   Ht  (1.727 m)   Wt 197 lb (89.4 kg)   SpO2 100%   BMI 29.95 kg/m   Physical Exam  Constitutional: He is oriented to person, place, and time. He appears well-developed and well-nourished.  HENT:  Head: Normocephalic and atraumatic.  Cerumen impaction in bilateral ears.  Eyes: Pupils are equal, round, and reactive to light.  Cardiovascular: Normal rate and regular rhythm.   No murmur heard. Pulmonary/Chest: Effort normal and breath sounds normal. No respiratory distress.  Abdominal: Soft. There is no tenderness. There is no rebound and no guarding.  Musculoskeletal: He exhibits no edema or tenderness.  Neurological: He is alert and oriented to person, place, and time.  5 out of 5 strength in all four extremities.  Sensation to light touch in all 4 extremities. Slight drooping of the left eyelid, patient states this is chronic.  Skin: Skin is warm and dry.  Psychiatric: He has a normal mood and affect. His behavior is normal.  Nursing note and vitals reviewed.    ED Treatments / Results  Labs (all labs ordered are listed, but only abnormal results are displayed) Labs Reviewed  CBC WITH DIFFERENTIAL/PLATELET - Abnormal; Notable for the following:       Result Value   WBC 14.9 (*)    Neutro Abs 11.7 (*)    Monocytes Absolute 1.4 (*)    All other components within normal limits  COMPREHENSIVE METABOLIC PANEL  D-DIMER, QUANTITATIVE (NOT AT [facility])  I-STAT TROPOININ, ED    EKG  EKG  Interpretation  Date/Time:  Monday April 13 2016 12:14:56 EDT Ventricular Rate:  83 PR Interval:    QRS Duration: 91 QT Interval:  386 QTC Calculation: 454 R Axis:   34 Text Interpretation:  Sinus rhythm Consider inferior infarct Nonspecific T abnormalities, lateral leads Confirmed by Lincoln Brigham [phone]) on 04/13/2016 1:09:11 PM       Radiology Dg Chest 2 View  Result Date: 04/13/2016 CLINICAL DATA:  Syncope. Dizziness. Hypotension. Neurofibromatosis type 1. EXAM: CHEST  2 VIEW COMPARISON:  07/29/2008 FINDINGS: Heart size is normal. Posterior spine fusion hardware seen in the lower cervical upper thoracic spine. A left superior posterior mediastinal mass shows no significant change, consistent with a neurofibroma. No  evidence of pulmonary infiltrate or pleural effusion. VP shunt tubing noted within the right chest wall. IMPRESSION: No active lung disease. Stable left posterior mediastinal mass, consistent with known neurofibroma. Electronically Signed   By: Myles Rosenthal M.D.   On: 04/13/2016 11:56   Ct Head Wo Contrast  Result Date: 04/13/2016 CLINICAL DATA:  Headache. History of neurofibromatosis. Ventriculostomy shunt catheter in place. EXAM: CT HEAD WITHOUT CONTRAST TECHNIQUE: Contiguous axial images were obtained from the base of the skull through the vertex without intravenous contrast. COMPARISON:  Head CT scan 07/29/2008.  Brain MRI 07/12/2010. FINDINGS: Brain: No acute abnormality including hemorrhage, infarct, mass lesion, mass effect, midline shift or abnormal extra-axial fluid collection. No pneumocephalus. Right frontal approach ventriculostomy shunt catheter is in place. Asymmetry of the frontal horns the ventricular system, more prominent on the right, is unchanged. The ventricular system does not appear dilated. No pneumocephalus. Vascular: Negative. Skull: No fracture or worrisome lesion. Sinuses/Orbits: Negative. Other: None. IMPRESSION: No acute abnormality. Negative for hydrocephalus  with a shunt catheter in place. Electronically Signed   By: Drusilla Kanner M.D.   On: 04/13/2016 12:24    Procedures .Ear Cerumen Removal Date/Time: 04/14/2016 6:53 AM Performed by: Tilden Fossa Authorized by: Tilden Fossa   Consent:    Consent obtained:  Verbal   Consent given by:  Patient Procedure details:    Location:  L ear and R ear   Procedure type: curette     Procedure type comment:  With irrigation, colace Post-procedure details:    Inspection:  Macerated skin and TM intact   Hearing quality:  Improved   Patient tolerance of procedure:  Tolerated well, no immediate complications   (including critical care time)  Medications Ordered in ED Medications  sodium chloride 0.9 % bolus 1,000 mL (0 mLs Intravenous Stopped 04/13/16 1405)  docusate sodium (COLACE) capsule 200 mg (200 mg Oral Given 04/13/16 1531)     Initial Impression / Assessment and Plan / ED Course  I have reviewed the triage vital signs and the nursing notes.  Pertinent labs & imaging results that were available during my care of the patient were reviewed by me and considered in my medical decision making (see chart for details).     Patient with history of NF1 here following a syncopal event. Patient is feeling improved in the emergency department with persistent decreased hearing in bilateral ears. Ears do demonstrate cerumen impaction - hearing improved after treatment. Presentation is not consistent with SAH, PE, ACS. CBC with slight leukocytosis, likely secondary to syncope as there is no evidence of acute infectious process at this time. D/w patient home care for syncope, importance of PCP follow-up and return precautions.  Final Clinical Impressions(s) / ED Diagnoses   Final diagnoses:  Syncope, unspecified syncope type  Bilateral impacted cerumen    New Prescriptions Discharge Medication List as of 04/13/2016  4:21 PM       Tilden Fossa, MD 04/14/16 [phone]

## 2016-04-13 NOTE — ED Triage Notes (Signed)
PT RECEIVED FROM [facility] FOR HYPOTENSION. PT WENT DUE TO DIZZINESS, NASAL AND EAR CONGESTION, NO COUGHING. PT STS WHILE BEING INTERVIEWED BY THE NP, HE BEGAN TO FEEL REALLY DIZZY AND LAID DOWN ON THE FLOOR. DENIES LOC. PER EMS, WHEN THEY ARRIVED, HE WAS LYING ON THE FLOOR, PALE AND DIAPHORETIC. [place]'S VITALS 100/60, HR 79, CBG 107, 97% ON R/A. EMS 70/40, HR 70. PT GIVEN NS BOLUS. AFTER , BP 103/69. AAOX4. DENIES CP OR SOB.

## 2016-04-13 NOTE — Progress Notes (Signed)
Subjective:  Patient ID: Larry Riley, male    DOB: 1980-05-10  Age: 36 y.o. MRN: [id number]  CC: Hearing Loss (hearing loss on both ears yesterday. had cold symptoms for 1 wk. took mucinex)   HPI Larry Riley presents in office with dizziness and right hearing loss x 1week.   Outpatient Medications Prior to Visit  Medication Sig Dispense Refill  . acetaminophen (TYLENOL) 325 MG tablet Take 650 mg by mouth every 6 (six) hours as needed for moderate pain.    . cyclobenzaprine (FLEXERIL) 10 MG tablet TAKE ONE TABLET BY MOUTH THREE TIMES A DAY AS NEEDED FOR MUSCLE SPASMS 90 tablet 2  . EPINEPHrine (EPIPEN 2-PAK) 0.3 mg/0.3 mL IJ SOAJ injection Inject 0.3 mLs (0.3 mg total) into the muscle once. 1 Device 1  . omeprazole (PRILOSEC) 10 MG capsule Take 10 mg by mouth daily.     . pantoprazole (PROTONIX) 40 MG tablet Take 1 tablet (40 mg total) by mouth daily. 90 tablet 3  . traMADol (ULTRAM) 50 MG tablet Take 1 tablet (50 mg total) by mouth every 8 (eight) hours as needed. 90 tablet 0   No facility-administered medications prior to visit.     ROS See HPI  Objective:  BP 100/60   Pulse (!) 107   Temp 97.7 F (36.5 C)   Resp 16   Ht  (1.727 m)   Wt 197 lb (89.4 kg)   SpO2 97%   BMI 29.95 kg/m   BP Readings from Last 3 Encounters:  04/13/16 113/77  04/13/16 100/60  06/13/15 124/80    Wt Readings from Last 3 Encounters:  04/13/16 197 lb (89.4 kg)  04/13/16 197 lb (89.4 kg)  06/13/15 200 lb 6.4 oz (90.9 kg)    Physical Exam  Constitutional: He is oriented to person, place, and time.  Found Larry Riley sitting on floor in exam room. He was alert and oriented but lethargic. He stated he sat on floor because he felt dizzy and nauseous.  Eyes: EOM are normal. Pupils are equal, round, and reactive to light.  Neck: Normal range of motion. Neck supple.  Cardiovascular: Normal rate, regular rhythm and normal heart sounds.   Pulmonary/Chest: Effort normal and breath sounds  normal.  Musculoskeletal: Normal range of motion.  Neurological: He is alert and oriented to person, place, and time.  Skin: Skin is warm and dry. He is not diaphoretic. There is pallor.  Vitals reviewed.   Lab Results  Component Value Date   WBC 14.9 (H) 04/13/2016   HGB 14.0 04/13/2016   HCT 40.6 04/13/2016   PLT 384 04/13/2016   GLUCOSE 91 06/13/2015   CHOL 162 06/13/2015   TRIG 77.0 06/13/2015   HDL 52.60 06/13/2015   LDLCALC 94 06/13/2015   ALT 40 06/13/2015   AST 24 06/13/2015   NA 141 06/13/2015   K 4.3 06/13/2015   CL 105 06/13/2015   CREATININE 0.98 06/13/2015   BUN 14 06/13/2015   CO2 29 06/13/2015   TSH 3.25 06/13/2015    Dg Chest 2 View  Result Date: 04/13/2016 CLINICAL DATA:  Syncope. Dizziness. Hypotension. Neurofibromatosis type 1. EXAM: CHEST  2 VIEW COMPARISON:  07/29/2008 FINDINGS: Heart size is normal. Posterior spine fusion hardware seen in the lower cervical upper thoracic spine. A left superior posterior mediastinal mass shows no significant change, consistent with a neurofibroma. No evidence of pulmonary infiltrate or pleural effusion. VP shunt tubing noted within the right chest wall. IMPRESSION: No active lung disease.  Stable left posterior mediastinal mass, consistent with known neurofibroma. Electronically Signed   By: Myles Rosenthal M.D.   On: 04/13/2016 11:56   Ct Head Wo Contrast  Result Date: 04/13/2016 CLINICAL DATA:  Headache. History of neurofibromatosis. Ventriculostomy shunt catheter in place. EXAM: CT HEAD WITHOUT CONTRAST TECHNIQUE: Contiguous axial images were obtained from the base of the skull through the vertex without intravenous contrast. COMPARISON:  Head CT scan 07/29/2008.  Brain MRI 07/12/2010. FINDINGS: Brain: No acute abnormality including hemorrhage, infarct, mass lesion, mass effect, midline shift or abnormal extra-axial fluid collection. No pneumocephalus. Right frontal approach ventriculostomy shunt catheter is in place. Asymmetry of  the frontal horns the ventricular system, more prominent on the right, is unchanged. The ventricular system does not appear dilated. No pneumocephalus. Vascular: Negative. Skull: No fracture or worrisome lesion. Sinuses/Orbits: Negative. Other: None. IMPRESSION: No acute abnormality. Negative for hydrocephalus with a shunt catheter in place. Electronically Signed   By: Drusilla Kanner M.D.   On: 04/13/2016 12:24    Assessment & Plan:   Larry Riley was seen today for hearing loss.  Diagnoses and all orders for this visit:  Syncope and collapse -     POCT Glucose (CBG)  Dizziness -     POCT Glucose (CBG)   I am having Larry Riley maintain his omeprazole, acetaminophen, pantoprazole, EPINEPHrine, traMADol, cyclobenzaprine, diphenhydrAMINE, and bisacodyl.  Meds ordered this encounter  Medications  . diphenhydrAMINE (BENADRYL) 50 MG capsule    Sig: Take 50 mg by mouth.  . bisacodyl (DULCOLAX) 5 MG EC tablet    Sig: Take 5 mg by mouth.    Follow-up: Return if symptoms worsen or fail to improve.  Alysia Penna, NP

## 2016-04-13 NOTE — ED Notes (Signed)
Bed: ZO10 Expected date:  Expected time:  Means of arrival: Ambulance Comments: EMS hypotensive syncope

## 2016-04-13 NOTE — ED Notes (Signed)
PT DISCHARGED. INSTRUCTIONS GIVEN. AAOX4. PT IN NO APPARENT DISTRESS OR PAIN. THE OPPORTUNITY TO ASK QUESTIONS WAS PROVIDED. 

## 2016-04-23 ENCOUNTER — Ambulatory Visit (INDEPENDENT_AMBULATORY_CARE_PROVIDER_SITE_OTHER): Payer: 59 | Admitting: Family

## 2016-04-23 ENCOUNTER — Encounter: Payer: Self-pay | Admitting: Family

## 2016-04-23 VITALS — BP 128/90 | HR 83 | Temp 97.6°F | Resp 16 | Ht 68.0 in | Wt 197.0 lb

## 2016-04-23 DIAGNOSIS — R55 Syncope and collapse: Secondary | ICD-10-CM

## 2016-04-23 DIAGNOSIS — B351 Tinea unguium: Secondary | ICD-10-CM | POA: Diagnosis not present

## 2016-04-23 DIAGNOSIS — H6123 Impacted cerumen, bilateral: Secondary | ICD-10-CM

## 2016-04-23 DIAGNOSIS — H6120 Impacted cerumen, unspecified ear: Secondary | ICD-10-CM | POA: Insufficient documentation

## 2016-04-23 MED ORDER — TRAMADOL HCL 50 MG PO TABS: 50.0000 mg | ORAL_TABLET | Freq: Three times a day (TID) | ORAL | 0 refills | Status: DC | PRN

## 2016-04-23 NOTE — Assessment & Plan Note (Signed)
Symptoms and exam consistent with onychomycosis. Recommend OTC nail lacquer. Refer to podiatry for further assessment and treatment.

## 2016-04-23 NOTE — Progress Notes (Signed)
Subjective:    Patient ID: Larry Riley, male    DOB: February 08, 1980, 36 y.o.   MRN: [id number]  Chief Complaint  Patient presents with  . Hospitalization Follow-up    allergies     HPI:  Larry Riley is a 36 y.o. male who  has a past medical history of MRSA (methicillin resistant Staphylococcus aureus) (2005) and Neurofibromatosis. and presents today for a follow up office visit.  1.) Syncope - Recently evaluated in the ED following a syncopal episode occurring in the primary care office. Physical exam he is noted to have cerumen in production bilateral ears with no other symptoms noted. EKG showed normal sinus rhythm. Chest x-ray shows no active lung disease with stable left posterior mediastinal mass consistent with known neurofibroma. CT scan of the head shows no acute abnormalities and negative for hydrocephalus with shunt catheter in place and appearing functional. Bilateral ears were cleaned with curet and irrigated. Hearing was improved. Procedure was tolerated well. He was given a liter of fluid and had improvement in his symptoms. There is no evidence of infectious processes. All ED records, labs, and imaging reviewed in detail.  Since leaving the ED he reports no further episodes of syncope or dizziness. Has been working on improving his physical activity, sleep and nutrition. He does have some decreased hearing in the left side with some tinnitus. No fevers, chest pain, or shortness of breath. Has also been having the associated symptoms of rhinorrhea, itchy watery eyes and sneezing.   2.) Toe-nail fungus - This is a new problem. Associated symptom of thickening and changes to toe-nail color has been going on for several months and has worsened since initial onset. Denies any modifying factors or attempted treatments that make it better or worse. Requesting referral to podiatry for further treatment.    No Known Allergies    Outpatient Medications Prior to Visit  Medication Sig  Dispense Refill  . acetaminophen (TYLENOL) 325 MG tablet Take 650 mg by mouth every 6 (six) hours as needed for mild pain, moderate pain, fever or headache.     . bisacodyl (DULCOLAX) 5 MG EC tablet Take 5 mg by mouth daily as needed for mild constipation.     . cyclobenzaprine (FLEXERIL) 10 MG tablet TAKE ONE TABLET BY MOUTH THREE TIMES A DAY AS NEEDED FOR MUSCLE SPASMS 90 tablet 2  . diphenhydrAMINE (BENADRYL) 25 mg capsule Take 25 mg by mouth at bedtime as needed for allergies or sleep.    Marland Kitchen EPINEPHrine (EPIPEN 2-PAK) 0.3 mg/0.3 mL IJ SOAJ injection Inject 0.3 mLs (0.3 mg total) into the muscle once. (Patient taking differently: Inject 0.3 mg into the muscle once as needed (for severe allergic reaction). ) 1 Device 1  . guaiFENesin (MUCINEX) 600 MG 12 hr tablet Take 600 mg by mouth 2 (two) times daily as needed for cough or to loosen phlegm.    Marland Kitchen ibuprofen (ADVIL,MOTRIN) 200 MG tablet Take 400 mg by mouth every 6 (six) hours as needed for headache, mild pain or moderate pain.    Marland Kitchen omeprazole (PRILOSEC) 20 MG capsule Take 20 mg by mouth daily as needed (for acid reflux).    . pantoprazole (PROTONIX) 40 MG tablet Take 1 tablet (40 mg total) by mouth daily. 90 tablet 3  . traMADol (ULTRAM) 50 MG tablet Take 1 tablet (50 mg total) by mouth every 8 (eight) hours as needed. (Patient taking differently: Take 50 mg by mouth every 8 (eight) hours as needed for moderate pain. )  90 tablet 0   No facility-administered medications prior to visit.       Past Surgical History:  Procedure Laterality Date  . hydrocephalus     shunt 1999-[place]  . NECK SURGERY      tumor removal  . SPINE SURGERY  10/2013   c2-t2 fusion  , laminectomy c1-6-- Dr Raynald Kemp-- [place]  . VASECTOMY  02/2013      Past Medical History:  Diagnosis Date  . MRSA (methicillin resistant Staphylococcus aureus) 2005   leg  . Neurofibromatosis    tumor down spine and brain      Review of Systems  Constitutional: Negative for  chills and fever.  HENT: Positive for congestion and sneezing.   Eyes: Positive for itching.  Respiratory: Negative for chest tightness and shortness of breath.   Cardiovascular: Negative for chest pain, palpitations and leg swelling.  Gastrointestinal: Negative for abdominal distention, abdominal pain, anal bleeding, blood in stool, constipation, diarrhea, nausea, rectal pain and vomiting.  Neurological: Negative for dizziness, tremors, seizures, syncope, speech difficulty, weakness and headaches.      Objective:    BP 128/90 (BP Location: Left Arm, Patient Position: Sitting, Cuff Size: Large)   Pulse 83   Temp 97.6 F (36.4 C) (Oral)   Resp 16   Ht  (1.727 m)   Wt 197 lb (89.4 kg)   SpO2 93%   BMI 29.95 kg/m  Nursing note and vital signs reviewed.  Physical Exam  Constitutional: He is oriented to person, place, and time. He appears well-developed and well-nourished. No distress.  HENT:  Right Ear: Hearing, external ear and ear canal normal.  Left Ear: Hearing, external ear and ear canal normal.  Nose: Nose normal. Right sinus exhibits no maxillary sinus tenderness and no frontal sinus tenderness. Left sinus exhibits no maxillary sinus tenderness and no frontal sinus tenderness.  Mouth/Throat: Uvula is midline, oropharynx is clear and moist and mucous membranes are normal.  Cerumen noted in bilateral ears.   Cardiovascular: Normal rate, regular rhythm, normal heart sounds and intact distal pulses.   Pulmonary/Chest: Effort normal and breath sounds normal.  Neurological: He is alert and oriented to person, place, and time.  Skin: Skin is warm and dry.  Toenail thickening and discoloration noted.   Psychiatric: He has a normal mood and affect. His behavior is normal. Judgment and thought content normal.       Assessment & Plan:   Problem List Items Addressed This Visit      Cardiovascular and Mediastinum   Syncope - Primary    No further episodes of syncope and most  likely related to dehydration. Cardiac and neurological exam are normal. Reports feeling well. No further treatment necessary at this time. Continue to monitor and follow up if symptoms return or worsen.         Nervous and Auditory   Cerumen impaction    Continues to have increased levels of cerumen that appear to be impacted. Recommend OTC medications to help relieve symptoms. Follow up for worsening or ear cleaning as needed.         Musculoskeletal and Integument   Onychomycosis    Symptoms and exam consistent with onychomycosis. Recommend OTC nail lacquer. Refer to podiatry for further assessment and treatment.       Relevant Orders   Ambulatory referral to Podiatry       I am having Mr. Bozzi maintain his acetaminophen, pantoprazole, EPINEPHrine, cyclobenzaprine, bisacodyl, diphenhydrAMINE, omeprazole, ibuprofen, guaiFENesin, and traMADol.  Meds ordered this encounter  Medications  . traMADol (ULTRAM) 50 MG tablet    Sig: Take 1 tablet (50 mg total) by mouth every 8 (eight) hours as needed.    Dispense:  90 tablet    Refill:  0    Order Specific Question:   Supervising Provider    Answer:   Hillard Danker A [[id number]]     Follow-up: Return in about 6 months (around 10/23/2016), or if symptoms worsen or fail to improve.  Jeanine Luz, FNP

## 2016-04-23 NOTE — Assessment & Plan Note (Signed)
No further episodes of syncope and most likely related to dehydration. Cardiac and neurological exam are normal. Reports feeling well. No further treatment necessary at this time. Continue to monitor and follow up if symptoms return or worsen.

## 2016-04-23 NOTE — Patient Instructions (Signed)
Thank you for choosing [facility].  SUMMARY AND INSTRUCTIONS:  Please continue to take your medications as prescribed.  They will call to schedule your appointment with podiatry.  Medication:  Your prescription(s) have been submitted to your pharmacy or been printed and provided for you. Please take as directed and contact our office if you believe you are having problem(s) with the medication(s) or have any questions.  Follow up:  If your symptoms worsen or fail to improve, please contact our office for further instruction, or in case of emergency go directly to the emergency room at the closest medical facility.

## 2016-04-23 NOTE — Assessment & Plan Note (Signed)
Continues to have increased levels of cerumen that appear to be impacted. Recommend OTC medications to help relieve symptoms. Follow up for worsening or ear cleaning as needed.

## 2016-05-22 ENCOUNTER — Encounter: Payer: Self-pay | Admitting: Podiatry

## 2016-05-22 ENCOUNTER — Ambulatory Visit (INDEPENDENT_AMBULATORY_CARE_PROVIDER_SITE_OTHER): Payer: 59 | Admitting: Podiatry

## 2016-05-22 VITALS — Resp 16 | Ht 68.0 in | Wt 190.0 lb

## 2016-05-22 DIAGNOSIS — B351 Tinea unguium: Secondary | ICD-10-CM

## 2016-05-22 DIAGNOSIS — M79605 Pain in left leg: Secondary | ICD-10-CM

## 2016-05-22 DIAGNOSIS — M79604 Pain in right leg: Secondary | ICD-10-CM | POA: Diagnosis not present

## 2016-05-22 LAB — HEPATIC FUNCTION PANEL
ALT: 37 U/L (ref 9–46)
AST: 28 U/L (ref 10–40)
Albumin: 4.1 g/dL (ref 3.6–5.1)
Alkaline Phosphatase: 90 U/L (ref 40–115)
BILIRUBIN INDIRECT: 0.4 mg/dL (ref 0.2–1.2)
Bilirubin, Direct: 0.1 mg/dL (ref <=0.2)
Total Bilirubin: 0.5 mg/dL (ref 0.2–1.2)
Total Protein: 6.1 g/dL (ref 6.1–8.1)

## 2016-05-22 MED ORDER — TERBINAFINE HCL 250 MG PO TABS: 250.0000 mg | ORAL_TABLET | Freq: Every day | ORAL | 0 refills | Status: DC

## 2016-05-22 NOTE — Progress Notes (Signed)
   Subjective:    Patient ID: Larry HammondJoshua Riley, male    DOB: [birth year], 36 y.o.   MRN: [id number]  HPI Chief Complaint  Patient presents with  . Nail Problem    Bilateral; nail discoloration & thickened nail; pt stated, "Has had for a long time; needs all nails checked for nail fungus"      Review of Systems  All other systems reviewed and are negative.      Objective:   Physical Exam        Assessment & Plan:

## 2016-05-24 NOTE — Progress Notes (Signed)
Subjective:    Patient ID: Larry HammondJoshua Riley, male   DOB: 36 y.o.   MRN: [id number]   HPI patient presents with long-term chronic nail disease 1-5 both feet that are very thick and dystrophic and that also his skin becomes itchy and blisters at times and it's been present for a number of years    Review of Systems  All other systems reviewed and are negative.       Objective:  Physical Exam  Constitutional: He is oriented to person, place, and time.  Cardiovascular: Intact distal pulses.   Musculoskeletal: Normal range of motion.  Neurological: He is alert and oriented to person, place, and time.  Skin: Skin is warm.  Nursing note and vitals reviewed.  neurovascular status intact muscle strength adequate range of motion within normal limits with patient found to have significant nail disease 1-5 both feet with yellow dystrophic changes odor and multiple signs of fungal infiltration. Found have good digital perfusion well oriented 3     Assessment:    I chronic nail infection 1-5 both feet     Plan:    H&P conditions reviewed and education rendered. I recommended due to the nature of the nails and skin that we utilize oral Lamisil and I explained the procedure and risk and he wants to do this. I am sending him for liver function study first and he can then initiate treatment

## 2016-05-30 ENCOUNTER — Other Ambulatory Visit: Payer: Self-pay | Admitting: Family

## 2016-06-10 ENCOUNTER — Encounter: Payer: Self-pay | Admitting: Family

## 2016-06-10 ENCOUNTER — Other Ambulatory Visit: Payer: Self-pay | Admitting: Family

## 2016-06-10 DIAGNOSIS — Z6379 Other stressful life events affecting family and household: Secondary | ICD-10-CM

## 2016-06-11 ENCOUNTER — Encounter: Payer: Self-pay | Admitting: Family

## 2016-06-15 ENCOUNTER — Encounter: Payer: Self-pay | Admitting: Family

## 2016-06-15 ENCOUNTER — Ambulatory Visit (INDEPENDENT_AMBULATORY_CARE_PROVIDER_SITE_OTHER): Payer: 59 | Admitting: Family

## 2016-06-15 ENCOUNTER — Other Ambulatory Visit (INDEPENDENT_AMBULATORY_CARE_PROVIDER_SITE_OTHER): Payer: 59

## 2016-06-15 VITALS — BP 128/72 | HR 107 | Temp 98.5°F | Resp 16 | Ht 68.0 in | Wt 192.1 lb

## 2016-06-15 DIAGNOSIS — Z0000 Encounter for general adult medical examination without abnormal findings: Secondary | ICD-10-CM | POA: Diagnosis not present

## 2016-06-15 DIAGNOSIS — Q8501 Neurofibromatosis, type 1: Secondary | ICD-10-CM

## 2016-06-15 LAB — COMPREHENSIVE METABOLIC PANEL
ALT: 60 U/L — AB (ref 0–53)
AST: 29 U/L (ref 0–37)
Albumin: 4.8 g/dL (ref 3.5–5.2)
Alkaline Phosphatase: 103 U/L (ref 39–117)
BILIRUBIN TOTAL: 0.4 mg/dL (ref 0.2–1.2)
BUN: 18 mg/dL (ref 6–23)
CALCIUM: 10.1 mg/dL (ref 8.4–10.5)
CO2: 29 meq/L (ref 19–32)
CREATININE: 0.89 mg/dL (ref 0.40–1.50)
Chloride: 105 mEq/L (ref 96–112)
GFR: 102.84 mL/min (ref >60.00)
GLUCOSE: 85 mg/dL (ref 70–99)
Potassium: 4.1 mEq/L (ref 3.5–5.1)
SODIUM: 139 meq/L (ref 135–145)
Total Protein: 7.2 g/dL (ref 6.0–8.3)

## 2016-06-15 LAB — LIPID PANEL
CHOL/HDL RATIO: 3
Cholesterol: 159 mg/dL (ref 0–200)
HDL: 51.6 mg/dL (ref >39.00)
LDL CALC: 91 mg/dL (ref 0–99)
NONHDL: 107.24
TRIGLYCERIDES: 82 mg/dL (ref 0.0–149.0)
VLDL: 16.4 mg/dL (ref 0.0–40.0)

## 2016-06-15 LAB — CBC
HCT: 43.6 % (ref 39.0–52.0)
Hemoglobin: 15.1 g/dL (ref 13.0–17.0)
MCHC: 34.6 g/dL (ref 30.0–36.0)
MCV: 86.8 fl (ref 78.0–100.0)
PLATELETS: 430 10*3/uL — AB (ref 150.0–400.0)
RBC: 5.03 Mil/uL (ref 4.22–5.81)
RDW: 13.4 % (ref 11.5–15.5)
WBC: 8 10*3/uL (ref 4.0–10.5)

## 2016-06-15 NOTE — Assessment & Plan Note (Addendum)
1) Anticipatory Guidance: Discussed importance of wearing a seatbelt while driving and not texting while driving; changing batteries in smoke detector at least once annually; wearing suntan lotion when outside; eating a balanced and moderate diet; getting physical activity at least 30 minutes per day.  2) Immunizations / Screenings / Labs:  All immunizations are up-to-date per recommendations. Declines HIV screening. All other screenings are up-to-date per recommendations. Obtain CBC, CMET, and lipid profile.    Overall well exam with risk factors for cardiovascular disease including overweight. Recommend weight loss of 5-10% of current body weight through nutrition and physical activity. Does experience some constipation which possibly is related to diet. Encouraged increasing fiber to 20-25 g per day, increasing water intake and increasing physical activity to 30 minutes of moderate level activity daily with goal of 10,000 steps per day. Constipation does cause some erectile dysfunction at times. Continue other healthy lifestyle behaviors and choices. Follow-up prevention exam in 1 year. Follow-up office visit pending blood work and for chronic conditions.

## 2016-06-15 NOTE — Patient Instructions (Signed)
Thank you for choosing [facility].  SUMMARY AND INSTRUCTIONS:  Goal of 20-25 grams of fiber per day; Consider Metamucil or Benefiber; Supplement your diet with good sources of fiber.   Ear cleaning - Murine wax removal or Debrox   Goal of increasing physical activity to 30 minutes at least 2-3 times per week or about 10,000 per day.   [facility] Sildenafil program.   Labs:  Please stop by the lab on the lower level of the building for your blood work. Your results will be released to MyChart (or called to you) after review, usually within 72 hours after test completion. If any changes need to be made, you will be notified at that same time.  1.) The lab is open from 7:30am to 5:30 pm Monday-Friday 2.) No appointment is necessary 3.) Fasting (if needed) is 6-8 hours after food and drink; black coffee and water are okay   Follow up:  If your symptoms worsen or fail to improve, please contact our office for further instruction, or in case of emergency go directly to the emergency room at the closest medical facility.    Health Maintenance, Male A healthy lifestyle and preventive care is important for your health and wellness. Ask your health care provider about what schedule of regular examinations is right for you. What should I know about weight and diet? Eat a Healthy Diet  Eat plenty of vegetables, fruits, whole grains, low-fat dairy products, and lean protein.  Do not eat a lot of foods high in solid fats, added sugars, or salt.  Maintain a Healthy Weight Regular exercise can help you achieve or maintain a healthy weight. You should:  Do at least 150 minutes of exercise each week. The exercise should increase your heart rate and make you sweat (moderate-intensity exercise).  Do strength-training exercises at least twice a week.  Watch Your Levels of Cholesterol and Blood Lipids  Have your blood tested for lipids and cholesterol every 5 years starting at 36 years  of age. If you are at high risk for heart disease, you should start having your blood tested when you are 36 years old. You may need to have your cholesterol levels checked more often if: ? Your lipid or cholesterol levels are high. ? You are older than 36 years of age. ? You are at high risk for heart disease.  What should I know about cancer screening? Many types of cancers can be detected early and may often be prevented. Lung Cancer  You should be screened every year for lung cancer if: ? You are a current smoker who has smoked for at least 30 years. ? You are a former smoker who has quit within the past 15 years.  Talk to your health care provider about your screening options, when you should start screening, and how often you should be screened.  Colorectal Cancer  Routine colorectal cancer screening usually begins at 36 years of age and should be repeated every 5-10 years until you are 36 years old. You may need to be screened more often if early forms of precancerous polyps or small growths are found. Your health care provider may recommend screening at an earlier age if you have risk factors for colon cancer.  Your health care provider may recommend using home test kits to check for hidden blood in the stool.  A small camera at the end of a tube can be used to examine your colon (sigmoidoscopy or colonoscopy). This checks for  the earliest forms of colorectal cancer.  Prostate and Testicular Cancer  Depending on your age and overall health, your health care provider may do certain tests to screen for prostate and testicular cancer.  Talk to your health care provider about any symptoms or concerns you have about testicular or prostate cancer.  Skin Cancer  Check your skin from head to toe regularly.  Tell your health care provider about any new moles or changes in moles, especially if: ? There is a change in a mole's size, shape, or color. ? You have a mole that is larger  than a pencil eraser.  Always use sunscreen. Apply sunscreen liberally and repeat throughout the day.  Protect yourself by wearing long sleeves, pants, a wide-brimmed hat, and sunglasses when outside.  What should I know about heart disease, diabetes, and high blood pressure?  If you are 62-3 years of age, have your blood pressure checked every 3-5 years. If you are 36 years of age or older, have your blood pressure checked every year. You should have your blood pressure measured twice-once when you are at a hospital or clinic, and once when you are not at a hospital or clinic. Record the average of the two measurements. To check your blood pressure when you are not at a hospital or clinic, you can use: ? An automated blood pressure machine at a pharmacy. ? A home blood pressure monitor.  Talk to your health care provider about your target blood pressure.  If you are between 91-37 years old, ask your health care provider if you should take aspirin to prevent heart disease.  Have regular diabetes screenings by checking your fasting blood sugar level. ? If you are at a normal weight and have a low risk for diabetes, have this test once every three years after the age of 6. ? If you are overweight and have a high risk for diabetes, consider being tested at a younger age or more often.  A one-time screening for abdominal aortic aneurysm (AAA) by ultrasound is recommended for men aged 65-75 years who are current or former smokers. What should I know about preventing infection? Hepatitis B If you have a higher risk for hepatitis B, you should be screened for this virus. Talk with your health care provider to find out if you are at risk for hepatitis B infection. Hepatitis C Blood testing is recommended for:  Everyone born from [birth year] through 1965.  Anyone with known risk factors for hepatitis C.  Sexually Transmitted Diseases (STDs)  You should be screened each year for STDs including  gonorrhea and chlamydia if: ? You are sexually active and are younger than 36 years of age. ? You are older than 36 years of age and your health care provider tells you that you are at risk for this type of infection. ? Your sexual activity has changed since you were last screened and you are at an increased risk for chlamydia or gonorrhea. Ask your health care provider if you are at risk.  Talk with your health care provider about whether you are at high risk of being infected with HIV. Your health care provider may recommend a prescription medicine to help prevent HIV infection.  What else can I do?  Schedule regular health, dental, and eye exams.  Stay current with your vaccines (immunizations).  Do not use any tobacco products, such as cigarettes, chewing tobacco, and e-cigarettes. If you need help quitting, ask your health care provider.  Limit  alcohol intake to no more than 2 drinks per day. One drink equals 12 ounces of beer, 5 ounces of wine, or 1 ounces of hard liquor.  Do not use street drugs.  Do not share needles.  Ask your health care provider for help if you need support or information about quitting drugs.  Tell your health care provider if you often feel depressed.  Tell your health care provider if you have ever been abused or do not feel safe at home. This information is not intended to replace advice given to you by your health care provider. Make sure you discuss any questions you have with your health care provider. Document Released: 06/27/2007 Document Revised: 08/28/2015 Document Reviewed: 10/02/2014 Elsevier Interactive Patient Education  2018 Elsevier Inc.   High-Fiber Diet Fiber, also called dietary fiber, is a type of carbohydrate found in fruits, vegetables, whole grains, and beans. A high-fiber diet can have many health benefits. Your health care provider may recommend a high-fiber diet to help:  Prevent constipation. Fiber can make your bowel  movements more regular.  Lower your cholesterol.  Relieve hemorrhoids, uncomplicated diverticulosis, or irritable bowel syndrome.  Prevent overeating as part of a weight-loss plan.  Prevent heart disease, type 2 diabetes, and certain cancers.  What is my plan? The recommended daily intake of fiber includes:  38 grams for men under age 82.  30 grams for men over age 72.  25 grams for women under age 37.  21 grams for women over age 17.  You can get the recommended daily intake of dietary fiber by eating a variety of fruits, vegetables, grains, and beans. Your health care provider may also recommend a fiber supplement if it is not possible to get enough fiber through your diet. What do I need to know about a high-fiber diet?  Fiber supplements have not been widely studied for their effectiveness, so it is better to get fiber through food sources.  Always check the fiber content on thenutrition facts label of any prepackaged food. Look for foods that contain at least 5 grams of fiber per serving.  Ask your dietitian if you have questions about specific foods that are related to your condition, especially if those foods are not listed in the following section.  Increase your daily fiber consumption gradually. Increasing your intake of dietary fiber too quickly may cause bloating, cramping, or gas.  Drink plenty of water. Water helps you to digest fiber. What foods can I eat? Grains Whole-grain breads. Multigrain cereal. Oats and oatmeal. Brown rice. Barley. Bulgur wheat. Millet. Bran muffins. Popcorn. Rye wafer crackers. Vegetables Sweet potatoes. Spinach. Kale. Artichokes. Cabbage. Broccoli. Green peas. Carrots. Squash. Fruits Berries. Pears. Apples. Oranges. Avocados. Prunes and raisins. Dried figs. Meats and Other Protein Sources Navy, kidney, pinto, and soy beans. Split peas. Lentils. Nuts and seeds. Dairy Fiber-fortified yogurt. Beverages Fiber-fortified soy milk.  Fiber-fortified orange juice. Other Fiber bars. The items listed above may not be a complete list of recommended foods or beverages. Contact your dietitian for more options. What foods are not recommended? Grains White bread. Pasta made with refined flour. White rice. Vegetables Fried potatoes. Canned vegetables. Well-cooked vegetables. Fruits Fruit juice. Cooked, strained fruit. Meats and Other Protein Sources Fatty cuts of meat. Fried Environmental education officer or fried fish. Dairy Milk. Yogurt. Cream cheese. Sour cream. Beverages Soft drinks. Other Cakes and pastries. Butter and oils. The items listed above may not be a complete list of foods and beverages to avoid. Contact your dietitian for  more information. What are some tips for including high-fiber foods in my diet?  Eat a wide variety of high-fiber foods.  Make sure that half of all grains consumed each day are whole grains.  Replace breads and cereals made from refined flour or white flour with whole-grain breads and cereals.  Replace white rice with brown rice, bulgur wheat, or millet.  Start the day with a breakfast that is high in fiber, such as a cereal that contains at least 5 grams of fiber per serving.  Use beans in place of meat in soups, salads, or pasta.  Eat high-fiber snacks, such as berries, raw vegetables, nuts, or popcorn. This information is not intended to replace advice given to you by your health care provider. Make sure you discuss any questions you have with your health care provider. Document Released: 12/29/2004 Document Revised: 06/06/2015 Document Reviewed: 06/13/2013 Elsevier Interactive Patient Education  2017 [employer].

## 2016-06-15 NOTE — Assessment & Plan Note (Signed)
Stable and currently managed by Dr. Raynald KempHsu or orthopedics with annual follow up in the next couple of months. Continue to monitor.

## 2016-06-15 NOTE — Progress Notes (Signed)
Subjective:    Patient ID: Larry Riley, male    DOB: Jun 15, 1980, 36 y.o.   MRN: [id number]  Chief Complaint  Patient presents with  . CPE    fasting    HPI:  Larry Riley is a 36 y.o. male who presents today for an annual wellness visit.   1) Health Maintenance -   Diet - Averaging about 2-3 meals per day consisting of a regular diet; Working on a vegetarian type diet; Caffeine intake of about 2-3 cups per day.  Exercise - No structured exercise; Working on establish a plan.    2) Preventative Exams / Immunizations:  Dental -- Up to date  Vision -- Up to date   Health Maintenance  Topic Date Due  . HIV Screening  07/11/1995  . INFLUENZA VACCINE  08/12/2016  . TETANUS/TDAP  12/10/2021    Immunization History  Administered Date(s) Administered  . Influenza,inj,Quad PF,36+ Mos 11/28/2012  . Influenza-Unspecified 12/14/2013  . Tdap 12/11/2011     No Known Allergies   Outpatient Medications Prior to Visit  Medication Sig Dispense Refill  . acetaminophen (TYLENOL) 325 MG tablet Take 650 mg by mouth every 6 (six) hours as needed for mild pain, moderate pain, fever or headache.     . bisacodyl (DULCOLAX) 5 MG EC tablet Take 5 mg by mouth daily as needed for mild constipation.     . cyclobenzaprine (FLEXERIL) 10 MG tablet TAKE ONE TABLET BY MOUTH THREE TIMES A DAY AS NEEDED MUSCLE SPASMS 90 tablet 1  . diphenhydrAMINE (BENADRYL) 25 mg capsule Take 25 mg by mouth at bedtime as needed for allergies or sleep.    Marland Kitchen EPINEPHrine (EPIPEN 2-PAK) 0.3 mg/0.3 mL IJ SOAJ injection Inject 0.3 mLs (0.3 mg total) into the muscle once. (Patient taking differently: Inject 0.3 mg into the muscle once as needed (for severe allergic reaction). ) 1 Device 1  . guaiFENesin (MUCINEX) 600 MG 12 hr tablet Take 600 mg by mouth 2 (two) times daily as needed for cough or to loosen phlegm.    Marland Kitchen ibuprofen (ADVIL,MOTRIN) 200 MG tablet Take 400 mg by mouth every 6 (six) hours as needed for  headache, mild pain or moderate pain.    Marland Kitchen omeprazole (PRILOSEC) 20 MG capsule Take 20 mg by mouth daily as needed (for acid reflux).    . pantoprazole (PROTONIX) 40 MG tablet Take 1 tablet (40 mg total) by mouth daily. 90 tablet 3  . terbinafine (LAMISIL) 250 MG tablet Take 1 tablet (250 mg total) by mouth daily. 90 tablet 0  . traMADol (ULTRAM) 50 MG tablet Take 1 tablet (50 mg total) by mouth every 8 (eight) hours as needed. 90 tablet 0   No facility-administered medications prior to visit.      Past Medical History:  Diagnosis Date  . MRSA (methicillin resistant Staphylococcus aureus) 2005   leg  . Neurofibromatosis    tumor down spine and brain     Past Surgical History:  Procedure Laterality Date  . hydrocephalus     shunt 1999-[place]  . NECK SURGERY      tumor removal  . SPINE SURGERY  10/2013   c2-t2 fusion  , laminectomy c1-6-- Dr Raynald Kemp-- [place]  . VASECTOMY  02/2013     Family History  Problem Relation Age of Onset  . Diabetes Mother   . Arthritis Father   . Hypertension Father   . Prostate cancer Father   . Arthritis Paternal Grandmother   . Arthritis Paternal  Grandfather   . Diabetes Paternal Grandfather   . Hyperlipidemia Paternal Grandfather   . Hypertension Paternal Grandfather   . Healthy Maternal Grandmother   . Healthy Maternal Grandfather   . Prostate cancer Unknown      Social History   Social History  . Marital status: Married    Spouse name: N/A  . Number of children: 0  . Years of education: 25   Occupational History  . [employer]    Social History Main Topics  . Smoking status: Never Smoker  . Smokeless tobacco: Never Used  . Alcohol use 1.8 oz/week    3 Glasses of wine per week  . Drug use: No  . Sexual activity: No   Other Topics Concern  . Not on file   Social History Narrative   Fun: Scientist, physiological, fishing, hiking, skiing   Denies religious beliefs effecting health care.       Review of Systems    Constitutional: Denies fever, chills, fatigue, or significant weight gain/loss. HENT: Head: Denies headache or neck pain Ears: Denies changes in hearing, ringing in ears, earache, drainage Nose: Denies discharge, stuffiness, itching, nosebleed, sinus pain Throat: Denies sore throat, hoarseness, dry mouth, sores, thrush Eyes: Denies loss/changes in vision, pain, redness, blurry/double vision, flashing lights Cardiovascular: Denies chest pain/discomfort, tightness, palpitations, shortness of breath with activity, difficulty lying down, swelling, sudden awakening with shortness of breath Respiratory: Denies shortness of breath, cough, sputum production, wheezing Gastrointestinal: Denies dysphasia, heartburn, change in appetite, nausea, change in bowel habits, rectal bleeding, constipation, diarrhea, yellow skin or eyes Genitourinary: Denies frequency, b ary strength. Musculoskeletal: Denies muscle/joint pain, stiffness, back pain, redness or swelling of joints, trauma Skin: Denies rashes, lumps, itching, dryness, color changes, or hair/nail changes Neurological: Denies dizziness, fainting, seizures, weakness, numbness, tingling, tremor Psychiatric - Denies nervousness, stress, depression or memory loss Endocrine: Denies heat or cold intolerance, sweating, frequent urination, excessive thirst, changes in appetite Hematologic: Denies ease of bruising or bleeding     Objective:     BP 128/72 (BP Location: Left Arm, Patient Position: Sitting, Cuff Size: Large)   Pulse (!) 107   Temp 98.5 F (36.9 C) (Oral)   Resp 16   Ht 5\' 8"  (1.727 m)   Wt 192 lb 1.9 oz (87.1 kg)   SpO2 97%   BMI 29.21 kg/m  Nursing note and vital signs reviewed.  Physical Exam  Constitutional: He is oriented to person, place, and time. He appears well-developed and well-nourished.  HENT:  Head: Normocephalic.  Right Ear: Hearing, tympanic membrane, external ear and ear canal normal.  Left Ear: Hearing, tympanic  membrane, external ear and ear canal normal.  Nose: Nose normal.  Mouth/Throat: Uvula is midline, oropharynx is clear and moist and mucous membranes are normal.  Tongue chronically deviated to the right.  Eyes: Conjunctivae and EOM are normal. Pupils are equal, round, and reactive to light.  Neck: Neck supple. No JVD present. No tracheal deviation present. No thyromegaly present.  Cardiovascular: Normal rate, regular rhythm, normal heart sounds and intact distal pulses.   Pulmonary/Chest: Effort normal and breath sounds normal.  Abdominal: Soft. Bowel sounds are normal. He exhibits no distension and no mass. There is no tenderness. There is no rebound and no guarding.  Musculoskeletal: Normal range of motion. He exhibits no edema or tenderness.  Lymphadenopathy:    He has no cervical adenopathy.  Neurological: He is alert and oriented to person, place, and time. He has normal reflexes. No cranial nerve  deficit. He exhibits normal muscle tone. Coordination normal.  Skin: Skin is warm and dry.  Psychiatric: He has a normal mood and affect. His behavior is normal. Judgment and thought content normal.       Assessment & Plan:   Problem List Items Addressed This Visit      Nervous and Auditory   Neurofibromatosis, type 1 (HCC)    Stable and currently managed by Dr. Raynald KempHsu or orthopedics with annual follow up in the next couple of months. Continue to monitor.         Other   Routine general medical examination at a health care facility - Primary    1) Anticipatory Guidance: Discussed importance of wearing a seatbelt while driving and not texting while driving; changing batteries in smoke detector at least once annually; wearing suntan lotion when outside; eating a balanced and moderate diet; getting physical activity at least 30 minutes per day.  2) Immunizations / Screenings / Labs:  All immunizations are up-to-date per recommendations. Declines HIV screening. All other screenings are  up-to-date per recommendations. Obtain CBC, CMET, and lipid profile.    Overall well exam with risk factors for cardiovascular disease including overweight. Recommend weight loss of 5-10% of current body weight through nutrition and physical activity. Does experience some constipation which possibly is related to diet. Encouraged increasing fiber to 20-25 g per day, increasing water intake and increasing physical activity to 30 minutes of moderate level activity daily with goal of 10,000 steps per day. Constipation does cause some erectile dysfunction at times. Continue other healthy lifestyle behaviors and choices. Follow-up prevention exam in 1 year. Follow-up office visit pending blood work and for chronic conditions.       Relevant Orders   CBC   Comprehensive metabolic panel   Lipid panel       I am having Mr. Carriero maintain his acetaminophen, pantoprazole, EPINEPHrine, bisacodyl, diphenhydrAMINE, omeprazole, ibuprofen, guaiFENesin, traMADol, terbinafine, and cyclobenzaprine.   Follow-up: Return in about 3 months (around 09/15/2016), or if symptoms worsen or fail to improve.   Jeanine Luzalone, Gregory, FNP

## 2016-06-23 ENCOUNTER — Other Ambulatory Visit: Payer: Self-pay | Admitting: Family

## 2016-06-23 ENCOUNTER — Other Ambulatory Visit: Payer: Self-pay | Admitting: Podiatry

## 2016-06-24 ENCOUNTER — Telehealth: Payer: Self-pay | Admitting: *Deleted

## 2016-06-24 ENCOUNTER — Encounter: Payer: Self-pay | Admitting: Family

## 2016-06-24 MED ORDER — TERBINAFINE HCL 250 MG PO TABS: 250.0000 mg | ORAL_TABLET | Freq: Every day | ORAL | 1 refills | Status: DC

## 2016-06-24 MED ORDER — TRAMADOL HCL 50 MG PO TABS: 50.0000 mg | ORAL_TABLET | Freq: Three times a day (TID) | ORAL | 0 refills | Status: DC | PRN

## 2016-06-24 NOTE — Telephone Encounter (Signed)
Pt states insurance would only allow for Terbinafine to be filled #30 at a time. Dr. Charlsie Merlesegal states refill as #30 +1refill for pt's convenience.

## 2016-06-24 NOTE — Telephone Encounter (Signed)
Faxed script to [facility]../[facility] 

## 2016-07-02 ENCOUNTER — Encounter: Payer: Self-pay | Admitting: Family

## 2016-07-03 ENCOUNTER — Telehealth: Payer: Self-pay | Admitting: *Deleted

## 2016-07-03 MED ORDER — SILDENAFIL CITRATE 20 MG PO TABS: ORAL_TABLET | ORAL | 1 refills | Status: DC

## 2016-07-03 NOTE — Telephone Encounter (Signed)
Victorino DikeJennifer from [facility] call and stated that pt was looking for prescription for the Sildenafil. Calling to see if ok and send prescription electronically...Raechel Chute/[facility]

## 2016-07-03 NOTE — Telephone Encounter (Signed)
Prescription sent

## 2016-07-21 ENCOUNTER — Ambulatory Visit (INDEPENDENT_AMBULATORY_CARE_PROVIDER_SITE_OTHER): Payer: 59 | Admitting: Psychology

## 2016-07-21 DIAGNOSIS — F411 Generalized anxiety disorder: Secondary | ICD-10-CM | POA: Diagnosis not present

## 2016-07-26 ENCOUNTER — Encounter: Payer: Self-pay | Admitting: Podiatry

## 2016-07-26 ENCOUNTER — Encounter: Payer: Self-pay | Admitting: Family

## 2016-07-27 ENCOUNTER — Telehealth: Payer: Self-pay | Admitting: *Deleted

## 2016-07-27 MED ORDER — TERBINAFINE HCL 250 MG PO TABS: 250.0000 mg | ORAL_TABLET | Freq: Every day | ORAL | 0 refills | Status: DC

## 2016-07-27 NOTE — Telephone Encounter (Signed)
Pt emailed request for 30 day rx of terbinafine final round to be called to [facility] due to insurance requirements.  [phone].

## 2016-07-28 ENCOUNTER — Other Ambulatory Visit: Payer: Self-pay | Admitting: Family

## 2016-07-28 ENCOUNTER — Ambulatory Visit (INDEPENDENT_AMBULATORY_CARE_PROVIDER_SITE_OTHER): Payer: 59 | Admitting: Psychology

## 2016-07-28 DIAGNOSIS — F411 Generalized anxiety disorder: Secondary | ICD-10-CM | POA: Diagnosis not present

## 2016-07-29 ENCOUNTER — Other Ambulatory Visit: Payer: Self-pay | Admitting: Family

## 2016-07-29 DIAGNOSIS — R945 Abnormal results of liver function studies: Principal | ICD-10-CM

## 2016-07-29 DIAGNOSIS — R7989 Other specified abnormal findings of blood chemistry: Secondary | ICD-10-CM

## 2016-07-29 MED ORDER — LORAZEPAM 0.5 MG PO TABS: 0.5000 mg | ORAL_TABLET | Freq: Every day | ORAL | 1 refills | Status: DC | PRN

## 2016-08-13 ENCOUNTER — Ambulatory Visit (INDEPENDENT_AMBULATORY_CARE_PROVIDER_SITE_OTHER): Payer: 59 | Admitting: Psychology

## 2016-08-13 DIAGNOSIS — F411 Generalized anxiety disorder: Secondary | ICD-10-CM | POA: Diagnosis not present

## 2016-08-18 ENCOUNTER — Other Ambulatory Visit: Payer: Self-pay | Admitting: Podiatry

## 2016-08-18 ENCOUNTER — Other Ambulatory Visit: Payer: Self-pay | Admitting: Family

## 2016-08-19 ENCOUNTER — Encounter: Payer: Self-pay | Admitting: Family

## 2016-08-20 MED ORDER — TRAMADOL HCL 50 MG PO TABS: 50.0000 mg | ORAL_TABLET | Freq: Three times a day (TID) | ORAL | 0 refills | Status: DC | PRN

## 2016-08-21 ENCOUNTER — Other Ambulatory Visit (INDEPENDENT_AMBULATORY_CARE_PROVIDER_SITE_OTHER): Payer: 59

## 2016-08-21 ENCOUNTER — Encounter: Payer: Self-pay | Admitting: Family

## 2016-08-21 DIAGNOSIS — R945 Abnormal results of liver function studies: Principal | ICD-10-CM

## 2016-08-21 DIAGNOSIS — R7989 Other specified abnormal findings of blood chemistry: Secondary | ICD-10-CM

## 2016-08-21 LAB — HEPATIC FUNCTION PANEL
ALK PHOS: 93 U/L (ref 39–117)
ALT: 40 U/L (ref 0–53)
AST: 26 U/L (ref 0–37)
Albumin: 4.3 g/dL (ref 3.5–5.2)
BILIRUBIN DIRECT: 0 mg/dL (ref 0.0–0.3)
BILIRUBIN TOTAL: 0.3 mg/dL (ref 0.2–1.2)
TOTAL PROTEIN: 6.6 g/dL (ref 6.0–8.3)

## 2016-08-24 ENCOUNTER — Other Ambulatory Visit: Payer: Self-pay | Admitting: Family

## 2016-08-28 ENCOUNTER — Ambulatory Visit: Payer: 59 | Admitting: Psychology

## 2016-09-10 ENCOUNTER — Ambulatory Visit (INDEPENDENT_AMBULATORY_CARE_PROVIDER_SITE_OTHER): Payer: 59 | Admitting: Psychology

## 2016-09-10 DIAGNOSIS — F411 Generalized anxiety disorder: Secondary | ICD-10-CM

## 2016-09-21 ENCOUNTER — Other Ambulatory Visit: Payer: Self-pay | Admitting: Family

## 2016-09-25 ENCOUNTER — Ambulatory Visit: Payer: 59 | Admitting: Psychology

## 2016-10-05 ENCOUNTER — Other Ambulatory Visit: Payer: Self-pay | Admitting: Family

## 2016-10-07 ENCOUNTER — Other Ambulatory Visit: Payer: Self-pay | Admitting: Family

## 2016-10-07 ENCOUNTER — Encounter: Payer: Self-pay | Admitting: Family

## 2016-10-07 MED ORDER — TRAMADOL HCL 50 MG PO TABS: 50.0000 mg | ORAL_TABLET | Freq: Three times a day (TID) | ORAL | 0 refills | Status: DC | PRN

## 2016-10-07 NOTE — Telephone Encounter (Signed)
Patient is requesting to change to Dr. Lawerance Bach as his wife is a patient of hers.

## 2016-10-21 ENCOUNTER — Ambulatory Visit: Payer: 59 | Attending: Nurse Practitioner | Admitting: Occupational Therapy

## 2016-10-21 DIAGNOSIS — M25612 Stiffness of left shoulder, not elsewhere classified: Secondary | ICD-10-CM | POA: Diagnosis present

## 2016-10-21 DIAGNOSIS — R278 Other lack of coordination: Secondary | ICD-10-CM | POA: Insufficient documentation

## 2016-10-21 DIAGNOSIS — R2681 Unsteadiness on feet: Secondary | ICD-10-CM | POA: Insufficient documentation

## 2016-10-21 DIAGNOSIS — M6281 Muscle weakness (generalized): Secondary | ICD-10-CM | POA: Diagnosis present

## 2016-10-21 DIAGNOSIS — R208 Other disturbances of skin sensation: Secondary | ICD-10-CM | POA: Insufficient documentation

## 2016-10-21 NOTE — Therapy (Signed)
[facility] [address] [place], [place], [address] Phone: [phone]   Fax:  [phone]  Occupational Therapy Evaluation  Patient Details  Name: Larry Riley MRN: [id number] Date of Birth: 04/11/1980 Referring Provider: Elijio Miles, NP  Encounter Date: 10/21/2016      OT End of Session - 10/21/16 2118    Visit Number 1   Number of Visits 17   Date for OT Re-Evaluation 12/20/16   Authorization Type UHC--awaiting verification   OT Start Time 1320   OT Stop Time 1405   OT Time Calculation (min) 45 min   Activity Tolerance Patient tolerated treatment well   Behavior During Therapy [facility] for tasks assessed/performed      Past Medical History:  Diagnosis Date  . MRSA (methicillin resistant Staphylococcus aureus) 2005   leg  . Neurofibromatosis    tumor down spine and brain    Past Surgical History:  Procedure Laterality Date  . hydrocephalus     shunt 1999-[place]  . NECK SURGERY      tumor removal  . SPINE SURGERY  10/2013   c2-t2 fusion  , laminectomy c1-6-- Dr Raynald Kemp-- [place]  . VASECTOMY  02/2013    There were no vitals filed for this visit.      Subjective Assessment - 10/21/16 1323    Subjective  Pt reports that he gets neck cramp/headach after reading approx   Patient is accompained by: Family member  wife   Pertinent History debulking of cervical spine tumor/Cervical athrodesisdiagnosed at young age with neurofibromatosis, Horners Syndrome, and hydrocephalus, numerous cervical surgeries over the years (6-7)   Limitations fall risk, no lifting >8lbs (gallon of milk), avoid overhead lifting, no looking up/down with neck, driving    Patient Stated Goals improved coordination/strength LUE, improve foot drop   Currently in Pain? Yes   Pain Score 5   baseline, constant   Pain Location Neck   Pain Orientation Medial   Pain Descriptors / Indicators Aching;Throbbing   Pain Type  Chronic pain   Pain Onset More than a month ago   Pain Frequency Constant   Aggravating Factors  improper positioning   Pain Relieving Factors pain meds, rest   Effect of Pain on Daily Activities OT will not directly address due to chronic nature, but will monitor as relates to treatment           [facility] OT Assessment - 10/21/16 0001      Assessment   Diagnosis cervical athrodesis, debulking of cervical spine tumor  hx of neurofibromatosis type 1   Referring Provider Elijio Miles, NP   Onset Date 09/30/16  symptoms began approx 6 months prior   Assessment hospitalized for 4 days   Prior Therapy OT/PT after last surgery in 2015     Precautions   Precautions Fall   Precaution Comments no lifting >8lbs (low-range), no driving, no looking up/down, no overhead lifting     Balance Screen   Has the patient fallen in the past 6 months No  none since surgery, a couple prior to surgery     Prior Function   Level of Independence Independent   Vocation --  Building surveyor, slower tying now-typed w/ 3 fingers/hand   Vocation Requirements typing--types slower now (used 3 fingers on each hand prior)     ADL   Eating/Feeding Modified independent   Grooming Modified independent   Upper Body Bathing --  min-mod A, wife assists with hair/back  Lower Body Bathing Modified independent   Upper Body Dressing --  mod I, difficulty with buttoning    Lower Body Dressing --  needs assist for tying and sometimes donning shoes/socks   Toilet Transfer Modified independent   Toileting - Clothing Manipulation Modified independent   Toileting -  Hygiene Increase time   Tub/Shower Transfer Modified independent   Hydrologist tub bench   ADL comments needs assist with open packages     IADL   Prior Level of Function Shopping puts away lighter groceries, using grocery pick-up   Prior Level of Function Light Housekeeping loading top rack of dishwasher,  wiping down surfaces   Prior Level of Function Meal Prep difficulty with higher shelf or low, unable to carry pots/pans, has not attempted peel, difficulty opening jars      Mobility   Mobility Status History of falls;Independent  L foot drop   Mobility Status Comments Pt reports that surgeon wanted to see him again before ordering PT for foot drop     Written Expression   Dominant Hand Right   Handwriting --  WFL/at baseline per pt report     Cognition   Overall Cognitive Status Within Functional Limits for tasks assessed     Observation/Other Assessments   Outcome Measures Fastening/unfastening 3 buttons in 64.94sec     Sensation   Light Touch Impaired by gross assessment  decr discrimination    Hot/Cold Appears Intact  per pt report   Proprioception Impaired by gross assessment     Coordination   Gross Motor Movements are Fluid and Coordinated No  with LUE   Fine Motor Movements are Fluid and Coordinated No  difficulty, with opposition to 4-5 digits L hand   9 Hole Peg Test Right;Left   Right 9 Hole Peg Test 36.84   Left 9 Hole Peg Test 62.06     ROM / Strength   AROM / PROM / Strength AROM;Strength     AROM   Overall AROM  Deficits   Overall AROM Comments approx 90% shoulder ROM      Strength   Overall Strength Deficits  did not formally assess proximal strength due to precautions     Hand Function   Right Hand Grip (lbs) 85   Right Hand Lateral Pinch 19 lbs   Right Hand 3 Point Pinch 10 lbs  tip pinch   Left Hand Grip (lbs) 26   Left Hand Lateral Pinch 9.5 lbs   Left 3 point pinch 4.5 lbs  tip pinch                         OT Education - 10/21/16 2158    Education provided Yes   Education Details OT eval results/POC, foot drop will be addressed by PT prn   Person(s) Educated Patient;Spouse   Methods Explanation   Comprehension Verbalized understanding          OT Short Term Goals - 10/21/16 2144      OT SHORT TERM GOAL #1    Title Pt will be I with HEP - check STGs 11/20/16   Time 4   Period Weeks   Status New     OT SHORT TERM GOAL #2   Title Pt will demonstrate improved coordination for ADLs as evidenced by decreasing time on 9-hole peg test by at least 10sec with LUE.   Baseline 62.06sec   Time 4   Period Weeks  Status New     OT SHORT TERM GOAL #3   Title Pt will perform bathing mod I.   Time 4   Period Weeks   Status New     OT SHORT TERM GOAL #4   Title Pt will be mod I with tying shoes with AE prn   Time 4   Period Weeks   Status New     OT SHORT TERM GOAL #5   Title Pt will improve L hand grip strength by at least 6lbs to assist with opening containers.   Baseline L-26lbs   Time 4   Status New           OT Long Term Goals - 10/21/16 2147      OT LONG TERM GOAL #1   Title Pt will be I with upgraded HEP prn - check LTGs 12/20/16   Time 8   Period Weeks   Status New     OT LONG TERM GOAL #2   Title Pt will demonstrate improved coordination for ADLs as evidenced by decreasing time on 9-hole peg test by at least 10sec with LUE.   Time 8   Period Weeks   Status New     OT LONG TERM GOAL #3   Title Pt will perform previous light cooking/cleaning tasks mod I.   Time 8   Period Weeks   Status New     OT LONG TERM GOAL #4   Title Patient will be able to type with both hands within reasonable period of time in preparation to returning to work.   Time 8   Period Weeks   Status New     OT LONG TERM GOAL #5   Title Pt will improve L hand grip strength by at least 12lbs to assist with opening containers.   Baseline 26lbs   Time 8   Period Weeks   Status New     Long Term Additional Goals   Additional Long Term Goals Yes     OT LONG TERM GOAL #6   Title Pt will improve ability to fasten buttons as shown by being able to fasten/unfasten 3 buttons in less than 50sec.   Baseline 64.94   Time 8   Period Weeks   Status New               Plan - 10/21/16 2124     Clinical Impression Statement Pt presents with decr coordination, decr strength, decr LUE ROM, decr sensation, decr LUE functional use, decr balance/functional mobility for ADLs/IADLs.  Pt would benefit from occupational therapy to address these deficits to improve LUE functional use, and ADL/IADL performance to incr independence.    Occupational Profile and client history currently impacting functional performance Pt is a 36 y.o. male with hx of multiple cervical surgeries due to hx of neurofibromatosis type 1.  Pt with recent debulking of cervical spine tumor surgery/cervical athrodesis 09/30/16.   Pt was hospitalized for 4 days.  Pt was independent prior to recent surgery including working, driving, and IADLs, but was gradually noticing decline in L hand coordination prior to surgery.  Pt also with PMH that includes:  Horners Syndrome, and hydrocephalus with hx of VP shunt.   Pt currently needs assistance with ADLs/IADLs.   Occupational performance deficits (Please refer to evaluation for details): ADL's;IADL's;Work;Leisure   Rehab Potential Good   Current Impairments/barriers affecting progress: chronic neck pain, hx of multiple cervical surgeries   OT Frequency 2x / week  OT Duration 8 weeks  +eval   OT Treatment/Interventions Cryotherapy;Parrafin;Therapeutic exercise;DME and/or AE instruction;Building services engineer;Therapeutic activities;Patient/family education;Manual Therapy;Neuromuscular education;Fluidtherapy;Self-care/ADL training;Energy conservation;Passive range of motion;Therapeutic exercises;Moist Heat   Plan send note to MD prior to appt    Clinical Decision Making Several treatment options, min-mod task modification necessary   Recommended Other Services Pt reports that surgeon wanted to see him again prior to ordering PT--appt in approx 2 weeks   Consulted and Agree with Plan of Care Patient;Family member/caregiver   Family Member Consulted wife      Patient will benefit from  skilled therapeutic intervention in order to improve the following deficits and impairments:  Decreased activity tolerance, Decreased coordination, Decreased knowledge of use of DME, Decreased strength, Impaired UE functional use, Pain, Decreased range of motion, Decreased balance, Decreased mobility, Impaired sensation  Visit Diagnosis: Muscle weakness (generalized)  Other lack of coordination  Other disturbances of skin sensation  Unsteadiness on feet  Stiffness of left shoulder, not elsewhere classified    Problem List Patient Active Problem List   Diagnosis Date Noted  . Stress due to illness of family member 06/10/2016  . Syncope 04/23/2016  . Onychomycosis 04/23/2016  . Cerumen impaction 04/23/2016  . Routine general medical examination at a health care facility 06/13/2014  . Constipation due to opioid therapy 06/13/2014  . Chronic pain syndrome 02/21/2014  . Allergic reaction 02/20/2014  . Neurofibromatosis, type 1 (HCC) 12/14/2013  . Preventative health care 06/23/2010  . HORNER'S SYNDROME 04/12/2009    [facility] 10/21/2016, 9:59 PM  [facility] [address] [place], [place], [address] Phone: [phone]   Fax:  [phone]  Name: Esco Joslyn MRN: [id number] Date of Birth: 12-Feb-1980   Willa Frater, OTR/L [facility] [address] [place], [place]  [address] [phone] phone [phone] 10/21/16 10:00 PM

## 2016-10-22 ENCOUNTER — Other Ambulatory Visit: Payer: Self-pay | Admitting: Family

## 2016-10-22 ENCOUNTER — Ambulatory Visit (INDEPENDENT_AMBULATORY_CARE_PROVIDER_SITE_OTHER): Payer: 59 | Admitting: Psychology

## 2016-10-22 DIAGNOSIS — F411 Generalized anxiety disorder: Secondary | ICD-10-CM | POA: Diagnosis not present

## 2016-11-02 ENCOUNTER — Ambulatory Visit: Payer: 59 | Admitting: Occupational Therapy

## 2016-11-02 DIAGNOSIS — M6281 Muscle weakness (generalized): Secondary | ICD-10-CM

## 2016-11-02 DIAGNOSIS — M25612 Stiffness of left shoulder, not elsewhere classified: Secondary | ICD-10-CM

## 2016-11-02 DIAGNOSIS — R208 Other disturbances of skin sensation: Secondary | ICD-10-CM

## 2016-11-02 DIAGNOSIS — R2681 Unsteadiness on feet: Secondary | ICD-10-CM

## 2016-11-02 DIAGNOSIS — R278 Other lack of coordination: Secondary | ICD-10-CM

## 2016-11-02 NOTE — Therapy (Signed)
[facility] [address] [place], [place], [address] Phone: [phone]   Fax:  [phone]  Occupational Therapy Treatment  Patient Details  Name: Larry Riley MRN: [id number] Date of Birth: [birth year] Referring Provider: Elijio Miles, NP  Encounter Date: 11/02/2016      OT End of Session - 11/02/16 1017    Visit Number 2   Number of Visits 17   Date for OT Re-Evaluation 12/20/16   Authorization Type UHC--no visit limit PT/OT (20 visit limit for cognitive therapy)   OT Start Time 1018   OT Stop Time 1100   OT Time Calculation (min) 42 min   Activity Tolerance Patient tolerated treatment well   Behavior During Therapy [facility] for tasks assessed/performed      Past Medical History:  Diagnosis Date  . MRSA (methicillin resistant Staphylococcus aureus) 2005   leg  . Neurofibromatosis    tumor down spine and brain    Past Surgical History:  Procedure Laterality Date  . hydrocephalus     shunt 1999[place]  . NECK SURGERY      tumor removal  . SPINE SURGERY  10/2013   c2-t2 fusion  , laminectomy c1-6-- Dr Raynald Kemp-- [place]  . VASECTOMY  02/2013    There were no vitals filed for this visit.      Subjective Assessment - 11/02/16 1020    Subjective  Pt reports that he gets neck cramp/headach after reading approx   Patient is accompained by: Family member  wife   Pertinent History debulking of cervical spine tumor/Cervical athrodesisdiagnosed at young age with neurofibromatosis, Horners Syndrome, and hydrocephalus, numerous cervical surgeries over the years (6-7)   Limitations fall risk, no lifting >8lbs (gallon of milk), avoid overhead lifting, no looking up/down with neck, driving    Patient Stated Goals improved coordination/strength LUE, improve foot drop   Pain Score 4    Pain Location Neck   Pain Descriptors / Indicators Aching;Throbbing   Pain Onset More than a month ago   Pain  Frequency Constant   Aggravating Factors  improper positioning    Pain Relieving Factors pain meds, rest                              OT Education - 11/02/16 1045    Education Details HEP for coordination and yellow putty--see pt instructions; Note to take to MD next week to clarify about remaining precautions   Person(s) Educated Patient   Methods Explanation;Demonstration;Verbal cues;Handout   Comprehension Verbalized understanding;Returned demonstration          OT Short Term Goals - 10/21/16 2144      OT SHORT TERM GOAL #1   Title Pt will be I with HEP - check STGs 11/20/16   Time 4   Period Weeks   Status New     OT SHORT TERM GOAL #2   Title Pt will demonstrate improved coordination for ADLs as evidenced by decreasing time on 9-hole peg test by at least 10sec with LUE.   Baseline 62.06sec   Time 4   Period Weeks   Status New     OT SHORT TERM GOAL #3   Title Pt will perform bathing mod I.   Time 4   Period Weeks   Status New     OT SHORT TERM GOAL #4   Title Pt will be mod I with tying shoes with AE prn  Time 4   Period Weeks   Status New     OT SHORT TERM GOAL #5   Title Pt will improve L hand grip strength by at least 6lbs to assist with opening containers.   Baseline L-26lbs   Time 4   Status New           OT Long Term Goals - 10/21/16 2147      OT LONG TERM GOAL #1   Title Pt will be I with upgraded HEP prn - check LTGs 12/20/16   Time 8   Period Weeks   Status New     OT LONG TERM GOAL #2   Title Pt will demonstrate improved coordination for ADLs as evidenced by decreasing time on 9-hole peg test by at least 10sec with LUE.   Time 8   Period Weeks   Status New     OT LONG TERM GOAL #3   Title Pt will perform previous light cooking/cleaning tasks mod I.   Time 8   Period Weeks   Status New     OT LONG TERM GOAL #4   Title Patient will be able to type with both hands within reasonable period of time in  preparation to returning to work.   Time 8   Period Weeks   Status New     OT LONG TERM GOAL #5   Title Pt will improve L hand grip strength by at least 12lbs to assist with opening containers.   Baseline 26lbs   Time 8   Period Weeks   Status New     Long Term Additional Goals   Additional Long Term Goals Yes     OT LONG TERM GOAL #6   Title Pt will improve ability to fasten buttons as shown by being able to fasten/unfasten 3 buttons in less than 50sec.   Baseline 64.94   Time 8   Period Weeks   Status New               Plan - 11/02/16 1023    Clinical Impression Statement Pt is progressing towards goals and verbalizes understanding of coordination/putty HEP.  Also gave pt note to give to MD next week to clarify remaining precautions.   Rehab Potential Good   Current Impairments/barriers affecting progress: chronic neck pain, hx of multiple cervical surgeries   OT Frequency 2x / week   OT Duration 8 weeks  +eval   OT Treatment/Interventions Cryotherapy;Parrafin;Therapeutic exercise;DME and/or AE instruction;Building services engineer;Therapeutic activities;Patient/family education;Manual Therapy;Neuromuscular education;Fluidtherapy;Self-care/ADL training;Energy conservation;Passive range of motion;Therapeutic exercises;Moist Heat   Plan remind pt to take note to MD next week; continue with coordination and L hand strength, practice typing   OT Home Exercise Plan Education provided:  yellow putty HEP; coordination HEP   Consulted and Agree with Plan of Care Patient;Family member/caregiver   Family Member Consulted wife      Patient will benefit from skilled therapeutic intervention in order to improve the following deficits and impairments:  Decreased activity tolerance, Decreased coordination, Decreased knowledge of use of DME, Decreased strength, Impaired UE functional use, Pain, Decreased range of motion, Decreased balance, Decreased mobility, Impaired  sensation  Visit Diagnosis: Muscle weakness (generalized)  Other lack of coordination  Other disturbances of skin sensation  Stiffness of left shoulder, not elsewhere classified  Unsteadiness on feet    Problem List Patient Active Problem List   Diagnosis Date Noted  . Stress due to illness of family member 06/10/2016  . Syncope  04/23/2016  . Onychomycosis 04/23/2016  . Cerumen impaction 04/23/2016  . Routine general medical examination at a health care facility 06/13/2014  . Constipation due to opioid therapy 06/13/2014  . Chronic pain syndrome 02/21/2014  . Allergic reaction 02/20/2014  . Neurofibromatosis, type 1 (HCC) 12/14/2013  . Preventative health care 06/23/2010  . HORNER'S SYNDROME 04/12/2009    [facility] Center[facility]REEMAN,Presleigh Feldstein 11/02/2016, 11:00 AM   [facility] [address] [place], [place], [address] Phone: [phone]   Fax:  [phone]  Name: Sandrea HammondJoshua Riley MRN: [id number] Date of Birth: Apr 09, 1980   Willa FraterAngela Lavonia Eager, OTR/L [facility] [address] [place], [place]  [address] [phone] phone [phone] 11/02/16 11:00 AM

## 2016-11-02 NOTE — Patient Instructions (Addendum)
  Dr.  Mindi JunkerWesley Hsu,  Please clarify remaining precautions for [name].  1.  Do you feel that pt can participate in physical therapy now for mobility/balance and LLE strength?  If so please send referral for physical therapy.  2.  Should pt still limit looking up/down?  3.  What is pt's current lifting restriction?  4.  Does pt need to limit overhead reaching at this point?   Please feel free to contact me with questions/concerns.  Thank you,  Willa FraterAngela Steve Gregg, OTR/L [facility] [address] [place], [place]  [address] [phone] phone [phone]  fax 11/02/16 10:36 AM                             Coordination Activities  Perform the following activities for 20 minutes 1-2 times per day with left hand(s).   Rotate ball in fingertips (clockwise and counter-clockwise).  Flip cards 1 at a time by turning palm.  Deal cards with your thumb (Hold deck in hand and push card off top with thumb).  Pick up coins, buttons, marbles, dried beans/pasta, paperclips, beads, etc. of different sizes and place in container.  Pick up coins and place in container or coin bank.  Pick up coins and stack.  Pick up coins one at a time until you get 5-10 in your hand, then move coins from palm to fingertips to place in container/coin bank one at a time.  Flip card between fingers.    1. Grip Strengthening (Resistive Putty)   Squeeze putty using thumb and all fingers. Repeat 15-20 times. Do 2 sessions per day.   Extension (Assistive Putty)   Roll putty back and forth, being sure to use all fingertips. Repeat 3 times. Do 2 sessions per day.  Then pinch as below.   Palmar Pinch Strengthening (Resistive Putty)   Pinch putty between thumb and each fingertip in turn after rolling out    MP Flexion (Resistive Putty)   Bending only at large knuckles, press putty down against thumb. Keep fingertips straight. Repeat  10 times. Do 2 sessions per day.     Lateral Pinch Strengthening (Resistive Putty)    Squeeze between thumb and side of each finger in turn. Repeat 10 times. Do 2 sessions per day.

## 2016-11-04 ENCOUNTER — Ambulatory Visit: Payer: 59 | Admitting: Occupational Therapy

## 2016-11-04 DIAGNOSIS — M25612 Stiffness of left shoulder, not elsewhere classified: Secondary | ICD-10-CM

## 2016-11-04 DIAGNOSIS — R208 Other disturbances of skin sensation: Secondary | ICD-10-CM

## 2016-11-04 DIAGNOSIS — M6281 Muscle weakness (generalized): Secondary | ICD-10-CM | POA: Diagnosis not present

## 2016-11-04 DIAGNOSIS — R278 Other lack of coordination: Secondary | ICD-10-CM

## 2016-11-04 NOTE — Therapy (Signed)
[facility] [address] [place], [place], [address] Phone: [phone]   Fax:  [phone]  Occupational Therapy Treatment  Patient Details  Name: Larry Riley MRN: [id number] Date of Birth: 25-Jun-1980 Referring Provider: Elijio Miles, NP  Encounter Date: 11/04/2016      OT End of Session - 11/04/16 0920    Visit Number 3   Number of Visits 17   Date for OT Re-Evaluation 12/20/16   Authorization Type UHC--no visit limit PT/OT (20 visit limit for cognitive therapy)   OT Start Time 0850   OT Stop Time 0930   OT Time Calculation (min) 40 min   Activity Tolerance Patient tolerated treatment well   Behavior During Therapy [facility] for tasks assessed/performed      Past Medical History:  Diagnosis Date  . MRSA (methicillin resistant Staphylococcus aureus) 2005   leg  . Neurofibromatosis    tumor down spine and brain    Past Surgical History:  Procedure Laterality Date  . hydrocephalus     [place]unt 1999[place]  . NECK SURGERY      tumor removal  . SPINE SURGERY  10/2013   c2-t2 fusion  , laminectomy c1-6-- Dr Raynald Kemp-- [place]  . VASECTOMY  02/2013    There were no vitals filed for this visit.      Subjective Assessment - 11/04/16 0859    Pertinent History debulking of cervical spine tumor/Cervical athrodesisdiagnosed at young age with neurofibromatosis, Horners Syndrome, and hydrocephalus, numerous cervical surgeries over the years (6-7)   Limitations fall risk, no lifting >8lbs (gallon of milk), avoid overhead lifting, no looking up/down with neck, driving    Patient Stated Goals improved coordination/strength LUE, improve foot drop   Currently in Pain? Yes   Pain Score 6    Pain Location Neck   Pain Orientation Medial   Pain Descriptors / Indicators Aching   Pain Type Chronic pain   Pain Onset More than a month ago   Pain Frequency Constant   Aggravating Factors  improper positioning   Pain  Relieving Factors pain meds, rest              Treatment: Typing test 18 wpm, 78% accuracy Typing activity wordtris for increased speed and accuracy. Fine motor coordination activities: picking up and stacking coins, manipulating in hand rotating ball min-mod difficulty Copying small peg design with LUE then removing pegs with left hand with in hand manipulation, min-mod difficulty/ v.c Arm bike x 5 mins level 1 for conditioning                  OT Short Term Goals - 10/21/16 2144      OT SHORT TERM GOAL #1   Title Pt will be I with HEP - check STGs 11/20/16   Time 4   Period Weeks   Status New     OT SHORT TERM GOAL #2   Title Pt will demonstrate improved coordination for ADLs as evidenced by decreasing time on 9-hole peg test by at least 10sec with LUE.   Baseline 62.06sec   Time 4   Period Weeks   Status New     OT SHORT TERM GOAL #3   Title Pt will perform bathing mod I.   Time 4   Period Weeks   Status New     OT SHORT TERM GOAL #4   Title Pt will be mod I with tying shoes with AE prn   Time 4  Period Weeks   Status New     OT SHORT TERM GOAL #5   Title Pt will improve L hand grip strength by at least 6lbs to assist with opening containers.   Baseline L-26lbs   Time 4   Status New           OT Long Term Goals - 10/21/16 2147      OT LONG TERM GOAL #1   Title Pt will be I with upgraded HEP prn - check LTGs 12/20/16   Time 8   Period Weeks   Status New     OT LONG TERM GOAL #2   Title Pt will demonstrate improved coordination for ADLs as evidenced by decreasing time on 9-hole peg test by at least 10sec with LUE.   Time 8   Period Weeks   Status New     OT LONG TERM GOAL #3   Title Pt will perform previous light cooking/cleaning tasks mod I.   Time 8   Period Weeks   Status New     OT LONG TERM GOAL #4   Title Patient will be able to type with both hands within reasonable period of time in preparation to returning to work.    Time 8   Period Weeks   Status New     OT LONG TERM GOAL #5   Title Pt will improve L hand grip strength by at least 12lbs to assist with opening containers.   Baseline 26lbs   Time 8   Period Weeks   Status New     Long Term Additional Goals   Additional Long Term Goals Yes     OT LONG TERM GOAL #6   Title Pt will improve ability to fasten buttons as shown by being able to fasten/unfasten 3 buttons in less than 50sec.   Baseline 64.94   Time 8   Period Weeks   Status New               Plan - 11/04/16 [id number]    Clinical Impression Statement Pt is progressing towards goals for fine motor coordination.   Rehab Potential Good   Current Impairments/barriers affecting progress: chronic neck pain, hx of multiple cervical surgeries   OT Frequency 2x / week   OT Duration 8 weeks   OT Treatment/Interventions Cryotherapy;Parrafin;Therapeutic exercise;DME and/or AE instruction;Building services engineerunctional Mobility Training;Therapeutic activities;Patient/family education;Manual Therapy;Neuromuscular education;Fluidtherapy;Self-care/ADL training;Energy conservation;Passive range of motion;Therapeutic exercises;Moist Heat   Plan continue to address LUE strength and coordination      Patient will benefit from skilled therapeutic intervention in order to improve the following deficits and impairments:  Decreased activity tolerance, Decreased coordination, Decreased knowledge of use of DME, Decreased strength, Impaired UE functional use, Pain, Decreased range of motion, Decreased balance, Decreased mobility, Impaired sensation  Visit Diagnosis: Muscle weakness (generalized)  Other lack of coordination  Other disturbances of skin sensation  Stiffness of left shoulder, not elsewhere classified    Problem List Patient Active Problem List   Diagnosis Date Noted  . Stress due to illness of family member 06/10/2016  . Syncope 04/23/2016  . Onychomycosis 04/23/2016  . Cerumen impaction 04/23/2016  .  Routine general medical examination at a health care facility 06/13/2014  . Constipation due to opioid therapy 06/13/2014  . Chronic pain syndrome 02/21/2014  . Allergic reaction 02/20/2014  . Neurofibromatosis, type 1 (HCC) 12/14/2013  . Preventative health care 06/23/2010  . HORNER'S SYNDROME 04/12/2009    Danniella Robben 11/04/2016, 9:24 AM  [place]  [facility] [address] [place], [place], [address] Phone: [phone]   Fax:  [phone]  Name: Larry Riley MRN: [id number] Date of Birth: 02/02/1980

## 2016-11-10 ENCOUNTER — Ambulatory Visit: Payer: 59 | Admitting: Occupational Therapy

## 2016-11-10 DIAGNOSIS — R278 Other lack of coordination: Secondary | ICD-10-CM

## 2016-11-10 DIAGNOSIS — M6281 Muscle weakness (generalized): Secondary | ICD-10-CM

## 2016-11-10 DIAGNOSIS — R208 Other disturbances of skin sensation: Secondary | ICD-10-CM

## 2016-11-10 NOTE — Therapy (Signed)
[facility] [address] [place], [place], [address] Phone: [phone]   Fax:  [phone]  Occupational Therapy Treatment  Patient Details  Name: Larry HammondJoshua Riley MRN: [id number] Date of Birth: 09-08-1980 Referring Provider: Elijio Milesabitha Michelle Hollins, NP  Encounter Date: 11/10/2016      OT End of Session - 11/10/16 1341    Visit Number 4   Number of Visits 17   Date for OT Re-Evaluation 12/20/16   Authorization Type UHC--no visit limit PT/OT (20 visit limit for cognitive therapy)   OT Start Time 1318   OT Stop Time 1400   OT Time Calculation (min) 42 min      Past Medical History:  Diagnosis Date  . MRSA (methicillin resistant Staphylococcus aureus) 2005   leg  . Neurofibromatosis    tumor down spine and brain    Past Surgical History:  Procedure Laterality Date  . hydrocephalus     [place]unt 1999[place]  . NECK SURGERY      tumor removal  . SPINE SURGERY  10/2013   c2-t2 fusion  , laminectomy c1-6-- [facility]-- [place]  . VASECTOMY  02/2013    There were no vitals filed for this visit.      Subjective Assessment - 11/10/16 1339    Pertinent History debulking of cervical spine tumor/Cervical athrodesisdiagnosed at young age with neurofibromatosis, Horners Syndrome, and hydrocephalus, numerous cervical surgeries over the years (6-7)   Limitations fall risk, no lifting >8lbs (gallon of milk), avoid overhead lifting, no looking up/down with neck, driving    Patient Stated Goals improved coordination/strength LUE, improve foot drop   Currently in Pain? Yes   Pain Score 5    Pain Location Back   Pain Orientation Medial   Pain Descriptors / Indicators Aching   Pain Type Chronic pain   Pain Onset More than a month ago   Pain Frequency Constant   Aggravating Factors  malpositioning   Pain Relieving Factors pain meds        Treatment: Upgraded putty HEP to red, pt returned demonstration of exercises for  grip and pinch. Functional low to mid range reaching to place/ remove graded clothespins from vertical antennae, min difficulty with LUE. Placing then removing grooved pegs with in hand manipulation, min difficulty/ drops with LUE. Arm bike x 8 mins level 1 for conditioning Typing activity for increased typing speed and accuracy, min v.c                        OT Short Term Goals - 10/21/16 2144      OT SHORT TERM GOAL #1   Title Pt will be I with HEP - check STGs 11/20/16   Time 4   Period Weeks   Status New     OT SHORT TERM GOAL #2   Title Pt will demonstrate improved coordination for ADLs as evidenced by decreasing time on 9-hole peg test by at least 10sec with LUE.   Baseline 62.06sec   Time 4   Period Weeks   Status New     OT SHORT TERM GOAL #3   Title Pt will perform bathing mod I.   Time 4   Period Weeks   Status New     OT SHORT TERM GOAL #4   Title Pt will be mod I with tying shoes with AE prn   Time 4   Period Weeks   Status New     OT SHORT  TERM GOAL #5   Title Pt will improve L hand grip strength by at least 6lbs to assist with opening containers.   Baseline L-26lbs   Time 4   Status New           OT Long Term Goals - 10/21/16 2147      OT LONG TERM GOAL #1   Title Pt will be I with upgraded HEP prn - check LTGs 12/20/16   Time 8   Period Weeks   Status New     OT LONG TERM GOAL #2   Title Pt will demonstrate improved coordination for ADLs as evidenced by decreasing time on 9-hole peg test by at least 10sec with LUE.   Time 8   Period Weeks   Status New     OT LONG TERM GOAL #3   Title Pt will perform previous light cooking/cleaning tasks mod I.   Time 8   Period Weeks   Status New     OT LONG TERM GOAL #4   Title Patient will be able to type with both hands within reasonable period of time in preparation to returning to work.   Time 8   Period Weeks   Status New     OT LONG TERM GOAL #5   Title Pt will improve L  hand grip strength by at least 12lbs to assist with opening containers.   Baseline 26lbs   Time 8   Period Weeks   Status New     Long Term Additional Goals   Additional Long Term Goals Yes     OT LONG TERM GOAL #6   Title Pt will improve ability to fasten buttons as shown by being able to fasten/unfasten 3 buttons in less than 50sec.   Baseline 64.94   Time 8   Period Weeks   Status New               Plan - 11/10/16 1343    Clinical Impression Statement Pt is progressing towards goals. He does not see MD until next week, he will pursue clarification of precautions at that time.   Rehab Potential Good   Current Impairments/barriers affecting progress: chronic neck pain, hx of multiple cervical surgeries   OT Frequency 2x / week   OT Duration 8 weeks   OT Treatment/Interventions Cryotherapy;Parrafin;Therapeutic exercise;DME and/or AE instruction;Building services engineer;Therapeutic activities;Patient/family education;Manual Therapy;Neuromuscular education;Fluidtherapy;Self-care/ADL training;Energy conservation;Passive range of motion;Therapeutic exercises;Moist Heat   Plan continue to address LUE strength and coordination   OT Home Exercise Plan Education provided:  yellow putty HEP; coordination HEP   Consulted and Agree with Plan of Care Patient      Patient will benefit from skilled therapeutic intervention in order to improve the following deficits and impairments:  Decreased activity tolerance, Decreased coordination, Decreased knowledge of use of DME, Decreased strength, Impaired UE functional use, Pain, Decreased range of motion, Decreased balance, Decreased mobility, Impaired sensation  Visit Diagnosis: Muscle weakness (generalized)  Other lack of coordination  Other disturbances of skin sensation    Problem List Patient Active Problem List   Diagnosis Date Noted  . Stress due to illness of family member 06/10/2016  . Syncope 04/23/2016  .  Onychomycosis 04/23/2016  . Cerumen impaction 04/23/2016  . Routine general medical examination at a health care facility 06/13/2014  . Constipation due to opioid therapy 06/13/2014  . Chronic pain syndrome 02/21/2014  . Allergic reaction 02/20/2014  . Neurofibromatosis, type 1 (HCC) 12/14/2013  . Preventative health  care 06/23/2010  . HORNER'S SYNDROME 04/12/2009    Larry Riley 11/10/2016, 1:51 PM   [facility] [address] [place], [place], [address] Phone: [phone]   Fax:  [phone]  Name: Larry Riley MRN: [id number] Date of Birth: 1980/04/14

## 2016-11-12 ENCOUNTER — Ambulatory Visit: Payer: 59 | Attending: Nurse Practitioner | Admitting: Occupational Therapy

## 2016-11-12 DIAGNOSIS — M6281 Muscle weakness (generalized): Secondary | ICD-10-CM | POA: Insufficient documentation

## 2016-11-12 DIAGNOSIS — R208 Other disturbances of skin sensation: Secondary | ICD-10-CM | POA: Insufficient documentation

## 2016-11-12 DIAGNOSIS — R293 Abnormal posture: Secondary | ICD-10-CM | POA: Insufficient documentation

## 2016-11-12 DIAGNOSIS — R2689 Other abnormalities of gait and mobility: Secondary | ICD-10-CM | POA: Insufficient documentation

## 2016-11-12 DIAGNOSIS — R278 Other lack of coordination: Secondary | ICD-10-CM | POA: Diagnosis present

## 2016-11-12 DIAGNOSIS — M25612 Stiffness of left shoulder, not elsewhere classified: Secondary | ICD-10-CM | POA: Insufficient documentation

## 2016-11-12 DIAGNOSIS — R2681 Unsteadiness on feet: Secondary | ICD-10-CM | POA: Diagnosis present

## 2016-11-12 DIAGNOSIS — R29898 Other symptoms and signs involving the musculoskeletal system: Secondary | ICD-10-CM | POA: Diagnosis present

## 2016-11-12 NOTE — Therapy (Signed)
[facility] [address] [place], [place], [address] Phone: [phone]   Fax:  [phone]  Occupational Therapy Treatment  Patient Details  Name: Larry Riley MRN: [id number] Date of Birth: [birth year] Referring Provider: Dennison Mascot, NP  Encounter Date: 11/12/2016      OT End of Session - 11/12/16 1320    Visit Number 5   Number of Visits 17   Date for OT Re-Evaluation 12/20/16   Authorization Type UHC--no visit limit PT/OT (20 visit limit for cognitive therapy)   OT Start Time 1318   OT Stop Time 1400   OT Time Calculation (min) 42 min   Activity Tolerance Patient tolerated treatment well   Behavior During Therapy [facility] for tasks assessed/performed      Past Medical History:  Diagnosis Date  . MRSA (methicillin resistant Staphylococcus aureus) 2005   leg  . Neurofibromatosis    tumor down spine and brain    Past Surgical History:  Procedure Laterality Date  . hydrocephalus     [place]unt 1999[place]  . NECK SURGERY      tumor removal  . SPINE SURGERY  10/2013   c2-t2 fusion  , laminectomy c1-6-- Dr Rigoberto Noel-- [place]  . VASECTOMY  02/2013    There were no vitals filed for this visit.      Subjective Assessment - 11/12/16 1319    Subjective  MD appt next Monday.  Difficulty moving things in hand like coins.   Pertinent History debulking of cervical spine tumor/Cervical athrodesisdiagnosed at young age with neurofibromatosis, Horners Syndrome, and hydrocephalus, numerous cervical surgeries over the years (6-7)   Limitations fall risk, no lifting >8lbs (gallon of milk), avoid overhead lifting, no looking up/down with neck, driving    Patient Stated Goals improved coordination/strength LUE, improve foot drop   Currently in Pain? No/denies   Pain Onset More than a month ago      Placing grooved pegs in pegboard for in-hand manipulation with L hand with min difficulty.  Typing practice in prep  for return to work:  Typing L-handed typing words with incr time for typing practice.  Typing test:  93% accuracy and 23 wpm speed, then 87% accuracy and 20wpm speed.  Picking up blocks with gripper set on level 1 (black spring) for sustained grip strength with min-mod difficulty.  Fastening/unfastening buttons with shirt on tabletop with min difficulty/min-mod incr time.  Simulated tying shoes on table with min difficulty.    Completing purdue pegboard with L hand with mod difficulty/incr time.                          OT Short Term Goals - 11/12/16 1346      OT SHORT TERM GOAL #1   Title Pt will be I with HEP - check STGs 11/20/16   Time 4   Period Weeks   Status Achieved  11/12/16 met     OT SHORT TERM GOAL #2   Title Pt will demonstrate improved coordination for ADLs as evidenced by decreasing time on 9-hole peg test by at least 10sec with LUE.   Baseline 62.06sec   Time 4   Period Weeks   Status New     OT SHORT TERM GOAL #3   Title Pt will perform bathing mod I.   Time 4   Period Weeks   Status Achieved  11/12/16  met     OT SHORT TERM GOAL #4  Title Pt will be mod I with tying shoes with AE prn   Time 4   Period Weeks   Status New     OT SHORT TERM GOAL #5   Title Pt will improve L hand grip strength by at least 6lbs to assist with opening containers.   Baseline L-26lbs   Time 4   Status New           OT Long Term Goals - 10/21/16 2147      OT LONG TERM GOAL #1   Title Pt will be I with upgraded HEP prn - check LTGs 12/20/16   Time 8   Period Weeks   Status New     OT LONG TERM GOAL #2   Title Pt will demonstrate improved coordination for ADLs as evidenced by decreasing time on 9-hole peg test by at least 10sec with LUE.   Time 8   Period Weeks   Status New     OT LONG TERM GOAL #3   Title Pt will perform previous light cooking/cleaning tasks mod I.   Time 8   Period Weeks   Status New     OT LONG TERM GOAL #4   Title  Patient will be able to type with both hands within reasonable period of time in preparation to returning to work.   Time 8   Period Weeks   Status New     OT LONG TERM GOAL #5   Title Pt will improve L hand grip strength by at least 12lbs to assist with opening containers.   Baseline 26lbs   Time 8   Period Weeks   Status New     Long Term Additional Goals   Additional Long Term Goals Yes     OT LONG TERM GOAL #6   Title Pt will improve ability to fasten buttons as shown by being able to fasten/unfasten 3 buttons in less than 50sec.   Baseline 64.94   Time 8   Period Weeks   Status New               Plan - 11/12/16 1323    Clinical Impression Statement Pt is progressing towards goals with improving coordination with improving coordination/strength.  Pt does not see MD until next week and will pursue clarification of precautions at that time.   Rehab Potential Good   Current Impairments/barriers affecting progress: chronic neck pain, hx of multiple cervical surgeries   OT Frequency 2x / week   OT Duration 8 weeks   OT Treatment/Interventions Cryotherapy;Parrafin;Therapeutic exercise;DME and/or AE instruction;Therapist, nutritional;Therapeutic activities;Patient/family education;Manual Therapy;Neuromuscular education;Fluidtherapy;Self-care/ADL training;Energy conservation;Passive range of motion;Therapeutic exercises;Moist Heat   Plan continue to address L hand strength and coordination; progress as appropriate if/when precautions lifted   OT Home Exercise Plan Education provided:  yellow putty HEP; coordination HEP   Consulted and Agree with Plan of Care Patient      Patient will benefit from skilled therapeutic intervention in order to improve the following deficits and impairments:  Decreased activity tolerance, Decreased coordination, Decreased knowledge of use of DME, Decreased strength, Impaired UE functional use, Pain, Decreased range of motion, Decreased  balance, Decreased mobility, Impaired sensation  Visit Diagnosis: Muscle weakness (generalized)  Other lack of coordination  Other disturbances of skin sensation  Stiffness of left shoulder, not elsewhere classified  Unsteadiness on feet    Problem List Patient Active Problem List   Diagnosis Date Noted  . Stress due to illness of family member  06/10/2016  . Syncope 04/23/2016  . Onychomycosis 04/23/2016  . Cerumen impaction 04/23/2016  . Routine general medical examination at a health care facility 06/13/2014  . Constipation due to opioid therapy 06/13/2014  . Chronic pain syndrome 02/21/2014  . Allergic reaction 02/20/2014  . Neurofibromatosis, type 1 ([place]) 12/14/2013  . Preventative health care 06/23/2010  . HORNER'S SYNDROME 04/12/2009    [facility] 11/12/2016, 1:58 PM  [facility] [address] [place], [place], [address] Phone: [phone]   Fax:  [phone]  Name: Larry Riley MRN: [id number] Date of Birth: [birth year]   Vianne Bulls, OTR/L [facility] [address] [place], [place]  [address] [phone] phone [phone] 11/12/16 1:58 PM

## 2016-11-16 ENCOUNTER — Other Ambulatory Visit: Payer: Self-pay | Admitting: Family

## 2016-11-16 ENCOUNTER — Ambulatory Visit: Payer: 59 | Admitting: Occupational Therapy

## 2016-11-16 ENCOUNTER — Encounter: Payer: Self-pay | Admitting: Occupational Therapy

## 2016-11-16 DIAGNOSIS — R278 Other lack of coordination: Secondary | ICD-10-CM

## 2016-11-16 DIAGNOSIS — M25612 Stiffness of left shoulder, not elsewhere classified: Secondary | ICD-10-CM

## 2016-11-16 DIAGNOSIS — R2681 Unsteadiness on feet: Secondary | ICD-10-CM

## 2016-11-16 DIAGNOSIS — M6281 Muscle weakness (generalized): Secondary | ICD-10-CM

## 2016-11-16 DIAGNOSIS — R208 Other disturbances of skin sensation: Secondary | ICD-10-CM

## 2016-11-16 NOTE — Patient Instructions (Signed)
Strengthening: Resisted Flexion   Attach tube to door.  Hold tubing with one arm at side. Pull forward and up. Move shoulder through pain-free range of motion. Repeat 10 times per set.  Do 1 sessions per day.    Strengthening: Resisted Extension   Attach one end to door.  Hold tubing in one hand, arm forward. Pull arm back, elbow straight. Repeat 10 times per set. Do 1 sessions per day.   Resisted Horizontal Abduction: Bilateral Belly button level!!  Sit or stand, tubing in both hands, arms out in front. Keeping arms straight, pinch shoulder blades together and stretch arms out. Repeat 10 times per set.  Do 1 sessions per day.   Elbow Flexion: Resisted   Hold tubing wrapped around  One hand and and other end secured under foot, curl arm up as far as possible. Repeat 10 times per set.  Do 1 sessions per day.    Elbow Extension: Resisted   Hold band in left hand with elbow bent. Straighten  other elbow. Repeat 10 times per set.  Do 1 sessions per day.   **Don't hold your breath.  Don't tense up neck.

## 2016-11-16 NOTE — Therapy (Signed)
[facility] [address] [place], [place], [address] Phone: [phone]   Fax:  [phone]  Occupational Therapy Treatment  Patient Details  Name: Larry Riley MRN: [id number] Date of Birth: 1980-05-03 Referring Provider: Dennison Mascot, NP   Encounter Date: 11/16/2016  OT End of Session - 11/16/16 1320    Visit Number  6    Number of Visits  17    Date for OT Re-Evaluation  12/20/16    Authorization Type  UHC--no visit limit PT/OT (20 visit limit for cognitive therapy)    OT Start Time  1324    OT Stop Time  1402    OT Time Calculation (min)  38 min    Activity Tolerance  Patient tolerated treatment well    Behavior During Therapy  [facility] for tasks assessed/performed       Past Medical History:  Diagnosis Date  . MRSA (methicillin resistant Staphylococcus aureus) 2005   leg  . Neurofibromatosis    tumor down spine and brain    Past Surgical History:  Procedure Laterality Date  . hydrocephalus     [place]unt 1999[place]  . NECK SURGERY      tumor removal  . SPINE SURGERY  10/2013   c2-t2 fusion  , laminectomy c1-6-- Dr Rigoberto Noel-- [place]  . VASECTOMY  02/2013    There were no vitals filed for this visit.  Subjective Assessment - 11/16/16 1320    Subjective   MD appt today:  Pt reports that only restrictions are no overhead lifting more than 25lbs overhead, no restrictions on neck movement, will refer for PT, able to return to work/driving as he feels comfortable, use pain as a guide    Pertinent History  debulking of cervical spine tumor/Cervical athrodesisdiagnosed at young age with neurofibromatosis, Horners Syndrome, and hydrocephalus, numerous cervical surgeries over the years (6-7)    Limitations  fall risk, no lifting >8lbs (gallon of milk), avoid overhead lifting, no looking up/down with neck, driving     Patient Stated Goals  improved coordination/strength LUE, improve foot drop    Currently in  Pain?  Yes    Pain Score  5     Pain Location  Neck    Pain Orientation  Medial    Pain Descriptors / Indicators  Aching    Pain Type  Chronic pain    Pain Onset  More than a month ago    Pain Frequency  Constant    Aggravating Factors   MD appt and strength testing and doing housework over the weekend    Pain Relieving Factors  pain meds         [facility] OT Assessment - 11/16/16 0001      Precautions   Precautions  Fall    Precaution Comments  no lifting >25lbs overhead, no restrictions on neck movement, return to work/driving as pt feels able, use pain as a guide         Picking up blocks with gripper set on level 1 (black spring) for sustained grip strength with min difficulty.  Completing Purdue pegboard with min difficulty/incr time for coordination.  Placing small pegs in pegboard with each finger/thumb with min-mod difficulty for improved individual finger coordination.    Pt was able to tie shoe, but needed assist to untie double knot.                 OT Education - 11/16/16 1354    Education Details  Yellow theraband  HEP (low-range)    Person(s) Educated  Patient    Methods  Explanation;Demonstration;Verbal cues;Handout    Comprehension  Verbalized understanding;Returned demonstration       OT Short Term Goals - 11/16/16 1340      OT SHORT TERM GOAL #1   Title  Pt will be I with HEP - check STGs 11/20/16    Time  4    Period  Weeks    Status  Achieved 11/12/16 met   11/12/16 met     OT SHORT TERM GOAL #2   Title  Pt will demonstrate improved coordination for ADLs as evidenced by decreasing time on 9-hole peg test by at least 10sec with LUE.    Baseline  62.06sec    Time  4    Period  Weeks    Status  Achieved 11/16/16:  37.60sec   11/16/16:  37.60sec     OT SHORT TERM GOAL #3   Title  Pt will perform bathing mod I.    Time  4    Period  Weeks    Status  Achieved 11/12/16  met   11/12/16  met     OT SHORT TERM GOAL #4   Title  Pt will be  mod I with tying shoes with AE prn    Time  4    Period  Weeks    Status  Achieved 11/16/16:  met with min difficulty due to neck discomfort   11/16/16:  met with min difficulty due to neck discomfort     OT SHORT TERM GOAL #5   Title  Pt will improve L hand grip strength by at least 6lbs to assist with opening containers.    Baseline  L-26lbs    Time  4    Status  Achieved 11/16/16:  38lbs   11/16/16:  38lbs       OT Long Term Goals - 11/16/16 1338      OT LONG TERM GOAL #1   Title  Pt will be I with upgraded HEP prn - check LTGs 12/20/16    Time  8    Period  Weeks    Status  New      OT LONG TERM GOAL #2   Title  Pt will demonstrate improved coordination for ADLs as evidenced by decreasing time on 9-hole peg test by at least 10sec with LUE.    Time  8    Period  Weeks    Status  New      OT LONG TERM GOAL #3   Title  Pt will perform previous light cooking/cleaning tasks mod I.    Time  8    Period  Weeks    Status  New      OT LONG TERM GOAL #4   Title  Patient will be able to type with both hands within reasonable period of time in preparation to returning to work.    Time  8    Period  Weeks    Status  New      OT LONG TERM GOAL #5   Title  Pt will improve L hand grip strength by at least 12lbs to assist with opening containers.    Baseline  26lbs    Time  8    Period  Weeks    Status  New      OT LONG TERM GOAL #6   Title  Pt will improve ability to fasten buttons as shown by being  able to fasten/unfasten 3 buttons in less than 50sec.    Baseline  64.94    Time  8    Period  Weeks    Status  New            Plan - 11/16/16 1320    Clinical Impression Statement  Pt is progressing towards goals with improving coordination and strength.      Rehab Potential  Good    Current Impairments/barriers affecting progress:  chronic neck pain, hx of multiple cervical surgeries    OT Frequency  2x / week    OT Duration  8 weeks    OT Treatment/Interventions   Cryotherapy;Parrafin;Therapeutic exercise;DME and/or AE instruction;Therapist, nutritional;Therapeutic activities;Patient/family education;Manual Therapy;Neuromuscular education;Fluidtherapy;Self-care/ADL training;Energy conservation;Passive range of motion;Therapeutic exercises;Moist Heat    Plan  review yellow theraband HEP    OT Home Exercise Plan  Education provided:  yellow putty HEP; coordination HEP    Recommended Other Services  Pt reports that surgeon will send order for PT (appt 11/16/16)    Consulted and Agree with Plan of Care  Patient       Patient will benefit from skilled therapeutic intervention in order to improve the following deficits and impairments:  Decreased activity tolerance, Decreased coordination, Decreased knowledge of use of DME, Decreased strength, Impaired UE functional use, Pain, Decreased range of motion, Decreased balance, Decreased mobility, Impaired sensation  Visit Diagnosis: Other lack of coordination  Muscle weakness (generalized)  Other disturbances of skin sensation  Stiffness of left shoulder, not elsewhere classified  Unsteadiness on feet    Problem List Patient Active Problem List   Diagnosis Date Noted  . Stress due to illness of family member 06/10/2016  . Syncope 04/23/2016  . Onychomycosis 04/23/2016  . Cerumen impaction 04/23/2016  . Routine general medical examination at a health care facility 06/13/2014  . Constipation due to opioid therapy 06/13/2014  . Chronic pain syndrome 02/21/2014  . Allergic reaction 02/20/2014  . Neurofibromatosis, type 1 ([place]) 12/14/2013  . Preventative health care 06/23/2010  . HORNER'S SYNDROME 04/12/2009    [facility] 11/16/2016, 5:44 PM  [facility] [address] [place], [place], [address] Phone: [phone]   Fax:  [phone]  Name: Larry Riley MRN: [id number] Date of Birth: 1980-06-28   Vianne Bulls, OTR/L [facility] [address] [place], [place]  [address] [phone] phone [phone] 11/16/16 5:44 PM

## 2016-11-19 ENCOUNTER — Encounter: Payer: Self-pay | Admitting: Nurse Practitioner

## 2016-11-19 ENCOUNTER — Encounter: Payer: Self-pay | Admitting: Occupational Therapy

## 2016-11-19 ENCOUNTER — Other Ambulatory Visit: Payer: 59

## 2016-11-19 ENCOUNTER — Ambulatory Visit: Payer: 59 | Admitting: Occupational Therapy

## 2016-11-19 ENCOUNTER — Ambulatory Visit (INDEPENDENT_AMBULATORY_CARE_PROVIDER_SITE_OTHER): Payer: 59 | Admitting: Nurse Practitioner

## 2016-11-19 VITALS — BP 112/82 | HR 117 | Temp 97.6°F | Ht 68.0 in | Wt 191.0 lb

## 2016-11-19 DIAGNOSIS — Q8501 Neurofibromatosis, type 1: Secondary | ICD-10-CM | POA: Diagnosis not present

## 2016-11-19 DIAGNOSIS — M6281 Muscle weakness (generalized): Secondary | ICD-10-CM | POA: Diagnosis not present

## 2016-11-19 DIAGNOSIS — R278 Other lack of coordination: Secondary | ICD-10-CM

## 2016-11-19 DIAGNOSIS — Z0289 Encounter for other administrative examinations: Secondary | ICD-10-CM

## 2016-11-19 DIAGNOSIS — G894 Chronic pain syndrome: Secondary | ICD-10-CM

## 2016-11-19 DIAGNOSIS — R208 Other disturbances of skin sensation: Secondary | ICD-10-CM

## 2016-11-19 DIAGNOSIS — R531 Weakness: Secondary | ICD-10-CM | POA: Insufficient documentation

## 2016-11-19 DIAGNOSIS — R2681 Unsteadiness on feet: Secondary | ICD-10-CM

## 2016-11-19 DIAGNOSIS — M25612 Stiffness of left shoulder, not elsewhere classified: Secondary | ICD-10-CM

## 2016-11-19 MED ORDER — TRAMADOL HCL 50 MG PO TABS: 50.0000 mg | ORAL_TABLET | Freq: Three times a day (TID) | ORAL | 0 refills | Status: DC | PRN

## 2016-11-19 NOTE — Assessment & Plan Note (Signed)
He was advised about [facility] policy and contract. I agreed to sign contract while waiting for appointment with pain clinic. He was provided with copies and UDS was collected today. I also discussed possible use of cymbalta or gabapentin or lyrica to manage pain. He decided to wait till he is seen at pain clinic to discuss additional medications.  Tramadol Rx given to patient in office today

## 2016-11-19 NOTE — Therapy (Signed)
[facility] [address] [place], [place], [address] Phone: [phone]   Fax:  [phone]  Occupational Therapy Treatment  Patient Details  Name: Larry Riley MRN: [id number] Date of Birth: [birth year] Referring Provider: Dennison Mascot, NP   Encounter Date: 11/19/2016  OT End of Session - 11/19/16 1322    Visit Number  7    Number of Visits  17    Date for OT Re-Evaluation  12/20/16    Authorization Type  UHC--no visit limit PT/OT (20 visit limit for cognitive therapy)    OT Start Time  1316    OT Stop Time  1400    OT Time Calculation (min)  44 min    Activity Tolerance  Patient tolerated treatment well    Behavior During Therapy  [facility] for tasks assessed/performed       Past Medical History:  Diagnosis Date  . MRSA (methicillin resistant Staphylococcus aureus) 2005   leg  . Neurofibromatosis    tumor down spine and brain    Past Surgical History:  Procedure Laterality Date  . hydrocephalus     [place]unt 1999[place]  . NECK SURGERY      tumor removal  . SPINE SURGERY  10/2013   c2-t2 fusion  , laminectomy c1-6-- Dr Rigoberto Noel-- [place]  . VASECTOMY  02/2013    There were no vitals filed for this visit.  Subjective Assessment - 11/19/16 1318    Subjective   Will start working from home next week.      Pertinent History  debulking of cervical spine tumor/Cervical athrodesisdiagnosed at young age with neurofibromatosis, Horners Syndrome, and hydrocephalus, numerous cervical surgeries over the years (6-7)    Limitations  fall risk, no lifting >8lbs (gallon of milk), avoid overhead lifting, no looking up/down with neck, driving     Patient Stated Goals  improved coordination/strength LUE, improve foot drop    Currently in Pain?  Yes    Pain Score  4     Pain Location  Neck    Pain Descriptors / Indicators  Aching    Pain Type  Chronic pain    Pain Onset  More than a month ago    Pain Frequency   Constant    Aggravating Factors   too much activities    Pain Relieving Factors  pain meds        Removing small pegs from pegboard with L hand to place pegs in pegboard with min difficulty.  Arm bike x6 min level 1 for reciprocal movement/conditioning without rest, maintained approx 54 rpms without rest, forward/backwards .  Placing [place] pegs in pegboard with L hand with min difficulty/incr time for coordination, removing with each finger/thumb.  Reviewed yellow theraband HEP and pt returned demo x10 each with min v.c.                       OT Short Term Goals - 11/16/16 1340      OT SHORT TERM GOAL #1   Title  Pt will be I with HEP - check STGs 11/20/16    Time  4    Period  Weeks    Status  Achieved 11/12/16 met      OT SHORT TERM GOAL #2   Title  Pt will demonstrate improved coordination for ADLs as evidenced by decreasing time on 9-hole peg test by at least 10sec with LUE.    Baseline  62.06sec    Time  4    Period  Weeks    Status  Achieved 11/16/16:  37.60sec      OT SHORT TERM GOAL #3   Title  Pt will perform bathing mod I.    Time  4    Period  Weeks    Status  Achieved 11/12/16  met      OT SHORT TERM GOAL #4   Title  Pt will be mod I with tying shoes with AE prn    Time  4    Period  Weeks    Status  Achieved 11/16/16:  met with min difficulty due to neck discomfort      OT SHORT TERM GOAL #5   Title  Pt will improve L hand grip strength by at least 6lbs to assist with opening containers.    Baseline  L-26lbs    Time  4    Status  Achieved 11/16/16:  38lbs        OT Long Term Goals - 11/16/16 1338      OT LONG TERM GOAL #1   Title  Pt will be I with upgraded HEP prn - check LTGs 12/20/16    Time  8    Period  Weeks    Status  New      OT LONG TERM GOAL #2   Title  Pt will demonstrate improved coordination for ADLs as evidenced by decreasing time on 9-hole peg test by at least 10sec with LUE.    Time  8    Period  Weeks     Status  New      OT LONG TERM GOAL #3   Title  Pt will perform previous light cooking/cleaning tasks mod I.    Time  8    Period  Weeks    Status  New      OT LONG TERM GOAL #4   Title  Patient will be able to type with both hands within reasonable period of time in preparation to returning to work.    Time  8    Period  Weeks    Status  New      OT LONG TERM GOAL #5   Title  Pt will improve L hand grip strength by at least 12lbs to assist with opening containers.    Baseline  26lbs    Time  8    Period  Weeks    Status  New      OT LONG TERM GOAL #6   Title  Pt will improve ability to fasten buttons as shown by being able to fasten/unfasten 3 buttons in less than 50sec.    Baseline  64.94    Time  8    Period  Weeks    Status  New            Plan - 11/19/16 1323    Clinical Impression Statement  Pt is progressing towards goals with improving activity tolerance, strength and coordination.    Rehab Potential  Good    Current Impairments/barriers affecting progress:  chronic neck pain, hx of multiple cervical surgeries    OT Frequency  2x / week    OT Duration  8 weeks    OT Treatment/Interventions  Cryotherapy;Parrafin;Therapeutic exercise;DME and/or AE instruction;Therapist, nutritional;Therapeutic activities;Patient/family education;Manual Therapy;Neuromuscular education;Fluidtherapy;Self-care/ADL training;Energy conservation;Passive range of motion;Therapeutic exercises;Moist Heat    Plan  continue with activity tolerance, strength, coordination    OT Home Exercise Plan  Education provided:  yellow  putty HEP; coordination HEP    Consulted and Agree with Plan of Care  Patient       Patient will benefit from skilled therapeutic intervention in order to improve the following deficits and impairments:  Decreased activity tolerance, Decreased coordination, Decreased knowledge of use of DME, Decreased strength, Impaired UE functional use, Pain, Decreased range of  motion, Decreased balance, Decreased mobility, Impaired sensation  Visit Diagnosis: Other lack of coordination  Muscle weakness (generalized)  Other disturbances of skin sensation  Stiffness of left shoulder, not elsewhere classified  Unsteadiness on feet    Problem List Patient Active Problem List   Diagnosis Date Noted  . Weakness of left side of body 11/19/2016  . Stress due to illness of family member 06/10/2016  . Syncope 04/23/2016  . Onychomycosis 04/23/2016  . Cerumen impaction 04/23/2016  . Routine general medical examination at a health care facility 06/13/2014  . Constipation due to opioid therapy 06/13/2014  . Chronic pain syndrome 02/21/2014  . Allergic reaction 02/20/2014  . Neurofibromatosis, type 1 ([place]) 12/14/2013  . Preventative health care 06/23/2010  . HORNER'S SYNDROME 04/12/2009    [facility] 11/19/2016, 5:06 PM  [facility] [address] [place], [place], [address] Phone: [phone]   Fax:  [phone]  Name: Larry Riley MRN: [id number] Date of Birth: July 28, 1980   Vianne Bulls, OTR/L [facility] [address] [place], [place]  [address] [phone] phone [phone] 11/19/16 5:06 PM

## 2016-11-19 NOTE — Patient Instructions (Addendum)
You will be contacted to schedule appt with pain clinic.  He was advised about [facility] policy and contract. I agreed to sign contract while waiting for appointment with pain clinic. He was provided with copies and UDS was collected today. I also discussed possible use of cymbalta or gabapentin or lyrica to manage pain. He decided to wait till he is seen at pain clinic to discuss additional medications.

## 2016-11-19 NOTE — Progress Notes (Signed)
Subjective:  Patient ID: Larry Riley, male    DOB: [birth year]  Age: 36 y.o. MRN: [id number]  CC: Medication Refill (ultram consult and pain management consult. has appt with new PCP in 01/2017. )   HPI  Chronic pain management secondary to neurofibromatosis type 1: Current use of tramadol, flexeril and tylenol prn for pain in neck and lower back. He is 40month s/p cervical tumor resection done [facility] Raynald KempHsu [facility]. Post surgery he was prescribed hydrocodone 5/325mg , OT and PT. Today he reports decreased cervical and lumbar spine pain post recent surgery. Uses tramadol 1-2times a day as needed depending on his level of activity. He has returned to work with restrictions, has ongoing OT  and PT. Constipation has improved after stopping hydrocodone. Persistent weakness on left side. Chronic per patient. Has hx of frequent falls due to left leg and foot weakness. Last fall 08/2016. He will be getting foot brace with spine center.  Indication for chronic opioid: back and neck pain. Medication and dose: tramadol 50mg  TID prn # pills per month: 90 Last UDS date: none Pain contract signed (Y/N): yes Date narcotic database last reviewed (include red flags): no red flags [facility] [address], [place]  Outpatient Medications Prior to Visit  Medication Sig Dispense Refill  . acetaminophen (TYLENOL) 325 MG tablet Take 650 mg by mouth every 6 (six) hours as needed for mild pain, moderate pain, fever or headache.     . bisacodyl (DULCOLAX) 5 MG EC tablet Take 5 mg by mouth daily as needed for mild constipation.     . cyclobenzaprine (FLEXERIL) 10 MG tablet TAKE ONE TABLET BY MOUTH THREE TIMES A DAY AS NEEDED FOR MUSCLE SPASMS 90 tablet 0  . diphenhydrAMINE (BENADRYL) 25 mg capsule Take 25 mg by mouth at bedtime as needed for allergies or sleep.    Marland Kitchen. EPINEPHrine (EPIPEN 2-PAK) 0.3 mg/0.3 mL IJ SOAJ injection Inject 0.3 mLs (0.3 mg total) into the  muscle once. 1 Device 1  . guaiFENesin (MUCINEX) 600 MG 12 hr tablet Take 600 mg by mouth 2 (two) times daily as needed for cough or to loosen phlegm.    Marland Kitchen. ibuprofen (ADVIL,MOTRIN) 200 MG tablet Take 400 mg by mouth every 6 (six) hours as needed for headache, mild pain or moderate pain.    Marland Kitchen. LORazepam (ATIVAN) 0.5 MG tablet Take 1-2 tablets (0.5-1 mg total) by mouth daily as needed for anxiety. 30 tablet 1  . omeprazole (PRILOSEC) 20 MG capsule Take 20 mg by mouth daily as needed (for acid reflux).    . pantoprazole (PROTONIX) 40 MG tablet Take 1 tablet (40 mg total) by mouth daily. 90 tablet 3  . sildenafil (REVATIO) 20 MG tablet Take 1-5 tablets by mouth daily as needed. 50 tablet 1  . terbinafine (LAMISIL) 250 MG tablet Take 1 tablet (250 mg total) by mouth daily. 30 tablet 1  . terbinafine (LAMISIL) 250 MG tablet Take 1 tablet (250 mg total) by mouth daily. 30 tablet 0  . HYDROCODONE-ACETAMINOPHEN PO Take by mouth as needed.    . traMADol (ULTRAM) 50 MG tablet Take 1 tablet (50 mg total) by mouth every 8 (eight) hours as needed. 90 tablet 0   No facility-administered medications prior to visit.     ROS See HPI  Objective:  BP 112/82   Pulse (!) 117   Temp 97.6 F (36.4 C)   Ht 5\' 8"  (1.727 m)  Wt 191 lb (86.6 kg)   SpO2 98%   BMI 29.04 kg/m   BP Readings from Last 3 Encounters:  11/19/16 112/82  06/15/16 128/72  04/23/16 128/90    Wt Readings from Last 3 Encounters:  11/19/16 191 lb (86.6 kg)  06/15/16 192 lb 1.9 oz (87.1 kg)  05/22/16 190 lb (86.2 kg)    Physical Exam  Constitutional: He is oriented to person, place, and time.  Cardiovascular: Normal rate.  Pulmonary/Chest: Effort normal.  Musculoskeletal: He exhibits deformity. He exhibits no edema.       Cervical back: He exhibits decreased range of motion. He exhibits no tenderness, no bony tenderness and normal pulse.       Thoracic back: He exhibits decreased range of motion.       Lumbar back: He exhibits  decreased range of motion. He exhibits normal pulse.  Neurological: He is alert and oriented to person, place, and time. He displays no tremor. He exhibits abnormal muscle tone. Gait abnormal.  Decreased muscle strength on left side (upper and lower extremities) 4/5. Left foot drop. Left side facial drop (chronic per patient)  Vitals reviewed.   Lab Results  Component Value Date   WBC 8.0 06/15/2016   HGB 15.1 06/15/2016   HCT 43.6 06/15/2016   PLT 430.0 (H) 06/15/2016   GLUCOSE 85 06/15/2016   CHOL 159 06/15/2016   TRIG 82.0 06/15/2016   HDL 51.60 06/15/2016   LDLCALC 91 06/15/2016   ALT 40 08/21/2016   AST 26 08/21/2016   NA 139 06/15/2016   K 4.1 06/15/2016   CL 105 06/15/2016   CREATININE 0.89 06/15/2016   BUN 18 06/15/2016   CO2 29 06/15/2016   TSH 3.25 06/13/2015    Dg Chest 2 View  Result Date: 04/13/2016 CLINICAL DATA:  Syncope. Dizziness. Hypotension. Neurofibromatosis type 1. EXAM: CHEST  2 VIEW COMPARISON:  07/29/2008 FINDINGS: Heart size is normal. Posterior spine fusion hardware seen in the lower cervical upper thoracic spine. A left superior posterior mediastinal mass shows no significant change, consistent with a neurofibroma. No evidence of pulmonary infiltrate or pleural effusion. VP shunt tubing noted within the right chest wall. IMPRESSION: No active lung disease. Stable left posterior mediastinal mass, consistent with known neurofibroma. Electronically Signed   By: Myles RosenthalJohn  Stahl M.D.   On: 04/13/2016 11:56   Ct Head Wo Contrast  Result Date: 04/13/2016 CLINICAL DATA:  Headache. History of neurofibromatosis. Ventriculostomy shunt catheter in place. EXAM: CT HEAD WITHOUT CONTRAST TECHNIQUE: Contiguous axial images were obtained from the base of the skull through the vertex without intravenous contrast. COMPARISON:  Head CT scan 07/29/2008.  Brain MRI 07/12/2010. FINDINGS: Brain: No acute abnormality including hemorrhage, infarct, mass lesion, mass effect, midline  shift or abnormal extra-axial fluid collection. No pneumocephalus. Right frontal approach ventriculostomy shunt catheter is in place. Asymmetry of the frontal horns the ventricular system, more prominent on the right, is unchanged. The ventricular system does not appear dilated. No pneumocephalus. Vascular: Negative. Skull: No fracture or worrisome lesion. Sinuses/Orbits: Negative. Other: None. IMPRESSION: No acute abnormality. Negative for hydrocephalus with a shunt catheter in place. Electronically Signed   By: Drusilla Kannerhomas  Dalessio M.D.   On: 04/13/2016 12:24    Assessment & Plan:   Larry BootyJoshua was seen today for medication refill.  Diagnoses and all orders for this visit:  Chronic pain syndrome -     Ambulatory referral to Pain Clinic -     traMADol (ULTRAM) 50 MG tablet; Take 1 tablet (50  mg total) every 8 (eight) hours as needed by mouth. -     Pain Mgmt, Profile 8 w/Conf, U; Future  Neurofibromatosis, type 1 (HCC) -     Ambulatory referral to Pain Clinic -     traMADol (ULTRAM) 50 MG tablet; Take 1 tablet (50 mg total) every 8 (eight) hours as needed by mouth. -     Pain Mgmt, Profile 8 w/Conf, U; Future  Pain management contract signed  Weakness of left side of body   I have discontinued Damin Charney's HYDROCODONE-ACETAMINOPHEN PO. I have also changed his traMADol. Additionally, I am having him maintain his acetaminophen, pantoprazole, EPINEPHrine, bisacodyl, diphenhydrAMINE, omeprazole, ibuprofen, guaiFENesin, terbinafine, sildenafil, terbinafine, LORazepam, and cyclobenzaprine.  Meds ordered this encounter  Medications  . traMADol (ULTRAM) 50 MG tablet    Sig: Take 1 tablet (50 mg total) every 8 (eight) hours as needed by mouth.    Dispense:  90 tablet    Refill:  0    Order Specific Question:   Supervising Provider    Answer:   Tresa Garter [[id number]]    Follow-up: Return in about 4 weeks (around 12/17/2016) for with Apolonio Schneiders, NP.  Alysia Penna, NP

## 2016-11-23 ENCOUNTER — Ambulatory Visit (INDEPENDENT_AMBULATORY_CARE_PROVIDER_SITE_OTHER): Payer: 59 | Admitting: Podiatry

## 2016-11-23 ENCOUNTER — Telehealth: Payer: Self-pay | Admitting: Podiatry

## 2016-11-23 DIAGNOSIS — M79674 Pain in right toe(s): Secondary | ICD-10-CM

## 2016-11-23 DIAGNOSIS — B351 Tinea unguium: Secondary | ICD-10-CM

## 2016-11-23 DIAGNOSIS — M79675 Pain in left toe(s): Secondary | ICD-10-CM

## 2016-11-23 MED ORDER — TERBINAFINE HCL 250 MG PO TABS: ORAL_TABLET | ORAL | 0 refills | Status: DC

## 2016-11-23 NOTE — Progress Notes (Signed)
Subjective:    Patient ID: Larry Riley, male   DOB: 36 y.o.   MRN: [id number]   HPI patient states his nails have improved but they don't seem is thick and there still is discoloration    ROS      Objective:  Physical Exam neurovascular status intact with patient recovering well after having neurofibroma excision several months ago. Patient is noted to have discoloration of nailbeds left over right foot     Assessment:   Mycotic nail infection still present      Plan:   We're to start him on a pulse Lamisil therapy to try to reduce the remaining fungal infection and he is very pleased with how healthy his skin is at this time. Placed on a 7 toe per month regimen

## 2016-11-24 ENCOUNTER — Encounter: Payer: Self-pay | Admitting: Occupational Therapy

## 2016-11-24 ENCOUNTER — Ambulatory Visit: Payer: 59 | Admitting: Occupational Therapy

## 2016-11-24 DIAGNOSIS — R208 Other disturbances of skin sensation: Secondary | ICD-10-CM

## 2016-11-24 DIAGNOSIS — M6281 Muscle weakness (generalized): Secondary | ICD-10-CM | POA: Diagnosis not present

## 2016-11-24 DIAGNOSIS — M25612 Stiffness of left shoulder, not elsewhere classified: Secondary | ICD-10-CM

## 2016-11-24 DIAGNOSIS — R2681 Unsteadiness on feet: Secondary | ICD-10-CM

## 2016-11-24 DIAGNOSIS — R278 Other lack of coordination: Secondary | ICD-10-CM

## 2016-11-24 NOTE — Telephone Encounter (Signed)
I saw Dr. Charlsie Merlesegal this morning and he sent a prescription to the [facility]. They had left a message regarding prior authorization for my insurance to pay for the medication.. If you could please call me back at [phone]. Thank you.

## 2016-11-24 NOTE — Therapy (Signed)
[facility] [address] [place], [place], [address] Phone: [phone]   Fax:  [phone]  Occupational Therapy Treatment  Patient Details  Name: Larry Riley MRN: [id number] Date of Birth: January 27, 1980 Referring Provider: Dennison Mascot, NP   Encounter Date: 11/24/2016  OT End of Session - 11/24/16 1556    Visit Number  8    Number of Visits  17    Date for OT Re-Evaluation  12/20/16    Authorization Type  UHC--no visit limit PT/OT (20 visit limit for cognitive therapy)    OT Start Time  1319    OT Stop Time  1402    OT Time Calculation (min)  43 min    Activity Tolerance  Patient tolerated treatment well    Behavior During Therapy  [facility] for tasks assessed/performed       Past Medical History:  Diagnosis Date  . MRSA (methicillin resistant Staphylococcus aureus) 2005   leg  . Neurofibromatosis    tumor down spine and brain    Past Surgical History:  Procedure Laterality Date  . hydrocephalus     [place] 1999[place]  . NECK SURGERY      tumor removal  . SPINE SURGERY  10/2013   c2-t2 fusion  , laminectomy c1-6-- Dr Rigoberto Noel-- [place]  . VASECTOMY  02/2013    There were no vitals filed for this visit.  Subjective Assessment - 11/24/16 1329    Subjective   I worked for four hours this morning.  Patient just started working from home.      Patient is accompained by:  Family member    Pertinent History  debulking of cervical spine tumor/Cervical athrodesisdiagnosed at young age with neurofibromatosis, Horners Syndrome, and hydrocephalus, numerous cervical surgeries over the years (6-7)    Limitations  fall risk, no lifting >8lbs (gallon of milk), avoid overhead lifting, no looking up/down with neck, driving     Patient Stated Goals  improved coordination/strength LUE, improve foot drop    Currently in Pain?  Yes    Pain Score  5     Pain Location  Neck    Pain Orientation  Right;Left    Pain  Descriptors / Indicators  Aching;Pressure    Pain Type  Chronic pain    Pain Onset  More than a month ago    Pain Frequency  Constant    Aggravating Factors   increased activity    Pain Relieving Factors  pain meds    Effect of Pain on Daily Activities  OT will not address due to chronic nature of illness                   OT Treatments/Exercises (OP) - 11/24/16 0001      ADLs   Writing  Patient is able to type using BUE's.  Patient has modified keyboarding skills, and has for years. He has limited use of 4th and 5th digits, yet types fairly efficiently.  He feels speed is not quite as fast as before recent surgery, but improving.  Patient encourgaed to practice typing to improve speed.       Work  Patient has started working from home this week.  He is starting at 4 hrs/day, hoping to quickly transition up to 8 hrs/day.  Patient reported slight increase in upper thoracic pain after working 4 hours this morning,  Discussed some options for work station to optimize positioning for comfort, e.g stand to sit desk.  Neurological Re-education Exercises   Other Exercises 1  Worked on in hand manipulation skills and updated coordination HEP.               OT Education - 11/24/16 1555    Education provided  Yes    Education Details  Additional coordination exercises    Person(s) Educated  Patient    Methods  Explanation;Demonstration    Comprehension  Verbalized understanding;Returned demonstration       OT Short Term Goals - 11/16/16 1340      OT SHORT TERM GOAL #1   Title  Pt will be I with HEP - check STGs 11/20/16    Time  4    Period  Weeks    Status  Achieved 11/12/16 met      OT SHORT TERM GOAL #2   Title  Pt will demonstrate improved coordination for ADLs as evidenced by decreasing time on 9-hole peg test by at least 10sec with LUE.    Baseline  62.06sec    Time  4    Period  Weeks    Status  Achieved 11/16/16:  37.60sec      OT SHORT TERM GOAL #3    Title  Pt will perform bathing mod I.    Time  4    Period  Weeks    Status  Achieved 11/12/16  met      OT SHORT TERM GOAL #4   Title  Pt will be mod I with tying shoes with AE prn    Time  4    Period  Weeks    Status  Achieved 11/16/16:  met with min difficulty due to neck discomfort      OT SHORT TERM GOAL #5   Title  Pt will improve L hand grip strength by at least 6lbs to assist with opening containers.    Baseline  L-26lbs    Time  4    Status  Achieved 11/16/16:  38lbs        OT Long Term Goals - 11/24/16 1559      OT LONG TERM GOAL #1   Title  Pt will be I with upgraded HEP prn - check LTGs 12/20/16    Status  On-going      OT LONG TERM GOAL #2   Title  Pt will demonstrate improved coordination for ADLs as evidenced by decreasing time on 9-hole peg test by at least 10sec with LUE.    Status  On-going      OT LONG TERM GOAL #3   Title  Pt will perform previous light cooking/cleaning tasks mod I.    Status  Achieved      OT LONG TERM GOAL #4   Title  Patient will be able to type with both hands within reasonable period of time in preparation to returning to work.    Status  On-going      OT LONG TERM GOAL #5   Title  Pt will improve L hand grip strength by at least 12lbs to assist with opening containers.    Status  On-going      OT LONG TERM GOAL #6   Title  Pt will improve ability to fasten buttons as shown by being able to fasten/unfasten 3 buttons in less than 50sec.    Status  On-going            Plan - 11/24/16 1556    Clinical Impression Statement  Patient is showing steady   functional improvement.  He has returned to work (from home part time) and is more actively engaged in household tasks - cooking and cleaning.      Rehab Potential  Good    Current Impairments/barriers affecting progress:  chronic neck pain, hx of multiple cervical surgeries    OT Frequency  2x / week    OT Duration  8 weeks    OT Treatment/Interventions   Cryotherapy;Parrafin;Therapeutic exercise;DME and/or AE instruction;Therapist, nutritional;Therapeutic activities;Patient/family education;Manual Therapy;Neuromuscular education;Fluidtherapy;Self-care/ADL training;Energy conservation;Passive range of motion;Therapeutic exercises;Moist Heat    Plan  continue with activity tolerance, strength, coordiantion    OT Home Exercise Plan  Education provided:  yellow putty HEP; coordination HEP    Consulted and Agree with Plan of Care  Patient       Patient will benefit from skilled therapeutic intervention in order to improve the following deficits and impairments:  Decreased activity tolerance, Decreased coordination, Decreased knowledge of use of DME, Decreased strength, Impaired UE functional use, Pain, Decreased range of motion, Decreased balance, Decreased mobility, Impaired sensation  Visit Diagnosis: Other lack of coordination  Other disturbances of skin sensation  Muscle weakness (generalized)  Stiffness of left shoulder, not elsewhere classified  Unsteadiness on feet    Problem List Patient Active Problem List   Diagnosis Date Noted  . Weakness of left side of body 11/19/2016  . Stress due to illness of family member 06/10/2016  . Syncope 04/23/2016  . Onychomycosis 04/23/2016  . Cerumen impaction 04/23/2016  . Routine general medical examination at a health care facility 06/13/2014  . Constipation due to opioid therapy 06/13/2014  . Chronic pain syndrome 02/21/2014  . Allergic reaction 02/20/2014  . Neurofibromatosis, type 1 ([place]) 12/14/2013  . Preventative health care 06/23/2010  . HORNER'S SYNDROME 04/12/2009    Mariah Milling, OTR/L 11/24/2016, 4:00 PM  [facility] [address] [place], [place], [address] Phone: [phone]   Fax:  [phone]  Name: Nehemyah Foushee MRN: [id number] Date of Birth: 1980/12/01

## 2016-11-24 NOTE — Patient Instructions (Signed)
  Coordination Activities  Perform the following activities for 10 minutes 1-2 times per day with both hand(s).   Flip cards 1 at a time as fast as you can.  Twirl pen between fingers.  Practice writing and/or typing.  Screw together nuts and bolts, then unfasten.  rotate two golf balls clockwise and counter clockwise in your hands

## 2016-11-25 ENCOUNTER — Ambulatory Visit (INDEPENDENT_AMBULATORY_CARE_PROVIDER_SITE_OTHER): Payer: 59 | Admitting: Psychology

## 2016-11-25 DIAGNOSIS — F411 Generalized anxiety disorder: Secondary | ICD-10-CM | POA: Diagnosis not present

## 2016-11-25 LAB — PAIN MGMT, PROFILE 8 W/CONF, U
6 Acetylmorphine: NEGATIVE ng/mL (ref <10)
ALCOHOL METABOLITES: POSITIVE ng/mL — AB (ref <500)
ALPHAHYDROXYMIDAZOLAM: NEGATIVE ng/mL (ref <50)
AMINOCLONAZEPAM: NEGATIVE ng/mL (ref <25)
AMPHETAMINES: NEGATIVE ng/mL (ref <500)
Alphahydroxyalprazolam: NEGATIVE ng/mL (ref <25)
Alphahydroxytriazolam: NEGATIVE ng/mL (ref <50)
Benzodiazepines: NEGATIVE ng/mL (ref <100)
Buprenorphine, Urine: NEGATIVE ng/mL (ref <5)
COCAINE METABOLITE: NEGATIVE ng/mL (ref <150)
CODEINE: NEGATIVE ng/mL (ref <50)
CREATININE: 138.5 mg/dL
ETHYL GLUCURONIDE (ETG): 4860 ng/mL — AB (ref <500)
ETHYL SULFATE (ETS): 588 ng/mL — AB (ref <100)
Hydrocodone: 783 ng/mL — ABNORMAL HIGH (ref <50)
Hydromorphone: 303 ng/mL — ABNORMAL HIGH (ref <50)
Hydroxyethylflurazepam: NEGATIVE ng/mL (ref <50)
LORAZEPAM: NEGATIVE ng/mL (ref <50)
MDMA: NEGATIVE ng/mL (ref <500)
MORPHINE: NEGATIVE ng/mL (ref <50)
Marijuana Metabolite: NEGATIVE ng/mL (ref <20)
NORDIAZEPAM: NEGATIVE ng/mL (ref <50)
NORHYDROCODONE: 2797 ng/mL — AB (ref <50)
OPIATES: POSITIVE ng/mL — AB (ref <100)
OXIDANT: NEGATIVE ug/mL (ref <200)
OXYCODONE: NEGATIVE ng/mL (ref <100)
Oxazepam: NEGATIVE ng/mL (ref <50)
PH: 6.29 (ref 4.5–9.0)
TEMAZEPAM: NEGATIVE ng/mL (ref <50)

## 2016-11-26 ENCOUNTER — Encounter: Payer: Self-pay | Admitting: Occupational Therapy

## 2016-11-26 ENCOUNTER — Ambulatory Visit: Payer: 59 | Admitting: Occupational Therapy

## 2016-11-26 ENCOUNTER — Telehealth: Payer: Self-pay | Admitting: Nurse Practitioner

## 2016-11-26 DIAGNOSIS — M25612 Stiffness of left shoulder, not elsewhere classified: Secondary | ICD-10-CM

## 2016-11-26 DIAGNOSIS — M6281 Muscle weakness (generalized): Secondary | ICD-10-CM | POA: Diagnosis not present

## 2016-11-26 DIAGNOSIS — R278 Other lack of coordination: Secondary | ICD-10-CM

## 2016-11-26 DIAGNOSIS — R208 Other disturbances of skin sensation: Secondary | ICD-10-CM

## 2016-11-26 DIAGNOSIS — R2681 Unsteadiness on feet: Secondary | ICD-10-CM

## 2016-11-26 NOTE — Telephone Encounter (Signed)
Pt returned your call regarding his test results. He would like a call back when you can please.

## 2016-11-26 NOTE — Therapy (Signed)
[facility] [address] [place], [place], [address] Phone: [phone]   Fax:  [phone]  Occupational Therapy Treatment  Patient Details  Name: Larry Riley MRN: [id number] Date of Birth: 1980-08-28 Referring Provider: Dennison Mascot, NP   Encounter Date: 11/26/2016  OT End of Session - 11/26/16 1546    Visit Number  9    Number of Visits  17    Date for OT Re-Evaluation  12/20/16    Authorization Type  UHC--no visit limit PT/OT (20 visit limit for cognitive therapy)    OT Start Time  1318    OT Stop Time  1400    OT Time Calculation (min)  42 min    Activity Tolerance  Patient tolerated treatment well    Behavior During Therapy  [facility] for tasks assessed/performed       Past Medical History:  Diagnosis Date  . MRSA (methicillin resistant Staphylococcus aureus) 2005   leg  . Neurofibromatosis    tumor down spine and brain    Past Surgical History:  Procedure Laterality Date  . hydrocephalus     [place] 1999[place]  . NECK SURGERY      tumor removal  . SPINE SURGERY  10/2013   c2-t2 fusion  , laminectomy c1-6-- Dr Rigoberto Noel-- [place]  . VASECTOMY  02/2013    There were no vitals filed for this visit.  Subjective Assessment - 11/26/16 1543    Subjective   Pt reports that working from home is going well this week.    Patient is accompained by:  Family member    Pertinent History  debulking of cervical spine tumor/Cervical athrodesisdiagnosed at young age with neurofibromatosis, Horners Syndrome, and hydrocephalus, numerous cervical surgeries over the years (6-7)    Limitations  fall risk, no lifting >8lbs (gallon of milk), avoid overhead lifting, no looking up/down with neck, driving     Patient Stated Goals  improved coordination/strength LUE, improve foot drop    Currently in Pain?  Yes    Pain Score  5     Pain Location  Neck    Pain Descriptors / Indicators  Aching;Pressure    Pain Type   Chronic pain    Pain Onset  More than a month ago    Pain Frequency  Constant    Aggravating Factors   incr activity    Pain Relieving Factors  pain meds        Checked goals and discuss progress--see goals section below.  Pt verbalizes desire to d/c OT and continue with HEP so that he only has to schedule PT around work schedule.  Recommended pt follow-up with MD as PT order hasn't been received yet.  Pt verbalized understanding.                     OT Education - 11/26/16 1547    Education Details  Updated putty HEP to green; updated theraband HEP to red (pt instructed to perform with red as long as he isn't too sore, if sore, he can incr reps with yellow band for additional week); pt also instructed/cued to relax neck during UE exercises and to avoid overhead weights (continue with band exercises)    Person(s) Educated  Patient    Methods  Explanation;Demonstration    Comprehension  Verbalized understanding;Returned demonstration       OT Short Term Goals - 11/16/16 1340      OT SHORT TERM GOAL #1   Title  Pt will be I with HEP - check STGs 11/20/16    Time  4    Period  Weeks    Status  Achieved 11/12/16 met      OT SHORT TERM GOAL #2   Title  Pt will demonstrate improved coordination for ADLs as evidenced by decreasing time on 9-hole peg test by at least 10sec with LUE.    Baseline  62.06sec    Time  4    Period  Weeks    Status  Achieved 11/16/16:  37.60sec      OT SHORT TERM GOAL #3   Title  Pt will perform bathing mod I.    Time  4    Period  Weeks    Status  Achieved 11/12/16  met      OT SHORT TERM GOAL #4   Title  Pt will be mod I with tying shoes with AE prn    Time  4    Period  Weeks    Status  Achieved 11/16/16:  met with min difficulty due to neck discomfort      OT SHORT TERM GOAL #5   Title  Pt will improve L hand grip strength by at least 6lbs to assist with opening containers.    Baseline  L-26lbs    Time  4    Status  Achieved  11/16/16:  38lbs        OT Long Term Goals - 11/26/16 1327      OT LONG TERM GOAL #1   Title  Pt will be I with upgraded HEP prn - check LTGs 12/20/16    Status  Achieved      OT LONG TERM GOAL #2   Title  Pt will demonstrate improved coordination for ADLs as evidenced by decreasing time on 9-hole peg test by at least 10sec with LUE.    Period  Weeks    Status  Achieved 11/26/16:  37.06sec      OT LONG TERM GOAL #3   Title  Pt will perform previous light cooking/cleaning tasks mod I.    Status  Achieved      OT LONG TERM GOAL #4   Title  Patient will be able to type with both hands within reasonable period of time in preparation to returning to work.    Status  Achieved 11/26/16 per pt report      OT LONG TERM GOAL #5   Title  Pt will improve L hand grip strength by at least 12lbs to assist with opening containers.    Baseline  26lbs    Status  Achieved 11/26/16: 48lbs      OT LONG TERM GOAL #6   Title  Pt will improve ability to fasten buttons as shown by being able to fasten/unfasten 3 buttons in less than 50sec.    Baseline  64.94    Time  8    Period  Weeks    Status  Partially Met 11/26/16:  51.96, 41.93sec            Plan - 11/26/16 1546    Clinical Impression Statement  Pt has made good progress and reports returning to work part-time this week.  Pt is pleased with current progress and is appropriate for d/c at this time.    Rehab Potential  Good    Current Impairments/barriers affecting progress:  chronic neck pain, hx of multiple cervical surgeries    OT Frequency  2x / week  OT Duration  8 weeks    OT Treatment/Interventions  Cryotherapy;Parrafin;Therapeutic exercise;DME and/or AE instruction;Therapist, nutritional;Therapeutic activities;Patient/family education;Manual Therapy;Neuromuscular education;Fluidtherapy;Self-care/ADL training;Energy conservation;Passive range of motion;Therapeutic exercises;Moist Heat    Plan  OT d/c    OT Home Exercise  Plan  Education provided:  yellow putty HEP; coordination HEP    Consulted and Agree with Plan of Care  Patient       Patient will benefit from skilled therapeutic intervention in order to improve the following deficits and impairments:  Decreased activity tolerance, Decreased coordination, Decreased knowledge of use of DME, Decreased strength, Impaired UE functional use, Pain, Decreased range of motion, Decreased balance, Decreased mobility, Impaired sensation  Visit Diagnosis: Other lack of coordination  Other disturbances of skin sensation  Muscle weakness (generalized)  Stiffness of left shoulder, not elsewhere classified  Unsteadiness on feet    Problem List Patient Active Problem List   Diagnosis Date Noted  . Weakness of left side of body 11/19/2016  . Stress due to illness of family member 06/10/2016  . Syncope 04/23/2016  . Onychomycosis 04/23/2016  . Cerumen impaction 04/23/2016  . Routine general medical examination at a health care facility 06/13/2014  . Constipation due to opioid therapy 06/13/2014  . Chronic pain syndrome 02/21/2014  . Allergic reaction 02/20/2014  . Neurofibromatosis, type 1 ([place]) 12/14/2013  . Preventative health care 06/23/2010  . HORNER'S SYNDROME 04/12/2009   OCCUPATIONAL THERAPY DISCHARGE SUMMARY  Visits from Start of Care: 9  Current functional level related to goals / functional outcomes: See above   Remaining deficits: decr strength, decr sensation, decr coordination  Pt awaiting MD order from physical therapy to address gait/balance changes.   Education / Equipment: Pt instructed in HEP and verbalized understanding of all education provided.  Plan: Patient agrees to discharge.  Patient goals were partially met. Patient is being discharged due to being pleased with the current functional level.  ?????        [facility] 11/26/2016, 3:50 PM  [facility] [address] [place], [place], [address] Phone: [phone]   Fax:  [phone]  Name: Larry Riley MRN: [id number] Date of Birth: 1980/11/15   Vianne Bulls, OTR/L [facility] [address] [place], [place]  [address] [phone] phone [phone] 11/26/16 3:50 PM

## 2016-11-27 NOTE — Telephone Encounter (Signed)
Pt verbalize understand of test result.   

## 2016-12-01 ENCOUNTER — Encounter: Payer: Self-pay | Admitting: Physical Therapy

## 2016-12-01 ENCOUNTER — Ambulatory Visit: Payer: 59 | Admitting: Physical Therapy

## 2016-12-01 DIAGNOSIS — M6281 Muscle weakness (generalized): Secondary | ICD-10-CM | POA: Diagnosis not present

## 2016-12-01 DIAGNOSIS — R2689 Other abnormalities of gait and mobility: Secondary | ICD-10-CM

## 2016-12-01 DIAGNOSIS — R293 Abnormal posture: Secondary | ICD-10-CM

## 2016-12-01 DIAGNOSIS — R29898 Other symptoms and signs involving the musculoskeletal system: Secondary | ICD-10-CM

## 2016-12-01 NOTE — Patient Instructions (Signed)
Gastroc / Heel Cord Stretch - On Step    Stand with heels over edge of stair. Holding rail, lower heels until stretch is felt in calf of legs. Repeat _1-2__ times. Do _1-2__ times per day. HOLD 45- 60 secs  Copyright  VHI. All rights reserved.  Strengthening: Hip Extension - Resisted    With tubing around right ankle, face anchor and pull leg straight back. Repeat _10___ times per set. Do _3___ sets per session. Do __1__ sessions per day.  http://orth.exer.us/636   Copyright  VHI. All rights reserved.  HIP: Abduction - Side-Lying    Lie on side, legs straight and in line with trunk. Squeeze glutes. Raise top leg up and slightly back. Point toes forward. __10_ reps per set, _3__ sets per day, __5_ days per week Bend bottom leg to stabilize pelvis.  Copyright  VHI. All rights reserved.  Heel Raises    Stand with support. Tighten pelvic floor and hold. With knees straight, raise heels off ground. Hold _3__ seconds. Relax for _2__ seconds. Repeat _10-15__ times. Do 2___ times a day.  TRY to get more weight on Left leg than on Rt leg  Copyright  VHI. All rights reserved.

## 2016-12-02 ENCOUNTER — Encounter: Payer: Self-pay | Admitting: Physical Therapy

## 2016-12-02 NOTE — Therapy (Signed)
[facility] [address] [place], [place], [address] Phone: [phone]   Fax:  [phone]  Physical Therapy Evaluation  Patient Details  Name: Larry Riley MRN: [id number] Date of Birth: 09/01/1980 Referring Provider: Dr. Mindi Junker   Encounter Date: 12/01/2016  PT End of Session - 12/02/16 1945    Visit Number  1    Number of Visits  9    Date for PT Re-Evaluation  12/31/16    Authorization Type  UHC    Authorization - Visit Number  1    Authorization - Number of Visits  20    PT Start Time  1401    PT Stop Time  1446    PT Time Calculation (min)  45 min       Past Medical History:  Diagnosis Date  . MRSA (methicillin resistant Staphylococcus aureus) 2005   leg  . Neurofibromatosis    tumor down spine and brain    Past Surgical History:  Procedure Laterality Date  . hydrocephalus     [place]unt [place]  . NECK SURGERY      tumor removal  . SPINE SURGERY  10/2013   c2-t2 fusion  , laminectomy c1-6-- Dr Raynald Kemp-- [place]  . VASECTOMY  02/2013    There were no vitals filed for this visit.   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 Mindi Junker    Prior Therapy  at this facility in Nov. 2015;  pt has just completed OT at this facility and discharged earlier this week       Precautions   Precautions  Fall    Precaution Comments  no lifting >25lbs overhead, no restrictions on neck movement, return to work/driving as pt feels able, use pain as a guide      Balance Screen   Has the patient fallen in the past 6 months  Yes    How many times?  4 none since 09-30-16    Has the patient had a decrease in activity level because of a fear of falling?   No    Is the patient reluctant to leave their home because of a fear of falling?   No      Home Environment   Living Environment  Private residence    Type of Home  House    Home Access  Stairs to enter    Entrance Stairs-Number of Steps  1    Entrance Stairs-Rails  Right    Home Layout  One level      Prior Function   Level of Independence  Independent    Vocation  Full time employment    Therapist, nutritional   Overall Strength  Deficits  Right/Left Hip  Left    Left Hip Flexion  4/5    Left Hip Extension  3-/5    Left Hip ABduction  3+/5    Right/Left Knee  Left    Left Knee Flexion  4/5    Left Knee Extension  4+/5    Right/Left Ankle  Left    Left Ankle Dorsiflexion  2-/5    Left Ankle Plantar Flexion  2/5    Left Ankle Inversion  3/5    Left Ankle Eversion  2+/5      Transfers   Transfers  Sit to Stand    Sit to Stand  6: Modified independent (Device/Increase time)    Five time sit to stand comments   14.10 secs without UE support    Number of Reps  --      Ambulation/Gait   Ambulation/Gait  Yes    Ambulation/Gait Assistance  6: Modified independent (Device/Increase time)    Assistive device  None    Gait Pattern  Decreased dorsiflexion - left;Decreased step length - left;Decreased weight shift to left;Poor foot clearance - left    Ambulation Surface  Level;Indoor    Gait velocity  32.8/10.63 = 3.08 ft/sec      Standardized Balance Assessment   Standardized Balance Assessment  Timed Up and Go Test      Timed Up and Go Test   Normal TUG (seconds)   10.88             Objective measurements completed on examination: See above findings.              PT Education - 12/02/16 1943    Education provided  Yes    Education Details  HEP for LLE strengthening - green theraband given for resisted hip extension in standing position    Person(s) Educated  Patient    Methods  Explanation;Demonstration;Handout    Comprehension  Verbalized understanding;Returned demonstration hip extension with band not demo'd due to time constraint          PT Long Term Goals - 12/02/16 2000      PT LONG TERM GOAL #1   Title  Amb. 1000' on all surfaces with AFO on LLE modified independently.    Baseline  pt not wearing AFO - 12-01-16    Time  4    Period  Weeks    Status  New    Target Date  12/31/16      PT LONG TERM GOAL #2   Title  Independent with HEP for LLE strengthening and cervical ROM/stretching    Time  4    Period  Weeks    Status  New    Target Date  12/31/16      PT LONG TERM GOAL #3   Title  Improve TUG score from 10.88 secs to </= 9 secs with use of AFO on LLE to achieve prior functional status with mobility.     Baseline  10.88 secs on 12-01-16    Time  4    Period  Weeks    Status  New    Target Date  12/31/16      PT LONG TERM GOAL #4   Title  Improve 5x sit to stand score from 14.10 secs to </= 11 secs without UE support from standard chair.    Baseline  14.10 secs without UE support on standard chair    Time  4    Period  Weeks  Status  New    Target Date  12/31/16      PT LONG TERM GOAL #5   Title  Assess use and benefit of Bioness unit for LLE if pt is determined to be appropriate candidate for use of this modality.    Time  4    Period  Weeks    Status  New    Target Date  12/31/16             Plan - 12/02/16 1947    Clinical Impression Statement  Pt is a 36 yr old male with neurofibromatosis type 1.  Pt underwent cervical tumor resection on 09-30-16 with cervical laminectomy C1-2 for  intradural, extramedullary tumor and also for untethering of cord.  Pt presents with LLE weakness which he states has increased since the recent surgery.  Pt presents with drop foot but has not been wearing his AFO which he had following the surgery in 2015.  Pt presents with gait , strength and balance deficits and also with cervical ROM deficits and postural abnormality.                                                           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 on 09-30-16    Clinical Decision Making  Moderate    Rehab Potential  Good    PT Frequency  2x / week    PT Duration  4 weeks    PT Treatment/Interventions  ADLs/Self Care Home Management;Electrical Stimulation;Stair training;Gait training;Therapeutic activities;Therapeutic exercise;Balance training;Neuromuscular re-education;Patient/family education;Orthotic Fit/Training;Passive range of motion;Manual techniques    PT Next Visit Plan  check HEP given on 12-01-16:  add LLE strengthening exercises as appropriate - needs Lt knee extension control with theraband ex in standing:  add cervical ROM/stretching    PT Home Exercise Plan  see pt instructions    Recommended Other Services  TENS unit for pain management?    Consulted and Agree with Plan of Care  Patient       Patient will benefit from skilled therapeutic intervention in order to improve the following deficits and impairments:  Abnormal gait, Decreased balance, Decreased coordination, Decreased range of motion, Decreased strength, Impaired flexibility, Pain, Impaired UE functional use, Postural dysfunction  Visit Diagnosis: Muscle weakness (generalized) - Plan: PT plan of care cert/re-cert  Other abnormalities of  gait and mobility - Plan: PT plan of care cert/re-cert  Abnormal posture - Plan: PT plan of care cert/re-cert  Other symptoms and signs involving the musculoskeletal system - Plan: PT plan of care cert/re-cert     Problem List Patient Active Problem List   Diagnosis Date Noted  . Weakness of left side of body 11/19/2016  . Stress due to illness of family member 06/10/2016  . Syncope 04/23/2016  . Onychomycosis 04/23/2016  . Cerumen impaction 04/23/2016  . Routine general medical examination at a health care facility 06/13/2014  . Constipation due to opioid therapy 06/13/2014  . Chronic pain syndrome 02/21/2014  . Allergic reaction 02/20/2014  .  Neurofibromatosis, type 1 (HCC) 12/14/2013  . Preventative health care 06/23/2010  . [facility]    Kary KosDilday, Leonor Darnell Suzanne, PT 12/02/2016, 8:18 PM  [facility] [address] [place], [place], [address] Phone: [phone]   Fax:  [phone]  Name: Larry HammondJoshua Riley MRN: [id number] Date of Birth: 1980/09/18

## 2016-12-08 ENCOUNTER — Encounter: Payer: Self-pay | Admitting: Physical Therapy

## 2016-12-08 ENCOUNTER — Ambulatory Visit: Payer: 59 | Admitting: Physical Therapy

## 2016-12-08 DIAGNOSIS — M6281 Muscle weakness (generalized): Secondary | ICD-10-CM | POA: Diagnosis not present

## 2016-12-08 DIAGNOSIS — R2689 Other abnormalities of gait and mobility: Secondary | ICD-10-CM

## 2016-12-08 NOTE — Patient Instructions (Addendum)
Clam Shell 45 Degrees    Lying with hips and knees bent 45, one pillow between knees and ankles. Lift knee. Be sure pelvis does not roll backward. Do not arch back. Do _15__ times, each leg, __3_ times per day. LIE ON RIGHT SIDE - LIFT LEFT LEG UP - KEEP FEET TOGETHER -  2# weight used today http://ss.exer.us/74   Copyright  VHI. All rights reserved.  Added knee extension control exercise for LLE - with blue theraband - 15 reps each position -

## 2016-12-08 NOTE — Therapy (Signed)
[facility] [address] [place], [place], [address] Phone: [phone]   Fax:  [phone]  Physical Therapy Treatment  Patient Details  Name: Larry Riley MRN: [id number] Date of Birth: 11/09/1980 Referring Provider: Dr. Mindi JunkerWesley Hsu   Encounter Date: 12/08/2016  PT End of Session - 12/08/16 1940    Visit Number  2    Number of Visits  9    Date for PT Re-Evaluation  12/31/16    Authorization Type  UHC    Authorization - Visit Number  2    Authorization - Number of Visits  20    PT Start Time  0805    PT Stop Time  0848    PT Time Calculation (min)  43 min       Past Medical History:  Diagnosis Date  . MRSA (methicillin resistant Staphylococcus aureus) 2005   leg  . Neurofibromatosis    tumor down spine and brain    Past Surgical History:  Procedure Laterality Date  . hydrocephalus     [place]unt 1999[place]  . NECK SURGERY      tumor removal  . SPINE SURGERY  10/2013   c2-t2 fusion  , laminectomy c1-6-- [facility]-- [place]  . VASECTOMY  02/2013    There were no vitals filed for this visit.  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  Yes    Pain Score  5     Pain Location  Neck    Pain Orientation  Right;Left    Pain Descriptors / Indicators  Aching    Pain Type  Chronic pain    Pain Onset  More than a month ago    Pain Frequency  Constant              [facility] Adult PT Treatment/Exercise - 12/08/16 1700      Ambulation/Gait   Ambulation/Gait  Yes    Ambulation/Gait Assistance  6: Modified independent (Device/Increase time)    Ambulation Distance (Feet)  125 Feet    Assistive device  None    Gait Pattern  Step-through pattern;Decreased dorsiflexion - right;Right genu recurvatum    Ambulation Surface  Level;Indoor      Exercises   Exercises  Lumbar;Knee/Hip;Ankle      Knee/Hip Exercises: Aerobic   Recumbent Bike  SciFit level 2.5 x 5"      Knee/Hip Exercises: Standing   Other Standing Knee Exercises  Pt performed Lt knee extension control exercise with blue theraband - 3 positions-- 10-15 reps each position        Knee/Hip Exercises: Supine   Bridges  AROM;Strengthening;1 set;10 reps    Other Supine Knee/Hip Exercises  1/2 bridges 10 reps;  bridge with RLE extension 10 reps       Knee/Hip Exercises: Sidelying   Hip ABduction  Strengthening;1 set;10 reps;Left 2# 8 reps;  2 reps no weight    Clams  10x  2# weight LLE      Knee/Hip Exercises: Prone   Hamstring  Curl  10 reps;1 set 2# LLE    Other Prone Exercises  hip extension with knee flexed - no weight 10 reps LLE      Ankle Exercises: Standing   Other Standing Ankle Exercises  Lt plantarflexion in closed chain position off side of hi/low table x 10 reps             PT Education - 12/08/16 1940    Education provided  Yes    Education Details  added Lt knee extension control  ex with blue band and clam shell exercise    Person(s) Educated  Patient    Methods  Explanation;Demonstration;Handout    Comprehension  Verbalized understanding;Returned demonstration          PT Long Term Goals - 12/02/16 2000      PT LONG TERM GOAL #1   Title  Amb. 1000' on all surfaces with AFO on LLE modified independently.    Baseline  pt not wearing AFO - 12-01-16    Time  4    Period  Weeks    Status  New    Target Date  12/31/16      PT LONG TERM GOAL #2   Title  Independent with HEP for LLE strengthening and cervical ROM/stretching    Time  4     Period  Weeks    Status  New    Target Date  12/31/16      PT LONG TERM GOAL #3   Title  Improve TUG score from 10.88 secs to </= 9 secs with use of AFO on LLE to achieve prior functional status with mobility.     Baseline  10.88 secs on 12-01-16    Time  4    Period  Weeks    Status  New    Target Date  12/31/16      PT LONG TERM GOAL #4   Title  Improve 5x sit to stand score from 14.10 secs to </= 11 secs without UE support from standard chair.    Baseline  14.10 secs without UE support on standard chair    Time  4    Period  Weeks    Status  New    Target Date  12/31/16      PT LONG TERM GOAL #5   Title  Assess use and benefit of Bioness unit for LLE if pt is determined to be appropriate candidate for use of this modality.    Time  4    Period  Weeks    Status  New    Target Date  12/31/16            Plan - 12/08/16 1942    Clinical Impression Statement  Pt's Ottobock is not controlling Lt knee hyperextension and is not fitting pt properly as he has taped the lateral strut due to rubbing his ankle.  Pt would benefit from a new AFO (possibly custom plastic to control hyperextension as well as to prevent foot drop).  Will contact [employer] for consult.    Rehab Potential  Good    PT Frequency  2x / week    PT Duration  4 weeks    PT Treatment/Interventions  ADLs/Self Care Home Management;Electrical Stimulation;Stair training;Gait training;Therapeutic activities;Therapeutic exercise;Balance training;Neuromuscular re-education;Patient/family education;Orthotic Fit/Training;Passive range of motion;Manual techniques    PT Next Visit Plan  check Lt knee extension control exercise added to HEP:  cont LLE strengthening - especially Lt plantarflexors, hip abductors and extensors ;  add cervical ROM/gentle stretching    PT Home Exercise Plan  see pt instructions    Consulted and Agree with Plan of Care  Patient       Patient will benefit from skilled therapeutic intervention in  order to improve the following deficits and impairments:  Abnormal gait, Decreased balance, Decreased coordination, Decreased range of motion, Decreased strength, Impaired flexibility, Pain, Impaired UE functional use, Postural dysfunction  Visit Diagnosis: Other abnormalities of gait and mobility  Muscle weakness (generalized)     Problem List Patient Active Problem List   Diagnosis Date Noted  . Weakness of left side of body 11/19/2016  . Stress due to illness of family member 06/10/2016  . Syncope 04/23/2016  . Onychomycosis 04/23/2016  . Cerumen impaction 04/23/2016  . Routine general medical examination at a health care facility 06/13/2014  . Constipation due to opioid therapy 06/13/2014  . Chronic pain syndrome 02/21/2014  . Allergic reaction 02/20/2014  . Neurofibromatosis, type 1 (HCC) 12/14/2013  . Preventative health care 06/23/2010  . [facility]/12/[facility]    Kary KosDilday, Azzam Mehra Suzanne, PT 12/08/2016, 7:49 PM  [facility] [address] [place], [place], [address] Phone: [phone]   Fax:  [phone]  Name: Larry HammondJoshua Riley MRN: [id number] Date of Birth: September 03, 1980

## 2016-12-10 ENCOUNTER — Encounter: Payer: Self-pay | Admitting: Podiatry

## 2016-12-11 ENCOUNTER — Ambulatory Visit: Payer: 59 | Admitting: Physical Therapy

## 2016-12-11 ENCOUNTER — Encounter: Payer: Self-pay | Admitting: Physical Therapy

## 2016-12-11 DIAGNOSIS — M6281 Muscle weakness (generalized): Secondary | ICD-10-CM

## 2016-12-11 DIAGNOSIS — R29898 Other symptoms and signs involving the musculoskeletal system: Secondary | ICD-10-CM

## 2016-12-11 DIAGNOSIS — R293 Abnormal posture: Secondary | ICD-10-CM

## 2016-12-11 NOTE — Patient Instructions (Addendum)
   Before stretching, warm your muscles up (ie. Warm shower, heating pad, arm exercises). When lying down, use pillow plus folded towel under head/neck to support head enough that you can relax muscles.      Head Tilt (Neck Stretch / Mobility)    Lying down, tilt head to side as if touching earlobe to top of your shoulder. Hold _30__ seconds, breathing slowly in and out. Repeat to opposite side.  Repeat sequence __3_ times. Do __2_ sessions per day.  Progression will be   Copyright  VHI. All rights reserved.   Neck Rotation    Lying down, slowly turn head to look over shoulder, keeping head centered, shoulders and torso stationary. Hold __30__ seconds. Slowly return to starting position. Repeat to other side. Repeat __3__ times to each side. Repeat twice per day.   Copyright  VHI. All rights reserved.    Head Press With Chin Tuck    Tuck chin SLIGHTLY toward chest, keep mouth closed. Feel back of neck lengthening as it presses down towards the bed. Hold __30 seconds. Relax. Repeat _3__ times.  Copyright  VHI. All rights reserved.     Neck Flexion    Begin with chin level, head centered over spine. Slowly lower chin toward chest. Hold _30___ seconds. Slowly return to starting position. Repeat __3__ times.  Copyright  VHI. All rights reserved.     Shoulder rolls: shoulders up to ears, roll back and down with shoulder blades pinching together, and then relax. X 10-20 reps (good to warm up muscles actively).

## 2016-12-11 NOTE — Therapy (Signed)
[facility] [address] [place], [place], [address] Phone: [phone]   Fax:  [phone]  Physical Therapy Treatment  Patient Details  Name: Larry Riley MRN: [id number] Date of Birth: 1980-06-01 Referring Provider: Dr. Mindi Junker   Encounter Date: 12/11/2016  PT End of Session - 12/11/16 1903    Visit Number  3    Number of Visits  9    Date for PT Re-Evaluation  12/31/16    Authorization Type  UHC    Authorization - Visit Number  3    Authorization - Number of Visits  20    PT Start Time  0801    PT Stop Time  0842    PT Time Calculation (min)  41 min    Activity Tolerance  Patient tolerated treatment well    Behavior During Therapy  [facility] for tasks assessed/performed       Past Medical History:  Diagnosis Date  . MRSA (methicillin resistant Staphylococcus aureus) 2005   leg  . Neurofibromatosis    tumor down spine and brain    Past Surgical History:  Procedure Laterality Date  . hydrocephalus     [place]unt 1999[place]  . NECK SURGERY      tumor removal  . SPINE SURGERY  10/2013   c2-t2 fusion  , laminectomy c1-6-- Dr Raynald Kemp-- [place]  . VASECTOMY  02/2013    There were no vitals filed for this visit.  Subjective Assessment - 12/11/16 0805    Subjective  My shoulders and neck are stiff. Every other day I'm still having to take Tylenol due to pain at surgery site. Did exercises once since last session.     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  Yes    Pain Score  5     Pain Location  Neck    Pain Orientation  Right;Left    Pain Descriptors / Indicators  Aching    Pain Type  Chronic pain    Pain Onset  More than a month ago                       [facility] Adult PT Treatment/Exercise - 12/11/16 0001      Exercises   Exercises  Other Exercises    Other Exercises   See HEP issued today; pt educated and performed all exercises      Modalities   Modalities  Moist Heat      Moist Heat Therapy   Number Minutes Moist Heat  5 Minutes    Moist Heat Location  Cervical      Manual Therapy   Manual Therapy  Soft tissue mobilization    Manual therapy comments  trapezius and cervical paraspinals             PT Education - 12/11/16 1903    Education provided  Yes    Education Details  see additions to HEP    Person(s) Educated  Patient    Methods  Explanation;Demonstration;Verbal cues;Tactile cues;Handout    Comprehension  Verbalized understanding;Returned demonstration;Verbal cues required;Tactile cues required;Need further instruction          PT Long Term Goals - 12/02/16 2000      PT LONG TERM  GOAL #1   Title  Amb. 1000' on all surfaces with AFO on LLE modified independently.    Baseline  pt not wearing AFO - 12-01-16    Time  4    Period  Weeks    Status  New    Target Date  12/31/16      PT LONG TERM GOAL #2   Title  Independent with HEP for LLE strengthening and cervical ROM/stretching    Time  4    Period  Weeks    Status  New    Target Date  12/31/16      PT LONG TERM GOAL #3   Title  Improve TUG score from 10.88 secs to </= 9 secs with use of AFO on LLE to achieve prior functional status with mobility.     Baseline  10.88 secs on 12-01-16    Time  4    Period  Weeks    Status  New    Target Date  12/31/16      PT LONG TERM GOAL #4   Title  Improve 5x sit to stand score from 14.10 secs to </= 11 secs without UE support from standard chair.    Baseline  14.10 secs without UE support on standard chair    Time  4    Period  Weeks    Status  New    Target Date  12/31/16      PT LONG TERM GOAL #5   Title  Assess use and benefit of Bioness unit for LLE if pt is  determined to be appropriate candidate for use of this modality.    Time  4    Period  Weeks    Status  New    Target Date  12/31/16            Plan - 12/11/16 1904    Clinical Impression Statement  Educated patient on potential benefits of Bioness FES for LLE weakness. Pt educated to bring shorts to PT next visit and can attempt Bioness. Patient tolerated all stretches and exercises instructed in today and reported pain had decreased from 5 to 4 out of 10 by end of session. Patient is pleased with his progress.     Rehab Potential  Good    PT Frequency  2x / week    PT Duration  4 weeks    PT Treatment/Interventions  ADLs/Self Care Home Management;Electrical Stimulation;Stair training;Gait training;Therapeutic activities;Therapeutic exercise;Balance training;Neuromuscular re-education;Patient/family education;Orthotic Fit/Training;Passive range of motion;Manual techniques    PT Next Visit Plan  try bioness cuff and thigh component to control genu recurvatum; cont LLE strengthening - especially Lt plantarflexors, hip abductors and extensors ;    PT Home Exercise Plan  see pt instructions    Consulted and Agree with Plan of Care  Patient       Patient will benefit from skilled therapeutic intervention in order to improve the following deficits and impairments:  Abnormal gait, Decreased balance, Decreased coordination, Decreased range of motion, Decreased strength, Impaired flexibility, Pain, Impaired UE functional use, Postural dysfunction  Visit Diagnosis: Muscle weakness (generalized)  Abnormal posture  Other symptoms and signs involving the musculoskeletal system     Problem List Patient Active Problem List   Diagnosis Date Noted  . Weakness of left side of body 11/19/2016  . Stress due to illness of family member 06/10/2016  . Syncope 04/23/2016  . Onychomycosis 04/23/2016  . Cerumen impaction 04/23/2016  . Routine general medical examination at  a health care facility  06/13/2014  . Constipation due to opioid therapy 06/13/2014  . Chronic pain syndrome 02/21/2014  . Allergic reaction 02/20/2014  . Neurofibromatosis, type 1 (HCC) 12/14/2013  . Preventative health care 06/23/2010  . HORNER'S SYNDROME 04/12/2009    Zena AmosLynn P Diani Jillson, PT 12/11/2016, 7:10 PM  [facility] [address] [place], [place], [address] Phone: [phone]   Fax:  [phone]  Name: Sandrea HammondJoshua Lekas MRN: [id number] Date of Birth: [birth year]

## 2016-12-15 ENCOUNTER — Ambulatory Visit: Payer: 59 | Attending: Nurse Practitioner | Admitting: Physical Therapy

## 2016-12-15 ENCOUNTER — Encounter: Payer: Self-pay | Admitting: Physical Therapy

## 2016-12-15 VITALS — BP 135/90 | HR 107

## 2016-12-15 DIAGNOSIS — R29898 Other symptoms and signs involving the musculoskeletal system: Secondary | ICD-10-CM | POA: Diagnosis present

## 2016-12-15 DIAGNOSIS — M6281 Muscle weakness (generalized): Secondary | ICD-10-CM

## 2016-12-15 DIAGNOSIS — R2681 Unsteadiness on feet: Secondary | ICD-10-CM | POA: Diagnosis present

## 2016-12-15 DIAGNOSIS — R2689 Other abnormalities of gait and mobility: Secondary | ICD-10-CM | POA: Diagnosis present

## 2016-12-15 NOTE — Therapy (Signed)
[facility] [address] [place], [place], [address] Phone: [phone]   Fax:  [phone]  Physical Therapy Treatment  Patient Details  Name: Larry HammondJoshua Riley MRN: [id number] Date of Birth: [birth year] Referring Provider: Dr. Mindi JunkerWesley Hsu   Encounter Date: 12/15/2016  PT End of Session - 12/15/16 1010    Visit Number  4    Number of Visits  9    Date for PT Re-Evaluation  12/31/16    Authorization Type  UHC    Authorization - Visit Number  3    Authorization - Number of Visits  20    PT Start Time  [phone]    PT Stop Time  0933    PT Time Calculation (min)  47 min    Activity Tolerance  Treatment limited secondary to medical complications (Comment) hypotensive; see vitals    Behavior During Therapy  [facility] for tasks assessed/performed       Past Medical History:  Diagnosis Date  . MRSA (methicillin resistant Staphylococcus aureus) 2005   leg  . Neurofibromatosis    tumor down spine and brain    Past Surgical History:  Procedure Laterality Date  . hydrocephalus     [place]unt 1999[place]  . NECK SURGERY      tumor removal  . SPINE SURGERY  10/2013   c2-t2 fusion  , laminectomy c1-6-- [facility]-- [place]  . VASECTOMY  02/2013    Vitals:   12/15/16 0924 12/15/16 0931 12/15/16 0936 12/15/16 0937  BP: 124/87 125/84 115/88 135/90  Pulse: 88 93 (!) 107     Subjective Assessment - 12/15/16 1003    Subjective  Patient agrees to trial of e-stim via Bioness for LLE. Denies heart arrhythmia, h/o seizures, metal implant in LLE. When pt became faint feeling, "I've done this several times before... when I get really focused on my medical issues I get faint feeling. 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  No/denies    Pain Onset  More than a month ago                      [facility]/Ex[facility] - 12/15/16 0001      Modalities   Modalities  Electrical Stimulation      Electrical Stimulation   Electrical Stimulation Location  LLE    Electrical Stimulation Action  dorsiflexion    Electrical Stimulation Parameters  setting up Bioness lower leg cuff, pt tolerated up to 18 with slight DF elicited; pt then became faint feeling, hypotensive and estim discontinued    Electrical Stimulation Goals  Neuromuscular facilitation             PT Education - 12/15/16 1010    Education provided  Yes    Education Details  basics of e-stim and goal for use of Bioness    Person(s) Educated  Patient    Methods  Explanation;Demonstration    Comprehension  Verbalized understanding;Need further instruction          PT Long Term Goals - 12/02/16 2000      PT LONG TERM GOAL #1   Title  Amb. 1000' on  all surfaces with AFO on LLE modified independently.    Baseline  pt not wearing AFO - 12-01-16    Time  4    Period  Weeks    Status  New    Target Date  12/31/16      PT LONG TERM GOAL #2   Title  Independent with HEP for LLE strengthening and cervical ROM/stretching    Time  4    Period  Weeks    Status  New    Target Date  12/31/16      PT LONG TERM GOAL #3   Title  Improve TUG score from 10.88 secs to </= 9 secs with use of AFO on LLE to achieve prior functional status with mobility.     Baseline  10.88 secs on 12-01-16    Time  4    Period  Weeks    Status  New    Target Date  12/31/16      PT LONG TERM GOAL #4   Title  Improve 5x sit to stand score from 14.10 secs to </= 11 secs without UE support from standard chair.    Baseline  14.10 secs without UE support on standard chair    Time  4    Period  Weeks    Status  New    Target Date  12/31/16      PT LONG TERM  GOAL #5   Title  Assess use and benefit of Bioness unit for LLE if pt is determined to be appropriate candidate for use of this modality.    Time  4    Period  Weeks    Status  New    Target Date  12/31/16            Plan - 12/15/16 1913    Clinical Impression Statement  Patient initiated trial of Bioness e-stim for LLE, however only tolerated 4 rounds trying to establish an appropriate intensity to elicit enough dorsiflexion. He then reported dizziness and requested to lie down. See vitals for pt's hypotension and initial bradycardia. Required constant monitoring with ultimately needing legs elevated above heart to stabilze BP. Slowly progressed to sitting and standing by end of session with BP maintaining and pt was able to walk.     Rehab Potential  Good    PT Frequency  2x / week    PT Duration  4 weeks    PT Treatment/Interventions  ADLs/Self Care Home Management;Electrical Stimulation;Stair training;Gait training;Therapeutic activities;Therapeutic exercise;Balance training;Neuromuscular re-education;Patient/family education;Orthotic Fit/Training;Passive range of motion;Manual techniques    PT Next Visit Plan  try bioness cuff and thigh component to control genu recurvatum (watch for orthostasis with e-stim);  cont LLE strengthening - especially Lt plantarflexors, hip abductors and extensors ;    PT Home Exercise Plan  see pt instructions    Consulted and Agree with Plan of Care  Patient       Patient will benefit from skilled therapeutic intervention in order to improve the following deficits and impairments:  Abnormal gait, Decreased balance, Decreased coordination, Decreased range of motion, Decreased strength, Impaired flexibility, Pain, Impaired UE functional use, Postural dysfunction  Visit Diagnosis: Muscle weakness (generalized)     Problem List Patient Active Problem List   Diagnosis Date Noted  . Weakness of left side of body 11/19/2016  . Stress due to illness of  family member 06/10/2016  . Syncope 04/23/2016  . Onychomycosis 04/23/2016  . Cerumen impaction 04/23/2016  . Routine general  medical examination at a health care facility 06/13/2014  . Constipation due to opioid therapy 06/13/2014  . Chronic pain syndrome 02/21/2014  . Allergic reaction 02/20/2014  . Neurofibromatosis, type 1 (HCC) 12/14/2013  . Preventative health care 06/23/2010  . HORNER'S SYNDROME 04/12/2009    Zena AmosLynn P Waller Marcussen, PT 12/15/2016, 7:22 PM  [facility] [address] [place], [place], [address] Phone: [phone]   Fax:  [phone]  Name: Larry HammondJoshua Abdelrahman MRN: [id number] Date of Birth: [birth year]

## 2016-12-17 ENCOUNTER — Encounter: Payer: Self-pay | Admitting: Physical Therapy

## 2016-12-17 ENCOUNTER — Ambulatory Visit: Payer: 59 | Admitting: Physical Therapy

## 2016-12-17 VITALS — BP 147/102 | HR 99

## 2016-12-17 DIAGNOSIS — M6281 Muscle weakness (generalized): Secondary | ICD-10-CM | POA: Diagnosis not present

## 2016-12-17 DIAGNOSIS — R2689 Other abnormalities of gait and mobility: Secondary | ICD-10-CM

## 2016-12-17 NOTE — Therapy (Signed)
[facility] [address] [place], [place], [address] Phone: [phone]   Fax:  [phone]  Physical Therapy Treatment  Patient Details  Name: Larry Riley MRN: [id number] Date of Birth: [birth year] Referring Provider: Dr. Mindi Junker   Encounter Date: 12/17/2016  PT End of Session - 12/17/16 1302    Visit Number  5    Number of Visits  9    Date for PT Re-Evaluation  12/31/16    Authorization Type  UHC    Authorization - Visit Number  5    Authorization - Number of Visits  20    PT Start Time  0805    PT Stop Time  0850    PT Time Calculation (min)  45 min    Activity Tolerance  Patient tolerated treatment well    Behavior During Therapy  [facility] for tasks assessed/performed       Past Medical History:  Diagnosis Date  . MRSA (methicillin resistant Staphylococcus aureus) 2005   leg  . Neurofibromatosis    tumor down spine and brain    Past Surgical History:  Procedure Laterality Date  . hydrocephalus     [place]unt 1999[place]  . NECK SURGERY      tumor removal  . SPINE SURGERY  10/2013   c2-t2 fusion  , laminectomy c1-6-- Dr Raynald Kemp-- [place]  . VASECTOMY  02/2013    Vitals:   12/17/16 0812  BP: (!) 147/102  Pulse: 99    Subjective Assessment - 12/17/16 0811    Subjective  Did OK the other day after he left (had been hypotensive during session). Reports he has been doing OK with neck ROM exercises--does frequently at his desk, but usually doesn't have heating pad.     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  No/denies stomach uncomfortable but not pain    Pain Onset  --                      [facility] Adult PT Treatment/Exercise - 12/17/16 1250       Transfers   Sit to Stand  6: Modified independent (Device/Increase time)      Ambulation/Gait   Ambulation/Gait  Yes    Ambulation/Gait Assistance  6: Modified independent (Device/Increase time)    Ambulation Distance (Feet)  480 Feet total distance; max 100 ft, frequent rest breaks for estim    Assistive device  None    Gait Pattern  Step-through pattern;Decreased dorsiflexion - left;Left genu recurvatum    Ambulation Surface  Level    Gait Comments  gait training in conjunction with use of FES/Bioness to control dorsiflexion and genu recurvatum on left      Electrical Stimulation   Electrical Stimulation Location  Left lower leg cuff and Rt thigh pad rotated onto hamstrings    Electrical Stimulation Action  dorsiflexion, knee flexion   314 steps; swing phase 33%, stance phase 67% (goal is close to 50/50)            PT Education - 12/17/16 1300    Education provided  Yes    Education Details  use of Bioness (ie would replace his AFO--and potentially retrain muscles to perform better; could use with any shoe; could  go barefoot)    Person(s) Educated  Patient    Methods  Explanation;Demonstration    Comprehension  Verbalized understanding;Need further instruction          PT Long Term Goals - 12/02/16 2000      PT LONG TERM GOAL #1   Title  Amb. 1000' on all surfaces with AFO on LLE modified independently.    Baseline  pt not wearing AFO - 12-01-16    Time  4    Period  Weeks    Status  New    Target Date  12/31/16      PT LONG TERM GOAL #2   Title  Independent with HEP for LLE strengthening and cervical ROM/stretching    Time  4    Period  Weeks    Status  New    Target Date  12/31/16      PT LONG TERM GOAL #3   Title  Improve TUG score from 10.88 secs to </= 9 secs with use of AFO on LLE to achieve prior functional status with mobility.     Baseline  10.88 secs on 12-01-16    Time  4    Period  Weeks    Status  New    Target Date  12/31/16      PT  LONG TERM GOAL #4   Title  Improve 5x sit to stand score from 14.10 secs to </= 11 secs without UE support from standard chair.    Baseline  14.10 secs without UE support on standard chair    Time  4    Period  Weeks    Status  New    Target Date  12/31/16      PT LONG TERM GOAL #5   Title  Assess use and benefit of Bioness unit for LLE if pt is determined to be appropriate candidate for use of this modality.    Time  4    Period  Weeks    Status  New    Target Date  12/31/16            Plan - 12/17/16 1326    Clinical Impression Statement  Session focused on use of Bioness e-stim to lt dorsiflexors and hamstrings to assist with dorsiflexion/foot clearance and knee stability in stance. Patient tolerated the device well and anticipate he will be a good candidate for purchasing a unit for home use. Will assess again next visit and clarify patient's wishes re: a home unit.     Rehab Potential  Good    PT Frequency  2x / week    PT Duration  4 weeks    PT Treatment/Interventions  ADLs/Self Care Home Management;Electrical Stimulation;Stair training;Gait training;Therapeutic activities;Therapeutic exercise;Balance training;Neuromuscular re-education;Patient/family education;Orthotic Fit/Training;Passive range of motion;Manual techniques    PT Next Visit Plan  continue gait training  with bioness lt cuff and rt thigh component to control genu recurvatum) decide if he wants to pursue insurance approval; cont LLE strengthening - especially Lt plantarflexors, hip abductors and extensors ;    PT Home Exercise Plan  see pt instructions    Consulted and Agree with Plan of Care  Patient       Patient will benefit from skilled therapeutic intervention in order to improve the following deficits and impairments:  Abnormal gait, Decreased balance, Decreased coordination, Decreased range of motion, Decreased strength, Impaired flexibility, Pain, Impaired UE functional use, Postural  dysfunction  Visit Diagnosis: Muscle weakness (generalized)  Other abnormalities  of gait and mobility     Problem List Patient Active Problem List   Diagnosis Date Noted  . Weakness of left side of body 11/19/2016  . Stress due to illness of family member 06/10/2016  . Syncope 04/23/2016  . Onychomycosis 04/23/2016  . Cerumen impaction 04/23/2016  . Routine general medical examination at a health care facility 06/13/2014  . Constipation due to opioid therapy 06/13/2014  . Chronic pain syndrome 02/21/2014  . Allergic reaction 02/20/2014  . Neurofibromatosis, type 1 (HCC) 12/14/2013  . Preventative health care 06/23/2010  . [facility] 04[facility]    Zena AmosLynn P Danniel Grenz, PT 12/17/2016, 1:37 PM  [facility] [address] [place], [place], [address] Phone: [phone]   Fax:  [phone]  Name: Larry Riley MRN: [id number] Date of Birth: [birth year]

## 2016-12-22 ENCOUNTER — Ambulatory Visit: Payer: Self-pay | Admitting: Physical Therapy

## 2016-12-24 ENCOUNTER — Encounter: Payer: Self-pay | Admitting: Physical Therapy

## 2016-12-24 ENCOUNTER — Ambulatory Visit: Payer: 59 | Admitting: Physical Therapy

## 2016-12-24 DIAGNOSIS — M6281 Muscle weakness (generalized): Secondary | ICD-10-CM

## 2016-12-24 DIAGNOSIS — R29898 Other symptoms and signs involving the musculoskeletal system: Secondary | ICD-10-CM

## 2016-12-24 DIAGNOSIS — R2689 Other abnormalities of gait and mobility: Secondary | ICD-10-CM

## 2016-12-25 NOTE — Therapy (Signed)
[facility] [address] [place], [place], [address] Phone: [phone]   Fax:  [phone]  Physical Therapy Treatment  Patient Details  Name: Larry HammondJoshua Riley MRN: [id number] Date of Birth: February 08, 1980 Referring Provider: Dr. Mindi JunkerWesley Hsu   Encounter Date: 12/24/2016  PT End of Session - 12/24/16 0856    Visit Number  6    Number of Visits  9    Date for PT Re-Evaluation  12/31/16    Authorization Type  UHC    Authorization Time Period  Insurance calendar resets in July    Authorization - Visit Number  6    Authorization - Number of Visits  20    PT Start Time  0800    PT Stop Time  0850    PT Time Calculation (min)  50 min    Activity Tolerance  Patient tolerated treatment well    Behavior During Therapy  [facility] for tasks assessed/performed       Past Medical History:  Diagnosis Date  . MRSA (methicillin resistant Staphylococcus aureus) 2005   leg  . Neurofibromatosis    tumor down spine and brain    Past Surgical History:  Procedure Laterality Date  . hydrocephalus     [place]  . NECK SURGERY      tumor removal  . SPINE SURGERY  10/2013   c2-t2 fusion  , laminectomy c1-6-- [facility]-- [place]  . VASECTOMY  02/2013    There were no vitals filed for this visit.  Subjective Assessment - 12/24/16 1027    Subjective  Is interested in moving forward toward obtaining a Bioness unit for home use.     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  No/denies                      [facility] - 12/24/16 1028      Ambulation/Gait   Ambulation/Gait  Yes    Ambulation/Gait Assistance  5: Supervision    Ambulation/Gait  Assistance Details  gait training in conjunction with use of FES/Bioness to control dorsiflexion and genu recurvatum on left    Ambulation Distance (Feet)  120 Feet x 6; some rest breaks due to fatiguable response to bioness    Assistive device  None    Gait Pattern  Step-through pattern;Decreased dorsiflexion - left;Left genu recurvatum    Ambulation Surface  Level;Indoor    Gait Comments  Patient pleased with results of Bioness and discussed steps to seek insurance approval      Modalities   Modalities  Primary school teacherlectrical Stimulation      Electrical Stimulation   Electrical Stimulation Location  Left lower leg cuff and Rt thigh pad rotated onto hamstrings    Electrical Stimulation Action  dorsiflexion, knee flexion    Electrical Stimulation Parameters  30 eversion, 15 inversion; gait cycle 5%-30%; pulse rate 45; phase 300; used Quick-fit electrode with better dorsiflexion and good knee control but not enough eversion; switched to steering electrodes and got improved eversion and controlled eccentric contraction to prevent foot slap    Electrical Stimulation Goals  Neuromuscular facilitation             PT Education - 12/25/16 [id number]  Education provided  Yes    Education Details  overview of process for obtaining Bioness    Person(s) Educated  Patient    Methods  Explanation    Comprehension  Verbalized understanding;Need further instruction          PT Long Term Goals - 12/02/16 2000      PT LONG TERM GOAL #1   Title  Amb. 1000' on all surfaces with AFO on LLE modified independently.    Baseline  pt not wearing AFO - 12-01-16    Time  4    Period  Weeks    Status  New    Target Date  12/31/16      PT LONG TERM GOAL #2   Title  Independent with HEP for LLE strengthening and cervical ROM/stretching    Time  4    Period  Weeks    Status  New    Target Date  12/31/16      PT LONG TERM GOAL #3   Title  Improve TUG score from 10.88 secs to </= 9 secs with use of AFO on LLE  to achieve prior functional status with mobility.     Baseline  10.88 secs on 12-01-16    Time  4    Period  Weeks    Status  New    Target Date  12/31/16      PT LONG TERM GOAL #4   Title  Improve 5x sit to stand score from 14.10 secs to </= 11 secs without UE support from standard chair.    Baseline  14.10 secs without UE support on standard chair    Time  4    Period  Weeks    Status  New    Target Date  12/31/16      PT LONG TERM GOAL #5   Title  Assess use and benefit of Bioness unit for LLE if pt is determined to be appropriate candidate for use of this modality.    Time  4    Period  Weeks    Status  New    Target Date  12/31/16            Plan - 12/25/16 0719    Clinical Impression Statement  Session focused on use of Bioness e-stim to LLE to promote dorsiflexion and hamstring activity during the gait cycle for improved foot clearance and knee control. Placement of lower leg cuff/electrodes adjusted more lateral when using quickfit electrode however could not obtain strong enough dorsiflexion with eversion contraction. Switching to steering electrodes gave much stonger eversion contraction and adequate DF. Patient eager to begin process for obtaining a home unit and signed initial paperwork. Next session will re-visit patient's HEP for LE strength and neck ROM. If time allows, will use Bioness for strength training.     Rehab Potential  Good    PT Frequency  2x / week    PT Duration  4 weeks    PT Treatment/Interventions  ADLs/Self Care Home Management;Electrical Stimulation;Stair training;Gait training;Therapeutic activities;Therapeutic exercise;Balance training;Neuromuscular re-education;Patient/family education;Orthotic Fit/Training;Passive range of motion;Manual techniques    PT Next Visit Plan  re-assess HEP for leg strengthening and neck ROM--update as indicated; cont LLE strengthening - especially Lt plantarflexors, hip abductors and extensors (?in conjunction with  Bioness) ; continue gait training  with bioness lt cuff and rt thigh component to control genu recurvatum);    PT Home Exercise Plan  see pt instructions    Consulted and  Agree with Plan of Care  Patient       Patient will benefit from skilled therapeutic intervention in order to improve the following deficits and impairments:  Abnormal gait, Decreased balance, Decreased coordination, Decreased range of motion, Decreased strength, Impaired flexibility, Pain, Impaired UE functional use, Postural dysfunction  Visit Diagnosis: Muscle weakness (generalized)  Other abnormalities of gait and mobility  Other symptoms and signs involving the musculoskeletal system     Problem List Patient Active Problem List   Diagnosis Date Noted  . Weakness of left side of body 11/19/2016  . Stress due to illness of family member 06/10/2016  . Syncope 04/23/2016  . Onychomycosis 04/23/2016  . Cerumen impaction 04/23/2016  . Routine general medical examination at a health care facility 06/13/2014  . Constipation due to opioid therapy 06/13/2014  . Chronic pain syndrome 02/21/2014  . Allergic reaction 02/20/2014  . Neurofibromatosis, type 1 (HCC) 12/14/2013  . Preventative health care 06/23/2010  . [facility] SYNDROME 04/12/2009    Zena Amos, PT 12/25/2016, 7:35 AM  [facility] [address] [place], [place], [address] Phone: [phone]   Fax:  [phone]  Name: Larry Riley MRN: [id number] Date of Birth: [birth year]

## 2016-12-29 ENCOUNTER — Ambulatory Visit: Payer: 59 | Admitting: Physical Therapy

## 2016-12-29 ENCOUNTER — Encounter: Payer: Self-pay | Admitting: Physical Therapy

## 2016-12-29 ENCOUNTER — Ambulatory Visit: Payer: Self-pay | Admitting: Physical Therapy

## 2016-12-29 DIAGNOSIS — M6281 Muscle weakness (generalized): Secondary | ICD-10-CM | POA: Diagnosis not present

## 2016-12-29 DIAGNOSIS — R2689 Other abnormalities of gait and mobility: Secondary | ICD-10-CM

## 2016-12-29 DIAGNOSIS — R29898 Other symptoms and signs involving the musculoskeletal system: Secondary | ICD-10-CM

## 2016-12-29 NOTE — Patient Instructions (Addendum)
   When doing seated neck stretching, earlobe to your shoulder, try holding onto the bottom/side of a chair to anchor your shoulder.      Leg press at the gym (100#), and do toe presses (60#) and let heels go beyond neutral to stretch heelcord.      Any leg machine with back support. Only one to be cautious with is knee extension (bar across your shins). Be sure to tighten core and keep back neutral. Start with lower weights.

## 2016-12-29 NOTE — Therapy (Signed)
[facility] [address] [place], [place], [address] Phone: [phone]   Fax:  [phone]  Physical Therapy Treatment  Patient Details  Name: Larry Riley MRN: [id number] Date of Birth: September 13, 1980 Referring Provider: Dr. Mindi JunkerWesley Hsu   Encounter Date: 12/29/2016  PT End of Session - 12/29/16 1950    Visit Number  7    Number of Visits  9    Date for PT Re-Evaluation  12/31/16    Authorization Type  UHC    Authorization Time Period  Insurance calendar resets in July    Authorization - Visit Number  7    Authorization - Number of Visits  20    PT Start Time  1322    PT Stop Time  1402    PT Time Calculation (min)  40 min    Activity Tolerance  Patient tolerated treatment well    Behavior During Therapy  [facility] for tasks assessed/performed       Past Medical History:  Diagnosis Date  . MRSA (methicillin resistant Staphylococcus aureus) 2005   leg  . Neurofibromatosis    tumor down spine and brain    Past Surgical History:  Procedure Laterality Date  . hydrocephalus     [place] 1999[place]  . NECK SURGERY      tumor removal  . SPINE SURGERY  10/2013   c2-t2 fusion  , laminectomy c1-6-- Dr [facility]d [facility]-- [place]  . VASECTOMY  02/2013    There were no vitals filed for this visit.  Subjective Assessment - 12/29/16 1323    Subjective  May have overdone it with installing cabinets with a friend. Doing leg exercises every 2-3 days. Wants to add in some leg machines at the gym. Wants to make additional appointments to make up for missed appointments during POC. Wants to save "a few" visits for when Bioness unit arrives.     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  No/denies                       [facility]/Exercise - 12/29/16 0001      Exercises   Exercises  Neck      Neck Exercises: Seated   Cervical Rotation  Both 3 reps, 30 sec    Lateral Flexion  Both 3 reps 30 sec    Shoulder Rolls  Backwards;Forwards;10 reps      Neck Exercises: Supine   Neck Retraction  5 reps 30 sec; one pillow progressed to no pilow    Cervical Rotation  Right;Left      Lumbar Exercises: Quadruped   Straight Leg Raise  5 reps;5 seconds alternating bil       Knee/Hip Exercises: Machines for Strengthening   Total Gym Leg Press  60 # progressed to 100# for leg press x 15 reps; 60# for "heel raises" x 15 reps              PT Education - 12/29/16 1957    Education provided  Yes    Education Details  see pt instructions for HEP updates    Person(s) Educated  Patient    Methods  Explanation;Demonstration;Handout    Comprehension  Verbalized understanding;Returned demonstration;Need further instruction  PT Long Term Goals - 12/29/16 2001      PT LONG TERM GOAL #1   Title  Amb. 1000' on all surfaces with AFO on LLE modified independently.    Baseline  pt not wearing AFO - 12-01-16; not wearing 12/29/16    Time  4    Period  Weeks    Status  Unable to assess      PT LONG TERM GOAL #2   Title  Independent with HEP for LLE strengthening and cervical ROM/stretching    Baseline  12/29/16 required some cues/instruction in most recent update    Time  4    Period  Weeks    Status  On-going      PT LONG TERM GOAL #3   Title  Improve TUG score from 10.88 secs to </= 9 secs with use of AFO on LLE to achieve prior functional status with mobility.     Baseline  10.88 secs on 12-01-16;  12/29/16 unable to assess due to lack of time in session and lack of AFO    Time  4    Period  Weeks    Status  Unable to assess      PT LONG TERM GOAL #4   Title  Improve 5x sit to stand score from 14.10 secs to </= 11 secs without UE support from  standard chair.    Baseline  14.10 secs without UE support on standard chair; 12/18/18unable to assess due to lack of time in session and lack of AFO    Time  4    Period  Weeks    Status  On-going      PT LONG TERM GOAL #5   Title  Assess use and benefit of Bioness unit for LLE if pt is determined to be appropriate candidate for use of this modality.    Baseline  12/29/16 good candidate and beginning process for home unit    Time  4    Period  Weeks    Status  Achieved            Plan - 12/29/16 1958    Clinical Impression Statement  Session focused on progressing pt's HEP and educating on appropriate leg machines he can begin to do at the gym. Discussed which MD patient wants Bioness approval request sent to and pt provided physician's nurse's name and FAX number. Discussed 20 visit per year limit (year ends July) and pt verbalized understanding with plan to reserve some visits for Bioness training and in case of need again this year.     Rehab Potential  Good    PT Frequency  2x / week    PT Duration  4 weeks    PT Treatment/Interventions  ADLs/Self Care Home Management;Electrical Stimulation;Stair training;Gait training;Therapeutic activities;Therapeutic exercise;Balance training;Neuromuscular re-education;Patient/family education;Orthotic Fit/Training;Passive range of motion;Manual techniques    PT Next Visit Plan  check remaining LTGs with use of Bioness; discuss recert ?2x/wk x 2 wks then hold until bioness comes(will have 8 v left); ?schedule 1/31 with Marcelino Duster cont LLE strengthening (?in conjunction with Bioness)- especially Lt plantarflexors, hip abductors and extensors; continue gait training  with bioness lt cuff and rt thigh component to control genu recurvatum);    PT Home Exercise Plan  see pt instructions    Consulted and Agree with Plan of Care  Patient       Patient will benefit from skilled therapeutic intervention in order to improve the following deficits and  impairments:  Abnormal gait, Decreased balance, Decreased coordination, Decreased range of motion, Decreased strength, Impaired flexibility, Pain, Impaired UE functional use, Postural dysfunction  Visit Diagnosis: Muscle weakness (generalized)  Other abnormalities of gait and mobility  Other symptoms and signs involving the musculoskeletal system     Problem List Patient Active Problem List   Diagnosis Date Noted  . Weakness of left side of body 11/19/2016  . Stress due to illness of family member 06/10/2016  . Syncope 04/23/2016  . Onychomycosis 04/23/2016  . Cerumen impaction 04/23/2016  . Routine general medical examination at a health care facility 06/13/2014  . Constipation due to opioid therapy 06/13/2014  . Chronic pain syndrome 02/21/2014  . Allergic reaction 02/20/2014  . Neurofibromatosis, type 1 (HCC) 12/14/2013  . Preventative health care 06/23/2010  . [facility] 04/12/2009    Zena AmosLynn P Atif Chapple, PT 12/29/2016, 8:17 PM  [facility] [address] [place], [place], [address] Phone: [phone]   Fax:  [phone]  Name: Larry Riley MRN: [id number] Date of Birth: May 31, 1980

## 2016-12-31 ENCOUNTER — Encounter: Payer: Self-pay | Admitting: Physical Therapy

## 2016-12-31 ENCOUNTER — Ambulatory Visit: Payer: 59 | Admitting: Physical Therapy

## 2016-12-31 ENCOUNTER — Telehealth: Payer: Self-pay | Admitting: Physical Therapy

## 2016-12-31 DIAGNOSIS — M6281 Muscle weakness (generalized): Secondary | ICD-10-CM | POA: Diagnosis not present

## 2016-12-31 DIAGNOSIS — R2681 Unsteadiness on feet: Secondary | ICD-10-CM

## 2016-12-31 DIAGNOSIS — R2689 Other abnormalities of gait and mobility: Secondary | ICD-10-CM

## 2016-12-31 DIAGNOSIS — R29898 Other symptoms and signs involving the musculoskeletal system: Secondary | ICD-10-CM

## 2016-12-31 NOTE — Therapy (Signed)
[facility] [address] [place], [place], [address] Phone: [phone]   Fax:  [phone]  Physical Therapy Treatment  Patient Details  Name: Larry HammondJoshua Riley MRN: [id number] Date of Birth: 04-22-1980 Referring Provider: Dr. Mindi JunkerWesley Hsu   Encounter Date: 12/31/2016  PT End of Session - 12/31/16 2035    Visit Number  8    Number of Visits  9    Date for PT Re-Evaluation  12/31/16    Authorization Type  UHC    Authorization Time Period  Insurance calendar resets in July    Authorization - Visit Number  8    Authorization - Number of Visits  20    PT Start Time  0805    PT Stop Time  0845    PT Time Calculation (min)  40 min    Activity Tolerance  Patient tolerated treatment well    Behavior During Therapy  [facility] for tasks assessed/performed       Past Medical History:  Diagnosis Date  . MRSA (methicillin resistant Staphylococcus aureus) 2005   leg  . Neurofibromatosis    tumor down spine and brain    Past Surgical History:  Procedure Laterality Date  . hydrocephalus     [place]unt 1999[place]  . NECK SURGERY      tumor removal  . SPINE SURGERY  10/2013   c2-t2 fusion  , laminectomy c1-6-- Dr [facility]d [facility]-- [place]  . VASECTOMY  02/2013    There were no vitals filed for this visit.  Subjective Assessment - 12/31/16 0806    Subjective  I tried the quadruped exercises on my bed and it was too soft. The floor was too hard on my knees. Considering getting a yoga mat as he likes the exercise. Remembered to do his neck stretches a couple of times at his desk yesterday. He feels if he can remember to do them, it will really help.    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  Yes     Pain Score  4     Pain Location  Neck    Pain Orientation  Left;Right    Pain Descriptors / Indicators  Aching    Pain Type  Chronic pain    Pain Onset  More than a month ago    Pain Frequency  Constant                      [facility] Adult PT Treatment/Exercise - 12/31/16 2023      Ambulation/Gait   Ambulation/Gait  Yes    Ambulation/Gait Assistance  4: Min guard    Ambulation/Gait Assistance Details  close guarding due to using lower leg Bioness cuff only as pt forgot to wear shorts to allow application of thigh component    Ambulation Distance (Feet)  120 Feet 60, 60    Assistive device  None    Gait Pattern  Step-through pattern;Decreased dorsiflexion - left;Left genu recurvatum;Decreased step length - right;Decreased stance time - left;Left steppage;Poor foot clearance - left    Ambulation Surface  Indoor    Gait Comments  With only lower leg bioness e-stim, pt with poor ability to clear his left foot with patient catching left toe to the point of  tripping and LOB x 4      Knee/Hip Exercises: Standing   Rocker Board  2 minutes emphasizing ankle DF & balance in // bars    Other Standing Knee Exercises  standing, closed chain bil knee flexion x 10 reps      Knee/Hip Exercises: Supine   Bridges  Strengthening;Both;1 set;10 reps    Straight Leg Raises  Strengthening;Left;1 set;5 reps with ankle DF      Electrical Stimulation   Electrical Stimulation Location  left lower leg Bioness cuff    Electrical Stimulation Action  DF with eversion    Electrical Stimulation Parameters  steering electrode      Ankle Exercises: Seated   BAPS  Sitting;10 reps ankle DF                  PT Long Term Goals - 12/29/16 2001      PT LONG TERM GOAL #1   Title  Amb. 1000' on all surfaces with AFO on LLE modified independently.    Baseline  pt not wearing AFO - 12-01-16; not wearing 12/29/16    Time  4    Period  Weeks    Status  Unable to assess      PT LONG TERM  GOAL #2   Title  Independent with HEP for LLE strengthening and cervical ROM/stretching    Baseline  12/29/16 required some cues/instruction in most recent update    Time  4    Period  Weeks    Status  On-going      PT LONG TERM GOAL #3   Title  Improve TUG score from 10.88 secs to </= 9 secs with use of AFO on LLE to achieve prior functional status with mobility.     Baseline  10.88 secs on 12-01-16;  12/29/16 unable to assess due to lack of time in session and lack of AFO    Time  4    Period  Weeks    Status  Unable to assess      PT LONG TERM GOAL #4   Title  Improve 5x sit to stand score from 14.10 secs to </= 11 secs without UE support from standard chair.    Baseline  14.10 secs without UE support on standard chair; 12/18/18unable to assess due to lack of time in session and lack of AFO    Time  4    Period  Weeks    Status  On-going      PT LONG TERM GOAL #5   Title  Assess use and benefit of Bioness unit for LLE if pt is determined to be appropriate candidate for use of this modality.    Baseline  12/29/16 good candidate and beginning process for home unit    Time  4    Period  Weeks    Status  Achieved            Plan - 12/31/16 2042    Clinical Impression Statement  Session focused on educating patient on how to use Bioness (in training mode) for ther-ex with left foot in both open and closed-chain exercises. Observed patient's gait with only the lower cuff (could not don thigh cuff due to pt forgetting his shorts). Without stimulation of hamstrings during gait cycle, pt moderately unsteady with catching left foot a minimum of 4 times with loss of balance. Patient with significantly safer walking with use of lower leg cuff and thigh piece.     Rehab  Potential  Good    PT Frequency  2x / week    PT Duration  4 weeks    PT Treatment/Interventions  ADLs/Self Care Home Management;Electrical Stimulation;Stair training;Gait training;Therapeutic activities;Therapeutic  exercise;Balance training;Neuromuscular re-education;Patient/family education;Orthotic Fit/Training;Passive range of motion;Manual techniques    PT Next Visit Plan  check remaining LTGs (?with use of Bioness);   cont LLE strengthening (?in conjunction with Bioness)- especially Lt plantarflexors, hip abductors and extensors; continue gait training  with bioness lt cuff and rt thigh component to control genu recurvatum);    PT Home Exercise Plan  see pt instructions    Consulted and Agree with Plan of Care  Patient       Patient will benefit from skilled therapeutic intervention in order to improve the following deficits and impairments:  Abnormal gait, Decreased balance, Decreased coordination, Decreased range of motion, Decreased strength, Impaired flexibility, Pain, Impaired UE functional use, Postural dysfunction  Visit Diagnosis: Muscle weakness (generalized)  Other abnormalities of gait and mobility  Unsteadiness on feet  Other symptoms and signs involving the musculoskeletal system     Problem List Patient Active Problem List   Diagnosis Date Noted  . Weakness of left side of body 11/19/2016  . Stress due to illness of family member 06/10/2016  . Syncope 04/23/2016  . Onychomycosis 04/23/2016  . Cerumen impaction 04/23/2016  . Routine general medical examination at a health care facility 06/13/2014  . Constipation due to opioid therapy 06/13/2014  . Chronic pain syndrome 02/21/2014  . Allergic reaction 02/20/2014  . Neurofibromatosis, type 1 (HCC) 12/14/2013  . Preventative health care 06/23/2010  . [facility] 04/[facility]    Zena AmosLynn P Brodin Gelpi, PT 12/31/2016, 8:54 PM  [facility] [address] [place], [place], [address] Phone: [phone]   Fax:  [phone]  Name: Larry HammondJoshua Riley MRN: [id number] Date of Birth: April 23, 1980

## 2016-12-31 NOTE — Telephone Encounter (Addendum)
Dr. Raynald KempHsu,  Larry HammondJoshua Riley would benefit from [facility] functional [facility] stimulation use on left lower extremity due to diagnosis of neurofibromatosis, C1-2 tumor removal, and left leg weakness including foot drop. His primary functional limitation is decreased safety in ambulation due to inability to clear his left foot combined with knee instability/hyperextension.   Please fill out the required areas on the [employer] neuromuscular stimulator statement of medical necessity (primary diagnosis, physician license #, NPI #, and physician signature). This form is to be faxed to [employer] at [phone]. Please let me know if you have any questions.   Thank you,   Veda CanningLynn Nycholas Rayner, PT Outpatient Neurorehabilitation [address], [address] [place], [place] [address] [phone]

## 2017-01-04 ENCOUNTER — Ambulatory Visit: Payer: 59 | Admitting: Physical Therapy

## 2017-01-04 ENCOUNTER — Encounter: Payer: Self-pay | Admitting: Physical Therapy

## 2017-01-04 DIAGNOSIS — R29898 Other symptoms and signs involving the musculoskeletal system: Secondary | ICD-10-CM

## 2017-01-04 DIAGNOSIS — R2689 Other abnormalities of gait and mobility: Secondary | ICD-10-CM

## 2017-01-04 DIAGNOSIS — M6281 Muscle weakness (generalized): Secondary | ICD-10-CM

## 2017-01-04 NOTE — Therapy (Signed)
[facility] [address] [place], [place], [address] Phone: [phone]   Fax:  [phone]  Physical Therapy Treatment  Patient Details  Name: Larry Riley MRN: [id number] Date of Birth: Feb 03, 1980 Referring Provider: Dr. Gaspar Cola   Encounter Date: 01/04/2017  PT End of Session - 01/04/17 0907    Visit Number  9    Number of Visits  16    Date for PT Re-Evaluation  03/05/17    Authorization Type  UHC    Authorization Time Period  Insurance calendar resets in July    Authorization - Visit Number  9    Authorization - Number of Visits  20    PT Start Time  0802    PT Stop Time  0845    PT Time Calculation (min)  43 min    Activity Tolerance  Patient tolerated treatment well    Behavior During Therapy  [facility] for tasks assessed/performed       Past Medical History:  Diagnosis Date  . MRSA (methicillin resistant Staphylococcus aureus) 2005   leg  . Neurofibromatosis    tumor down spine and brain    Past Surgical History:  Procedure Laterality Date  . hydrocephalus     shunt 1999[place]  . NECK SURGERY      tumor removal  . SPINE SURGERY  10/2013   c2-t2 fusion  , laminectomy c1-6-- Dr Rigoberto Noel-- [facility]  . VASECTOMY  02/2013    There were no vitals filed for this visit.  Subjective Assessment - 01/04/17 0856    Subjective  Reports with holiday preparations yesterday, he forgot to do his stretches and he feels much more stiff in his neck and back today.     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  Yes    Pain Score  4     Pain Location  Neck    Pain Orientation  Right;Left    Pain Descriptors / Indicators  Aching    Pain Type  Chronic pain    Pain Onset   More than a month ago    Pain Frequency  Constant                      [facility] Adult PT Treatment/Exercise - 01/04/17 0858      Transfers   Transfers  Sit to Stand    Five time sit to stand comments   9.22    Number of Reps  10 reps    Comments  did not use bioness during test      Ambulation/Gait   Ambulation/Gait  Yes    Ambulation/Gait Assistance  5: Supervision;6: Modified independent (Device/Increase time)    Ambulation/Gait Assistance Details  pt wore shorts and able to use both leg cuff and thigh cuff for bioness with much improved stability with walking (no episodes of catching his foot today)    Ambulation Distance (Feet)  600 Feet +200, +200 (small breaks to adjust bioness pad placement    Assistive device  None    Gait Pattern  Step-through pattern;Decreased step length - right;Decreased dorsiflexion - left;Left steppage with fatigue (~500 ft, began lt genu recurvatum)    Ambulation Surface  Indoor  Standardized Balance Assessment   Standardized Balance Assessment  Timed Up and Go Test      Timed Up and Go Test   TUG  Normal TUG    Normal TUG (seconds)  8.1      Self-Care   Self-Care  Other Self-Care Comments    Other Self-Care Comments   Educated pt on set-up and break down of bioness leg cuff and thigh piece. Pt with difficulty putting thigh piece high enough on his own (due to tightness of his shorts)      Knee/Hip Exercises: Prone   Hamstring Curl  2 sets;10 reps with bioness, including ankle DF      Electrical Stimulation   Electrical Stimulation Location  left lower leg Bioness cuff; Lt thigh piece for hamstrings    Electrical Stimulation Parameters  adjustments made to timing of firing of e-stim while walking to maximize DF for left foot clearance and reduce foot slap while also controlling Lt knee genu recurvatum; utilizing Bioness controller #2             PT Education - 01/04/17 0906    Education provided  Yes    Education Details   set-up, application, and removing both Bioness pieces    Person(s) Educated  Patient    Methods  Explanation;Demonstration;Verbal cues    Comprehension  Verbalized understanding;Returned demonstration;Need further instruction       PT Short Term Goals - 01/04/17 0925      PT SHORT TERM GOAL #1   Title  Patient will be independent with updated HEP (updated to progress the challenge). (Target for all STGs 02/03/17)    Time  4    Period  Weeks    Status  New    Target Date  02/03/17      PT SHORT TERM GOAL #2   Title  Patient's gait velocity with Bioness will increase to >=3.5 ft/sec (approaching age-based norm of 4.17 ft/sec)    Time  4    Period  Weeks    Status  New        PT Long Term Goals - 01/04/17 0910      PT LONG TERM GOAL #1   Title  Amb. 1000' on all surfaces with AFO on LLE modified independently.    Baseline  pt not wearing AFO - 12-01-16; not wearing 12/29/16; 01/04/17 not wearing his AFO, utilized [employer] and combined total 1040f (breaks for adjusting Bioness)    Time  4    Period  Weeks    Status  Partially Met      PT LONG TERM GOAL #2   Title  Independent with HEP for LLE strengthening and cervical ROM/stretching    Baseline  12/29/16 required some cues/instruction in most recent update    Time  4    Period  Weeks    Status  Achieved      PT LONG TERM GOAL #3   Title  Improve TUG score from 10.88 secs to </= 9 secs with use of AFO on LLE to achieve prior functional status with mobility.     Baseline  10.88 secs on 12-01-16;  01/04/17 with Bioness 8.1 sec    Time  4    Period  Weeks    Status  Achieved      PT LONG TERM GOAL #4   Title  Improve 5x sit to stand score from 14.10 secs to </= 11 secs without UE support from standard chair.  Baseline  14.10 secs without UE support on standard chair; 01/04/17 9.22 sec (no UE assist)    Time  4    Period  Weeks    Status  Achieved      PT LONG TERM GOAL #5   Title  Assess use and benefit of Bioness  unit for LLE if pt is determined to be appropriate candidate for use of this modality.    Baseline  12/29/16 good candidate and beginning process for home unit    Time  4    Period  Weeks    Status  Achieved      Additional Long Term Goals   Additional Long Term Goals  Yes      PT LONG TERM GOAL #6   Title  Pt will be independent with updated HEP that incorporates use of Bioness for maximal strengthening/training of LLE (assuming pt qualifies for/obtains Bioness) (Target Date for new LTGs 03/05/17)    Status  New      PT LONG TERM GOAL #7   Title  Patient will be independent donning, doffing, and utilizing Bioness leg cuff, thigh piece, and patient controller.     Status  New      PT LONG TERM GOAL #8   Title  Patient will be able to verbalize a plan to return to community-based exercise program to maintain the gains made during PT.     Status  New            Plan - 01/04/17 0909    Clinical Impression Statement  Session focused on assessing LTGs, education re: donning and doffing Bioness, and gait training/ther-ex with Bioness activating Lt ankle DF and hamstrings. Patient met 4 of 5 goals and partially met the 5th goal. He has made excellent progress and can continue to benefit from PT to address his LLE weakness (via functional e-stim) and safety with gait. In addition, his paperwork for obtaining [employer] for personal use has been completed and waiting to hear re: approval.     Rehab Potential  Good    PT Frequency  1x / week    PT Duration  8 weeks    PT Treatment/Interventions  ADLs/Self Care Home Management;Electrical Stimulation;Stair training;Gait training;Therapeutic activities;Therapeutic exercise;Balance training;Neuromuscular re-education;Patient/family education;Orthotic Fit/Training;Passive range of motion;Manual techniques    PT Next Visit Plan  cont lt ankle DF and hamstring strengthening in conjunction with Bioness; also strengthen Lt plantarflexors, hip abductors  and extensors; continue gait training  with bioness lt cuff and rt thigh component to control genu recurvatum);    PT Home Exercise Plan  see pt instructions    Consulted and Agree with Plan of Care  Patient       Patient will benefit from skilled therapeutic intervention in order to improve the following deficits and impairments:  Abnormal gait, Decreased balance, Decreased coordination, Decreased range of motion, Decreased strength, Impaired flexibility, Pain, Impaired UE functional use, Postural dysfunction  Visit Diagnosis: Muscle weakness (generalized) - Plan: PT plan of care cert/re-cert  Other abnormalities of gait and mobility - Plan: PT plan of care cert/re-cert  Other symptoms and signs involving the musculoskeletal system - Plan: PT plan of care cert/re-cert     Problem List Patient Active Problem List   Diagnosis Date Noted  . Weakness of left side of body 11/19/2016  . Stress due to illness of family member 06/10/2016  . Syncope 04/23/2016  . Onychomycosis 04/23/2016  . Cerumen impaction 04/23/2016  . Routine general medical  examination at a health care facility 06/13/2014  . Constipation due to opioid therapy 06/13/2014  . Chronic pain syndrome 02/21/2014  . Allergic reaction 02/20/2014  . Neurofibromatosis, type 1 ([place]) 12/14/2013  . Preventative health care 06/23/2010  . [facility] SYNDROME 04/12/2009    Rexanne Mano, PT 01/04/2017, 9:39 AM  [facility] [address] [place], [place], [address] Phone: [phone]   Fax:  [phone]  Name: Jamerion Cabello MRN: [id number] Date of Birth: [birth year]

## 2017-01-06 ENCOUNTER — Ambulatory Visit: Payer: 59 | Admitting: Physical Therapy

## 2017-01-14 ENCOUNTER — Ambulatory Visit: Payer: 59 | Attending: Nurse Practitioner | Admitting: Physical Therapy

## 2017-01-14 ENCOUNTER — Encounter: Payer: Self-pay | Admitting: Physical Therapy

## 2017-01-14 DIAGNOSIS — M6281 Muscle weakness (generalized): Secondary | ICD-10-CM | POA: Insufficient documentation

## 2017-01-14 DIAGNOSIS — R2689 Other abnormalities of gait and mobility: Secondary | ICD-10-CM

## 2017-01-14 DIAGNOSIS — R29898 Other symptoms and signs involving the musculoskeletal system: Secondary | ICD-10-CM | POA: Insufficient documentation

## 2017-01-14 NOTE — Therapy (Signed)
[facility] [address] [place], [place], [address] Phone: [phone]   Fax:  [phone]  Physical Therapy Treatment  Patient Details  Name: Larry Riley MRN: [id number] Date of Birth: November 30, 1980 Referring Provider: Dr. Gaspar Cola   Encounter Date: 01/14/2017  PT End of Session - 01/14/17 1330    Visit Number  10    Number of Visits  16    Date for PT Re-Evaluation  03/05/17    Authorization Type  UHC    Authorization Time Period  Insurance calendar resets in July    Authorization - Visit Number  10    Authorization - Number of Visits  20    PT Start Time  [phone] pt arrived late    PT Stop Time  0848    PT Time Calculation (min)  42 min    Activity Tolerance  Patient tolerated treatment well    Behavior During Therapy  [facility] for tasks assessed/performed       Past Medical History:  Diagnosis Date  . MRSA (methicillin resistant Staphylococcus aureus) 2005   leg  . Neurofibromatosis    tumor down spine and brain    Past Surgical History:  Procedure Laterality Date  . hydrocephalus     shunt 1999[place]  . NECK SURGERY      tumor removal  . SPINE SURGERY  10/2013   c2-t2 fusion  , laminectomy c1-6-- Dr Rigoberto Noel-- [facility]  . VASECTOMY  02/2013    There were no vitals filed for this visit.  Subjective Assessment - 01/14/17 1211    Subjective  Reports [employer] contacted him and he is to do phone call with them at 5 pm today. He's not sure what it is about. States he has not been exercising over the holidays like he should.     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  No/denies    Pain Onset  --                      [facility] Adult PT  Treatment/Exercise - 01/14/17 1212      Ambulation/Gait   Ambulation/Gait  Yes    Ambulation/Gait Assistance  5: Supervision    Ambulation/Gait Assistance Details  with Bioness L300 left leg cuff and thigh piece; Patient with degrading Lt ankle DF and knee control as he fatigued after training/strengthening LLE    Ambulation Distance (Feet)  240 Feet 200    Assistive device  None    Gait Pattern  Step-through pattern;Decreased step length - right;Decreased dorsiflexion - left;Left steppage with Bioness    Ambulation Surface  Indoor      Knee/Hip Exercises: Standing   Other Standing Knee Exercises  with Bioness to hamstrings; standing, left foot forward; green theraband under left foot and held in bil hands with elbows at 90; closed chain eccentric control of hip flexion with knee maintained in slight flexion and then return to upright stance via hip extension x 10 reps      Knee/Hip Exercises: Seated   Stool Scoot - Round Trips  40 ft laps x 3 with bioness LLE      Electrical Stimulation   Electrical Stimulation Location  left lower leg Bioness cuff; Lt thigh  piece for hamstrings    Electrical Stimulation Action  DF with eversion; eccentric hamstrings to control genu recurvatum    Electrical Stimulation Parameters  adjustments made to timing of firing of e-stim while walking to maximize DF for left foot clearance and reduce foot slap while also controlling Lt knee genu recurvatum; utilizing Bioness controller #2    Electrical Stimulation Goals  Neuromuscular facilitation;Strength see ther-ex for exercises with Bioness      Ankle Exercises: Seated   BAPS  Sitting;15 reps;Other (comment) yellow band for DF and eversion resistance; with Bioness             PT Education - 01/14/17 1221    Education provided  Yes    Education Details  pt turned on and synced Bioness leg cuff and thigh piece; pt applied Bioness with min cues and assist with positioning lower leg cuff more laterally for  improved DF with eversion; pt removed all pieces but did not have time to review shutting equipment off    Person(s) Educated  Patient       PT Short Term Goals - 01/04/17 0925      PT SHORT TERM GOAL #1   Title  Patient will be independent with updated HEP (updated to progress the challenge). (Target for all STGs 02/03/17)    Time  4    Period  Weeks    Status  New    Target Date  02/03/17      PT SHORT TERM GOAL #2   Title  Patient's gait velocity with Bioness will increase to >=3.5 ft/sec (approaching age-based norm of 4.17 ft/sec)    Time  4    Period  Weeks    Status  New        PT Long Term Goals - 01/04/17 0910      PT LONG TERM GOAL #1   Title  Amb. 1000' on all surfaces with AFO on LLE modified independently.    Baseline  pt not wearing AFO - 12-01-16; not wearing 12/29/16; 01/04/17 not wearing his AFO, utilized Bioness and combined total 1071f (breaks for adjusting Bioness)    Time  4    Period  Weeks    Status  Partially Met      PT LONG TERM GOAL #2   Title  Independent with HEP for LLE strengthening and cervical ROM/stretching    Baseline  12/29/16 required some cues/instruction in most recent update    Time  4    Period  Weeks    Status  Achieved      PT LONG TERM GOAL #3   Title  Improve TUG score from 10.88 secs to </= 9 secs with use of AFO on LLE to achieve prior functional status with mobility.     Baseline  10.88 secs on 12-01-16;  01/04/17 with Bioness 8.1 sec    Time  4    Period  Weeks    Status  Achieved      PT LONG TERM GOAL #4   Title  Improve 5x sit to stand score from 14.10 secs to </= 11 secs without UE support from standard chair.    Baseline  14.10 secs without UE support on standard chair; 01/04/17 9.22 sec (no UE assist)    Time  4    Period  Weeks    Status  Achieved      PT LONG TERM GOAL #5   Title  Assess use and benefit of Bioness unit  for LLE if pt is determined to be appropriate candidate for use of this modality.     Baseline  12/29/16 good candidate and beginning process for home unit    Time  4    Period  Weeks    Status  Achieved      Additional Long Term Goals   Additional Long Term Goals  Yes      PT LONG TERM GOAL #6   Title  Pt will be independent with updated HEP that incorporates use of Bioness for maximal strengthening/training of LLE (assuming pt qualifies for/obtains Bioness) (Target Date for new LTGs 03/05/17)    Status  New      PT LONG TERM GOAL #7   Title  Patient will be independent donning, doffing, and utilizing Bioness leg cuff, thigh piece, and patient controller.     Status  New      PT LONG TERM GOAL #8   Title  Patient will be able to verbalize a plan to return to community-based exercise program to maintain the gains made during PT.     Status  New            Plan - 01/14/17 1332    Clinical Impression Statement  Patient continues to benefit from PT and training with functional estim for ankle DF with eversion and hamstrings control of Lt knee. Patient ambulating further distances before nerve/muscle fatigues to point of requiring seated rest. Patient with excellent ability to coordinate timing of exercise with estim (using sound and vibration settings on for additional cues).     Rehab Potential  Good    PT Frequency  1x / week    PT Duration  8 weeks    PT Treatment/Interventions  ADLs/Self Care Home Management;Electrical Stimulation;Stair training;Gait training;Therapeutic activities;Therapeutic exercise;Balance training;Neuromuscular re-education;Patient/family education;Orthotic Fit/Training;Passive range of motion;Manual techniques    PT Next Visit Plan  update HEP for difficulty; cont lt ankle DF and hamstring strength with Bioness; also strengthen Lt plantarflexors, hip abductors and extensors;     PT Home Exercise Plan  see pt instructions    Consulted and Agree with Plan of Care  Patient       Patient will benefit from skilled therapeutic intervention in  order to improve the following deficits and impairments:  Abnormal gait, Decreased balance, Decreased coordination, Decreased range of motion, Decreased strength, Impaired flexibility, Pain, Impaired UE functional use, Postural dysfunction  Visit Diagnosis: Muscle weakness (generalized)  Other abnormalities of gait and mobility  Other symptoms and signs involving the musculoskeletal system     Problem List Patient Active Problem List   Diagnosis Date Noted  . Weakness of left side of body 11/19/2016  . Stress due to illness of family member 06/10/2016  . Syncope 04/23/2016  . Onychomycosis 04/23/2016  . Cerumen impaction 04/23/2016  . Routine general medical examination at a health care facility 06/13/2014  . Constipation due to opioid therapy 06/13/2014  . Chronic pain syndrome 02/21/2014  . Allergic reaction 02/20/2014  . Neurofibromatosis, type 1 ([place]) 12/14/2013  . Preventative health care 06/23/2010  . [facility] SYNDROME 04/12/2009    Rexanne Mano, PT 01/14/2017, 1:39 PM  [facility] [address] [place], [place], [address] Phone: [phone]   Fax:  [phone]  Name: Larry Riley MRN: [id number] Date of Birth: Apr 14, 1980

## 2017-01-18 ENCOUNTER — Ambulatory Visit: Payer: Self-pay | Admitting: Physical Therapy

## 2017-01-19 ENCOUNTER — Ambulatory Visit: Payer: Self-pay | Admitting: Physical Therapy

## 2017-01-21 ENCOUNTER — Encounter: Payer: Self-pay | Admitting: Internal Medicine

## 2017-01-21 ENCOUNTER — Ambulatory Visit: Payer: 59 | Admitting: Rehabilitative and Restorative Service Providers"

## 2017-01-21 ENCOUNTER — Encounter: Payer: Self-pay | Admitting: Rehabilitative and Restorative Service Providers"

## 2017-01-21 ENCOUNTER — Ambulatory Visit (INDEPENDENT_AMBULATORY_CARE_PROVIDER_SITE_OTHER): Payer: 59 | Admitting: Internal Medicine

## 2017-01-21 VITALS — BP 122/84 | HR 97 | Temp 98.0°F | Ht 68.0 in | Wt 195.0 lb

## 2017-01-21 DIAGNOSIS — Z114 Encounter for screening for human immunodeficiency virus [HIV]: Secondary | ICD-10-CM | POA: Diagnosis not present

## 2017-01-21 DIAGNOSIS — R2689 Other abnormalities of gait and mobility: Secondary | ICD-10-CM

## 2017-01-21 DIAGNOSIS — Z0000 Encounter for general adult medical examination without abnormal findings: Secondary | ICD-10-CM | POA: Diagnosis not present

## 2017-01-21 DIAGNOSIS — G894 Chronic pain syndrome: Secondary | ICD-10-CM

## 2017-01-21 DIAGNOSIS — M6281 Muscle weakness (generalized): Secondary | ICD-10-CM

## 2017-01-21 DIAGNOSIS — K219 Gastro-esophageal reflux disease without esophagitis: Secondary | ICD-10-CM

## 2017-01-21 DIAGNOSIS — R29898 Other symptoms and signs involving the musculoskeletal system: Secondary | ICD-10-CM

## 2017-01-21 DIAGNOSIS — Q8501 Neurofibromatosis, type 1: Secondary | ICD-10-CM

## 2017-01-21 HISTORY — DX: Gastro-esophageal reflux disease without esophagitis: K21.9

## 2017-01-21 MED ORDER — TRAMADOL HCL 50 MG PO TABS: 50.0000 mg | ORAL_TABLET | Freq: Three times a day (TID) | ORAL | 1 refills | Status: DC | PRN

## 2017-01-21 NOTE — Progress Notes (Signed)
Subjective:    Patient ID: Larry HammondJoshua Riley, male    DOB: [birth year], 37 y.o.   MRN: [id number]  HPI  Here for wellness and f/u;  Overall doing ok;  Pt denies Chest pain, worsening SOB, DOE, wheezing, orthopnea, PND, worsening LE edema, palpitations, dizziness or syncope.  Pt denies neurological change such as new headache, facial or extremity weakness.  Pt denies polydipsia, polyuria, or low sugar symptoms. Pt states overall good compliance with treatment and medications, good tolerability, and has been trying to follow appropriate diet.  Pt denies worsening depressive symptoms, suicidal ideation or panic. No fever, night sweats, wt loss, loss of appetite, or other constitutional symptoms.  Pt states good ability with ADL's, has low fall risk, home safety reviewed and adequate, no other significant changes in hearing or vision, and only occasionally active with exercise. For PT soon for chronic LBP and seeing pain management. No other complaints or interval hx Past Medical History:  Diagnosis Date  . GERD (gastroesophageal reflux disease) 01/21/2017  . MRSA (methicillin resistant Staphylococcus aureus) 2005   leg  . Neurofibromatosis    tumor down spine and brain   Past Surgical History:  Procedure Laterality Date  . hydrocephalus     [place]unt 1999[place]  . NECK SURGERY      tumor removal  . SPINE SURGERY  10/2013   c2-t2 fusion  , laminectomy c1-6-- [facility]-- [place]  . VASECTOMY  02/2013    reports that  has never smoked. he has never used smokeless tobacco. He reports that he drinks about 1.8 oz of alcohol per week. He reports that he does not use drugs. family history includes Arthritis in his father, paternal grandfather, and paternal grandmother; Diabetes in his mother and paternal grandfather; Healthy in his maternal grandfather and maternal grandmother; Hyperlipidemia in his paternal grandfather; Hypertension in his father and paternal grandfather; Prostate cancer in his father and  unknown relative. No Known Allergies Current Outpatient Medications on File Prior to Visit  Medication Sig Dispense Refill  . acetaminophen (TYLENOL) 325 MG tablet Take 650 mg by mouth every 6 (six) hours as needed for mild pain, moderate pain, fever or headache.     . bisacodyl (DULCOLAX) 5 MG EC tablet Take 5 mg by mouth daily as needed for mild constipation.     . cyclobenzaprine (FLEXERIL) 10 MG tablet TAKE ONE TABLET BY MOUTH THREE TIMES A DAY AS NEEDED FOR MUSCLE SPASMS 90 tablet 0  . diphenhydrAMINE (BENADRYL) 25 mg capsule Take 25 mg by mouth at bedtime as needed for allergies or sleep.    Marland Kitchen. EPINEPHrine (EPIPEN 2-PAK) 0.3 mg/0.3 mL IJ SOAJ injection Inject 0.3 mLs (0.3 mg total) into the muscle once. 1 Device 1  . guaiFENesin (MUCINEX) 600 MG 12 hr tablet Take 600 mg by mouth 2 (two) times daily as needed for cough or to loosen phlegm.    Marland Kitchen. ibuprofen (ADVIL,MOTRIN) 200 MG tablet Take 400 mg by mouth every 6 (six) hours as needed for headache, mild pain or moderate pain.    Marland Kitchen. omeprazole (PRILOSEC) 20 MG capsule Take 20 mg by mouth daily as needed (for acid reflux).    . pantoprazole (PROTONIX) 40 MG tablet Take 1 tablet (40 mg total) by mouth daily. 90 tablet 3  . sildenafil (REVATIO) 20 MG tablet Take 1-5 tablets by mouth daily as needed. 50 tablet 1  . terbinafine (LAMISIL) 250 MG tablet Please take one a day x 7days, repeat every 4 weeks x 4 months  28 tablet 0   No current facility-administered medications on file prior to visit.    Review of Systems Constitutional: Negative for other unusual diaphoresis, sweats, appetite or weight changes HENT: Negative for other worsening hearing loss, ear pain, facial swelling, mouth sores or neck stiffness.   Eyes: Negative for other worsening pain, redness or other visual disturbance.  Respiratory: Negative for other stridor or swelling Cardiovascular: Negative for other palpitations or other chest pain  Gastrointestinal: Negative for worsening  diarrhea or loose stools, blood in stool, distention or other pain Genitourinary: Negative for hematuria, flank pain or other change in urine volume.  Musculoskeletal: Negative for myalgias or other joint swelling.  Skin: Negative for other color change, or other wound or worsening drainage.  Neurological: Negative for other syncope or numbness. Hematological: Negative for other adenopathy or swelling Psychiatric/Behavioral: Negative for hallucinations, other worsening agitation, SI, self-injury, or new decreased concentration All other system neg per pt    Objective:   Physical Exam BP 122/84   Pulse 97   Temp 98 F (36.7 C) (Oral)   Ht 5\' 8"  (1.727 m)   Wt 195 lb (88.5 kg)   SpO2 99%   BMI 29.65 kg/m  VS noted,  Constitutional: Pt is oriented to person, place, and time. Appears well-developed and well-nourished, in no significant distress and comfortable Head: Normocephalic and atraumatic  Eyes: Conjunctivae and EOM are normal. Pupils are equal, round, and reactive to light Right Ear: External ear normal without discharge Left Ear: External ear normal without discharge Nose: Nose without discharge or deformity Mouth/Throat: Oropharynx is without other ulcerations and moist  Neck: Normal range of motion. Neck supple. No JVD present. No tracheal deviation present or significant neck LA or mass Cardiovascular: Normal rate, regular rhythm, normal heart sounds and intact distal pulses.   Pulmonary/Chest: WOB normal and breath sounds without rales or wheezing  Abdominal: Soft. Bowel sounds are normal. NT. No HSM  Musculoskeletal: Normal range of motion. Exhibits no edema Lymphadenopathy: Has no other cervical adenopathy.  Neurological: Pt is alert and oriented to person, place, and time. Pt has normal reflexes. No cranial nerve deficit. Motor grossly intact, Gait intact Skin: Skin is warm and dry. No rash noted or new ulcerations Psychiatric:  Has normal mood and affect. Behavior is  normal without agitation No other exam findings    Assessment & Plan:

## 2017-01-21 NOTE — Patient Instructions (Addendum)
Please continue all other medications as before, and refills have been done if requested - tramadol  Please have the pharmacy call with any other refills you may need.  Please continue your efforts at being more active, low cholesterol diet, and weight control.  You are otherwise up to date with prevention measures today.  Please keep your appointments with your specialists as you may have planned  Please go to the LAB in the Basement (turn left off the elevator) for the tests to be done today  You will be contacted by phone if any changes need to be made immediately.  Otherwise, you will receive a letter about your results with an explanation, but please check with MyChart first.  Please remember to sign up for MyChart if you have not done so, as this will be important to you in the future with finding out test results, communicating by private email, and scheduling acute appointments online when needed.  Please return in 1 year for your yearly visit, or sooner if needed, with Lab testing done 3-5 days before   OK to cancel the June 2019 appt

## 2017-01-21 NOTE — Therapy (Signed)
[facility] [address] [place], [place], [address] Phone: [phone]   Fax:  [phone]  Physical Therapy Treatment  Patient Details  Name: Larry Riley MRN: [id number] Date of Birth: 20-Dec-1980 Referring Provider: Dr. Gaspar Cola   Encounter Date: 01/21/2017  PT End of Session - 01/21/17 1349    Visit Number  11    Number of Visits  16    Date for PT Re-Evaluation  03/05/17    Authorization Type  [employer]    Authorization Time Period  Insurance calendar resets in July    Authorization - Visit Number  11    Authorization - Number of Visits  20    PT Start Time  [phone]    PT Stop Time  0934    PT Time Calculation (min)  47 min    Activity Tolerance  Patient tolerated treatment well    Behavior During Therapy  [facility] for tasks assessed/performed       Past Medical History:  Diagnosis Date  . MRSA (methicillin resistant Staphylococcus aureus) 2005   leg  . Neurofibromatosis    tumor down spine and brain    Past Surgical History:  Procedure Laterality Date  . hydrocephalus     [place]unt 1999[place]  . NECK SURGERY      tumor removal  . SPINE SURGERY  10/2013   c2-t2 fusion  , laminectomy c1-6-- Dr Rigoberto Noel-- [place]  . VASECTOMY  02/2013    There were no vitals filed for this visit.  Subjective Assessment - 01/21/17 0851    Subjective  The patient has gone to the gym 2 times in the past week.  He does HEP intermittently.      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  Yes    Pain Score  5     Pain Location  Neck    Pain Orientation  Right;Left    Pain Descriptors / Indicators  Aching    Pain Type  Chronic pain    Pain Onset  More than a month ago    Pain Frequency   Constant    Aggravating Factors   low back hurts at end of day    Pain Relieving Factors  pain medications, stretching          [facility] PT Assessment - 01/21/17 0853      Ambulation/Gait   Ambulation/Gait Assistance  6: Modified independent (Device/Increase time);5: Supervision    Ambulation/Gait Assistance Details  With Bioness L 300 on L LE for lower cuff for ankle dorsiflexion and thigh cuff for left hamstring activation.      Ambulation Distance (Feet)  300 Feet    Assistive device  None    Ambulation Surface  Indoor;Level    Gait Comments  Without Bioness, observed gait with L foot steppage pattern and foot drop.  With Bioness, the patient has improved foot clearance and mechanics of gait.  Ambulated >300 ft with bioness donned.                    [facility] Adult PT Treatment/Exercise - 01/21/17 0853      Ambulation/Gait   Ambulation/Gait  Yes      Self-Care   Self-Care  Other Self-Care Comments  Other Self-Care Comments   Discussed shoewear as patient wearing [employer] with visible bilateral pronation.  Would recommend a stability shoe to maintain ankle neutral.  Discussed possibility of new balane with medial support.        Neuro Re-ed    Neuro Re-ed Details   QUADRIPED spinal flexion/extension with tactile cues for neck positioning.  L LE hip extension with tactile cues and verbal cues for core engagement x 6 reps.   Then progressed to L LE extension + L knee flexion for hamstring activation.  *Patient needed assist to complete and maintain core.  R LE hip extension x 5 reps with tactile and verbal cues for core activation. Quadriped moving to tall kneeling for posture/core activation.  TALL KNEELING to 1/2 kneeling with min A L and R LEs with holding position and tactile cues for hip activation. PHYSIOBALL sitting performing L cervical sidebending isometric holds while activating core.  Performed seated marching and LE extension x 5 reps each with emphasis on core and  balance with CGA for safety.  Also performed physioball ant/posterior rolling with min A.         Exercises   Exercises  Other Exercises    Other Exercises   BIONESS DONNED:  performed standing L knee flexion with electrical stimulation x 8 reps with 4 second holds.  Performed stool scoots without e-stim for hamstring and ankle DF activation.  Patient performed x 115 ft with one rest break due to fatigue.        Modalities   Modalities  Electrical engineer Stimulation Location  left lower leg Bioness cuff; Lt thigh piece for hamstrings    Electrical Stimulation Action  DF with eversion; hamstring for genu recurvatum     Electrical Stimulation Parameters  *used prior established parameters in bioness training mode    Electrical Stimulation Goals  Neuromuscular facilitation;Strength               PT Short Term Goals - 01/04/17 0925      PT SHORT TERM GOAL #1   Title  Patient will be independent with updated HEP (updated to progress the challenge). (Target for all STGs 02/03/17)    Time  4    Period  Weeks    Status  New    Target Date  02/03/17      PT SHORT TERM GOAL #2   Title  Patient's gait velocity with Bioness will increase to >=3.5 ft/sec (approaching age-based norm of 4.17 ft/sec)    Time  4    Period  Weeks    Status  New        PT Long Term Goals - 01/04/17 0910      PT LONG TERM GOAL #1   Title  Amb. 1000' on all surfaces with AFO on LLE modified independently.    Baseline  pt not wearing AFO - 12-01-16; not wearing 12/29/16; 01/04/17 not wearing his AFO, utilized Bioness and combined total 1081f (breaks for adjusting Bioness)    Time  4    Period  Weeks    Status  Partially Met      PT LONG TERM GOAL #2   Title  Independent with HEP for LLE strengthening and cervical ROM/stretching    Baseline  12/29/16 required some cues/instruction in most recent update    Time  4    Period  Weeks    Status  Achieved  PT LONG TERM GOAL #3   Title  Improve TUG score from 10.88 secs to </= 9 secs with use of AFO on LLE to achieve prior functional status with mobility.     Baseline  10.88 secs on 12-01-16;  01/04/17 with Bioness 8.1 sec    Time  4    Period  Weeks    Status  Achieved      PT LONG TERM GOAL #4   Title  Improve 5x sit to stand score from 14.10 secs to </= 11 secs without UE support from standard chair.    Baseline  14.10 secs without UE support on standard chair; 01/04/17 9.22 sec (no UE assist)    Time  4    Period  Weeks    Status  Achieved      PT LONG TERM GOAL #5   Title  Assess use and benefit of Bioness unit for LLE if pt is determined to be appropriate candidate for use of this modality.    Baseline  12/29/16 good candidate and beginning process for home unit    Time  4    Period  Weeks    Status  Achieved      Additional Long Term Goals   Additional Long Term Goals  Yes      PT LONG TERM GOAL #6   Title  Pt will be independent with updated HEP that incorporates use of Bioness for maximal strengthening/training of LLE (assuming pt qualifies for/obtains Bioness) (Target Date for new LTGs 03/05/17)    Status  New      PT LONG TERM GOAL #7   Title  Patient will be independent donning, doffing, and utilizing Bioness leg cuff, thigh piece, and patient controller.     Status  New      PT LONG TERM GOAL #8   Title  Patient will be able to verbalize a plan to return to community-based exercise program to maintain the gains made during PT.     Status  New            Plan - 01/21/17 1358    Clinical Impression Statement  The patient has improved gait mechanics with use of functional e-stim device.  He fatigues with core activiation activities and continues with LE weakness.  PT to continue to progress functional training and emphasize transition to home/community activities for long term wellness.     PT Treatment/Interventions  ADLs/Self Care Home Management;Electrical  Stimulation;Stair training;Gait training;Therapeutic activities;Therapeutic exercise;Balance training;Neuromuscular re-education;Patient/family education;Orthotic Fit/Training;Passive range of motion;Manual techniques    PT Next Visit Plan  UPDATE HEP; L ankle DF/PF strengthening, HS strengthening with Bioness; hip strengthening (abductors/extensors); core stability.  Continue to make recommendations on shoewear?  Availability of pool for swimming for post d/c plan?    Consulted and Agree with Plan of Care  Patient       Patient will benefit from skilled therapeutic intervention in order to improve the following deficits and impairments:  Abnormal gait, Decreased balance, Decreased coordination, Decreased range of motion, Decreased strength, Impaired flexibility, Pain, Impaired UE functional use, Postural dysfunction  Visit Diagnosis: Muscle weakness (generalized)  Other abnormalities of gait and mobility  Other symptoms and signs involving the musculoskeletal system     Problem List Patient Active Problem List   Diagnosis Date Noted  . Weakness of left side of body 11/19/2016  . Stress due to illness of family member 06/10/2016  . Syncope 04/23/2016  . Onychomycosis 04/23/2016  . Cerumen  impaction 04/23/2016  . Routine general medical examination at a health care facility 06/13/2014  . Constipation due to opioid therapy 06/13/2014  . Chronic pain syndrome 02/21/2014  . Allergic reaction 02/20/2014  . Neurofibromatosis, type 1 ([place]) 12/14/2013  . Preventative health care 06/23/2010  . HORNER'S SYNDROME 04/12/2009    Candie Gintz, PT 01/21/2017, 2:01 PM  [facility] [address] [place], [place], [address] Phone: [phone]   Fax:  [phone]  Name: Larry Riley MRN: [id number] Date of Birth: 1980/02/29

## 2017-01-23 ENCOUNTER — Encounter: Payer: Self-pay | Admitting: Internal Medicine

## 2017-01-23 NOTE — Assessment & Plan Note (Signed)
For pain control as above

## 2017-01-23 NOTE — Assessment & Plan Note (Signed)
Stable, cont tramadol prn until able to establish with pain management

## 2017-01-23 NOTE — Assessment & Plan Note (Signed)

## 2017-01-26 ENCOUNTER — Ambulatory Visit: Payer: Self-pay | Admitting: Physical Therapy

## 2017-01-28 ENCOUNTER — Ambulatory Visit: Payer: 59 | Admitting: Physical Therapy

## 2017-01-28 ENCOUNTER — Encounter: Payer: Self-pay | Admitting: Physical Therapy

## 2017-01-28 DIAGNOSIS — R2689 Other abnormalities of gait and mobility: Secondary | ICD-10-CM

## 2017-01-28 DIAGNOSIS — M6281 Muscle weakness (generalized): Secondary | ICD-10-CM

## 2017-01-28 NOTE — Therapy (Signed)
[facility] [address] [place], [place], [address] Phone: [phone]   Fax:  [phone]  Physical Therapy Treatment  Patient Details  Name: Larry Riley MRN: [id number] Date of Birth: 1980/08/04 Referring Provider: Dr. Gaspar Cola   Encounter Date: 01/28/2017  PT End of Session - 01/28/17 1049    Visit Number  12    Number of Visits  16    Date for PT Re-Evaluation  03/05/17    Authorization Type  UHC    Authorization Time Period  Insurance calendar resets in July    Authorization - Visit Number  12    Authorization - Number of Visits  20    PT Start Time  [phone] late arrival and used restroom    PT Stop Time  0844    PT Time Calculation (min)  38 min    Equipment Utilized During Treatment  Other (comment) Bioness    Activity Tolerance  Patient tolerated treatment well    Behavior During Therapy  [facility] for tasks assessed/performed       Past Medical History:  Diagnosis Date  . GERD (gastroesophageal reflux disease) 01/21/2017  . MRSA (methicillin resistant Staphylococcus aureus) 2005   leg  . Neurofibromatosis    tumor down spine and brain    Past Surgical History:  Procedure Laterality Date  . hydrocephalus     [place]  . NECK SURGERY      tumor removal  . SPINE SURGERY  10/2013   c2-t2 fusion  , laminectomy c1-6-- Dr Rigoberto Noel-- [place]  . VASECTOMY  02/2013    There were no vitals filed for this visit.  Subjective Assessment - 01/28/17 0806    Subjective  Reports rough work week with lots of extra hours on the job. Has not been to the gym or done his HEP since he was last here.     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  No/denies                           [facility] Adult PT Treatment/Exercise - 01/28/17 1020      Ambulation/Gait   Ambulation/Gait  Yes    Ambulation/Gait Assistance  6: Modified independent (Device/Increase time);5: Supervision    Ambulation/Gait Assistance Details  With bioness as below    Ambulation Distance (Feet)  380 Feet 100    Assistive device  None    Gait Pattern  Step-through pattern;Decreased step length - right;Decreased dorsiflexion - left;Left steppage;Left genu recurvatum    Ambulation Surface  Indoor    Gait Comments  With Bioness able to safely walk 380 ft before DF (more than hamstrings) became too fatigued and required rest. Total # steps during session 254;      Self-Care   Self-Care  Other Self-Care Comments    Other Self-Care Comments   Provided patient with information re: [employer] store and expertise in finding right shoe based on gait dynamics. They also have Superfeet insoles that can assist with over-pronation if he does not want to buy new shoes (or to use in his shoes he already owns).       Neuro Re-ed    Neuro Re-ed Details   --  Exercises   Exercises  --    Other Exercises   --      Lumbar Exercises: Quadruped   Opposite Arm/Leg Raise  Right arm/Left leg;Left arm/Right leg;10 reps;5 seconds alternating; Bioness to LLE for hip ext, ankle DF      Knee/Hip Exercises: Seated   Stool Scoot - Round Trips  60 ft x 2, no bioness first 60 ft, assisted DF on 2nd 60 ft      Knee/Hip Exercises: Supine   Bridges  Strengthening;Both;1 set;15 reps with Bioness assist; hold 5 sec, rest 5 sec      Modalities   Modalities  Electrical Stimulation      Electrical Stimulation   Electrical Stimulation Location  left lower leg Bioness cuff; Lt thigh piece for hamstrings    Electrical Stimulation Action  DF with eversion; hamstring for genu recurvatum     Electrical Stimulation Parameters  hamstrings symmetric waveform, _0 , phase duration 300; DF symmetric waveform,  pulse rate _1 , phase duration 300    Electrical Stimulation Goals  Neuromuscular facilitation;Strength             PT Education - 01/28/17 1047    Education provided  Yes    Education Details  pt applied Bioness leg cuff and thigh piece required no cues for sequencing; noted error with leg cuff and assisted pt to problem solve and found one area of electrode was not in place; pt reapplied with good connection; pt removed but did not have pt power down    Person(s) Educated  Patient    Methods  Explanation;Demonstration    Comprehension  Verbalized understanding;Returned demonstration       PT Short Term Goals - 01/04/17 0925      PT SHORT TERM GOAL #1   Title  Patient will be independent with updated HEP (updated to progress the challenge). (Target for all STGs 02/03/17)    Time  4    Period  Weeks    Status  New    Target Date  02/03/17      PT SHORT TERM GOAL #2   Title  Patient's gait velocity with Bioness will increase to >=3.5 ft/sec (approaching age-based norm of 4.17 ft/sec)    Time  4    Period  Weeks    Status  New        PT Long Term Goals - 01/04/17 0910      PT LONG TERM GOAL #1   Title  Amb. 1000' on all surfaces with AFO on LLE modified independently.    Baseline  pt not wearing AFO - 12-01-16; not wearing 12/29/16; 01/04/17 not wearing his AFO, utilized Bioness and combined total 1058f (breaks for adjusting Bioness)    Time  4    Period  Weeks    Status  Partially Met      PT LONG TERM GOAL #2   Title  Independent with HEP for LLE strengthening and cervical ROM/stretching    Baseline  12/29/16 required some cues/instruction in most recent update    Time  4    Period  Weeks    Status  Achieved      PT LONG TERM GOAL #3   Title  Improve TUG score from 10.88 secs to </= 9 secs with use of AFO on LLE to achieve prior functional status with mobility.     Baseline  10.88 secs on 12-01-16;  01/04/17 with Bioness 8.1 sec    Time  4  Period  Weeks     Status  Achieved      PT LONG TERM GOAL #4   Title  Improve 5x sit to stand score from 14.10 secs to </= 11 secs without UE support from standard chair.    Baseline  14.10 secs without UE support on standard chair; 01/04/17 9.22 sec (no UE assist)    Time  4    Period  Weeks    Status  Achieved      PT LONG TERM GOAL #5   Title  Assess use and benefit of Bioness unit for LLE if pt is determined to be appropriate candidate for use of this modality.    Baseline  12/29/16 good candidate and beginning process for home unit    Time  4    Period  Weeks    Status  Achieved      Additional Long Term Goals   Additional Long Term Goals  Yes      PT LONG TERM GOAL #6   Title  Pt will be independent with updated HEP that incorporates use of Bioness for maximal strengthening/training of LLE (assuming pt qualifies for/obtains Bioness) (Target Date for new LTGs 03/05/17)    Status  New      PT LONG TERM GOAL #7   Title  Patient will be independent donning, doffing, and utilizing Bioness leg cuff, thigh piece, and patient controller.     Status  New      PT LONG TERM GOAL #8   Title  Patient will be able to verbalize a plan to return to community-based exercise program to maintain the gains made during PT.     Status  New            Plan - 01/28/17 1051    Clinical Impression Statement  Patient continues to benefit from use of Bioness functional electrical stimulation and has improved muscular endurance to respond to estim up to 380 feet without resting. Discussed importance of continuing HEP to continue strength gains. Discussed possible use of Bioness with his AFO to strengthen in endrange ROM that Bioness does not achieve on it's own. Pt to bring AFO to next visit.     Rehab Potential  Good    PT Frequency  1x / week    PT Duration  8 weeks    PT Treatment/Interventions  ADLs/Self Care Home Management;Electrical Stimulation;Stair training;Gait training;Therapeutic  activities;Therapeutic exercise;Balance training;Neuromuscular re-education;Patient/family education;Orthotic Fit/Training;Passive range of motion;Manual techniques    PT Next Visit Plan  Provide handouts of HEP for use with Bioness; ?use AFO in addition to Bioness?; L ankle DF/PF strengthening, HS strengthening with Bioness; hip strengthening (abductors/extensors); core stability.  Continue to make recommendations on shoewear?  Availability of pool for swimming for post d/c plan?    Consulted and Agree with Plan of Care  Patient       Patient will benefit from skilled therapeutic intervention in order to improve the following deficits and impairments:  Abnormal gait, Decreased balance, Decreased coordination, Decreased range of motion, Decreased strength, Impaired flexibility, Pain, Impaired UE functional use, Postural dysfunction  Visit Diagnosis: Muscle weakness (generalized)  Other abnormalities of gait and mobility     Problem List Patient Active Problem List   Diagnosis Date Noted  . GERD (gastroesophageal reflux disease) 01/21/2017  . Weakness of left side of body 11/19/2016  . Stress due to illness of family member 06/10/2016  . Syncope 04/23/2016  . Onychomycosis 04/23/2016  . Cerumen impaction  04/23/2016  . Constipation due to opioid therapy 06/13/2014  . Chronic pain syndrome 02/21/2014  . Allergic reaction 02/20/2014  . Neurofibromatosis, type 1 ([place]) 12/14/2013  . Preventative health care 06/23/2010  . [facility] SYNDROME 04/12/2009    Rexanne Mano, PT 01/28/2017, 10:58 AM  [facility] [address] [place], [place], [address] Phone: [phone]   Fax:  [phone]  Name: Larry Riley MRN: [id number] Date of Birth: 1980/03/01

## 2017-02-03 ENCOUNTER — Encounter: Payer: Self-pay | Admitting: Internal Medicine

## 2017-02-11 ENCOUNTER — Encounter: Payer: Self-pay | Admitting: Physical Therapy

## 2017-02-11 ENCOUNTER — Ambulatory Visit: Payer: 59 | Admitting: Physical Therapy

## 2017-02-11 DIAGNOSIS — M6281 Muscle weakness (generalized): Secondary | ICD-10-CM

## 2017-02-11 DIAGNOSIS — R2689 Other abnormalities of gait and mobility: Secondary | ICD-10-CM

## 2017-02-11 NOTE — Therapy (Signed)
[facility] [address] [place], [place], [address] Phone: [phone]   Fax:  [phone]  Physical Therapy Treatment  Patient Details  Name: Larry Riley MRN: [id number] Date of Birth: [birth year] Referring Provider: Dr. Gaspar Cola   Encounter Date: 02/11/2017  PT End of Session - 02/11/17 2049    Visit Number  13    Number of Visits  16    Date for PT Re-Evaluation  03/05/17    Authorization Type  UHC    Authorization Time Period  Insurance calendar resets in July    Authorization - Visit Number  12    Authorization - Number of Visits  20    PT Start Time  1315    PT Stop Time  1400    PT Time Calculation (min)  45 min    Equipment Utilized During Treatment  Other (comment) Bioness    Activity Tolerance  Patient tolerated treatment well    Behavior During Therapy  [facility] for tasks assessed/performed       Past Medical History:  Diagnosis Date  . GERD (gastroesophageal reflux disease) 01/21/2017  . MRSA (methicillin resistant Staphylococcus aureus) 2005   leg  . Neurofibromatosis    tumor down spine and brain    Past Surgical History:  Procedure Laterality Date  . hydrocephalus     [place] 1999[place]  . NECK SURGERY      tumor removal  . SPINE SURGERY  10/2013   c2-t2 fusion  , laminectomy c1-6-- Dr Rigoberto Noel-- [place]  . VASECTOMY  02/2013    There were no vitals filed for this visit.  Subjective Assessment - 02/11/17 2034    Subjective  Continues to work on HEP--not as much as I probably should.    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  No/denies    Pain Onset  More than a month ago                      [facility] Adult PT Treatment/Exercise  - 02/11/17 2038      Transfers   Sit to Stand  6: Modified independent (Device/Increase time)    Number of Reps  10 reps    Comments  in conjunction with Bioness for maximizing contraction of anterior tibialis and hamstrings      Ambulation/Gait   Ambulation/Gait Assistance  6: Modified independent (Device/Increase time)    Ambulation/Gait Assistance Details  with bioness for foot drop and genu recurvatum    Ambulation Distance (Feet)  200 Feet non-consecutive; rest x 3 due to fatigued response of DF    Assistive device  None    Gait Pattern  Step-through pattern;Decreased step length - right;Decreased dorsiflexion - left;Left steppage;Left genu recurvatum    Ambulation Surface  Indoor      Knee/Hip Exercises: Seated   Heel Slides  Strengthening;Left;15 reps knee flex, ankle DF as slide heel back with bioness assist      Electrical Stimulation   Electrical Stimulation Location  left lower leg Bioness cuff; Lt thigh piece for hamstrings    Electrical Stimulation Action  DF with eversion; hamstring for genu recurvatum     Electrical Stimulation Parameters  hamstrings symmetric waveform, _0 , phase duration 300; DF symmetric  waveform, pulse rate _0 , phase duration 300    Electrical Stimulation Goals  Neuromuscular facilitation               PT Short Term Goals - 02/11/17 2057      PT SHORT TERM GOAL #1   Title  Patient will be independent with updated HEP (updated to progress the challenge). (Target for all STGs 02/03/17)    Time  4    Period  Weeks    Status  Unable to assess lack of time; working with [employer] rep      PT SHORT TERM GOAL #2   Title  Patient's gait velocity with Bioness will increase to >=3.5 ft/sec (approaching age-based norm of 4.17 ft/sec)    Time  4    Period  Weeks    Status  Unable to assess lack of time        PT Long Term Goals - 01/04/17 0910      PT LONG TERM GOAL #1   Title  Amb. 1000' on all surfaces with AFO on LLE modified  independently.    Baseline  pt not wearing AFO - 12-01-16; not wearing 12/29/16; 01/04/17 not wearing his AFO, utilized Bioness and combined total 1039f (breaks for adjusting Bioness)    Time  4    Period  Weeks    Status  Partially Met      PT LONG TERM GOAL #2   Title  Independent with HEP for LLE strengthening and cervical ROM/stretching    Baseline  12/29/16 required some cues/instruction in most recent update    Time  4    Period  Weeks    Status  Achieved      PT LONG TERM GOAL #3   Title  Improve TUG score from 10.88 secs to </= 9 secs with use of AFO on LLE to achieve prior functional status with mobility.     Baseline  10.88 secs on 12-01-16;  01/04/17 with Bioness 8.1 sec    Time  4    Period  Weeks    Status  Achieved      PT LONG TERM GOAL #4   Title  Improve 5x sit to stand score from 14.10 secs to </= 11 secs without UE support from standard chair.    Baseline  14.10 secs without UE support on standard chair; 01/04/17 9.22 sec (no UE assist)    Time  4    Period  Weeks    Status  Achieved      PT LONG TERM GOAL #5   Title  Assess use and benefit of Bioness unit for LLE if pt is determined to be appropriate candidate for use of this modality.    Baseline  12/29/16 good candidate and beginning process for home unit    Time  4    Period  Weeks    Status  Achieved      Additional Long Term Goals   Additional Long Term Goals  Yes      PT LONG TERM GOAL #6   Title  Pt will be independent with updated HEP that incorporates use of Bioness for maximal strengthening/training of LLE (assuming pt qualifies for/obtains Bioness) (Target Date for new LTGs 03/05/17)    Status  New      PT LONG TERM GOAL #7   Title  Patient will be independent donning, doffing, and utilizing Bioness leg cuff, thigh piece, and patient controller.     Status  New      PT LONG TERM GOAL #8   Title  Patient will be able to verbalize a plan to return to community-based exercise program to  maintain the gains made during PT.     Status  New            Plan - 02/11/17 2050    Clinical Impression Statement  Session focused on maximizing functionality of Bioness during ambulation and training exercises with input from Bioness representative. Patient tolerated entire session with stimulation of targeted muscles with limited rest breaks when tibialis anterior response fatigued. Discussed putting patient on hold until he receives his home Bioness unit (pending insurance approval) as he has limited yearly visits. Additional visits to be used for set-up and training with home Bioness unit.     Rehab Potential  Good    PT Frequency  1x / week    PT Duration  8 weeks    PT Treatment/Interventions  ADLs/Self Care Home Management;Electrical Stimulation;Stair training;Gait training;Therapeutic activities;Therapeutic exercise;Balance training;Neuromuscular re-education;Patient/family education;Orthotic Fit/Training;Passive range of motion;Manual techniques    PT Next Visit Plan  Provide handouts of HEP for use with Bioness; ?use AFO in addition to Bioness?; L ankle DF/PF strengthening, HS strengthening with Bioness; hip strengthening (abductors/extensors); core stability.  Continue to make recommendations on shoewear?  Availability of pool for swimming for post d/c plan?    Consulted and Agree with Plan of Care  Patient       Patient will benefit from skilled therapeutic intervention in order to improve the following deficits and impairments:  Abnormal gait, Decreased balance, Decreased coordination, Decreased range of motion, Decreased strength, Impaired flexibility, Pain, Impaired UE functional use, Postural dysfunction  Visit Diagnosis: Muscle weakness (generalized)  Other abnormalities of gait and mobility     Problem List Patient Active Problem List   Diagnosis Date Noted  . GERD (gastroesophageal reflux disease) 01/21/2017  . Weakness of left side of body 11/19/2016  .  Stress due to illness of family member 06/10/2016  . Syncope 04/23/2016  . Onychomycosis 04/23/2016  . Cerumen impaction 04/23/2016  . Constipation due to opioid therapy 06/13/2014  . Chronic pain syndrome 02/21/2014  . Allergic reaction 02/20/2014  . Neurofibromatosis, type 1 ([place]) 12/14/2013  . Preventative health care 06/23/2010  . [facility] SYNDROME 04/12/2009    Rexanne Mano, PT 02/11/2017, 8:59 PM  [facility] [address] [place], [place], [address] Phone: [phone]   Fax:  [phone]  Name: Cornelio Parkerson MRN: [id number] Date of Birth: 1980-07-23

## 2017-02-17 ENCOUNTER — Other Ambulatory Visit: Payer: Self-pay | Admitting: Family

## 2017-02-17 ENCOUNTER — Encounter: Payer: Self-pay | Admitting: Internal Medicine

## 2017-02-17 MED ORDER — CYCLOBENZAPRINE HCL 5 MG PO TABS: 5.0000 mg | ORAL_TABLET | Freq: Three times a day (TID) | ORAL | 1 refills | Status: DC | PRN

## 2017-02-18 ENCOUNTER — Ambulatory Visit: Payer: Self-pay | Admitting: Physical Therapy

## 2017-02-25 ENCOUNTER — Ambulatory Visit: Payer: Self-pay | Admitting: Physical Therapy

## 2017-03-04 ENCOUNTER — Ambulatory Visit: Payer: Self-pay | Admitting: Physical Therapy

## 2017-03-09 ENCOUNTER — Ambulatory Visit (INDEPENDENT_AMBULATORY_CARE_PROVIDER_SITE_OTHER): Payer: 59 | Admitting: Internal Medicine

## 2017-03-09 ENCOUNTER — Encounter: Payer: Self-pay | Admitting: Internal Medicine

## 2017-03-09 VITALS — BP 136/88 | HR 106 | Temp 98.0°F | Ht 68.0 in | Wt 200.0 lb

## 2017-03-09 DIAGNOSIS — H6123 Impacted cerumen, bilateral: Secondary | ICD-10-CM

## 2017-03-09 DIAGNOSIS — S61011A Laceration without foreign body of right thumb without damage to nail, initial encounter: Secondary | ICD-10-CM | POA: Insufficient documentation

## 2017-03-09 DIAGNOSIS — H6091 Unspecified otitis externa, right ear: Secondary | ICD-10-CM | POA: Diagnosis not present

## 2017-03-09 DIAGNOSIS — J019 Acute sinusitis, unspecified: Secondary | ICD-10-CM | POA: Insufficient documentation

## 2017-03-09 MED ORDER — AZITHROMYCIN 250 MG PO TABS: ORAL_TABLET | ORAL | 1 refills | Status: DC

## 2017-03-09 MED ORDER — NEOMYCIN-POLYMYXIN-HC 1 % OT SOLN: 3.0000 [drp] | Freq: Four times a day (QID) | OTIC | 0 refills | Status: AC

## 2017-03-09 NOTE — Progress Notes (Signed)
Subjective:    Patient ID: Larry Riley, male    DOB: 22-Jan-1980, 37 y.o.   MRN: [id number]  HPI   Here with 2-3 days acute onset fever, facial pain, pressure, headache, general weakness and malaise, and greenish d/c, with mild ST and cough, but pt denies chest pain, wheezing, increased sob or doe, orthopnea, PND, increased LE swelling, palpitations, dizziness or syncope  But also has specific right ear pain, red, swelling as well without d/c, but has also had intermittent hearing loss as well - ? Wax.  Also has a small laceration to the distal right thumb tip after cut with cooking.  No worsening finger red, tender, swelling Past Medical History:  Diagnosis Date  . GERD (gastroesophageal reflux disease) 01/21/2017  . MRSA (methicillin resistant Staphylococcus aureus) 2005   leg  . Neurofibromatosis    tumor down spine and brain   Past Surgical History:  Procedure Laterality Date  . hydrocephalus     [place] 1999[place]  . NECK SURGERY      tumor removal  . SPINE SURGERY  10/2013   c2-t2 fusion  , laminectomy c1-6-- Dr [facility]nald [facility]-- [place]  . VASECTOMY  02/2013    reports that  has never smoked. he has never used smokeless tobacco. He reports that he drinks about 1.8 oz of alcohol per week. He reports that he does not use drugs. family history includes Arthritis in his father, paternal grandfather, and paternal grandmother; Diabetes in his mother and paternal grandfather; Healthy in his maternal grandfather and maternal grandmother; Hyperlipidemia in his paternal grandfather; Hypertension in his father and paternal grandfather; Prostate cancer in his father and unknown relative. No Known Allergies Current Outpatient Medications on File Prior to Visit  Medication Sig Dispense Refill  . acetaminophen (TYLENOL) 325 MG tablet Take 650 mg by mouth every 6 (six) hours as needed for mild pain, moderate pain, fever or headache.     . bisacodyl (DULCOLAX) 5 MG EC tablet Take 5 mg by mouth daily as  needed for mild constipation.     . cyclobenzaprine (FLEXERIL) 10 MG tablet TAKE ONE TABLET BY MOUTH THREE TIMES A DAY AS NEEDED FOR MUSCLE SPASMS 90 tablet 0  . cyclobenzaprine (FLEXERIL) 5 MG tablet Take 1 tablet (5 mg total) by mouth 3 (three) times daily as needed for muscle spasms. 90 tablet 1  . diphenhydrAMINE (BENADRYL) 25 mg capsule Take 25 mg by mouth at bedtime as needed for allergies or sleep.    Marland Kitchen. EPINEPHrine (EPIPEN 2-PAK) 0.3 mg/0.3 mL IJ SOAJ injection Inject 0.3 mLs (0.3 mg total) into the muscle once. 1 Device 1  . guaiFENesin (MUCINEX) 600 MG 12 hr tablet Take 600 mg by mouth 2 (two) times daily as needed for cough or to loosen phlegm.    Marland Kitchen. ibuprofen (ADVIL,MOTRIN) 200 MG tablet Take 400 mg by mouth every 6 (six) hours as needed for headache, mild pain or moderate pain.    Marland Kitchen. omeprazole (PRILOSEC) 20 MG capsule Take 20 mg by mouth daily as needed (for acid reflux).    . pantoprazole (PROTONIX) 40 MG tablet Take 1 tablet (40 mg total) by mouth daily. 90 tablet 3  . sildenafil (REVATIO) 20 MG tablet Take 1-5 tablets by mouth daily as needed. 50 tablet 1  . terbinafine (LAMISIL) 250 MG tablet Please take one a day x 7days, repeat every 4 weeks x 4 months 28 tablet 0  . traMADol (ULTRAM) 50 MG tablet Take 1 tablet (50 mg total) by mouth  every 8 (eight) hours as needed. 90 tablet 1   No current facility-administered medications on file prior to visit.    Review of Systems  Constitutional: Negative for other unusual diaphoresis or sweats HENT: Negative for ear discharge or swelling Eyes: Negative for other worsening visual disturbances Respiratory: Negative for stridor or other swelling  Gastrointestinal: Negative for worsening distension or other blood Genitourinary: Negative for retention or other urinary change Musculoskeletal: Negative for other MSK pain or swelling Skin: Negative for color change or other new lesions Neurological: Negative for worsening tremors and other  numbness  Psychiatric/Behavioral: Negative for worsening agitation or other fatigue All other system neg per pt    Objective:   Physical Exam BP 136/88   Pulse (!) 106   Temp 98 F (36.7 C) (Oral)   Ht 5\' 8"  (1.727 m)   Wt 200 lb (90.7 kg)   SpO2 98%   BMI 30.41 kg/m  VS noted, mild ill Constitutional: Pt appears in NAD HENT: Head: NCAT.  Right Ear: External ear normal. Right ext canal with red, tender swelling but no d/c, also has right wax impaction removed with irrigation Left Ear: External ear normal.  Eyes: . Pupils are equal, round, and reactive to light. Conjunctivae and EOM are normal Bilat tm's with mild erythema.  Max sinus areas mild tender.  Pharynx with mild erythema, no exudate   Nose: without d/c or deformity Neck: Neck supple. Gross normal ROM Cardiovascular: Normal rate and regular rhythm.   Pulmonary/Chest: Effort normal and breath sounds without rales or wheezing.  Abd:  Soft, NT, ND, + BS, no organomegaly Neurological: Pt is alert. At baseline orientation, motor grossly intact Skin: Skin is warm. No rashes, other new lesions, no LE edema but right distal thumb with 1/4 cm superficial laceration with edges intact and trace swelling but no red, tender, swelling or d/c Psychiatric: Pt behavior is normal without agitation  No other exam findings    Assessment & Plan:

## 2017-03-09 NOTE — Patient Instructions (Addendum)
Please take all new medication as prescribed - the pill antibiotic, and the ear drops for the right ear  Your right ear was irrigated in the office today  Please continue all other medications as before, and refills have been done if requested.  Please have the pharmacy call with any other refills you may need.  Please keep your appointments with your specialists as you may have planned

## 2017-03-11 ENCOUNTER — Ambulatory Visit: Payer: Self-pay | Admitting: Physical Therapy

## 2017-03-11 ENCOUNTER — Encounter: Payer: Self-pay | Admitting: Internal Medicine

## 2017-03-11 NOTE — Assessment & Plan Note (Signed)
Mild to mod, for antibx course,  to f/u any worsening symptoms or concerns 

## 2017-03-11 NOTE — Assessment & Plan Note (Signed)
Resolved with irrigation, hearing improved

## 2017-03-11 NOTE — Assessment & Plan Note (Signed)
Small non infected laceration, no need for further tx at this time

## 2017-03-29 ENCOUNTER — Ambulatory Visit (INDEPENDENT_AMBULATORY_CARE_PROVIDER_SITE_OTHER): Payer: 59 | Admitting: Podiatry

## 2017-03-29 ENCOUNTER — Encounter: Payer: Self-pay | Admitting: Podiatry

## 2017-03-29 DIAGNOSIS — B351 Tinea unguium: Secondary | ICD-10-CM

## 2017-03-29 DIAGNOSIS — M79675 Pain in left toe(s): Secondary | ICD-10-CM

## 2017-03-29 DIAGNOSIS — M79674 Pain in right toe(s): Secondary | ICD-10-CM | POA: Diagnosis not present

## 2017-03-29 NOTE — Progress Notes (Signed)
Subjective:   Patient ID: Larry Riley, male   DOB: 37 y.o.   MRN: [id number]   HPI Patient presents stating that he has not been compliant in taking the medication as he was supposed to.  Patient states that his skin is much better but his nails are continually discolored   ROS      Objective:  Physical Exam  Neurovascular status intact with patient having nail disease 1-5 both feet with thick yellow disease but nowhere near as thick as they were previously with patient also having much improved skin bilateral     Assessment:  Combination of mycotic nail infection with skin manifestations that has improved with oral antifungal therapy     Plan:  At this point I have recommended the continuation of pulse Lamisil therapy and we may do 60 days later in the year but not at the current time.  Patient will do the 7 days/month and will be seen back 6 months or earlier if needed

## 2017-04-05 ENCOUNTER — Encounter: Payer: Self-pay | Admitting: Internal Medicine

## 2017-04-05 DIAGNOSIS — G894 Chronic pain syndrome: Secondary | ICD-10-CM

## 2017-04-05 DIAGNOSIS — Q8501 Neurofibromatosis, type 1: Secondary | ICD-10-CM

## 2017-04-05 MED ORDER — TRAMADOL HCL 50 MG PO TABS: 50.0000 mg | ORAL_TABLET | Freq: Three times a day (TID) | ORAL | 1 refills | Status: DC | PRN

## 2017-04-05 NOTE — Telephone Encounter (Signed)
Done erx 

## 2017-05-11 ENCOUNTER — Encounter: Payer: Self-pay | Admitting: Physical Therapy

## 2017-05-11 NOTE — Therapy (Signed)
[facility] [address] [place], [place], [address] Phone: [phone]   Fax:  [phone]  Patient Details  Name: Larry Riley MRN: [id number] Date of Birth: [birth year] Referring Provider:  No ref. provider found  Encounter Date: 05/11/2017   PHYSICAL THERAPY DISCHARGE SUMMARY  Visits from Start of Care: 13  Current functional level related to goals / functional outcomes: See last progress note of 02/11/17.   Patient has been on hold for PT while attempting to get approval for a home Bioness unit for LE. Attempted to contact patient to discuss where he is in the process with no reply. Spoke with [employer], Sharyn Lull, and told he is trying to [employer] denial and potentially looking for other funding.    Remaining deficits: Continues with gait deficits and risk of falling due to foot drop and genu recurvatum. Has an AFO he currently uses, however responded well to the Bioness unit and is a good candidate for home unit. j   Education / Equipment: HEP/assisted in initiating application for Bioness lower leg and thigh cuff  Plan: Patient agrees to discharge.  Patient goals were partially met. Patient is being discharged due to not returning since the last visit.  ?????    At this time, if [name] receives approval for his Bioness unit, he will need to request a new referral to return to PT.     Rexanne Mano, PT 05/11/2017, 8:26 PM  [facility] [address] [place], [place], [address] Phone: [phone]   Fax:  [phone]

## 2017-05-20 ENCOUNTER — Encounter: Payer: Self-pay | Admitting: Internal Medicine

## 2017-06-08 ENCOUNTER — Encounter: Payer: Self-pay | Admitting: Internal Medicine

## 2017-06-08 DIAGNOSIS — G894 Chronic pain syndrome: Secondary | ICD-10-CM

## 2017-06-08 DIAGNOSIS — Q8501 Neurofibromatosis, type 1: Secondary | ICD-10-CM

## 2017-06-08 DIAGNOSIS — H9190 Unspecified hearing loss, unspecified ear: Secondary | ICD-10-CM

## 2017-06-08 MED ORDER — TRAMADOL HCL 50 MG PO TABS: 50.0000 mg | ORAL_TABLET | Freq: Three times a day (TID) | ORAL | 1 refills | Status: DC | PRN

## 2017-06-08 MED ORDER — SILDENAFIL CITRATE 20 MG PO TABS: ORAL_TABLET | ORAL | 1 refills | Status: DC

## 2017-06-28 ENCOUNTER — Encounter: Payer: Self-pay | Admitting: Internal Medicine

## 2017-06-28 MED ORDER — CYCLOBENZAPRINE HCL 10 MG PO TABS: ORAL_TABLET | ORAL | 0 refills | Status: DC

## 2017-07-29 ENCOUNTER — Encounter: Payer: Self-pay | Admitting: Internal Medicine

## 2017-08-05 ENCOUNTER — Encounter: Payer: Self-pay | Admitting: Internal Medicine

## 2017-08-05 DIAGNOSIS — Q8501 Neurofibromatosis, type 1: Secondary | ICD-10-CM

## 2017-08-05 DIAGNOSIS — G894 Chronic pain syndrome: Secondary | ICD-10-CM

## 2017-08-06 MED ORDER — TRAMADOL HCL 50 MG PO TABS: 50.0000 mg | ORAL_TABLET | Freq: Three times a day (TID) | ORAL | 2 refills | Status: DC | PRN

## 2017-08-28 ENCOUNTER — Other Ambulatory Visit: Payer: Self-pay | Admitting: Internal Medicine

## 2017-10-08 ENCOUNTER — Encounter: Payer: Self-pay | Admitting: Internal Medicine

## 2017-10-08 DIAGNOSIS — G894 Chronic pain syndrome: Secondary | ICD-10-CM

## 2017-10-08 DIAGNOSIS — Q8501 Neurofibromatosis, type 1: Secondary | ICD-10-CM

## 2017-10-08 MED ORDER — TRAMADOL HCL 50 MG PO TABS: 50.0000 mg | ORAL_TABLET | Freq: Three times a day (TID) | ORAL | 2 refills | Status: DC | PRN

## 2017-10-08 NOTE — Telephone Encounter (Signed)
Done erx 

## 2017-10-24 ENCOUNTER — Encounter: Payer: Self-pay | Admitting: Internal Medicine

## 2017-10-24 DIAGNOSIS — Q8501 Neurofibromatosis, type 1: Secondary | ICD-10-CM

## 2017-10-24 DIAGNOSIS — G894 Chronic pain syndrome: Secondary | ICD-10-CM

## 2017-10-25 ENCOUNTER — Ambulatory Visit: Payer: 59 | Admitting: Podiatry

## 2017-10-25 MED ORDER — SILDENAFIL CITRATE 20 MG PO TABS: ORAL_TABLET | ORAL | 1 refills | Status: DC

## 2017-10-25 NOTE — Telephone Encounter (Signed)
Shirron to let pharmacy know - ok for tramadol refill early this time only

## 2017-10-26 NOTE — Telephone Encounter (Signed)
Pharmacy informed this is ok

## 2017-12-04 DIAGNOSIS — S61200A Unspecified open wound of right index finger without damage to nail, initial encounter: Secondary | ICD-10-CM | POA: Diagnosis not present

## 2017-12-04 DIAGNOSIS — S61202A Unspecified open wound of right middle finger without damage to nail, initial encounter: Secondary | ICD-10-CM | POA: Diagnosis not present

## 2017-12-04 DIAGNOSIS — S61204A Unspecified open wound of right ring finger without damage to nail, initial encounter: Secondary | ICD-10-CM | POA: Diagnosis not present

## 2017-12-16 ENCOUNTER — Ambulatory Visit: Payer: BLUE CROSS/BLUE SHIELD | Admitting: Family Medicine

## 2017-12-16 ENCOUNTER — Encounter: Payer: Self-pay | Admitting: Family Medicine

## 2017-12-16 VITALS — BP 134/78 | HR 59 | Temp 97.8°F | Ht 68.0 in | Wt 201.0 lb

## 2017-12-16 DIAGNOSIS — S61214D Laceration without foreign body of right ring finger without damage to nail, subsequent encounter: Secondary | ICD-10-CM

## 2017-12-16 NOTE — Progress Notes (Signed)
Subjective:    Patient ID: Trevell Riley, male    DOB: [birth year], 37 y.o.   MRN: [id number]  HPI  Larry Riley is a 37 year old male who presents today for suture of removal of his right ring finger. He reports that he was reaching toward a ceiling fan and received a laceration. This occurred while he was out of town at [employer] and he went to an urgent care facility and 3 sutures were placed approximately 11 days ago.  He states that he has not experienced any erythema, swelling, pain, or drainage from the site. Area has been healing well and he has kept area clean and dry. Tetanus is UTD He is here for suture removal today.   Review of Systems  Constitutional: Negative for chills and fever.  Respiratory: Negative for cough.   Cardiovascular: Negative for chest pain.  Gastrointestinal: Negative for abdominal pain.  Skin: Negative for rash.       Right ring finger laceration   Past Medical History:  Diagnosis Date  . GERD (gastroesophageal reflux disease) 01/21/2017  . MRSA (methicillin resistant Staphylococcus aureus) 2005   leg  . Neurofibromatosis    tumor down spine and brain     Social History   Socioeconomic History  . Marital status: Married    Spouse name: Not on file  . Number of children: 0  . Years of education: 58  . Highest education level: Not on file  Occupational History  . Occupation: [employer]  . Financial resource strain: Not on file  . Food insecurity:    Worry: Not on file    Inability: Not on file  . Transportation needs:    Medical: Not on file    Non-medical: Not on file  Tobacco Use  . Smoking status: Never Smoker  . Smokeless tobacco: Never Used  Substance and Sexual Activity  . Alcohol use: Yes    Alcohol/week: 3.0 standard drinks    Types: 3 Glasses of wine per week  . Drug use: No  . Sexual activity: Never    Partners: Female  Lifestyle  . Physical activity:    Days per week: Not on file    Minutes per  session: Not on file  . Stress: Not on file  Relationships  . Social connections:    Talks on phone: Not on file    Gets together: Not on file    Attends religious service: Not on file    Active member of club or organization: Not on file    Attends meetings of clubs or organizations: Not on file    Relationship status: Not on file  . Intimate partner violence:    Fear of current or ex partner: Not on file    Emotionally abused: Not on file    Physically abused: Not on file    Forced sexual activity: Not on file  Other Topics Concern  . Not on file  Social History Narrative   Fun: Scientist, physiological, fishing, hiking, skiing   Denies religious beliefs effecting health care.     Past Surgical History:  Procedure Laterality Date  . hydrocephalus     shunt 1999[place]  . NECK SURGERY      tumor removal  . SPINE SURGERY  10/2013   c2-t2 fusion  , laminectomy c1-6-- Dr Raynald Kemp-- [facility]  . VASECTOMY  02/2013    Family History  Problem Relation Age of Onset  . Diabetes Mother   .  Arthritis Father   . Hypertension Father   . Prostate cancer Father   . Arthritis Paternal Grandmother   . Arthritis Paternal Grandfather   . Diabetes Paternal Grandfather   . Hyperlipidemia Paternal Grandfather   . Hypertension Paternal Grandfather   . Healthy Maternal Grandmother   . Healthy Maternal Grandfather   . Prostate cancer Unknown     No Known Allergies  Current Outpatient Medications on File Prior to Visit  Medication Sig Dispense Refill  . acetaminophen (TYLENOL) 325 MG tablet Take 650 mg by mouth every 6 (six) hours as needed for mild pain, moderate pain, fever or headache.     Marland Kitchen azithromycin (ZITHROMAX Z-PAK) 250 MG tablet 2 tab by mouth day 1, then 1 per day 6 tablet 1  . bisacodyl (DULCOLAX) 5 MG EC tablet Take 5 mg by mouth daily as needed for mild constipation.     . cyclobenzaprine (FLEXERIL) 10 MG tablet TAKE ONE TABLET BY MOUTH THREE TIMES A DAY AS NEEDED FOR MUSCLE SPASMS 90 tablet  1  . cyclobenzaprine (FLEXERIL) 5 MG tablet Take 1 tablet (5 mg total) by mouth 3 (three) times daily as needed for muscle spasms. 90 tablet 1  . diphenhydrAMINE (BENADRYL) 25 mg capsule Take 25 mg by mouth at bedtime as needed for allergies or sleep.    Marland Kitchen EPINEPHrine (EPIPEN 2-PAK) 0.3 mg/0.3 mL IJ SOAJ injection Inject 0.3 mLs (0.3 mg total) into the muscle once. 1 Device 1  . guaiFENesin (MUCINEX) 600 MG 12 hr tablet Take 600 mg by mouth 2 (two) times daily as needed for cough or to loosen phlegm.    Marland Kitchen ibuprofen (ADVIL,MOTRIN) 200 MG tablet Take 400 mg by mouth every 6 (six) hours as needed for headache, mild pain or moderate pain.    Marland Kitchen omeprazole (PRILOSEC) 20 MG capsule Take 20 mg by mouth daily as needed (for acid reflux).    . pantoprazole (PROTONIX) 40 MG tablet Take 1 tablet (40 mg total) by mouth daily. 90 tablet 3  . sildenafil (REVATIO) 20 MG tablet Take 1-5 tablets by mouth daily as needed. 50 tablet 1  . terbinafine (LAMISIL) 250 MG tablet Please take one a day x 7days, repeat every 4 weeks x 4 months 28 tablet 0  . traMADol (ULTRAM) 50 MG tablet Take 1 tablet (50 mg total) by mouth every 8 (eight) hours as needed. 90 tablet 2   No current facility-administered medications on file prior to visit.     BP 134/78 (BP Location: Left Arm, Patient Position: Sitting, Cuff Size: Normal)   Pulse (!) 59   Temp 97.8 F (36.6 C) (Oral)   Ht 5\' 8"  (1.727 m)   Wt 201 lb 0.6 oz (91.2 kg)   SpO2 99%   BMI 30.57 kg/m       Objective:   Physical Exam  Constitutional: He is oriented to person, place, and time. He appears well-developed and well-nourished.  Eyes: Pupils are equal, round, and reactive to light. No scleral icterus.  Cardiovascular: Normal rate and normal heart sounds.  Pulmonary/Chest: Effort normal and breath sounds normal.  Neurological: He is alert and oriented to person, place, and time.  Skin: Skin is warm and dry. Capillary refill takes less than 2 seconds.    Laceration with 3 sutures present on lateral aspect of right fourth finger. No erythema, warmth, edema, or drainage present.  Psychiatric: He has a normal mood and affect. His behavior is normal. Judgment and thought content normal.  Assessment & Plan:   1. Laceration of right ring finger without foreign body without damage to nail, subsequent encounter Healing well; no erythema, warmth, or drainage present. Three sutures removed without difficulty. Advised keeping area clean with soap and water and apply over the counter antibiotic ointment and cover with a bandaid. Follow up if area does not continue to heal or new symptoms appear of erythema, swelling, or drainage from the area. Return precautions provided.  Roddie McJulia Tawan Degroote, FNP-C

## 2017-12-16 NOTE — Patient Instructions (Signed)
Suture Removal, Care After Refer to this sheet in the next few weeks. These instructions provide you with information on caring for yourself after your procedure. Your health care provider may also give you more specific instructions. Your treatment has been planned according to current medical practices, but problems sometimes occur. Call your health care provider if you have any problems or questions after your procedure. What can I expect after the procedure? After your stitches (sutures) are removed, it is typical to have the following:  Some discomfort and swelling in the wound area.  Slight redness in the area.  Follow these instructions at home:  If you have skin adhesive strips over the wound area, do not take the strips off. They will fall off on their own in a few days. If the strips remain in place after 14 days, you may remove them.  Change any bandages (dressings) at least once a day or as directed by your health care provider. If the bandage sticks, soak it off with warm, soapy water.  Apply cream or ointment only as directed by your health care provider. If using cream or ointment, wash the area with soap and water 2 times a day to remove all the cream or ointment. Rinse off the soap and pat the area dry with a clean towel.  Keep the wound area dry and clean. If the bandage becomes wet or dirty, or if it develops a bad smell, change it as soon as possible.  Continue to protect the wound from injury.  Use sunscreen when out in the sun. New scars become sunburned easily. Contact a health care provider if:  You have increasing redness, swelling, or pain in the wound.  You see pus coming from the wound.  You have a fever.  You notice a bad smell coming from the wound or dressing.  Your wound breaks open (edges not staying together). This information is not intended to replace advice given to you by your health care provider. Make sure you discuss any questions you have  with your health care provider. Document Released: 09/23/2000 Document Revised: 06/06/2015 Document Reviewed: 08/10/2012 Elsevier Interactive Patient Education  2017 Elsevier Inc.  

## 2017-12-21 ENCOUNTER — Encounter: Payer: Self-pay | Admitting: Internal Medicine

## 2017-12-22 MED ORDER — CYCLOBENZAPRINE HCL 10 MG PO TABS: ORAL_TABLET | ORAL | 0 refills | Status: DC

## 2018-01-26 ENCOUNTER — Encounter: Payer: Self-pay | Admitting: Internal Medicine

## 2018-02-11 ENCOUNTER — Other Ambulatory Visit: Payer: Self-pay | Admitting: Internal Medicine

## 2018-02-11 MED ORDER — CYCLOBENZAPRINE HCL 10 MG PO TABS: ORAL_TABLET | ORAL | 0 refills | Status: DC

## 2018-02-24 ENCOUNTER — Encounter: Payer: Self-pay | Admitting: Internal Medicine

## 2018-03-24 ENCOUNTER — Encounter: Payer: Self-pay | Admitting: Internal Medicine

## 2018-04-01 ENCOUNTER — Other Ambulatory Visit: Payer: Self-pay | Admitting: Internal Medicine

## 2018-04-01 MED ORDER — CYCLOBENZAPRINE HCL 10 MG PO TABS: ORAL_TABLET | ORAL | 0 refills | Status: DC

## 2018-04-04 MED ORDER — CYCLOBENZAPRINE HCL 10 MG PO TABS: ORAL_TABLET | ORAL | 0 refills | Status: DC

## 2018-04-08 ENCOUNTER — Encounter: Payer: Self-pay | Admitting: Internal Medicine

## 2018-04-08 MED ORDER — SILDENAFIL CITRATE 20 MG PO TABS: ORAL_TABLET | ORAL | 1 refills | Status: DC

## 2018-04-20 ENCOUNTER — Encounter: Payer: Self-pay | Admitting: Internal Medicine

## 2018-05-02 ENCOUNTER — Encounter: Payer: Self-pay | Admitting: Internal Medicine

## 2018-05-02 DIAGNOSIS — G894 Chronic pain syndrome: Secondary | ICD-10-CM

## 2018-05-02 DIAGNOSIS — Q8501 Neurofibromatosis, type 1: Secondary | ICD-10-CM

## 2018-05-03 MED ORDER — TRAMADOL HCL 50 MG PO TABS: 50.0000 mg | ORAL_TABLET | Freq: Three times a day (TID) | ORAL | 0 refills | Status: DC | PRN

## 2018-05-03 NOTE — Telephone Encounter (Signed)
Ok for staff to contact pt-   Tramadol done erx #90 no refill  Please to make ROV (inperson or virtual) for further refills

## 2018-05-05 ENCOUNTER — Telehealth: Payer: Self-pay

## 2018-05-05 NOTE — Telephone Encounter (Signed)
Key: [id number]

## 2018-05-05 NOTE — Telephone Encounter (Signed)
approved through 11/04/2018

## 2018-06-01 ENCOUNTER — Other Ambulatory Visit: Payer: Self-pay | Admitting: Internal Medicine

## 2018-06-01 MED ORDER — CYCLOBENZAPRINE HCL 10 MG PO TABS: ORAL_TABLET | ORAL | 0 refills | Status: DC

## 2018-06-22 ENCOUNTER — Encounter: Payer: Self-pay | Admitting: Internal Medicine

## 2018-06-22 ENCOUNTER — Other Ambulatory Visit: Payer: Self-pay

## 2018-06-22 ENCOUNTER — Ambulatory Visit (INDEPENDENT_AMBULATORY_CARE_PROVIDER_SITE_OTHER): Payer: Managed Care, Other (non HMO) | Admitting: Internal Medicine

## 2018-06-22 DIAGNOSIS — Z0000 Encounter for general adult medical examination without abnormal findings: Secondary | ICD-10-CM | POA: Diagnosis not present

## 2018-06-22 DIAGNOSIS — Z114 Encounter for screening for human immunodeficiency virus [HIV]: Secondary | ICD-10-CM | POA: Diagnosis not present

## 2018-06-22 DIAGNOSIS — G894 Chronic pain syndrome: Secondary | ICD-10-CM

## 2018-06-22 DIAGNOSIS — K219 Gastro-esophageal reflux disease without esophagitis: Secondary | ICD-10-CM | POA: Diagnosis not present

## 2018-06-22 MED ORDER — TRAMADOL HCL 50 MG PO TABS: 50.0000 mg | ORAL_TABLET | Freq: Three times a day (TID) | ORAL | 5 refills | Status: DC | PRN

## 2018-06-22 MED ORDER — PANTOPRAZOLE SODIUM 40 MG PO TBEC: 40.0000 mg | DELAYED_RELEASE_TABLET | Freq: Every day | ORAL | 3 refills | Status: DC

## 2018-06-22 MED ORDER — OMEPRAZOLE 20 MG PO CPDR: 20.0000 mg | DELAYED_RELEASE_CAPSULE | Freq: Every day | ORAL | 3 refills | Status: DC | PRN

## 2018-06-22 MED ORDER — CELECOXIB 200 MG PO CAPS: 200.0000 mg | ORAL_CAPSULE | Freq: Two times a day (BID) | ORAL | 3 refills | Status: DC | PRN

## 2018-06-22 MED ORDER — TIZANIDINE HCL 4 MG PO TABS: 4.0000 mg | ORAL_TABLET | Freq: Four times a day (QID) | ORAL | 5 refills | Status: DC | PRN

## 2018-06-22 NOTE — Assessment & Plan Note (Signed)

## 2018-06-22 NOTE — Patient Instructions (Signed)
Please take all new medication as prescribed - the celebrex and the new muscle relaxer  OK to stop the flexeril, and Advil  Please continue all other medications as before, and refills have been done if requested - including the tramadol for breakthrough pain  Please have the pharmacy call with any other refills you may need.  Please continue your efforts at being more active, low cholesterol diet, and weight control.  You are otherwise up to date with prevention measures today.  Please keep your appointments with your specialists as you may have planned  You are given the Prescription order to be picked up for Labs to be done at [facility]  Please return in 1 year for your yearly visit, or sooner if needed

## 2018-06-22 NOTE — Assessment & Plan Note (Signed)
Ok for change nsaid to celebrex asd, and change flexeril to tizanidine and  to f/u any worsening symptoms or concerns

## 2018-06-22 NOTE — Progress Notes (Signed)
Patient ID: Larry Riley, male   DOB: 11-Mar-1980, 38 y.o.   MRN: [id number]  Virtual Visit via Video Note  I connected with Larry Riley on 06/22/18 at  8:00 AM EDT by a video enabled telemedicine application and verified that I am speaking with the correct person using two identifiers.  Location: Patient: in his car Provider: at office   I discussed the limitations of evaluation and management by telemedicine and the availability of in person appointments. The patient expressed understanding and agreed to proceed.  History of Present Illness: Here for wellness and f/u;  Overall doing ok;  Pt denies Chest pain, worsening SOB, DOE, wheezing, orthopnea, PND, worsening LE edema, palpitations, dizziness or syncope.  Pt denies neurological change such as new headache, facial or extremity weakness.  Pt denies polydipsia, polyuria, or low sugar symptoms. Pt states overall good compliance with treatment and medications, good tolerability, and has been trying to follow appropriate diet.  Pt denies worsening depressive symptoms, suicidal ideation or panic. No fever, night sweats, wt loss, loss of appetite, or other constitutional symptoms.  Pt states good ability with ADL's, has low fall risk, home safety reviewed and adequate, no other significant changes in hearing or vision, and only occasionally active with exercise. Does have contd chronic pain, with advil and flexeril not working well.  Flexeril 10 mg helps but seems to cause ED worsening. Past Medical History:  Diagnosis Date  . GERD (gastroesophageal reflux disease) 01/21/2017  . MRSA (methicillin resistant Staphylococcus aureus) 2005   leg  . Neurofibromatosis    tumor down spine and brain   Past Surgical History:  Procedure Laterality Date  . hydrocephalus     shunt 1999[place]  . NECK SURGERY      tumor removal  . SPINE SURGERY  10/2013   c2-t2 fusion  , laminectomy c1-6-- Dr Rigoberto Noel-- [facility]  . VASECTOMY  02/2013    reports that he has  never smoked. He has never used smokeless tobacco. He reports current alcohol use of about 3.0 standard drinks of alcohol per week. He reports that he does not use drugs. family history includes Arthritis in his father, paternal grandfather, and paternal grandmother; Diabetes in his mother and paternal grandfather; Healthy in his maternal grandfather and maternal grandmother; Hyperlipidemia in his paternal grandfather; Hypertension in his father and paternal grandfather; Prostate cancer in his father and unknown relative. No Known Allergies . Current Outpatient Medications on File Prior to Visit  Medication Sig Dispense Refill  . acetaminophen (TYLENOL) 325 MG tablet Take 650 mg by mouth every 6 (six) hours as needed for mild pain, moderate pain, fever or headache.     . bisacodyl (DULCOLAX) 5 MG EC tablet Take 5 mg by mouth daily as needed for mild constipation.     . diphenhydrAMINE (BENADRYL) 25 mg capsule Take 25 mg by mouth at bedtime as needed for allergies or sleep.    Marland Kitchen EPINEPHrine (EPIPEN 2-PAK) 0.3 mg/0.3 mL IJ SOAJ injection Inject 0.3 mLs (0.3 mg total) into the muscle once. 1 Device 1  . guaiFENesin (MUCINEX) 600 MG 12 hr tablet Take 600 mg by mouth 2 (two) times daily as needed for cough or to loosen phlegm.    . sildenafil (REVATIO) 20 MG tablet Take 1-5 tablets by mouth daily as needed. 50 tablet 1   No current facility-administered medications on file prior to visit.    Observations/Objective: Alert, NAD, appropriate mood and affect, resps normal, cn 2-12 intact, moves all 4s, no visible  rash or swelling Lab Results  Component Value Date   WBC 8.0 06/15/2016   HGB 15.1 06/15/2016   HCT 43.6 06/15/2016   PLT 430.0 (H) 06/15/2016   GLUCOSE 85 06/15/2016   CHOL 159 06/15/2016   TRIG 82.0 06/15/2016   HDL 51.60 06/15/2016   LDLCALC 91 06/15/2016   ALT 40 08/21/2016   AST 26 08/21/2016   NA 139 06/15/2016   K 4.1 06/15/2016   CL 105 06/15/2016   CREATININE 0.89 06/15/2016    BUN 18 06/15/2016   CO2 29 06/15/2016   TSH 3.25 06/13/2015    Assessment and Plan: See notes  Follow Up Instructions: See notes   I discussed the assessment and treatment plan with the patient. The patient was provided an opportunity to ask questions and all were answered. The patient agreed with the plan and demonstrated an understanding of the instructions.   The patient was advised to call back or seek an in-person evaluation if the symptoms worsen or if the condition fails to improve as anticipated.  Larry BarreJames Kerolos Nehme, MD

## 2018-06-24 ENCOUNTER — Ambulatory Visit (INDEPENDENT_AMBULATORY_CARE_PROVIDER_SITE_OTHER): Payer: 59 | Admitting: Psychology

## 2018-06-24 DIAGNOSIS — F411 Generalized anxiety disorder: Secondary | ICD-10-CM | POA: Diagnosis not present

## 2018-07-06 ENCOUNTER — Other Ambulatory Visit: Payer: Self-pay | Admitting: Internal Medicine

## 2018-07-06 DIAGNOSIS — G894 Chronic pain syndrome: Secondary | ICD-10-CM

## 2018-07-06 NOTE — Telephone Encounter (Signed)
Done erx 

## 2018-07-08 ENCOUNTER — Ambulatory Visit: Payer: 59 | Admitting: Psychology

## 2018-07-22 ENCOUNTER — Ambulatory Visit: Payer: 59 | Admitting: Psychology

## 2018-07-25 ENCOUNTER — Ambulatory Visit: Payer: Managed Care, Other (non HMO) | Admitting: Podiatry

## 2018-07-25 ENCOUNTER — Other Ambulatory Visit: Payer: Self-pay

## 2018-07-25 ENCOUNTER — Encounter: Payer: Self-pay | Admitting: Podiatry

## 2018-07-25 VITALS — Temp 97.3°F

## 2018-07-25 DIAGNOSIS — M79675 Pain in left toe(s): Secondary | ICD-10-CM | POA: Diagnosis not present

## 2018-07-25 DIAGNOSIS — M79674 Pain in right toe(s): Secondary | ICD-10-CM | POA: Diagnosis not present

## 2018-07-25 DIAGNOSIS — Z79899 Other long term (current) drug therapy: Secondary | ICD-10-CM | POA: Diagnosis not present

## 2018-07-25 DIAGNOSIS — B351 Tinea unguium: Secondary | ICD-10-CM | POA: Diagnosis not present

## 2018-07-25 MED ORDER — TERBINAFINE HCL 250 MG PO TABS: 250.0000 mg | ORAL_TABLET | Freq: Every day | ORAL | 0 refills | Status: DC

## 2018-07-25 NOTE — Progress Notes (Signed)
Subjective:   Patient ID: Larry Riley, male   DOB: 38 y.o.   MRN: [id number]   HPI Patient states that all of my nails have become very thickened again and I know that I need medication.  Patient states he has not yet taken a full 90-day dose   ROS      Objective:  Physical Exam  Neurovascular status intact with patient found to have thickness of nailbeds 1 through 5 both feet with yellow brittle type condition and also skin manifestations of disease     Assessment:  Significant mycotic nail infection 1-5 both feet     Plan:  Reviewed condition and recommended antifungal treatment and we will start him 90 days and then switch over to a post treatment in future.  Patient will be seen back for [place] to recheck and was given prescription for Lamisil today

## 2018-08-01 ENCOUNTER — Ambulatory Visit (INDEPENDENT_AMBULATORY_CARE_PROVIDER_SITE_OTHER): Payer: 59 | Admitting: Psychology

## 2018-08-01 DIAGNOSIS — F411 Generalized anxiety disorder: Secondary | ICD-10-CM | POA: Diagnosis not present

## 2018-08-24 ENCOUNTER — Ambulatory Visit: Payer: Managed Care, Other (non HMO) | Attending: Neurosurgery | Admitting: Physical Therapy

## 2018-08-24 ENCOUNTER — Other Ambulatory Visit: Payer: Self-pay

## 2018-08-24 DIAGNOSIS — M6281 Muscle weakness (generalized): Secondary | ICD-10-CM | POA: Diagnosis present

## 2018-08-24 DIAGNOSIS — M542 Cervicalgia: Secondary | ICD-10-CM | POA: Insufficient documentation

## 2018-08-24 DIAGNOSIS — R2689 Other abnormalities of gait and mobility: Secondary | ICD-10-CM | POA: Insufficient documentation

## 2018-08-24 DIAGNOSIS — R2681 Unsteadiness on feet: Secondary | ICD-10-CM | POA: Diagnosis present

## 2018-08-24 NOTE — Therapy (Signed)
[facility] [address] [place], [place], [address] Phone: [phone]   Fax:  [phone]  Physical Therapy Evaluation  Patient Details  Name: Larry Riley MRN: [id number] Date of Birth: October 08, 1980 Referring Provider (PT): Gaspar Cola, MD   Encounter Date: 08/24/2018  PT End of Session - 08/24/18 1001    Visit Number  1    Number of Visits  12    Date for PT Re-Evaluation  10/05/18    Authorization Type  cigna managed    PT Start Time  0850    PT Stop Time  0935    PT Time Calculation (min)  45 min    Activity Tolerance  Patient tolerated treatment well    Behavior During Therapy  [facility] for tasks assessed/performed       Past Medical History:  Diagnosis Date  . GERD (gastroesophageal reflux disease) 01/21/2017  . MRSA (methicillin resistant Staphylococcus aureus) 2005   leg  . Neurofibromatosis    tumor down spine and brain    Past Surgical History:  Procedure Laterality Date  . hydrocephalus     [place]unt 1999[place]  . NECK SURGERY      tumor removal  . SPINE SURGERY  10/2013   c2-t2 fusion  , laminectomy c1-6-- Dr Rigoberto Noel-- [place]  . VASECTOMY  02/2013    There were no vitals filed for this visit.   Subjective Assessment - 08/24/18 0856    Subjective  He is familiar to this clinic and is referred back for ongoing neck and shoulder pain, gait and strength due to multiple surgeries and neurofibromatosis type 1. Pt relays his pain is about 3-6 resting and worse gets to 7-9 in his neck and shoulder, aggravated by sitting to long, laying wrong, standing for long periods of time. He also has leg weakness and Lt foot drop as well and wants to work on strengthening for his legs and walking. He is also looking to get functional E-stim for his Lt foot drop.    Pertinent History  PMH: s/p multiple spinal decompression surgeries for schwanomatosis.(2015, 2018)    How long can you sit comfortably?  depends    How long  can you stand comfortably?  20 min    How long can you walk comfortably?  30 min with brace    Diagnostic tests  CT and MRI    Patient Stated Goals  gain strength, decreased pain, better gait, get functional e stim (has new insurance so hopes to get this approaved)    Currently in Pain?  Yes    Pain Score  --   see above for ratings   Pain Type  Chronic pain    Pain Onset  More than a month ago    Pain Frequency  Constant    Aggravating Factors   sleeping on side, laying flat for a a few minutes         [facility] PT Assessment - 08/24/18 0001      Assessment   Medical Diagnosis  neurofibromatosis type 1, neck and shoulder pain    Referring Provider (PT)  Gaspar Cola, MD    Hand Dominance  Right    Next MD Visit  2 years    Prior Therapy  prior neuro rehab      Precautions   Precautions  None      Balance Screen   Has the patient fallen in the past 6 months  Yes    How many times?  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    Leisure  walking dogs      Cognition   Overall Cognitive Status  Within Functional Limits for tasks assessed      Observation/Other Assessments   Focus on Therapeutic Outcomes (FOTO)   NA      ROM / Strength   AROM / PROM / Strength  Strength;AROM      AROM   Overall AROM Comments  shoulder ROM WFL    AROM Assessment Site  Cervical    Cervical Flexion  10%    Cervical Extension  10%    Cervical - Right Side Bend  10%    Cervical - Left Side Bend  10%    Cervical - Right Rotation  25%    Cervical - Left Rotation  25%      Strength   Overall Strength Comments  Lt shoulder strength grossly 4/5 MMT compared to 5/5 on Rt, Lt elbow 5/5, Lt grip 4/5, for LE  Rt leg 5/5 groslsy, Lt hip 4/5 hip flexion and knee flexion, 5/5 for knee ext and knee abd, for Lt  ankle 2/5 DF, 3/5 PF, 3/5 INV and EV      Transfers   Five time sit to stand comments   12 sec no hands standard chair      Ambulation/Gait   Gait Comments  independent with gait no AD, (did not have AFO brace on but normally wears one) Lt foot drop so hip hike compensaton, steady on his feet, mild decreased in velocity      Balance   Balance Assessed  Yes      Standardized Balance Assessment   Standardized Balance Assessment  Timed Up and Go Test      Timed Up and Go Test   Normal TUG (seconds)  11                Objective measurements completed on examination: See above findings.              PT Education - 08/24/18 1000    Education Details  HEP, POC    Person(s) Educated  Patient    Methods  Explanation;Demonstration;Verbal cues;Handout    Comprehension  Verbalized understanding;Need further instruction          PT Long Term Goals - 08/24/18 1130      PT LONG TERM GOAL #1   Title  improve Lt shoulder strength to 4+/5 and Lt hip flexion to 4+/5, Lt ankle strength at least a half muscle grade.    Time  6    Period  Weeks    Status  New      PT LONG TERM GOAL #2   Title  Independent with HEP for LLE strengthening and cervical ROM/stretching    Time  6    Period  Days    Status  New      PT LONG TERM GOAL #3   Title  improve neck ROM by 10% or more each plane    Time  6    Period  Weeks    Status  New      PT LONG TERM GOAL #4   Title  Improve 5x  sit to stand score from 12.10 secs to </= 10 secs without UE support from standard chair.    Time  6    Period  Weeks      PT LONG TERM GOAL #5   Title  Assess use and benefit of Bioness unit for LLE if pt is determined to be appropriate candidate for use of this modality.    Time  6    Period  Weeks    Status  New             Plan - 08/24/18 1002    Clinical Impression Statement  Pt referred back to neuro rehab by MD for neurofibromatosis type (s/p multiple spinal decompression  surgeries for schwanomatosis.(2015, 2018)), neck and shoulder pain. He also has difficulty with Lt leg weakness, drop foot and gait. He would benefit from skilled PT to address these defficits. He would benefit from funcitonal Estim to improve safety as he catches his foot at times causing him to almost fall. He may also benefit from aquatic PT.    Personal Factors and Comorbidities  Comorbidity 1;Comorbidity 2;Comorbidity 3+    Comorbidities  PMH: s/p multiple spinal decompression surgeries for schwanomatosis.(2015, 2018)    Examination-Activity Limitations  Locomotion Level;Bend;Reach Overhead;Carry;Dressing    Stability/Clinical Decision Making  Evolving/Moderate complexity    Clinical Decision Making  Moderate    Rehab Potential  Good    PT Frequency  2x / week    PT Duration  6 weeks    PT Treatment/Interventions  Electrical Stimulation;Aquatic Therapy;Moist Heat;Ultrasound;Gait training;Functional mobility training;Therapeutic activities;Therapeutic exercise;Balance training;Neuromuscular re-education;Manual techniques;Passive range of motion;Taping    PT Next Visit Plan  needs neck stretching and strengthening, Lt arm strength, left hip and ankle strength, wants to try to get approaval for functional estim now that he has new insurance    Recommended Other Services  possibly OT (he was incouraged to get referral from his MD for this), possibly aquatic       Patient will benefit from skilled therapeutic intervention in order to improve the following deficits and impairments:  Abnormal gait, Decreased activity tolerance, Decreased endurance, Decreased balance, Decreased mobility, Decreased range of motion, Difficulty walking, Hypomobility, Postural dysfunction, Pain, Impaired UE functional use, Improper body mechanics  Visit Diagnosis: 1. Muscle weakness (generalized)   2. Other abnormalities of gait and mobility   3. Unsteadiness on feet        Problem List Patient Active Problem  List   Diagnosis Date Noted  . Acute sinus infection 03/09/2017  . External otitis of right ear 03/09/2017  . Thumb laceration, right, initial encounter 03/09/2017  . GERD (gastroesophageal reflux disease) 01/21/2017  . Weakness of left side of body 11/19/2016  . Stress due to illness of family member 06/10/2016  . Syncope 04/23/2016  . Onychomycosis 04/23/2016  . Impacted ear wax 04/23/2016  . Constipation due to opioid therapy 06/13/2014  . Chronic pain syndrome 02/21/2014  . Allergic reaction 02/20/2014  . Neurofibromatosis, type 1 (HCC) 12/14/2013  . Status post cervical arthrodesis 11/21/2013  . Cervical myelopathy (HCC) 09/21/2013  . Preventative health care 06/23/2010  . HORNER'S SYNDROME 04/12/2009  . Other psoriasis 08/24/2007  . Tinea pedis 08/24/2007    Birdie RiddleBrian R Domino Holten,PT,DPT 08/24/2018, 11:45 AM  [facility] [address] [place], [place], [address] Phone: [phone]   Fax:  [phone]  Name: Sandrea HammondJoshua Gaultney MRN: [id number] Date of Birth: October 05, 1980

## 2018-08-24 NOTE — Patient Instructions (Signed)
Access Code: [email or web address]  URL: [email or web address]  Date: 08/24/2018  Prepared by: [name]   Exercises  Seated Isometric Cervical Rotation - 10 reps - 3 sets - 2x daily - 6x weekly  Seated Assisted Cervical Rotation with Towel - 10 reps - 1-2 sets - 2x daily - 6x weekly  Seated Cervical Sidebending Stretch - 3 sets - 30 sec hold - 2x daily - 6x weekly  Seated Isometric Cervical Extension - 10 reps - 1 sets - 6 hold - 2x daily - 6x weekly  Seated Isometric Cervical Sidebending - 10 reps - 1 sets - 2x daily - 6x weekly  Supine Bridge - 10 reps - 1-2 sets - 5 hold - 2x daily - 6x weekly  Seated Cervical Retraction - 10 reps - 2-3 sets - 2x daily - 6x weekly  Standing Shoulder Flexion with Dumbbells - 10 reps - 1-2 sets - 2x daily - 6x weekly  Shoulder Abduction with Dumbbells - Thumbs Up - 10 reps - 1-2 sets - 2x daily - 6x weekly  Supine Active Straight Leg Raise - 10 reps - 1-3 sets - 2x daily - 6x weekly  Sit to Stand without Arm Support - 10 reps - 1-2 sets - 2x daily - 6x weekly  Quadruped Alternating Arm Lift - 10 reps - 1-2 sets - 2x daily - 6x weekly  Quadruped Alternating Leg Extensions - 10 reps - 1-2 sets - 2x daily - 6x weekly  Isometric Ankle Eversion at Wall - 10 reps - 1 sets - 6 hold - 2x daily - 6x weekly  Isometric Ankle Inversion - 10 reps - 1 sets - 2x daily - 6x weekly  Isometric Ankle Dorsiflexion and Plantarflexion - 10 reps - 1 sets - 6 hold - 2x daily - 6x weekly  Seated Eccentric Ankle Plantar Flexion with Resistance - Straight Leg - 10 reps - 3 sets - 2x daily - 6x weekly

## 2018-08-29 ENCOUNTER — Ambulatory Visit: Payer: Managed Care, Other (non HMO) | Admitting: Physical Therapy

## 2018-08-29 ENCOUNTER — Other Ambulatory Visit: Payer: Self-pay

## 2018-08-29 DIAGNOSIS — M6281 Muscle weakness (generalized): Secondary | ICD-10-CM

## 2018-08-29 DIAGNOSIS — R2689 Other abnormalities of gait and mobility: Secondary | ICD-10-CM

## 2018-08-29 DIAGNOSIS — R2681 Unsteadiness on feet: Secondary | ICD-10-CM

## 2018-08-29 DIAGNOSIS — M542 Cervicalgia: Secondary | ICD-10-CM

## 2018-08-29 NOTE — Therapy (Signed)
[facility] [address] [place], [place], [address] Phone: [phone]   Fax:  [phone]  Physical Therapy Treatment  Patient Details  Name: Larry HammondJoshua Riley MRN: [id number] Date of Birth: 02-Sep-1980 Referring Provider (PT): Mindi JunkerHsu, [place], MD   Encounter Date: 08/29/2018  PT End of Session - 08/29/18 1834    Visit Number  2    Number of Visits  12    Date for PT Re-Evaluation  10/05/18    Authorization Type  cigna managed    PT Start Time  1745    PT Stop Time  1830    PT Time Calculation (min)  45 min    Activity Tolerance  Patient tolerated treatment well    Behavior During Therapy  [facility] for tasks assessed/performed       Past Medical History:  Diagnosis Date  . GERD (gastroesophageal reflux disease) 01/21/2017  . MRSA (methicillin resistant Staphylococcus aureus) 2005   leg  . Neurofibromatosis    tumor down spine and brain    Past Surgical History:  Procedure Laterality Date  . hydrocephalus     [place]unt 1999[place]  . NECK SURGERY      tumor removal  . SPINE SURGERY  10/2013   c2-t2 fusion  , laminectomy c1-6-- Dr Raynald KempHSU-- [place]  . VASECTOMY  02/2013    There were no vitals filed for this visit.  Subjective Assessment - 08/29/18 1832    Subjective  Relays some compliance with HEP, a little less pain today 5/10 overall    Pertinent History  PMH: s/p multiple spinal decompression surgeries for schwanomatosis.(2015, 2018)    How long can you sit comfortably?  depends    How long can you stand comfortably?  20 min    How long can you walk comfortably?  30 min with brace    Diagnostic tests  CT and MRI    Patient Stated Goals  gain strength, decreased pain, better gait, get functional e stim (has new insurance so hopes to get this approaved)    Pain Onset  More than a month ago                       [facility] Treatment/Exercise - 08/29/18 0001      Exercises   Exercises  Other  Exercises    Other Exercises   walked 3 laps 330 ft with AFO for warm up. supine cervical retraction X 15, supine H abd with red band X15, SLR Lt hip flexion 2X10, ankle INV and PF yellow X 15, then DF and EV no resistance X 15, step ups Lt leg X 15      Manual Therapy   Manual therapy comments  PROM to neck all planes, S.O release                  PT Long Term Goals - 08/24/18 1130      PT LONG TERM GOAL #1   Title  improve Lt shoulder strength to 4+/5 and Lt hip flexion to 4+/5, Lt ankle strength at least a half muscle grade.    Time  6    Period  Weeks    Status  New      PT LONG TERM GOAL #2   Title  Independent with HEP for LLE strengthening and cervical ROM/stretching    Time  6    Period  Days    Status  New  PT LONG TERM GOAL #3   Title  improve neck ROM by 10% or more each plane    Time  6    Period  Weeks    Status  New      PT LONG TERM GOAL #4   Title  Improve 5x sit to stand score from 12.10 secs to </= 10 secs without UE support from standard chair.    Time  6    Period  Weeks      PT LONG TERM GOAL #5   Title  Assess use and benefit of Bioness unit for LLE if pt is determined to be appropriate candidate for use of this modality.    Time  6    Period  Weeks    Status  New            Plan - 08/29/18 1834    Clinical Impression Statement  Session focused on neck ROM, manual therapy for neck, gait and Lt LE strengthening to tolerance. He willl continue to benefit from PT to address these areas       Patient will benefit from skilled therapeutic intervention in order to improve the following deficits and impairments:     Visit Diagnosis: 1. Muscle weakness (generalized)   2. Other abnormalities of gait and mobility   3. Unsteadiness on feet   4. Cervicalgia        Problem List Patient Active Problem List   Diagnosis Date Noted  . Acute sinus infection 03/09/2017  . External otitis of right ear 03/09/2017  . Thumb laceration,  right, initial encounter 03/09/2017  . GERD (gastroesophageal reflux disease) 01/21/2017  . Weakness of left side of body 11/19/2016  . Stress due to illness of family member 06/10/2016  . Syncope 04/23/2016  . Onychomycosis 04/23/2016  . Impacted ear wax 04/23/2016  . Constipation due to opioid therapy 06/13/2014  . Chronic pain syndrome 02/21/2014  . Allergic reaction 02/20/2014  . Neurofibromatosis, type 1 ([place]) 12/14/2013  . Status post cervical arthrodesis 11/21/2013  . Cervical myelopathy ([place]) 09/21/2013  . Preventative health care 06/23/2010  . HORNER'S SYNDROME 04/12/2009  . Other psoriasis 08/24/2007  . Tinea pedis 08/24/2007    Silvestre Mesi 08/29/2018, 6:39 PM  [facility] [address] [place], [place], [address] Phone: [phone]   Fax:  [phone]  Name: Larry Riley MRN: [id number] Date of Birth: 05-18-1980

## 2018-08-31 ENCOUNTER — Other Ambulatory Visit: Payer: Self-pay

## 2018-08-31 ENCOUNTER — Ambulatory Visit: Payer: Managed Care, Other (non HMO) | Admitting: Physical Therapy

## 2018-08-31 DIAGNOSIS — M542 Cervicalgia: Secondary | ICD-10-CM

## 2018-08-31 DIAGNOSIS — R2689 Other abnormalities of gait and mobility: Secondary | ICD-10-CM

## 2018-08-31 DIAGNOSIS — R2681 Unsteadiness on feet: Secondary | ICD-10-CM

## 2018-08-31 DIAGNOSIS — M6281 Muscle weakness (generalized): Secondary | ICD-10-CM

## 2018-08-31 NOTE — Therapy (Signed)
[facility] [address] [place], [place], [address] Phone: [phone]   Fax:  [phone]  Physical Therapy Treatment  Patient Details  Name: Larry Riley MRN: [id number] Date of Birth: 03-05-1980 Referring Provider (PT): Gaspar Cola, MD   Encounter Date: 08/31/2018  PT End of Session - 08/31/18 1803    Visit Number  3    Number of Visits  12    Date for PT Re-Evaluation  10/05/18    Authorization Type  cigna managed    PT Start Time  1700    PT Stop Time  1745    PT Time Calculation (min)  45 min    Activity Tolerance  Patient tolerated treatment well    Behavior During Therapy  [facility] for tasks assessed/performed       Past Medical History:  Diagnosis Date  . GERD (gastroesophageal reflux disease) 01/21/2017  . MRSA (methicillin resistant Staphylococcus aureus) 2005   leg  . Neurofibromatosis    tumor down spine and brain    Past Surgical History:  Procedure Laterality Date  . hydrocephalus     [place]unt 1999[place]  . NECK SURGERY      tumor removal  . SPINE SURGERY  10/2013   c2-t2 fusion  , laminectomy c1-6-- Dr Rigoberto Noel-- [place]  . VASECTOMY  02/2013    There were no vitals filed for this visit.  Subjective Assessment - 08/31/18 1801    Subjective  Relays has been working on his exericses, less neck stiffness, pain is overall better but does not rate. He is having some carpal tunnel like symptoms from working from home.    Pertinent History  PMH: s/p multiple spinal decompression surgeries for schwanomatosis.(2015, 2018)    Patient Stated Goals  gain strength, decreased pain, better gait, get functional e stim (has new insurance so hopes to get this approaved)                       [facility] Adult PT Treatment/Exercise - 08/31/18 0001      Manual Therapy   Manual therapy comments  PROM to neck all planes, S.O release      Nu step L5  X5 min UE/LE,  SLR with circles at the top  x10 CW,  CCW ankle 4 way with manual resistance X 15 each Leg press 80 lbs bilat 2X10, dropped down to 50 lbs for Lt only 2X10 Step ups Lt leg fwd and lateral  X10 ea on 6 inch step   PT Education - 08/31/18 1802    Education Details  provided recommendation for carpal tunnel brace    Person(s) Educated  Patient    Methods  Explanation;Handout    Comprehension  Verbalized understanding          PT Long Term Goals - 08/24/18 1130      PT LONG TERM GOAL #1   Title  improve Lt shoulder strength to 4+/5 and Lt hip flexion to 4+/5, Lt ankle strength at least a half muscle grade.    Time  6    Period  Weeks    Status  New      PT LONG TERM GOAL #2   Title  Independent with HEP for LLE strengthening and cervical ROM/stretching    Time  6    Period  Days    Status  New      PT LONG TERM GOAL #3   Title  improve neck ROM by  10% or more each plane    Time  6    Period  Weeks    Status  New      PT LONG TERM GOAL #4   Title  Improve 5x sit to stand score from 12.10 secs to </= 10 secs without UE support from standard chair.    Time  6    Period  Weeks      PT LONG TERM GOAL #5   Title  Assess use and benefit of Bioness unit for LLE if pt is determined to be appropriate candidate for use of this modality.    Time  6    Period  Weeks    Status  New            Plan - 08/31/18 1803    Clinical Impression Statement  Pt progressed with leg strength today with good tolerance. He was recommended to trial carpal tunnel splint and if this does not help to seek referral to OT. He continues to show good effort with PT    Personal Factors and Comorbidities  Comorbidity 1;Comorbidity 2;Comorbidity 3+    Comorbidities  PMH: s/p multiple spinal decompression surgeries for schwanomatosis.(2015, 2018)    Examination-Activity Limitations  Locomotion Level;Bend;Reach Overhead;Carry;Dressing    Stability/Clinical Decision Making  Evolving/Moderate complexity    Rehab Potential  Good    PT  Frequency  2x / week    PT Duration  6 weeks    PT Treatment/Interventions  Electrical Stimulation;Aquatic Therapy;Moist Heat;Ultrasound;Gait training;Functional mobility training;Therapeutic activities;Therapeutic exercise;Balance training;Neuromuscular re-education;Manual techniques;Passive range of motion;Taping    PT Next Visit Plan  needs neck stretching and strengthening, Lt arm strength, left hip and ankle strength, wants to try to get approaval for functional estim now that he has new insurance       Patient will benefit from skilled therapeutic intervention in order to improve the following deficits and impairments:  Abnormal gait, Decreased activity tolerance, Decreased endurance, Decreased balance, Decreased mobility, Decreased range of motion, Difficulty walking, Hypomobility, Postural dysfunction, Pain, Impaired UE functional use, Improper body mechanics  Visit Diagnosis: 1. Muscle weakness (generalized)   2. Other abnormalities of gait and mobility   3. Unsteadiness on feet   4. Cervicalgia        Problem List Patient Active Problem List   Diagnosis Date Noted  . Acute sinus infection 03/09/2017  . External otitis of right ear 03/09/2017  . Thumb laceration, right, initial encounter 03/09/2017  . GERD (gastroesophageal reflux disease) 01/21/2017  . Weakness of left side of body 11/19/2016  . Stress due to illness of family member 06/10/2016  . Syncope 04/23/2016  . Onychomycosis 04/23/2016  . Impacted ear wax 04/23/2016  . Constipation due to opioid therapy 06/13/2014  . Chronic pain syndrome 02/21/2014  . Allergic reaction 02/20/2014  . Neurofibromatosis, type 1 (HCC) 12/14/2013  . Status post cervical arthrodesis 11/21/2013  . Cervical myelopathy (HCC) 09/21/2013  . Preventative health care 06/23/2010  . HORNER'S SYNDROME 04/12/2009  . Other psoriasis 08/24/2007  . Tinea pedis 08/24/2007    Birdie RiddleBrian R Lue [place],PT,DPT 08/31/2018, 6:23 PM  [facility] [address] [place], [place], [address] Phone: [phone]   Fax:  [phone]  Name: Larry Riley MRN: [id number] Date of Birth: 08/05/1980

## 2018-09-05 ENCOUNTER — Other Ambulatory Visit: Payer: Self-pay

## 2018-09-05 ENCOUNTER — Ambulatory Visit: Payer: Managed Care, Other (non HMO) | Admitting: Physical Therapy

## 2018-09-05 DIAGNOSIS — M542 Cervicalgia: Secondary | ICD-10-CM

## 2018-09-05 DIAGNOSIS — M6281 Muscle weakness (generalized): Secondary | ICD-10-CM | POA: Diagnosis not present

## 2018-09-05 DIAGNOSIS — R2681 Unsteadiness on feet: Secondary | ICD-10-CM

## 2018-09-05 DIAGNOSIS — R2689 Other abnormalities of gait and mobility: Secondary | ICD-10-CM

## 2018-09-06 NOTE — Therapy (Signed)
[facility] [address] [place], [place], [address] Phone: [phone]   Fax:  [phone]  Physical Therapy Treatment  Patient Details  Name: Larry Riley MRN: [id number] Date of Birth: 09-07-1980 Referring Provider (PT): Larry Cola, MD   Encounter Date: 09/05/2018  PT End of Session - 09/06/18 0800    Visit Number  4    Number of Visits  12    Date for PT Re-Evaluation  10/05/18    Authorization Type  [employer] managed    PT Start Time  1700    PT Stop Time  1745    PT Time Calculation (min)  45 min    Activity Tolerance  Patient tolerated treatment well    Behavior During Therapy  [facility] for tasks assessed/performed       Past Medical History:  Diagnosis Date  . GERD (gastroesophageal reflux disease) 01/21/2017  . MRSA (methicillin resistant Staphylococcus aureus) 2005   leg  . Neurofibromatosis    tumor down spine and brain    Past Surgical History:  Procedure Laterality Date  . hydrocephalus     [place]unt 1999[place]  . NECK SURGERY      tumor removal  . SPINE SURGERY  10/2013   c2-t2 fusion  , laminectomy c1-6-- Dr Larry Riley-- [place]  . VASECTOMY  02/2013    There were no vitals filed for this visit.  Subjective Assessment - 09/06/18 0759    Subjective  the wrist carpal tunnel brace is helping, overall some less pain 5/10 overall    Pertinent History  PMH: s/p multiple spinal decompression surgeries for schwanomatosis.(2015, 2018)    How long can you sit comfortably?  depends    How long can you stand comfortably?  20 min    How long can you walk comfortably?  30 min with brace    Diagnostic tests  CT and MRI    Patient Stated Goals  gain strength, decreased pain, better gait, get functional e stim (has new insurance so hopes to get this approaved)    Pain Onset  More than a month ago      Neurorehab:  Nu step L6 for 2 min then dropped to L5 for last 3 min (5 min total)UE/LE supine neck ROM/strength  for sidebending with manual resistance 5 sec X 10 each side, cervical retractions X 20 Manual therapy for neck PROM all planes and S.O relase supine SLR hip flexion 2X 10 bilat supine SL bridge  X10 each leg sitting ankle 4 way with manual resistance X 15 each Leg press 80 lbs bilat X 20, dropped down to 50 lbs for Lt only 2X10 heel toe raises standing X 20 Rocker board X 20 for  Step ups Lt leg fwd and lateral  X10 ea on 6 inch step    PT Long Term Goals - 08/24/18 1130      PT LONG TERM GOAL #1   Title  improve Lt shoulder strength to 4+/5 and Lt hip flexion to 4+/5, Lt ankle strength at least a half muscle grade.    Time  6    Period  Weeks    Status  New      PT LONG TERM GOAL #2   Title  Independent with HEP for LLE strengthening and cervical ROM/stretching    Time  6    Period  Days    Status  New      PT LONG TERM GOAL #3   Title  improve  neck ROM by 10% or more each plane    Time  6    Period  Weeks    Status  New      PT LONG TERM GOAL #4   Title  Improve 5x sit to stand score from 12.10 secs to </= 10 secs without UE support from standard chair.    Time  6    Period  Weeks      PT LONG TERM GOAL #5   Title  Assess use and benefit of Bioness unit for LLE if pt is determined to be appropriate candidate for use of this modality.    Time  6    Period  Weeks    Status  New            Plan - 09/06/18 0800    Clinical Impression Statement  Progresssed strength today, focused more on ankle strength and dorsiflexion activation by adding in standing heel toe raises, and rocker board. Continue POC    Personal Factors and Comorbidities  Comorbidity 1;Comorbidity 2;Comorbidity 3+    Comorbidities  PMH: s/p multiple spinal decompression surgeries for schwanomatosis.(2015, 2018)    Examination-Activity Limitations  Locomotion Level;Bend;Reach Overhead;Carry;Dressing    Stability/Clinical Decision Making  Evolving/Moderate complexity    Rehab Potential  Good    PT  Frequency  2x / week    PT Duration  6 weeks    PT Treatment/Interventions  Electrical Stimulation;Aquatic Therapy;Moist Heat;Ultrasound;Gait training;Functional mobility training;Therapeutic activities;Therapeutic exercise;Balance training;Neuromuscular re-education;Manual techniques;Passive range of motion;Taping    PT Next Visit Plan  needs neck stretching and strengthening, Lt arm strength, left hip and ankle strength, wants to try to get approaval for functional estim now that he has new insurance       Patient will benefit from skilled therapeutic intervention in order to improve the following deficits and impairments:  Abnormal gait, Decreased activity tolerance, Decreased endurance, Decreased balance, Decreased mobility, Decreased range of motion, Difficulty walking, Hypomobility, Postural dysfunction, Pain, Impaired UE functional use, Improper body mechanics  Visit Diagnosis: Muscle weakness (generalized)  Other abnormalities of gait and mobility  Unsteadiness on feet  Cervicalgia     Problem List Patient Active Problem List   Diagnosis Date Noted  . Acute sinus infection 03/09/2017  . External otitis of right ear 03/09/2017  . Thumb laceration, right, initial encounter 03/09/2017  . GERD (gastroesophageal reflux disease) 01/21/2017  . Weakness of left side of body 11/19/2016  . Stress due to illness of family member 06/10/2016  . Syncope 04/23/2016  . Onychomycosis 04/23/2016  . Impacted ear wax 04/23/2016  . Constipation due to opioid therapy 06/13/2014  . Chronic pain syndrome 02/21/2014  . Allergic reaction 02/20/2014  . Neurofibromatosis, type 1 (HCC) 12/14/2013  . Status post cervical arthrodesis 11/21/2013  . Cervical myelopathy (HCC) 09/21/2013  . Preventative health care 06/23/2010  . HORNER'S SYNDROME 04/12/2009  . Other psoriasis 08/24/2007  . Tinea pedis 08/24/2007    Larry Riley ,PT,DPT 09/06/2018, 8:02 AM  [facility] [address] [place], [place], [address] Phone: [phone]   Fax:  [phone]  Name: Larry Riley MRN: [id number] Date of Birth: [birth year]

## 2018-09-07 ENCOUNTER — Other Ambulatory Visit: Payer: Self-pay

## 2018-09-07 ENCOUNTER — Ambulatory Visit: Payer: Managed Care, Other (non HMO) | Admitting: Physical Therapy

## 2018-09-07 DIAGNOSIS — M6281 Muscle weakness (generalized): Secondary | ICD-10-CM

## 2018-09-07 DIAGNOSIS — R2681 Unsteadiness on feet: Secondary | ICD-10-CM

## 2018-09-07 DIAGNOSIS — R2689 Other abnormalities of gait and mobility: Secondary | ICD-10-CM

## 2018-09-07 DIAGNOSIS — M542 Cervicalgia: Secondary | ICD-10-CM

## 2018-09-07 NOTE — Therapy (Signed)
[facility] [address] [place], [place], [address] Phone: [phone]   Fax:  [phone]  Physical Therapy Treatment  Patient Details  Name: Larry HammondJoshua Riley MRN: [id number] Date of Birth: 07/31/1980 Referring Provider (PT): Mindi JunkerHsu, [place], MD   Encounter Date: 09/07/2018  PT End of Session - 09/07/18 1706    Visit Number  5    Number of Visits  12    Date for PT Re-Evaluation  10/05/18    Authorization Type  [employer]ign[employer] managed    PT Start Time  1615    PT Stop Time  1710    PT Time Calculation (min)  55 min    Activity Tolerance  Patient tolerated treatment well    Behavior During Therapy  [facility] for tasks assessed/performed       Past Medical History:  Diagnosis Date  . GERD (gastroesophageal reflux disease) 01/21/2017  . MRSA (methicillin resistant Staphylococcus aureus) 2005   leg  . Neurofibromatosis    tumor down spine and brain    Past Surgical History:  Procedure Laterality Date  . hydrocephalus     [place]unt 1999[place]  . NECK SURGERY      tumor removal  . SPINE SURGERY  10/2013   c2-t2 fusion  , laminectomy c1-6-- Dr Raynald KempHSU-- [place]  . VASECTOMY  02/2013    There were no vitals filed for this visit.  Subjective Assessment - 09/07/18 1705    Subjective  Overall I think I am improving with strength, pain, and tightness, my wife says I am walking better    Pertinent History  PMH: s/p multiple spinal decompression surgeries for schwanomatosis.(2015, 2018)    How long can you sit comfortably?  depends    How long can you stand comfortably?  20 min    How long can you walk comfortably?  30 min with brace    Diagnostic tests  CT and MRI    Patient Stated Goals  gain strength, decreased pain, better gait, get functional e stim (has new insurance so hopes to get this approaved)    Currently in Pain?  Yes    Pain Score  3     Pain Location  Neck    Pain Type  Chronic pain    Pain Onset  More than a month ago       Neurorehab performed today  Nu step L5 for 5 min supine neck ROM for sidebending with manual resistance 5 sec X 10 each side, cervical retractions X 20 Manual therapy for neck PROM all planes and S.O relase supine SLR hip flexion cirlces X 10 bilat supine SL bridge  X10 each leg sitting ankle 4 way and standing ankle circles with rocker board Leg press 90 lbs bilat X 20, dropped down to 60 lbs for unilat only 2X10 each side heel raises off step  X15 Step ups bilat lateral  X15 ea on 6 inch step, then fwd onto 2nd step X 10 ea side     PT Long Term Goals - 08/24/18 1130      PT LONG TERM GOAL #1   Title  improve Lt shoulder strength to 4+/5 and Lt hip flexion to 4+/5, Lt ankle strength at least a half muscle grade.    Time  6    Period  Weeks    Status  New      PT LONG TERM GOAL #2   Title  Independent with HEP for LLE strengthening and cervical ROM/stretching  Time  6    Period  Days    Status  New      PT LONG TERM GOAL #3   Title  improve neck ROM by 10% or more each plane    Time  6    Period  Weeks    Status  New      PT LONG TERM GOAL #4   Title  Improve 5x sit to stand score from 12.10 secs to </= 10 secs without UE support from standard chair.    Time  6    Period  Weeks      PT LONG TERM GOAL #5   Title  Assess use and benefit of Bioness unit for LLE if pt is determined to be appropriate candidate for use of this modality.    Time  6    Period  Weeks    Status  New            Plan - 09/07/18 1708    Clinical Impression Statement  He appears to be improving with PT with strength, ROM, pain, and gait. He does still have deficits in these areas and will continue to benefit from PT. He remains highly motivated and had good tolerance to strength progression today.    Personal Factors and Comorbidities  Comorbidity 1;Comorbidity 2;Comorbidity 3+    Comorbidities  PMH: s/p multiple spinal decompression surgeries for schwanomatosis.(2015, 2018)     Examination-Activity Limitations  Locomotion Level;Bend;Reach Overhead;Carry;Dressing    Stability/Clinical Decision Making  Evolving/Moderate complexity    Rehab Potential  Good    PT Frequency  2x / week    PT Duration  6 weeks    PT Treatment/Interventions  Electrical Stimulation;Aquatic Therapy;Moist Heat;Ultrasound;Gait training;Functional mobility training;Therapeutic activities;Therapeutic exercise;Balance training;Neuromuscular re-education;Manual techniques;Passive range of motion;Taping    PT Next Visit Plan  needs neck stretching and strengthening, Lt arm strength, left hip and ankle strength, wants to try to get approaval for functional estim now that he has new insurance       Patient will benefit from skilled therapeutic intervention in order to improve the following deficits and impairments:  Abnormal gait, Decreased activity tolerance, Decreased endurance, Decreased balance, Decreased mobility, Decreased range of motion, Difficulty walking, Hypomobility, Postural dysfunction, Pain, Impaired UE functional use, Improper body mechanics  Visit Diagnosis: Muscle weakness (generalized)  Other abnormalities of gait and mobility  Unsteadiness on feet  Cervicalgia     Problem List Patient Active Problem List   Diagnosis Date Noted  . Acute sinus infection 03/09/2017  . External otitis of right ear 03/09/2017  . Thumb laceration, right, initial encounter 03/09/2017  . GERD (gastroesophageal reflux disease) 01/21/2017  . Weakness of left side of body 11/19/2016  . Stress due to illness of family member 06/10/2016  . Syncope 04/23/2016  . Onychomycosis 04/23/2016  . Impacted ear wax 04/23/2016  . Constipation due to opioid therapy 06/13/2014  . Chronic pain syndrome 02/21/2014  . Allergic reaction 02/20/2014  . Neurofibromatosis, type 1 ([place]) 12/14/2013  . Status post cervical arthrodesis 11/21/2013  . Cervical myelopathy ([place]) 09/21/2013  . Preventative health care  06/23/2010  . HORNER'S SYNDROME 04/12/2009  . Other psoriasis 08/24/2007  . Tinea pedis 08/24/2007    Silvestre Mesi 09/07/2018, 5:12 PM  [facility] [address] [place], [place], [address] Phone: [phone]   Fax:  [phone]  Name: Larry Riley MRN: [id number] Date of Birth: 02-28-1980

## 2018-09-12 ENCOUNTER — Other Ambulatory Visit: Payer: Self-pay

## 2018-09-12 ENCOUNTER — Ambulatory Visit: Payer: Managed Care, Other (non HMO) | Admitting: Physical Therapy

## 2018-09-12 DIAGNOSIS — M6281 Muscle weakness (generalized): Secondary | ICD-10-CM | POA: Diagnosis not present

## 2018-09-12 DIAGNOSIS — M542 Cervicalgia: Secondary | ICD-10-CM

## 2018-09-12 DIAGNOSIS — R2681 Unsteadiness on feet: Secondary | ICD-10-CM

## 2018-09-12 DIAGNOSIS — R2689 Other abnormalities of gait and mobility: Secondary | ICD-10-CM

## 2018-09-12 NOTE — Therapy (Signed)
[facility] [address] [place], [place], [address] Phone: [phone]   Fax:  [phone]  Physical Therapy Treatment  Patient Details  Name: Larry Riley MRN: [id number] Date of Birth: 01/22/1980 Referring Provider (PT): Mindi Junker, MD   Encounter Date: 09/12/2018  PT End of Session - 09/12/18 2259    Visit Number  6    Number of Visits  12    Date for PT Re-Evaluation  10/05/18    Authorization Type  cign[employer] managed    PT Start Time  1700    PT Stop Time  1748    PT Time Calculation (min)  48 min    Activity Tolerance  Patient tolerated treatment well    Behavior During Therapy  [facility] for tasks assessed/performed       Past Medical History:  Diagnosis Date  . GERD (gastroesophageal reflux disease) 01/21/2017  . MRSA (methicillin resistant Staphylococcus aureus) 2005   leg  . Neurofibromatosis    tumor down spine and brain    Past Surgical History:  Procedure Laterality Date  . hydrocephalus     [place]unt 1999[place]  . NECK SURGERY      tumor removal  . SPINE SURGERY  10/2013   c2-t2 fusion  , laminectomy c1-6-- Dr Raynald Kemp-- [place]  . VASECTOMY  02/2013    There were no vitals filed for this visit.  Subjective Assessment - 09/12/18 2256    Subjective  Pain levels in my neck are overall staying pretty low around 2/10. I also feel I am getting stronger.    Pertinent History  PMH: s/p multiple spinal decompression surgeries for schwanomatosis.(2015, 2018)    How long can you sit comfortably?  depends    How long can you stand comfortably?  20 min    How long can you walk comfortably?  30 min with brace    Diagnostic tests  CT and MRI    Patient Stated Goals  gain strength, decreased pain, better gait, get functional e stim (has new insurance so hopes to get this approaved)    Pain Onset  More than a month ago        Treatment/Interventions  Nu step L5 for 6 min (seat 9)  supine neck ROM for  sidebending with manual resistance 5 sec X 10 each side, cervical retractions X 20  Manual therapy for neck PROM all planes and S.O relase  supine SL bridge  X15 each leg (needed one rest break on his Left)  supine ankle 4 way on Left with manual resistance X 15  Leg press 100 lbs bilat X 20, dropped down to 60 lbs for Lt only 2X15  Rocker board for A-P and lateral for balance and ankle stability  Step ups Lt leg fwd and lateral  X15 ea on 6 inch step  Partial lunges in bars with bilat UE support X 10 bilat  PT Long Term Goals - 09/12/18 2302      PT LONG TERM GOAL #1   Title  improve Lt shoulder strength to 4+/5 and Lt hip flexion to 4+/5, Lt ankle strength at least a half muscle grade.    Time  6    Period  Weeks    Status  On-going      PT LONG TERM GOAL #2   Title  Independent with HEP for LLE strengthening and cervical ROM/stretching    Time  6    Period  Days    Status  On-going      PT LONG TERM GOAL #3   Title  improve neck ROM by 10% or more each plane    Time  6    Period  Weeks    Status  On-going      PT LONG TERM GOAL #4   Title  Improve 5x sit to stand score from 12.10 secs to </= 10 secs without UE support from standard chair.    Time  6    Period  Weeks    Status  On-going      PT LONG TERM GOAL #5   Title  Assess use and benefit of Bioness unit for LLE if pt is determined to be appropriate candidate for use of this modality.    Time  6    Period  Weeks    Status  On-going            Plan - 09/12/18 2305    Clinical Impression Statement  Progressed strengthening today by adding resistance to leg press, progressed bridges from double leg to single leg, and added in partial lunges in bars with bilat UE support. He has increased his neck ROM, decreased his overall pain, and is slowly progressing with strength. He will benefit from continued PT and he may benefit from further bioness assessment due to his Lt foot drop.    Personal Factors and  Comorbidities  Comorbidity 1;Comorbidity 2;Comorbidity 3+    Comorbidities  PMH: s/p multiple spinal decompression surgeries for schwanomatosis.(2015, 2018)    Examination-Activity Limitations  Locomotion Level;Bend;Reach Overhead;Carry;Dressing    Stability/Clinical Decision Making  Evolving/Moderate complexity    Rehab Potential  Good    PT Frequency  2x / week    PT Duration  6 weeks    PT Treatment/Interventions  Electrical Stimulation;Aquatic Therapy;Moist Heat;Ultrasound;Gait training;Functional mobility training;Therapeutic activities;Therapeutic exercise;Balance training;Neuromuscular re-education;Manual techniques;Passive range of motion;Taping    PT Next Visit Plan  needs neck stretching and strengthening, Lt arm strength, left hip and ankle strength, wants to try to get approaval for functional estim now that he has new insurance       Patient will benefit from skilled therapeutic intervention in order to improve the following deficits and impairments:  Abnormal gait, Decreased activity tolerance, Decreased endurance, Decreased balance, Decreased mobility, Decreased range of motion, Difficulty walking, Hypomobility, Postural dysfunction, Pain, Impaired UE functional use, Improper body mechanics  Visit Diagnosis: Cervicalgia  Muscle weakness (generalized)  Other abnormalities of gait and mobility  Unsteadiness on feet     Problem List Patient Active Problem List   Diagnosis Date Noted  . Acute sinus infection 03/09/2017  . External otitis of right ear 03/09/2017  . Thumb laceration, right, initial encounter 03/09/2017  . GERD (gastroesophageal reflux disease) 01/21/2017  . Weakness of left side of body 11/19/2016  . Stress due to illness of family member 06/10/2016  . Syncope 04/23/2016  . Onychomycosis 04/23/2016  . Impacted ear wax 04/23/2016  . Constipation due to opioid therapy 06/13/2014  . Chronic pain syndrome 02/21/2014  . Allergic reaction 02/20/2014  .  Neurofibromatosis, type 1 (HCC) 12/14/2013  . Status post cervical arthrodesis 11/21/2013  . Cervical myelopathy (HCC) 09/21/2013  . Preventative health care 06/23/2010  . HORNER'S SYNDROME 04/12/2009  . Other psoriasis 08/24/2007  . Tinea pedis 08/24/2007    Birdie RiddleBrian R Sharea Guinther,PT,DPT 09/12/2018, 11:08 PM  [facility] [address] [place], [place], [address] Phone: [phone]   Fax:  [phone]  Name:  Blaize Epple MRN: [id number] Date of Birth: [birth year]

## 2018-09-15 ENCOUNTER — Other Ambulatory Visit: Payer: Self-pay

## 2018-09-15 ENCOUNTER — Encounter: Payer: Self-pay | Admitting: Rehabilitative and Restorative Service Providers"

## 2018-09-15 ENCOUNTER — Ambulatory Visit
Payer: Managed Care, Other (non HMO) | Attending: Neurosurgery | Admitting: Rehabilitative and Restorative Service Providers"

## 2018-09-15 DIAGNOSIS — M6281 Muscle weakness (generalized): Secondary | ICD-10-CM | POA: Diagnosis not present

## 2018-09-15 DIAGNOSIS — R29898 Other symptoms and signs involving the musculoskeletal system: Secondary | ICD-10-CM | POA: Diagnosis present

## 2018-09-15 DIAGNOSIS — R2689 Other abnormalities of gait and mobility: Secondary | ICD-10-CM

## 2018-09-15 DIAGNOSIS — R293 Abnormal posture: Secondary | ICD-10-CM

## 2018-09-15 DIAGNOSIS — R2681 Unsteadiness on feet: Secondary | ICD-10-CM

## 2018-09-15 DIAGNOSIS — M542 Cervicalgia: Secondary | ICD-10-CM

## 2018-09-15 NOTE — Patient Instructions (Signed)
Access Code: [email or web address]  URL: [email or web address]  Date: 09/15/2018  Prepared by: Rudell Cobb   Exercises Seated Isometric Cervical Rotation - 10 reps - 3 sets - 2x daily - 6x weekly Seated Assisted Cervical Rotation with Towel - 10 reps - 1-2 sets - 2x daily - 6x weekly Seated Cervical Sidebending Stretch - 3 sets - 30 sec hold - 2x daily - 6x weekly Seated Isometric Cervical Extension - 10 reps - 1 sets - 6 hold - 2x daily - 6x weekly Seated Isometric Cervical Sidebending - 10 reps - 1 sets - 2x daily                            - 6x weekly Standing Shoulder Flexion with Dumbbells - 10 reps - 1-2 sets - 2x daily - 6x weekly Shoulder Abduction with Dumbbells - Thumbs Up - 10 reps - 1-2 sets - 2x daily - 6x weekly Sit to Stand without Arm Support - 10 reps - 1-2 sets - 2x daily - 6x weekly Supine Bridge - 10 reps - 1-2 sets - 5 hold - 2x daily - 6x weekly Supine Active Straight Leg Raise - 10 reps - 1-3 sets - 2x daily - 6x weekly Isometric Ankle Eversion at Wall - 10 reps - 1 sets - 6 hold - 2x daily - 6x weekly Prone on Elbows Reach - 10 reps - 1 sets - 1x daily - 6x weekly Isometric Ankle Inversion - 10 reps - 1 sets - 2x daily - 6x weekly Isometric Ankle Dorsiflexion and Plantarflexion - 10 reps - 1 sets - 6 hold - 2x daily - 6x weekly Seated Eccentric Ankle Plantar Flexion with Resistance - Straight Leg - 10 reps - 3 sets - 2x daily - 6x weekly

## 2018-09-15 NOTE — Therapy (Signed)
[facility] [address] [place], [place], [address] Phone: [phone]   Fax:  [phone]  Physical Therapy Treatment  Patient Details  Name: Larry HammondJoshua Riley MRN: [id number] Date of Birth: 1980/04/11 Referring Provider (PT): Larry JunkerHsu, [place], MD   Encounter Date: 09/15/2018  PT End of Session - 09/15/18 1930    Visit Number  7    Number of Visits  12    Date for PT Re-Evaluation  10/05/18    Authorization Type  cigna managed    PT Start Time  1706    PT Stop Time  1746    PT Time Calculation (min)  40 min    Activity Tolerance  Patient tolerated treatment well    Behavior During Therapy  [facility] for tasks assessed/performed       Past Medical History:  Diagnosis Date  . GERD (gastroesophageal reflux disease) 01/21/2017  . MRSA (methicillin resistant Staphylococcus aureus) 2005   leg  . Neurofibromatosis    tumor down spine and brain    Past Surgical History:  Procedure Laterality Date  . hydrocephalus     [place]unt 1999[place]  . NECK SURGERY      tumor removal  . SPINE SURGERY  10/2013   c2-t2 fusion  , laminectomy c1-6-- [facility]-- [place]  . VASECTOMY  02/2013    There were no vitals filed for this visit.  Subjective Assessment - 09/15/18 1704    Subjective  Patient remains in 4-5 up to 7/10 pain.  He feels like it gets worse as the day goes on.  He discussed that his work may be willing to cover a stand up desk if he has a letter of medical need.  He does not want to try Bioness here until we know if it would be approved.    Pertinent History  PMH: s/p multiple spinal decompression surgeries for schwanomatosis.(2015, 2018)    Patient Stated Goals  gain strength, decreased pain, better gait, get functional e stim (has new insurance so hopes to get this approaved)    Currently in Pain?  Yes   today is worse   Pain Score  6     Pain Location  Shoulder    Pain Orientation  Right;Left    Pain Descriptors / Indicators   Aching    Pain Type  Chronic pain    Pain Onset  More than a month ago    Pain Frequency  Constant    Aggravating Factors   varies in intensity                       [facility] Adult PT Treatment/Exercise - 09/15/18 1723      Self-Care   Self-Care  Other Self-Care Comments    Other Self-Care Comments   PT and patient discussed work from home set-up and how this may be contributing to increased pain.  He notes his chair height is lower than desk heigh leading to wrist discomfort.  We discussed wiht raising his chair, he may need to place a stool under his feet for support.  We discussed daily stretch breaks, walking at lunch and stretching at end of the work day.      Exercises   Exercises  Neck;Shoulder      Neck Exercises: Standing   Neck Retraction  5 reps    Neck Retraction Limitations  Performed in standing @ mirror to demonstrate technique.  The patient elevated shoulders.  Worked on scapular  depression      Neck Exercises: Seated   Neck Retraction  1 rep    Neck Retraction Limitations  had patient demonstrate chin tuck for HEP and he is elevating shoulders and using anterior musculature/ moved to standing @ mirror to view      Neck Exercises: Supine   Neck Retraction  5 reps    Neck Retraction Limitations  With manual resistance and tactile/verbal cues for lengthening through posterior aspect of neck.  Tactile cues to reduce anterior cervical contraction.    Shoulder ABduction  Right;Left;10 reps    Shoulder Abduction Weights (lbs)  green t-band    Shoulder Abduction Limitations  with tactile cues for wrist positioning and elbows at neutral    Other Supine Exercise  towel roll stretch thoracic self mobilizatoin x 2 minutes      Neck Exercises: Prone   Axial Exension  --    Axial Extension Limitations  --    Neck Retraction  5 reps    Neck Retraction Limitations  with manual resistance    Shoulder Extension  5 reps    Shoulder Extension Limitations  alternating  reaching x 5 reps      Neck Exercises: Stabilization   Stabilization  Attempted quadriped positioning for stabilization, however R wrist is painful.  D/c quadriped HEP at this time.      Manual Therapy   Manual Therapy  Soft tissue mobilization;Manual Traction    Manual therapy comments  With goal of pain reduction and muscle lengthening    Soft tissue mobilization  L scalenes, L upper trapezius    Manual Traction  gentle manual traction              PT Education - 09/15/18 1929    Education Details  Modified HEP to remove quadriped (hurts wrists) and added prone shoulder extension.  Plan to further consolidate.    Person(s) Educated  Patient    Methods  Explanation;Demonstration;Handout    Comprehension  Verbalized understanding;Returned demonstration          PT Long Term Goals - 09/12/18 2302      PT LONG TERM GOAL #1   Title  improve Lt shoulder strength to 4+/5 and Lt hip flexion to 4+/5, Lt ankle strength at least a half muscle grade.    Time  6    Period  Weeks    Status  On-going      PT LONG TERM GOAL #2   Title  Independent with HEP for LLE strengthening and cervical ROM/stretching    Time  6    Period  Days    Status  On-going      PT LONG TERM GOAL #3   Title  improve neck ROM by 10% or more each plane    Time  6    Period  Weeks    Status  On-going      PT LONG TERM GOAL #4   Title  Improve 5x sit to stand score from 12.10 secs to </= 10 secs without UE support from standard chair.    Time  6    Period  Weeks    Status  On-going      PT LONG TERM GOAL #5   Title  Assess use and benefit of Bioness unit for LLE if pt is determined to be appropriate candidate for use of this modality.    Time  6    Period  Weeks    Status  On-going  Plan - 09/15/18 2212    Clinical Impression Statement  The patient would like to check with [employer] rep on coverage before using in therapy.  He has increased pain in neck and shoulders as he works  at home.  PT recommends he bring in a picture of his work station for further assessment to help with setup.  Also modified quadriped activities in HEP moving to prone to reduce strain on his wrist.  PT to work towards LTGs for improved mobility and reduced pain level.    PT Frequency  2x / week    PT Duration  6 weeks    PT Treatment/Interventions  Electrical Stimulation;Aquatic Therapy;Moist Heat;Ultrasound;Gait training;Functional mobility training;Therapeutic activities;Therapeutic exercise;Balance training;Neuromuscular re-education;Manual techniques;Passive range of motion;Taping    PT Next Visit Plan  Focus on postural strengthening, L UE/LE strengthening, work station modifications, f/u with [employer] on his insurance coverage of bioness.    Consulted and Agree with Plan of Care  Patient       Patient will benefit from skilled therapeutic intervention in order to improve the following deficits and impairments:  Abnormal gait, Decreased activity tolerance, Decreased endurance, Decreased balance, Decreased mobility, Decreased range of motion, Difficulty walking, Hypomobility, Postural dysfunction, Pain, Impaired UE functional use, Improper body mechanics  Visit Diagnosis: Muscle weakness (generalized)  Cervicalgia  Other abnormalities of gait and mobility  Unsteadiness on feet  Other symptoms and signs involving the musculoskeletal system  Abnormal posture     Problem List Patient Active Problem List   Diagnosis Date Noted  . Acute sinus infection 03/09/2017  . External otitis of right ear 03/09/2017  . Thumb laceration, right, initial encounter 03/09/2017  . GERD (gastroesophageal reflux disease) 01/21/2017  . Weakness of left side of body 11/19/2016  . Stress due to illness of family member 06/10/2016  . Syncope 04/23/2016  . Onychomycosis 04/23/2016  . Impacted ear wax 04/23/2016  . Constipation due to opioid therapy 06/13/2014  . Chronic pain syndrome 02/21/2014  .  Allergic reaction 02/20/2014  . Neurofibromatosis, type 1 (HCC) 12/14/2013  . Status post cervical arthrodesis 11/21/2013  . Cervical myelopathy (HCC) 09/21/2013  . Preventative health care 06/23/2010  . HORNER'S SYNDROME 04/12/2009  . Other psoriasis 08/24/2007  . Tinea pedis 08/24/2007    Shauntell Iglesia, PT 09/15/2018, 10:17 PM  [facility] [address] [place], [place], [address] Phone: [phone]   Fax:  [phone]  Name: Larry Riley MRN: [id number] Date of Birth: [birth year]

## 2018-09-21 ENCOUNTER — Encounter: Payer: Self-pay | Admitting: Rehabilitative and Restorative Service Providers"

## 2018-09-21 ENCOUNTER — Other Ambulatory Visit: Payer: Self-pay

## 2018-09-21 ENCOUNTER — Ambulatory Visit: Payer: Managed Care, Other (non HMO) | Admitting: Rehabilitative and Restorative Service Providers"

## 2018-09-21 DIAGNOSIS — R2681 Unsteadiness on feet: Secondary | ICD-10-CM

## 2018-09-21 DIAGNOSIS — R2689 Other abnormalities of gait and mobility: Secondary | ICD-10-CM

## 2018-09-21 DIAGNOSIS — M6281 Muscle weakness (generalized): Secondary | ICD-10-CM | POA: Diagnosis not present

## 2018-09-21 NOTE — Therapy (Signed)
[facility] [address] [place], [place], [address] Phone: [phone]   Fax:  [phone]  Physical Therapy Treatment  Patient Details  Name: Larry Riley MRN: [id number] Date of Birth: 1980/05/16 Referring Provider (PT): Mindi Junker, MD   Encounter Date: 09/21/2018  PT End of Session - 09/21/18 1218    Visit Number  8    Number of Visits  12    Date for PT Re-Evaluation  10/05/18    Authorization Type  cigna managed    PT Start Time  0719    PT Stop Time  0802    PT Time Calculation (min)  43 min    Activity Tolerance  Patient tolerated treatment well    Behavior During Therapy  [facility] for tasks assessed/performed       Past Medical History:  Diagnosis Date  . GERD (gastroesophageal reflux disease) 01/21/2017  . MRSA (methicillin resistant Staphylococcus aureus) 2005   leg  . Neurofibromatosis    tumor down spine and brain    Past Surgical History:  Procedure Laterality Date  . hydrocephalus     [place]unt 1999[place]  . NECK SURGERY      tumor removal  . SPINE SURGERY  10/2013   c2-t2 fusion  , laminectomy c1-6-- Dr Raynald Kemp-- [place]  . VASECTOMY  02/2013    There were no vitals filed for this visit.  Subjective Assessment - 09/21/18 0723    Subjective  The patient reports that he has AFO that he wears intermittently.  He has worse pain and stiffness this morning.  He feels increased weakness like his knees are ready to buckle.    Pertinent History  PMH: s/p multiple spinal decompression surgeries for schwanomatosis.(2015, 2018)    Patient Stated Goals  gain strength, decreased pain, better gait, get functional e stim (has new insurance so hopes to get this approaved)    Currently in Pain?  Yes    Pain Score  7     Pain Location  --   across bilateral shoulders and low back   Pain Orientation  Right;Left    Pain Descriptors / Indicators  Aching    Pain Type  Chronic pain    Pain Onset  More than a month  ago    Pain Frequency  Constant    Aggravating Factors   varies in intensity    Pain Relieving Factors  unsure                       [facility] Adult PT Treatment/Exercise - 09/21/18 0738      Exercises   Exercises  Neck;Shoulder;Lumbar      Neck Exercises: Prone   Neck Retraction  5 reps      Lumbar Exercises: Stretches   Lower Trunk Rotation  2 reps    Hip Flexor Stretch  2 reps    Hip Flexor Stretch Limitations  in prone    Press Ups  3 reps    Other Lumbar Stretch Exercise  quadratus lumborum stretch sidelying      Shoulder Exercises: Supine   Protraction  Strengthening;Right;Left;10 reps;Weights   3 lbs     Shoulder Exercises: Sidelying   Other Sidelying Exercises  Thoracic openers in sidelying x 5 reps R and L sides.      Other Sidelying Exercises  Passive stretching using a towel rolll to lengthen lateral neck musculature       Manual Therapy   Manual Therapy  Soft tissue mobilization;Manual Traction;Passive ROM    Manual therapy comments  Goal of pain reduction and improved ROM    Soft tissue mobilization  L scalenes, L upper trap, suboccipitals    Passive ROM  overpressure into rotation, sidebending, flexion with rotation    Manual Traction  gentle manual traction             PT Education - 09/21/18 1217    Education Details  Added 2 stretches from today.  Plan to consolidate HEP next visit.    Person(s) Educated  Patient    Methods  Explanation;Demonstration;Handout    Comprehension  Verbalized understanding;Returned demonstration          PT Long Term Goals - 09/21/18 1219      PT LONG TERM GOAL #1   Title  improve Lt shoulder strength to 4+/5 and Lt hip flexion to 4+/5, Lt ankle strength at least a half muscle grade.    Time  6    Period  Weeks    Status  On-going    Target Date  10/05/18      PT LONG TERM GOAL #2   Title  Independent with HEP for LLE strengthening and cervical ROM/stretching    Time  6    Period  Days     Status  On-going      PT LONG TERM GOAL #3   Title  improve neck ROM by 10% or more each plane    Time  6    Period  Weeks    Status  On-going      PT LONG TERM GOAL #4   Title  Improve 5x sit to stand score from 12.10 secs to </= 10 secs without UE support from standard chair.    Time  6    Period  Weeks    Status  On-going      PT LONG TERM GOAL #5   Title  Assess use and benefit of Bioness unit for LLE if pt is determined to be appropriate candidate for use of this modality.    Time  6    Period  Weeks    Status  On-going            Plan - 09/21/18 2206    Clinical Impression Statement  The patient's pain level reduces from 7/10 to 4/10 with therapy today.  Patient requested more HEP and we added 2 stretches.  Plan to group exercises for improved compliance at home.  PT also discussed with patient L steppage gait may contribute to increased back pain and encouraged greater use of AFO for foot control during gait.    PT Treatment/Interventions  Electrical Stimulation;Aquatic Therapy;Moist Heat;Ultrasound;Gait training;Functional mobility training;Therapeutic activities;Therapeutic exercise;Balance training;Neuromuscular re-education;Manual techniques;Passive range of motion;Taping    PT Next Visit Plan  *ask about image of home work space*   Focus on postural strengthening, L UE/LE strengthening, work station modifications, f/u with [employer] on his insurance coverage of bioness.    Consulted and Agree with Plan of Care  Patient       Patient will benefit from skilled therapeutic intervention in order to improve the following deficits and impairments:  Abnormal gait, Decreased activity tolerance, Decreased endurance, Decreased balance, Decreased mobility, Decreased range of motion, Difficulty walking, Hypomobility, Postural dysfunction, Pain, Impaired UE functional use, Improper body mechanics  Visit Diagnosis: Muscle weakness (generalized)  Other abnormalities of gait and  mobility  Unsteadiness on feet     Problem List Patient Active  Problem List   Diagnosis Date Noted  . Acute sinus infection 03/09/2017  . External otitis of right ear 03/09/2017  . Thumb laceration, right, initial encounter 03/09/2017  . GERD (gastroesophageal reflux disease) 01/21/2017  . Weakness of left side of body 11/19/2016  . Stress due to illness of family member 06/10/2016  . Syncope 04/23/2016  . Onychomycosis 04/23/2016  . Impacted ear wax 04/23/2016  . Constipation due to opioid therapy 06/13/2014  . Chronic pain syndrome 02/21/2014  . Allergic reaction 02/20/2014  . Neurofibromatosis, type 1 (HCC) 12/14/2013  . Status post cervical arthrodesis 11/21/2013  . Cervical myelopathy (HCC) 09/21/2013  . Preventative health care 06/23/2010  . HORNER'S SYNDROME 04/12/2009  . Other psoriasis 08/24/2007  . Tinea pedis 08/24/2007    Benn Tarver, PT 09/21/2018, 10:15 PM  [facility] [address] [place], [place], [address] Phone: [phone]   Fax:  [phone]  Name: Sandrea HammondJoshua Riley MRN: [id number] Date of Birth: Jun 04, 1980

## 2018-09-21 NOTE — Patient Instructions (Signed)
Access Code: [email or web address]  URL: [email or web address]  Date: 09/21/2018  Prepared by: Rudell Cobb   Exercises Seated Isometric Cervical Rotation - 10 reps - 3 sets - 2x daily - 6x weekly Seated Assisted Cervical Rotation with Towel - 10 reps - 1-2 sets - 2x daily - 6x weekly Seated Cervical Sidebending Stretch - 3 sets - 30 sec hold - 2x daily - 6x weekly Seated Isometric Cervical Extension - 10 reps - 1 sets - 6 hold - 2x daily - 6x weekly Seated Isometric Cervical Sidebending - 10 reps - 1 sets - 2x daily                            - 6x weekly Standing Shoulder Flexion with Dumbbells - 10 reps - 1-2 sets - 2x daily - 6x weekly Shoulder Abduction with Dumbbells - Thumbs Up - 10 reps - 1-2 sets - 2x daily - 6x weekly Sit to Stand without Arm Support - 10 reps - 1-2 sets - 2x daily - 6x weekly Supine Bridge - 10 reps - 1-2 sets - 5 hold - 2x daily - 6x weekly Supine Active Straight Leg Raise - 10 reps - 1-3 sets - 2x daily - 6x weekly Isometric Ankle Eversion at Wall - 10 reps - 1 sets - 6 hold - 2x daily - 6x weekly Prone on Elbows Reach - 10 reps - 1 sets - 1x daily - 6x weekly Isometric Ankle Inversion - 10 reps - 1 sets - 2x daily - 6x weekly Isometric Ankle Dorsiflexion and Plantarflexion - 10 reps - 1 sets - 6 hold - 2x daily - 6x weekly Seated Eccentric Ankle Plantar Flexion with Resistance - Straight Leg - 10 reps - 3 sets - 2x daily - 6x weekly Sidelying Thoracic Lumbar Rotation - 3 reps - 1 sets - 30 seconds hold - 1x daily - 7x weekly Supine Lower Trunk Rotation - 3 reps - 1 sets - 30 seconds hold - 1x daily - 7x weekly

## 2018-09-23 ENCOUNTER — Ambulatory Visit: Payer: Managed Care, Other (non HMO) | Admitting: Rehabilitative and Restorative Service Providers"

## 2018-09-23 ENCOUNTER — Other Ambulatory Visit: Payer: Self-pay

## 2018-09-23 ENCOUNTER — Encounter: Payer: Self-pay | Admitting: Rehabilitative and Restorative Service Providers"

## 2018-09-23 DIAGNOSIS — M542 Cervicalgia: Secondary | ICD-10-CM

## 2018-09-23 DIAGNOSIS — R2681 Unsteadiness on feet: Secondary | ICD-10-CM

## 2018-09-23 DIAGNOSIS — R2689 Other abnormalities of gait and mobility: Secondary | ICD-10-CM

## 2018-09-23 DIAGNOSIS — M6281 Muscle weakness (generalized): Secondary | ICD-10-CM | POA: Diagnosis not present

## 2018-09-23 DIAGNOSIS — R29898 Other symptoms and signs involving the musculoskeletal system: Secondary | ICD-10-CM

## 2018-09-23 NOTE — Therapy (Signed)
[facility] [address] [place], [place], [address] Phone: [phone]   Fax:  [phone]  Physical Therapy Treatment  Patient Details  Name: Larry Riley MRN: [id number] Date of Birth: 13-Dec-1980 Referring Provider (PT): Mindi Junker, MD   Encounter Date: 09/23/2018  PT End of Session - 09/23/18 1632    Visit Number  9    Number of Visits  12    Date for PT Re-Evaluation  10/05/18    Authorization Type  cign[employer] managed    PT Start Time  1233    PT Stop Time  1319    PT Time Calculation (min)  46 min    Activity Tolerance  Patient tolerated treatment well    Behavior During Therapy  [facility] for tasks assessed/performed       Past Medical History:  Diagnosis Date  . GERD (gastroesophageal reflux disease) 01/21/2017  . MRSA (methicillin resistant Staphylococcus aureus) 2005   leg  . Neurofibromatosis    tumor down spine and brain    Past Surgical History:  Procedure Laterality Date  . hydrocephalus     shunt 1999[place]  . NECK SURGERY      tumor removal  . SPINE SURGERY  10/2013   c2-t2 fusion  , laminectomy c1-6-- Dr Raynald Kemp-- [place]  . VASECTOMY  02/2013    There were no vitals filed for this visit.  Subjective Assessment - 09/23/18 1235    Subjective  The patient reports he is feeling a lot better today.  The patient reports he has not gotten to do HEP yet.    Pertinent History  PMH: s/p multiple spinal decompression surgeries for schwanomatosis.(2015, 2018)    Patient Stated Goals  gain strength, decreased pain, better gait, get functional e stim (has new insurance so hopes to get this approaved)    Currently in Pain?  Yes    Pain Score  5     Pain Location  --   L shoulder and low back-- notes much better than earlier this week.   Pain Descriptors / Indicators  Aching    Pain Type  Chronic pain    Pain Onset  More than a month ago    Aggravating Factors   varies in intensity    Pain Relieving Factors   unsure      BEGINNING HEP:  Exercises Seated Isometric Cervical Rotation - 10 reps - 3 sets - 2x daily - 6x weekly Seated Assisted Cervical Rotation with Towel - 10 reps - 1-2 sets - 2x daily - 6x weekly Seated Cervical Sidebending Stretch - 3 sets - 30 sec hold - 2x daily - 6x weekly Seated Isometric Cervical Extension - 10 reps - 1 sets - 6 hold - 2x daily - 6x weekly Seated Isometric Cervical Sidebending - 10 reps - 1 sets - 2x daily                                                                                                                                                                                                                                                                                                             -  6x weekly Standing Shoulder Flexion with Dumbbells - 10 reps - 1-2 sets - 2x daily - 6x weekly Shoulder Abduction with Dumbbells - Thumbs Up - 10 reps - 1-2 sets - 2x daily - 6x weekly Sit to Stand without Arm Support - 10 reps - 1-2 sets - 2x daily - 6x weekly Supine Bridge - 10 reps - 1-2 sets - 5 hold - 2x daily - 6x weekly Supine Active Straight Leg Raise - 10 reps - 1-3 sets - 2x daily - 6x weekly Isometric Ankle Eversion at Wall - 10 reps - 1 sets - 6 hold - 2x daily - 6x weekly Prone on Elbows Reach - 10 reps - 1 sets - 1x daily - 6x weekly Isometric Ankle Inversion - 10 reps - 1 sets - 2x daily - 6x weekly Isometric Ankle Dorsiflexion and Plantarflexion - 10 reps - 1 sets - 6 hold - 2x daily - 6x weekly Seated Eccentric Ankle Plantar Flexion with Resistance - Straight Leg - 10 reps - 3 sets - 2x daily - 6x weekly Sidelying Thoracic Lumbar Rotation - 3 reps - 1 sets - 30 seconds hold - 1x daily - 7x weekly Supine Lower Trunk Rotation - 3 reps - 1 sets - 30 seconds hold - 1x daily - 7x weekly   CONSOLIDATED TO THIS HEP:   Access Code: [email or web address]    URL: [email or web address]    Date: 09/23/2018  Prepared by: Rudell Cobb    Exercises Gastroc Stretch on Wall - 10 reps - 3 sets - 1x daily - 7x weekly Seated Ankle Alphabet - 10 reps - 3 sets - 1x daily - 7x weekly Seated Heel Toe Raises - 10 reps - 3 sets - 1x daily - 7x weekly Squat with Chair Touch - 10 reps - 1 sets - 1x daily - 7x weekly Supine Lower Trunk Rotation - 3 reps - 1 sets - 30 seconds hold - 1x daily - 7x weekly Sidelying Thoracic Lumbar Rotation - 3 reps - 1 sets - 30 seconds hold - 1x daily - 7x weekly Marching Bridge - 10 reps - 3 sets - 1x daily - 7x weekly Supine Active Straight Leg Raise - 10 reps - 1-3 sets - 2x daily - 6x weekly Prone on Elbows Reach - 10 reps - 1 sets - 3-5 hold - 1x daily - 6x weekly Standing Backward Shoulder Rolls - 10 reps - 3 sets - 1x daily - 7x weekly Seated Cervical Rotation AROM - 10 reps - 3 sets - 1x daily - 7x weekly Seated Cervical Flexion AROM - 10 reps - 3 sets - 1x daily - 7x weekly               PT Education - 09/23/18 1631    Education Details  updated HEP and grouped them for better compliance    Person(s) Educated  Patient    Methods  Explanation;Demonstration;Handout    Comprehension  Verbalized understanding;Returned demonstration          PT Long Term Goals - 09/21/18 1219      PT LONG TERM GOAL #1   Title  improve Lt shoulder strength to 4+/5 and Lt hip flexion to 4+/5, Lt ankle strength at least a half muscle grade.    Time  6    Period  Weeks    Status  On-going    Target Date  10/05/18      PT LONG TERM GOAL #2   Title  Independent with  HEP for LLE strengthening and cervical ROM/stretching    Time  6    Period  Days    Status  On-going      PT LONG TERM GOAL #3   Title  improve neck ROM by 10% or more each plane    Time  6    Period  Weeks    Status  On-going      PT LONG TERM GOAL #4   Title  Improve 5x sit to stand score from 12.10 secs to </= 10 secs without UE support from standard chair.    Time  6    Period  Weeks    Status  On-going      PT LONG  TERM GOAL #5   Title  Assess use and benefit of Bioness unit for LLE if pt is determined to be appropriate candidate for use of this modality.    Time  6    Period  Weeks    Status  On-going            Plan - 09/23/18 1634    Clinical Impression Statement  Today's session emphasized consolidation of prior HEP and working on managing exercises for HEP.  Provided patient more HEP due to global nature of deficits with his emphasis on ankle strength, neck pain mgmt, lumbar pain mgmt, LE strengthening.  PT recommended greater use of AFO on the L LE due to foot drop and weakness.    PT Treatment/Interventions  Electrical Stimulation;Aquatic Therapy;Moist Heat;Ultrasound;Gait training;Functional mobility training;Therapeutic activities;Therapeutic exercise;Balance training;Neuromuscular re-education;Manual techniques;Passive range of motion;Taping    PT Next Visit Plan  *ask about image of home work space*   Focus on postural strengthening, L UE/LE strengthening, work station modifications, f/u with [employer] on his insurance coverage of bioness.  Anticipate need for renewal at [facility] reassessment.    Consulted and Agree with Plan of Care  Patient       Patient will benefit from skilled therapeutic intervention in order to improve the following deficits and impairments:  Abnormal gait, Decreased activity tolerance, Decreased endurance, Decreased balance, Decreased mobility, Decreased range of motion, Difficulty walking, Hypomobility, Postural dysfunction, Pain, Impaired UE functional use, Improper body mechanics  Visit Diagnosis: Muscle weakness (generalized)  Other abnormalities of gait and mobility  Unsteadiness on feet  Cervicalgia  Other symptoms and signs involving the musculoskeletal system     Problem List Patient Active Problem List   Diagnosis Date Noted  . Acute sinus infection 03/09/2017  . External otitis of right ear 03/09/2017  . Thumb laceration, right, initial encounter  03/09/2017  . GERD (gastroesophageal reflux disease) 01/21/2017  . Weakness of left side of body 11/19/2016  . Stress due to illness of family member 06/10/2016  . Syncope 04/23/2016  . Onychomycosis 04/23/2016  . Impacted ear wax 04/23/2016  . Constipation due to opioid therapy 06/13/2014  . Chronic pain syndrome 02/21/2014  . Allergic reaction 02/20/2014  . Neurofibromatosis, type 1 (HCC) 12/14/2013  . Status post cervical arthrodesis 11/21/2013  . Cervical myelopathy (HCC) 09/21/2013  . Preventative health care 06/23/2010  . HORNER'S SYNDROME 04/12/2009  . Other psoriasis 08/24/2007  . Tinea pedis 08/24/2007    Tamaria Dunleavy, PT 09/23/2018, 4:36 PM  [facility] [address] [place], [place], [address] Phone: [phone]   Fax:  [phone]  Name: Sandrea HammondJoshua Hodak MRN: [id number] Date of Birth: 08-13-1980

## 2018-09-23 NOTE — Patient Instructions (Signed)
Access Code: [email or web address]  URL: [email or web address]  Date: 09/23/2018  Prepared by: Fallon Howerter   Exercises Gastroc Stretch on Wall - 10 reps - 3 sets - 1x daily - 7x weekly Seated Ankle Alphabet - 10 reps - 3 sets - 1x daily - 7x weekly Seated Heel Toe Raises - 10 reps - 3 sets - 1x daily - 7x weekly Squat with Chair Touch - 10 reps - 1 sets - 1x daily - 7x weekly Supine Lower Trunk Rotation - 3 reps - 1 sets - 30 seconds hold - 1x daily - 7x weekly Sidelying Thoracic Lumbar Rotation - 3 reps - 1 sets - 30 seconds hold - 1x daily - 7x weekly Marching Bridge - 10 reps - 3 sets - 1x daily - 7x weekly Supine Active Straight Leg Raise - 10 reps - 1-3 sets - 2x daily - 6x weekly Prone on Elbows Reach - 10 reps - 1 sets - 3-5 hold - 1x daily - 6x weekly Standing Backward Shoulder Rolls - 10 reps - 3 sets - 1x daily - 7x weekly Seated Cervical Rotation AROM - 10 reps - 3 sets - 1x daily - 7x weekly Seated Cervical Flexion AROM - 10 reps - 3 sets - 1x daily - 7x weekly 

## 2018-09-26 ENCOUNTER — Encounter: Payer: Self-pay | Admitting: Physical Therapy

## 2018-09-26 ENCOUNTER — Ambulatory Visit: Payer: Managed Care, Other (non HMO) | Admitting: Physical Therapy

## 2018-09-26 ENCOUNTER — Other Ambulatory Visit: Payer: Self-pay

## 2018-09-26 DIAGNOSIS — M6281 Muscle weakness (generalized): Secondary | ICD-10-CM | POA: Diagnosis not present

## 2018-09-26 DIAGNOSIS — R2689 Other abnormalities of gait and mobility: Secondary | ICD-10-CM

## 2018-09-26 DIAGNOSIS — M542 Cervicalgia: Secondary | ICD-10-CM

## 2018-09-26 DIAGNOSIS — R2681 Unsteadiness on feet: Secondary | ICD-10-CM

## 2018-09-26 DIAGNOSIS — R29898 Other symptoms and signs involving the musculoskeletal system: Secondary | ICD-10-CM

## 2018-09-26 NOTE — Therapy (Signed)
[facility] [address] [place], [place], [address] Phone: [phone]   Fax:  [phone]  Physical Therapy Treatment  Patient Details  Name: Larry Riley MRN: [id number] Date of Birth: 07/29/1980 Referring Provider (PT): Gaspar Cola, MD   Encounter Date: 09/26/2018  PT End of Session - 09/26/18 1757    Visit Number  10    Number of Visits  12    Date for PT Re-Evaluation  10/05/18    Authorization Type  cigna managed    PT Start Time  1700    PT Stop Time  1742    PT Time Calculation (min)  42 min    Activity Tolerance  Patient tolerated treatment well    Behavior During Therapy  [facility] for tasks assessed/performed       Past Medical History:  Diagnosis Date  . GERD (gastroesophageal reflux disease) 01/21/2017  . MRSA (methicillin resistant Staphylococcus aureus) 2005   leg  . Neurofibromatosis    tumor down spine and brain    Past Surgical History:  Procedure Laterality Date  . hydrocephalus     [place]unt 1999[place]  . NECK SURGERY      tumor removal  . SPINE SURGERY  10/2013   c2-t2 fusion  , laminectomy c1-6-- Dr Rigoberto Noel-- [place]  . VASECTOMY  02/2013    There were no vitals filed for this visit.  Subjective Assessment - 09/26/18 1702    Subjective  Pt reports doing HEP since last visit and felt like re-organization of HEP was helpful.  Pt did not wear AFO to session today.    Pertinent History  PMH: s/p multiple spinal decompression surgeries for schwanomatosis.(2015, 2018)    How long can you sit comfortably?  depends    How long can you stand comfortably?  20 min    How long can you walk comfortably?  30 min with brace    Diagnostic tests  CT and MRI    Patient Stated Goals  gain strength, decreased pain, better gait, get functional e stim (has new insurance so hopes to get this approaved)    Currently in Pain?  Yes    Pain Score  4     Pain Location  --   L shoulder and low back   Pain  Orientation  Left;Lower    Pain Descriptors / Indicators  Aching    Pain Type  Chronic pain    Pain Onset  More than a month ago         Pt performed exercises below with cues for technique and correct form.   Exercises Gastroc Stretch on Wall - 10 reps - 3 sets - 1x daily - 7x weekly Supine Lower Trunk Rotation - 3 reps - 1 sets - 30 seconds hold - 1x daily - 7x weekly Sidelying Thoracic Lumbar Rotation - 3 reps - 1 sets - 30 seconds hold - 1x daily - 7x weekly Marching Bridge - 10 reps - 3 sets - 1x daily - 7x weekly Prone on Elbows Reach - 10 reps - 1 sets - 3-5 hold - 1x daily - 6x weekly                       [facility] Adult PT Treatment/Exercise - 09/26/18 0001      Exercises   Exercises  Knee/Hip      Lumbar Exercises: Aerobic   Nustep  Level 5, all extremities, steps/min maintained around 75-85, x10 min  PT Education - 09/26/18 1711    Education Details  Reviewed PT recommendation for increased wear of L AFO and getting into a routine of stretching throughout work day.  Reviewed exercises from HEP.    Person(s) Educated  Patient    Methods  Explanation    Comprehension  Verbalized understanding;Need further instruction          PT Long Term Goals - 09/21/18 1219      PT LONG TERM GOAL #1   Title  improve Lt shoulder strength to 4+/5 and Lt hip flexion to 4+/5, Lt ankle strength at least a half muscle grade.    Time  6    Period  Weeks    Status  On-going    Target Date  10/05/18      PT LONG TERM GOAL #2   Title  Independent with HEP for LLE strengthening and cervical ROM/stretching    Time  6    Period  Days    Status  On-going      PT LONG TERM GOAL #3   Title  improve neck ROM by 10% or more each plane    Time  6    Period  Weeks    Status  On-going      PT LONG TERM GOAL #4   Title  Improve 5x sit to stand score from 12.10 secs to </= 10 secs without UE support from standard chair.    Time  6    Period  Weeks     Status  On-going      PT LONG TERM GOAL #5   Title  Assess use and benefit of Bioness unit for LLE if pt is determined to be appropriate candidate for use of this modality.    Time  6    Period  Weeks    Status  On-going            Plan - 09/26/18 1721    Clinical Impression Statement  Skilled session focused on warmup for LE and  trunk on Nustep, reviewing and performing exercises from HEP. Pt able to perform HEP with min cues and no increase in pain.  Noted that with some of the strengthening exercises pt's form would degenerate as pt fatigued; encouraged pt to lower reps if needed focusing on good form and increase reps as he gets stronger; pt verbalized understanding.    PT Treatment/Interventions  Electrical Stimulation;Aquatic Therapy;Moist Heat;Ultrasound;Gait training;Functional mobility training;Therapeutic activities;Therapeutic exercise;Balance training;Neuromuscular re-education;Manual techniques;Passive range of motion;Taping    PT Next Visit Plan  *ask about image of home work space*   Focus on postural strengthening, L UE/LE strengthening, work station modifications, f/u with [employer] on his insurance coverage of bioness.  Anticipate need for renewal at [facility] reassessment.    Consulted and Agree with Plan of Care  Patient       Patient will benefit from skilled therapeutic intervention in order to improve the following deficits and impairments:  Abnormal gait, Decreased activity tolerance, Decreased endurance, Decreased balance, Decreased mobility, Decreased range of motion, Difficulty walking, Hypomobility, Postural dysfunction, Pain, Impaired UE functional use, Improper body mechanics  Visit Diagnosis: Muscle weakness (generalized)  Other abnormalities of gait and mobility  Unsteadiness on feet  Cervicalgia  Other symptoms and signs involving the musculoskeletal system     Problem List Patient Active Problem List   Diagnosis Date Noted  . Acute sinus infection  03/09/2017  . External otitis of right ear 03/09/2017  . Thumb  laceration, right, initial encounter 03/09/2017  . GERD (gastroesophageal reflux disease) 01/21/2017  . Weakness of left side of body 11/19/2016  . Stress due to illness of family member 06/10/2016  . Syncope 04/23/2016  . Onychomycosis 04/23/2016  . Impacted ear wax 04/23/2016  . Constipation due to opioid therapy 06/13/2014  . Chronic pain syndrome 02/21/2014  . Allergic reaction 02/20/2014  . Neurofibromatosis, type 1 (HCC) 12/14/2013  . Status post cervical arthrodesis 11/21/2013  . Cervical myelopathy (HCC) 09/21/2013  . Preventative health care 06/23/2010  . HORNER'S SYNDROME 04/12/2009  . Other psoriasis 08/24/2007  . Tinea pedis 08/24/2007   Hortencia Conradi, PTA  09/26/18, 6:02 PM  [facility] [address] [place], [place], [address] Phone: [phone]   Fax:  [phone]  Name: Larry Riley MRN: [id number] Date of Birth: [birth year]

## 2018-09-26 NOTE — Patient Instructions (Signed)
Access Code: [email or web address]  URL: [email or web address]  Date: 09/23/2018  Prepared by: Rudell Cobb   Exercises Gastroc Stretch on Wall - 10 reps - 3 sets - 1x daily - 7x weekly Seated Ankle Alphabet - 10 reps - 3 sets - 1x daily - 7x weekly Seated Heel Toe Raises - 10 reps - 3 sets - 1x daily - 7x weekly Squat with Chair Touch - 10 reps - 1 sets - 1x daily - 7x weekly Supine Lower Trunk Rotation - 3 reps - 1 sets - 30 seconds hold - 1x daily - 7x weekly Sidelying Thoracic Lumbar Rotation - 3 reps - 1 sets - 30 seconds hold - 1x daily - 7x weekly Marching Bridge - 10 reps - 3 sets - 1x daily - 7x weekly Supine Active Straight Leg Raise - 10 reps - 1-3 sets - 2x daily - 6x weekly Prone on Elbows Reach - 10 reps - 1 sets - 3-5 hold - 1x daily - 6x weekly Standing Backward Shoulder Rolls - 10 reps - 3 sets - 1x daily - 7x weekly Seated Cervical Rotation AROM - 10 reps - 3 sets - 1x daily - 7x weekly Seated Cervical Flexion AROM - 10 reps - 3 sets - 1x daily - 7x weekly

## 2018-09-29 ENCOUNTER — Encounter: Payer: Self-pay | Admitting: Rehabilitative and Restorative Service Providers"

## 2018-09-29 ENCOUNTER — Ambulatory Visit: Payer: Managed Care, Other (non HMO) | Admitting: Rehabilitative and Restorative Service Providers"

## 2018-09-29 ENCOUNTER — Other Ambulatory Visit: Payer: Self-pay

## 2018-09-29 DIAGNOSIS — R2689 Other abnormalities of gait and mobility: Secondary | ICD-10-CM

## 2018-09-29 DIAGNOSIS — M6281 Muscle weakness (generalized): Secondary | ICD-10-CM | POA: Diagnosis not present

## 2018-09-29 DIAGNOSIS — R2681 Unsteadiness on feet: Secondary | ICD-10-CM

## 2018-09-29 DIAGNOSIS — M542 Cervicalgia: Secondary | ICD-10-CM

## 2018-09-30 NOTE — Therapy (Signed)
[facility] [address] [place], [place], [address] Phone: [phone]   Fax:  [phone]  Physical Therapy Treatment  Patient Details  Name: Larry Riley MRN: [id number] Date of Birth: 1980/07/03 Referring Provider (PT): Gaspar Cola, MD   Encounter Date: 09/29/2018  PT End of Session - 09/29/18 1709    Visit Number  11    Number of Visits  12    Date for PT Re-Evaluation  10/05/18    Authorization Type  cigna managed    PT Start Time  1705    PT Stop Time  1745    PT Time Calculation (min)  40 min    Activity Tolerance  Patient tolerated treatment well    Behavior During Therapy  [facility] for tasks assessed/performed       Past Medical History:  Diagnosis Date  . GERD (gastroesophageal reflux disease) 01/21/2017  . MRSA (methicillin resistant Staphylococcus aureus) 2005   leg  . Neurofibromatosis    tumor down spine and brain    Past Surgical History:  Procedure Laterality Date  . hydrocephalus     shunt 1999[place]  . NECK SURGERY      tumor removal  . SPINE SURGERY  10/2013   c2-t2 fusion  , laminectomy c1-6-- Dr Rigoberto Noel-- [facility]  . VASECTOMY  02/2013    There were no vitals filed for this visit.  Subjective Assessment - 09/29/18 1707    Subjective  The patient notes pain level down with stretches.    Pertinent History  PMH: s/p multiple spinal decompression surgeries for schwanomatosis.(2015, 2018)    Patient Stated Goals  gain strength, decreased pain, better gait, get functional e stim (has new insurance so hopes to get this approaved)    Currently in Pain?  --   Not pain, tense/ stiffness rated a 5/10   Pain Location  Shoulder    Pain Onset  More than a month ago    Pain Frequency  Constant    Aggravating Factors   varies in intensity    Pain Relieving Factors  unsure         [facility] PT Assessment - 09/29/18 1716      AROM   Cervical Flexion  18    Cervical Extension  20    Cervical -  Right Side Bend  28    Cervical - Left Side Bend  22    Cervical - Right Rotation  50    Cervical - Left Rotation  45      Strength   Overall Strength Comments  L shoulder flexion 5/5, L hip flexion 4+/5, L ankle DF 2/5         [facility] Adult PT Treatment/Exercise - 09/30/18 0759      Ambulation/Gait   Ambulation/Gait  Yes    Ambulation/Gait Assistance  6: Modified independent (Device/Increase time)    Ambulation Distance (Feet)  150 Feet    Assistive device  None    Ambulation Surface  Level;Indoor    Gait Comments  The patient has steppage L gait to clear the L foot.  The patient has AFO but does not currently wear on a regular basis.      Self-Care   Self-Care  Other Self-Care Comments    Other Self-Care Comments   A focus of today's session was discussing PT plan of care and patient's goals.  We discussed:  1) Need for AFO use indoors and outdoors.  PT recommends use of AFO as  this will most likely reduce the pain in his low back.  Use of AFO in the community would improve gait speed and mechanics and allow for safer ambulation (patient occasionally trips on L toes).  2) Awaiting [employer] information on insurance.  3) Aquatics?  The patient wants to trial aquatics with PT and determine if this is worth pursuing as a wellness program in the future.  We discussed benefits of swimming, pool exercise and long term fitness plan.  4) Discussed home office set up and patient to e-mail image of work space.  5) Compliance with HEP.  Patient is working from home and has longer work days and longer periods of immobility.   PT encoraging       Manual Therapy   Manual Therapy  Soft tissue mobilization;Manual Traction    Manual therapy comments  Goal of pain reduction and improved ROM    Soft tissue mobilization  L scalenes, L upper trap, suboccipitals    Manual Traction  gentle manual traction                               PT Long Term Goals - 09/29/18 1715      PT LONG  TERM GOAL #1   Title  improve Lt shoulder strength to 4+/5 and Lt hip flexion to 4+/5, Lt ankle strength at least a half muscle grade.    Baseline  L hip flexion 4+/5, 2/5 L ankle DF, 5/5 L shoulder flexion, 2/5 L ankle PF.    Time  6    Period  Weeks    Status  Not Met    Target Date  10/05/18      PT LONG TERM GOAL #2   Title  Independent with HEP for LLE strengthening and cervical ROM/stretching    Time  6    Period  Days    Status  Achieved      PT LONG TERM GOAL #3   Title  improve neck ROM by 10% or more each plane    Baseline  Patient continues with limitation in neck AROM however progress made (see measurements above).    Time  6    Period  Weeks    Status  Partially Met      PT LONG TERM GOAL #4   Title  Improve 5x sit to stand score from 12.10 secs to </= 10 secs without UE support from standard chair.    Baseline  11.32 seconds without UE support 5x sit to stand.    Time  6    Period  Weeks    Status  Partially Met      PT LONG TERM GOAL #5   Title  Assess use and benefit of Bioness unit for LLE if pt is determined to be appropriate candidate for use of this modality.    Baseline  *PT has submitted paperwork to [employer] regarding insurance change (with patient permission).  Waiting to hear back.    Time  6    Period  Weeks    Status  Deferred      UPDATED LONG TERM GOALS: PT Long Term Goals - 10/01/18 9447      PT LONG TERM GOAL #1   Title  The patient will be independent with progression of HEP.    Time  6    Period  Weeks    Target Date  11/15/18  PT LONG TERM GOAL #2   Title  The patient will verbalize understanding of home office set up for improved mechanics and optimal posture.    Time  6    Period  Weeks    Target Date  11/15/18      PT LONG TERM GOAL #3   Title  The patient will reduce neck/shoulder pain to 2/10 during last 2 hours of work day (pain worsens t/o the day with fatigue and prolonged sitting).    Time  6    Period  Weeks     Target Date  11/15/18      PT LONG TERM GOAL #4   Title  The patient will return demo aquatics exercises for ongoing community wellness plan.    Time  6    Period  Weeks    Target Date  11/15/18      PT LONG TERM GOAL #5   Title  The patient's current AFO will be further assessed and he will verbalize understanding of benefits of use for home walking program.    Time  6    Period  Weeks    Target Date  11/15/18          Plan - 09/30/18 0810    Clinical Impression Statement  Overall, the patient is making progress to LTGs for cervical AROM, pain mgmt, and home exercise program. Areas of focus with renewal are: modification of home work space, aquatics trial to determine if he can access a pool for ongoing therapy, postural stabilization and overall strengthening. PT continuing to emphasize need for L AFO as this may reduce low back pain and postural compensations    PT Frequency  2x / week    PT Duration  4 weeks    PT Treatment/Interventions  Electrical Stimulation;Aquatic Therapy;Moist Heat;Ultrasound;Gait training;Functional mobility training;Therapeutic activities;Therapeutic exercise;Balance training;Neuromuscular re-education;Manual techniques;Passive range of motion;Taping;Orthotic Fit/Training;Patient/family education    PT Next Visit Plan  *ask about image of home work space--supposed to e-mail to [email or web address]; schedule aquatics with Vinnie Level in October; postural strengthening, L UE/LE strengthening, work station modification.  F/u with [place] on his insurance coverage of bioness.    Consulted and Agree with Plan of Care  Patient            Patient will benefit from skilled therapeutic intervention in order to improve the following deficits and impairments:  Abnormal gait, Decreased activity tolerance, Decreased endurance, Decreased balance, Decreased mobility, Decreased range of motion, Difficulty walking, Hypomobility, Postural dysfunction, Pain, Impaired  UE functional use, Improper body mechanics  Visit Diagnosis: Other abnormalities of gait and mobility  Muscle weakness (generalized)  Unsteadiness on feet  Cervicalgia     Problem List Patient Active Problem List   Diagnosis Date Noted  . Acute sinus infection 03/09/2017  . External otitis of right ear 03/09/2017  . Thumb laceration, right, initial encounter 03/09/2017  . GERD (gastroesophageal reflux disease) 01/21/2017  . Weakness of left side of body 11/19/2016  . Stress due to illness of family member 06/10/2016  . Syncope 04/23/2016  . Onychomycosis 04/23/2016  . Impacted ear wax 04/23/2016  . Constipation due to opioid therapy 06/13/2014  . Chronic pain syndrome 02/21/2014  . Allergic reaction 02/20/2014  . Neurofibromatosis, type 1 ([place]) 12/14/2013  . Status post cervical arthrodesis 11/21/2013  . Cervical myelopathy ([employer]) 09/21/2013  . Preventative health care 06/23/2010  . HORNER'S SYNDROME 04/12/2009  . Other psoriasis 08/24/2007  . Tinea pedis 08/24/2007    Yamila Cragin 09/30/2018, 4:15  PM  [facility] [address] [place], [place], [address] Phone: [phone]   Fax:  [phone]  Name: Larry Riley MRN: [id number] Date of Birth: 1980/09/07

## 2018-10-05 ENCOUNTER — Encounter: Payer: Self-pay | Admitting: Physical Therapy

## 2018-10-05 ENCOUNTER — Other Ambulatory Visit: Payer: Self-pay

## 2018-10-05 ENCOUNTER — Ambulatory Visit: Payer: Managed Care, Other (non HMO) | Admitting: Physical Therapy

## 2018-10-05 DIAGNOSIS — M6281 Muscle weakness (generalized): Secondary | ICD-10-CM | POA: Diagnosis not present

## 2018-10-05 DIAGNOSIS — R2689 Other abnormalities of gait and mobility: Secondary | ICD-10-CM

## 2018-10-05 DIAGNOSIS — R2681 Unsteadiness on feet: Secondary | ICD-10-CM

## 2018-10-05 DIAGNOSIS — R29898 Other symptoms and signs involving the musculoskeletal system: Secondary | ICD-10-CM

## 2018-10-05 DIAGNOSIS — R293 Abnormal posture: Secondary | ICD-10-CM

## 2018-10-05 DIAGNOSIS — M542 Cervicalgia: Secondary | ICD-10-CM

## 2018-10-05 NOTE — Therapy (Signed)
[facility] [address] [place], [place], [address] Phone: [phone]   Fax:  [phone]  Physical Therapy Treatment  Patient Details  Name: Larry Riley MRN: [id number] Date of Birth: Oct 18, 1980 Referring Provider (PT): Mindi Junker, MD   Encounter Date: 10/05/2018  PT End of Session - 10/05/18 1015    Visit Number  12    Number of Visits  19    Date for PT Re-Evaluation  11/15/18    Authorization Type  cigna managed - 60 VL    Authorization - Visit Number  12    Authorization - Number of Visits  60    PT Start Time  0720    PT Stop Time  0803    PT Time Calculation (min)  43 min    Activity Tolerance  Patient tolerated treatment well    Behavior During Therapy  [facility] for tasks assessed/performed       Past Medical History:  Diagnosis Date  . GERD (gastroesophageal reflux disease) 01/21/2017  . MRSA (methicillin resistant Staphylococcus aureus) 2005   leg  . Neurofibromatosis    tumor down spine and brain    Past Surgical History:  Procedure Laterality Date  . hydrocephalus     shunt 1999[place]  . NECK SURGERY      tumor removal  . SPINE SURGERY  10/2013   c2-t2 fusion  , laminectomy c1-6-- Dr Raynald Kemp-- [facility]  . VASECTOMY  02/2013    There were no vitals filed for this visit.  Subjective Assessment - 10/05/18 0726    Subjective  Pt did not bring in AFO today, put it by the door but still forgot.  Forgot to take a picture of his work space.  Low back is a little sore today, not sure if he slept wrong.  Still waiting to hear from insurance about Bioness.    Pertinent History  PMH: s/p multiple spinal decompression surgeries for schwanomatosis.(2015, 2018)    Patient Stated Goals  gain strength, decreased pain, better gait, get functional e stim (has new insurance so hopes to get this approaved)    Currently in Pain?  Yes    Pain Score  4     Pain Location  Back    Pain Orientation  Mid    Pain  Descriptors / Indicators  Sore    Pain Type  Chronic pain    Pain Onset  More than a month ago                       [facility] Adult PT Treatment/Exercise - 10/05/18 0738      Lumbar Exercises: Stretches   Active Hamstring Stretch  Right;Left;1 rep;60 seconds    Active Hamstring Stretch Limitations  modified down dog with hands on mat    Standing Side Bend  Left;2 reps;30 seconds    Standing Side Bend Limitations  seated, side bending on mat to R    Other Lumbar Stretch Exercise  Seated forward flexion stretch for erector muscles x 60 seconds      Lumbar Exercises: Aerobic   Elliptical  Level 1 x 5 minutes; initiated elliptical but after 30 seconds stopped due to low back tightness.  Resumed after lumbar stretches with improvement in tolerance - able to complete 2 minutes and then fatigued.  Transitioned to Nustep    Nustep  Level 5 x 3 minutes with bilat UE and LE; minutes with LE only at level 8 x 2  minutes for LE extensor strengthening.  Following Nustep performed one lap around gym to allow cool down      Lumbar Exercises: Prone   Straight Leg Raise  5 reps    Straight Leg Raises Limitations  modified plank with UE supported on mat    Other Prone Lumbar Exercises  Modified Plank pose with UE on mat performing plank <> down dog x 5.  Attempted single arm raise but unable to stabilize; performed one modified side plank on R and then L side but unable to maintain on L side.               PT Education - 10/05/18 1013    Education Details  educated on setting a time to stand up and walk and perform 2 stretches every 2 hours when working; walk while on conference phone call.  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     Time  6    Period  Weeks    Target Date  11/15/18      PT LONG TERM GOAL #2   Title  The patient will verbalize understanding of home office set up for improved mechanics and optimal posture.    Time  6    Period  Weeks    Target Date  11/15/18      PT LONG TERM GOAL #3   Title  The patient will reduce neck/shoulder pain to 2/10 during last 2 hours of work day (pain worsens t/o the day with fatigue and prolonged sitting).    Time  6    Period  Weeks    Target Date  11/15/18      PT LONG TERM GOAL #4   Title  The patient will return demo aquatics exercises for ongoing community wellness plan.    Time  6    Period  Weeks    Target Date  11/15/18      PT LONG TERM GOAL #5   Title  The patient's current AFO will be further assessed and he will verbalize understanding of benefits of use for home walking program.    Time  6    Period  Weeks    Target Date  11/15/18            Plan - 10/05/18 1015    Clinical Impression Statement  Treatment session continued to focus on aerobic conditioning -attempted to progress to elliptical but pt only able to tolerate x 2 minutes due to LE fatigue.  Returned to [employer] and performed with UE and then with LE only for extensor strengthening.  Continued to educate pt on importance of movement during the day to maintain blood flow and oxygem to muscles, joints and nerves and to assist with flow of nutrients to nerves, waste away from nerves (axoplasmic flow).  Recommended pt set a time and perform 2 exercises every 2 hours + walking around apartment.  Performed proximal shoulder, core and hip strengthening in modified plank position with UE on mat.  May need to continue to perform in less demanding position (more upright - hands on table or counter top) or in  sidelying.  Will continue to address and progress towards LTG.    PT Frequency  2x / week    PT Duration  4 weeks    PT Treatment/Interventions  Electrical Stimulation;Aquatic Therapy;Moist  Heat;Ultrasound;Gait training;Functional mobility training;Therapeutic activities;Therapeutic exercise;Balance training;Neuromuscular re-education;Manual techniques;Passive range of motion;Taping;Orthotic Fit/Training;Patient/family education    PT Next Visit Plan  Did he set timer to perform stretches every 2 hours?  continue aerobic conditioning - Nustep > elliptical.  Core and proximal hip/shoulder strengthening in modified plank, side plank. *ask about image of home work space--supposed to e-mail to [email or web address]; schedule aquatics with Vinnie Level in October; postural strengthening, L UE/LE strengthening, work station modification.  F/u with [place] on his insurance coverage of bioness.    Consulted and Agree with Plan of Care  Patient       Patient will benefit from skilled therapeutic intervention in order to improve the following deficits and impairments:  Abnormal gait, Decreased activity tolerance, Decreased endurance, Decreased balance, Decreased mobility, Decreased range of motion, Difficulty walking, Hypomobility, Postural dysfunction, Pain, Impaired UE functional use, Improper body mechanics  Visit Diagnosis: Other abnormalities of gait and mobility  Muscle weakness (generalized)  Unsteadiness on feet  Cervicalgia  Other symptoms and signs involving the musculoskeletal system  Abnormal posture     Problem List Patient Active Problem List   Diagnosis Date Noted  . Acute sinus infection 03/09/2017  . External otitis of right ear 03/09/2017  . Thumb laceration, right, initial encounter 03/09/2017  . GERD (gastroesophageal reflux disease) 01/21/2017  . Weakness of left side of body 11/19/2016  . Stress due to illness of family member 06/10/2016  . Syncope 04/23/2016  . Onychomycosis 04/23/2016  . Impacted ear wax 04/23/2016  . Constipation due to opioid therapy 06/13/2014  . Chronic pain syndrome 02/21/2014  . Allergic reaction 02/20/2014  .  Neurofibromatosis, type 1 ([place]) 12/14/2013  . Status post cervical arthrodesis 11/21/2013  . Cervical myelopathy ([place]) 09/21/2013  . Preventative health care 06/23/2010  . HORNER'S SYNDROME 04/12/2009  . Other psoriasis 08/24/2007  . Tinea pedis 08/24/2007   Rico Junker, PT, DPT 10/05/18    11:34 AM    [facility] [address] [place], [place], [address] Phone: [phone]   Fax:  [phone]  Name: Larry Riley MRN: [id number] Date of Birth: 11-18-1980

## 2018-10-05 NOTE — Patient Instructions (Signed)
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 Pick one or two stretches that work really well to perform every 2 hours.

## 2018-10-07 ENCOUNTER — Other Ambulatory Visit: Payer: Self-pay

## 2018-10-07 ENCOUNTER — Ambulatory Visit: Payer: Managed Care, Other (non HMO) | Admitting: Physical Therapy

## 2018-10-07 ENCOUNTER — Encounter: Payer: Self-pay | Admitting: Physical Therapy

## 2018-10-07 DIAGNOSIS — R2689 Other abnormalities of gait and mobility: Secondary | ICD-10-CM

## 2018-10-07 DIAGNOSIS — R2681 Unsteadiness on feet: Secondary | ICD-10-CM

## 2018-10-07 DIAGNOSIS — M6281 Muscle weakness (generalized): Secondary | ICD-10-CM | POA: Diagnosis not present

## 2018-10-07 DIAGNOSIS — M542 Cervicalgia: Secondary | ICD-10-CM

## 2018-10-07 DIAGNOSIS — R293 Abnormal posture: Secondary | ICD-10-CM

## 2018-10-07 DIAGNOSIS — R29898 Other symptoms and signs involving the musculoskeletal system: Secondary | ICD-10-CM

## 2018-10-07 NOTE — Patient Instructions (Addendum)
Access Code: [email or web address]  URL: [email or web address]  Date: 10/07/2018  Prepared by: Misty Stanley   Exercises Gastroc Stretch on Wall - 10 reps - 3 sets - 1x daily - 7x weekly Squat with Chair Touch - 10 reps - 1 sets - 1x daily - 7x weekly Supine Lower Trunk Rotation - 3 reps - 1 sets - 30 seconds hold - 1x daily - 7x weekly Marching Bridge - 10 reps - 3 sets - 1x daily - 7x weekly Supine Active Straight Leg Raise - 10 reps - 1-3 sets - 2x daily - 6x weekly Prone on Elbows Reach - 10 reps - 1 sets - 3-5 hold - 1x daily - 6x weekly Standing Backward Shoulder Rolls - 10 reps - 3 sets - 1x daily - 7x weekly Seated Cervical Rotation AROM - 10 reps - 3 sets - 1x daily - 7x weekly Seated Cervical Flexion AROM - 10 reps - 3 sets - 1x daily - 7x weekly Seated Cat Camel - 10 reps - 2-3x daily - 7x weekly Seated Thoracic Flexion and Rotation with Arms Crossed - 10 reps - 3 sets - 1x daily - 7x weekly Seated Flexion Stretch - 5 reps - 10 second hold - 2-3x daily - 7x weekly Seated Sidebending - 10 reps - 3 sets - 1x daily - 7x weekly

## 2018-10-07 NOTE — Therapy (Signed)
[facility] [address] [place], [place], [address] Phone: [phone]   Fax:  [phone]  Physical Therapy Treatment  Patient Details  Name: Larry HammondJoshua Riley MRN: [id number] Date of Birth: 12-24-1980 Referring Provider (PT): Mindi JunkerHsu, [place], MD   Encounter Date: 10/07/2018  PT End of Session - 10/07/18 1259    Visit Number  13    Number of Visits  19    Date for PT Re-Evaluation  11/15/18    Authorization Type  cigna managed - 60 VL    Authorization - Visit Number  13    Authorization - Number of Visits  60    PT Start Time  1146    PT Stop Time  1233    PT Time Calculation (min)  47 min    Activity Tolerance  Patient tolerated treatment well    Behavior During Therapy  [facility] for tasks assessed/performed       Past Medical History:  Diagnosis Date  . GERD (gastroesophageal reflux disease) 01/21/2017  . MRSA (methicillin resistant Staphylococcus aureus) 2005   leg  . Neurofibromatosis    tumor down spine and brain    Past Surgical History:  Procedure Laterality Date  . hydrocephalus     [place]unt [place]  . NECK SURGERY      tumor removal  . SPINE SURGERY  10/2013   c2-t2 fusion  , laminectomy c1-6-- Dr Raynald KempHSU-- [place]  . VASECTOMY  02/2013    There were no vitals filed for this visit.  Subjective Assessment - 10/07/18 1149    Subjective  Was a little sore after last session but was better the next day.  Brought in AFO for PT to look at.  Has been taking breaks during work day to do stretches instead of taking tylenol and he is noticing a difference in his pain overall.  Is much lower today despite the weather.    Pertinent History  PMH: s/p multiple spinal decompression surgeries for schwanomatosis.(2015, 2018)    Patient Stated Goals  gain strength, decreased pain, better gait, get functional e stim (has new insurance so hopes to get this approaved)    Pain Score  2     Pain Location  Shoulder    Pain  Orientation  Right    Pain Descriptors / Indicators  Sore    Pain Onset  More than a month ago                       [facility] PT Treatment/Exercise - 10/07/18 1152      Ambulation/Gait   Ambulation/Gait  Yes    Ambulation/Gait Assistance  6: Modified independent (Device/Increase time)    Ambulation Distance (Feet)  230 Feet    Assistive device  None    Ambulation Surface  Level;Indoor    Gait Comments  current AFO is ottobock ground reaction AFO with medial strut paired with [employer] shoes without medial support - due to location of strut and pt's tendency to over pronate medial malleolus experiences increased pressure against strut.  Discussed possibility of changing to another type of AFO with lateral strut or posterior strut to decrease increased pressure on medial malleolus and increase patient's compliance with wear.  Unable to locate trial L ground reaction Thuasne so trialed Thuasne posterior leaf spring AFO.  Pt noted improved comfort and decreased pressure on medial ankle but with change in mechanics of the AFO (spring loaded vs. ground reaction) pt did not  feel as stable or confident with his gait.  Will attempt to have orthotics company bring in sample L ground reaction AFO for pt to trial.  Pt would be willing to change AFO and wear more consistently for longer distances to improve gait efficiency and safety.       Therapeutic Activites    Therapeutic Activities  Work [employer];Other Therapeutic Activities    Work Lawyer of his home work area to Transport planner.  Requested that when patient gets home he have his wife take a picture of him sitting at his work space to allow therapists to make more specific recommendations for set up.  Pt reports that most of his back pain occurs after he has been sitting for a long time and when he first stands up.  Pt does not feel an increase in back pain with walking but does become fatigued from lifting his LLE up  higher to clear the foot.  Performed seated postural and back ROM exercises in office chair to simulate performing in his office chair at home (see exercises below).          Access Code: [email or web address]  URL: [email or web address]  Date: 10/07/2018  Prepared by: Misty Stanley   Exercises Gastroc Stretch on Wall - 10 reps - 3 sets - 1x daily - 7x weekly Squat with Chair Touch - 10 reps - 1 sets - 1x daily - 7x weekly Supine Lower Trunk Rotation - 3 reps - 1 sets - 30 seconds hold - 1x daily - 7x weekly Marching Bridge - 10 reps - 3 sets - 1x daily - 7x weekly Supine Active Straight Leg Raise - 10 reps - 1-3 sets - 2x daily - 6x weekly Prone on Elbows Reach - 10 reps - 1 sets - 3-5 hold - 1x daily - 6x weekly Standing Backward Shoulder Rolls - 10 reps - 3 sets - 1x daily - 7x weekly Seated Cervical Rotation AROM - 10 reps - 3 sets - 1x daily - 7x weekly Seated Cervical Flexion AROM - 10 reps - 3 sets - 1x daily - 7x weekly   New seated lumbar and thoracic exercises performed today seated in office chair: Seated Cat Camel - 10 reps - 2-3x daily - 7x weekly Seated Thoracic Flexion and Rotation with Arms Crossed - 10 reps - 3 sets - 1x daily - 7x weekly Seated Flexion Stretch - 5 reps - 10 second hold - 2-3x daily - 7x weekly Seated Sidebending - 10 reps - 3 sets - 1x daily - 7x weekly      PT Education - 10/07/18 1255    Education Details  updated HEP and added seated lumbar and thoracic AROM to improve ROM and decrease tension/pain after prolonged sitting.  Pt to email pictures of himself sitting at work space for more specific recommendations.  Educated on difference between AFO.    Person(s) Educated  Patient    Methods  Explanation;Demonstration;Handout    Comprehension  Verbalized understanding;Returned demonstration          PT Long Term Goals - 10/01/18 0811      PT LONG TERM GOAL #1   Title  The patient will be independent with progression of HEP.    Time  6     Period  Weeks    Target Date  11/15/18      PT LONG TERM GOAL #2   Title  The patient will verbalize understanding of home office set up for  improved mechanics and optimal posture.    Time  6    Period  Weeks    Target Date  11/15/18      PT LONG TERM GOAL #3   Title  The patient will reduce neck/shoulder pain to 2/10 during last 2 hours of work day (pain worsens t/o the day with fatigue and prolonged sitting).    Time  6    Period  Weeks    Target Date  11/15/18      PT LONG TERM GOAL #4   Title  The patient will return demo aquatics exercises for ongoing community wellness plan.    Time  6    Period  Weeks    Target Date  11/15/18      PT LONG TERM GOAL #5   Title  The patient's current AFO will be further assessed and he will verbalize understanding of benefits of use for home walking program.    Time  6    Period  Weeks    Target Date  11/15/18            Plan - 10/07/18 1259    Clinical Impression Statement  Continued to focus on modifications to improve ROM, decrease pain and improve safety and efficiency of gait.  Pt does report a significant decrease in pain today after performing a couple days of intermittent breaks with stretching.  Added lumbar and thoracic seated stretches to address back pain after prolonged sitting.  Will continue to look at and address work space set up.  Will also continue to trial other AFO with lateral struts to decrease pressure on medial ankle with prolonged gait.    PT Frequency  2x / week    PT Duration  4 weeks    PT Treatment/Interventions  Electrical Stimulation;Aquatic Therapy;Moist Heat;Ultrasound;Gait training;Functional mobility training;Therapeutic activities;Therapeutic exercise;Balance training;Neuromuscular re-education;Manual techniques;Passive range of motion;Taping;Orthotic Fit/Training;Patient/family education    PT Next Visit Plan  LE strengthening exercises with ankle weights.  Trial ground reaction AFO with lateral  strut for LLE; look at work space with pt sitting at it and make recommendations to change set up. continue aerobic conditioning - Nustep > elliptical.  Core and proximal hip/shoulder strengthening in modified plank, side plank. *ask about image of home work space--supposed to e-mail to [email or web address]; schedule aquatics with Rosalita Chessman in October; postural strengthening, L UE/LE strengthening, work station modification.  F/u with MIchelle on his insurance coverage of [employer].    Consulted and Agree with Plan of Care  Patient       Patient will benefit from skilled therapeutic intervention in order to improve the following deficits and impairments:  Abnormal gait, Decreased activity tolerance, Decreased endurance, Decreased balance, Decreased mobility, Decreased range of motion, Difficulty walking, Hypomobility, Postural dysfunction, Pain, Impaired UE functional use, Improper body mechanics  Visit Diagnosis: Other abnormalities of gait and mobility  Muscle weakness (generalized)  Unsteadiness on feet  Cervicalgia  Other symptoms and signs involving the musculoskeletal system  Abnormal posture     Problem List Patient Active Problem List   Diagnosis Date Noted  . Acute sinus infection 03/09/2017  . External otitis of right ear 03/09/2017  . Thumb laceration, right, initial encounter 03/09/2017  . GERD (gastroesophageal reflux disease) 01/21/2017  . Weakness of left side of body 11/19/2016  . Stress due to illness of family member 06/10/2016  . Syncope 04/23/2016  . Onychomycosis 04/23/2016  . Impacted ear wax 04/23/2016  . Constipation due to opioid therapy 06/13/2014  .  Chronic pain syndrome 02/21/2014  . Allergic reaction 02/20/2014  . Neurofibromatosis, type 1 (HCC) 12/14/2013  . Status post cervical arthrodesis 11/21/2013  . Cervical myelopathy (HCC) 09/21/2013  . Preventative health care 06/23/2010  . HORNER'S SYNDROME 04/12/2009  . Other psoriasis  08/24/2007  . Tinea pedis 08/24/2007    Dierdre Highman, PT, DPT 10/07/18    1:06 PM    [facility] [address] [place], [place], [address] Phone: [phone]   Fax:  [phone]  Name: Larry Riley MRN: [id number] Date of Birth: 28-Mar-1980

## 2018-10-14 ENCOUNTER — Ambulatory Visit: Payer: Managed Care, Other (non HMO) | Admitting: Physical Therapy

## 2018-10-24 ENCOUNTER — Ambulatory Visit
Payer: Managed Care, Other (non HMO) | Attending: Neurosurgery | Admitting: Rehabilitative and Restorative Service Providers"

## 2018-10-24 ENCOUNTER — Encounter: Payer: Self-pay | Admitting: Rehabilitative and Restorative Service Providers"

## 2018-10-24 ENCOUNTER — Other Ambulatory Visit: Payer: Self-pay

## 2018-10-24 DIAGNOSIS — R278 Other lack of coordination: Secondary | ICD-10-CM | POA: Diagnosis present

## 2018-10-24 DIAGNOSIS — R2689 Other abnormalities of gait and mobility: Secondary | ICD-10-CM

## 2018-10-24 DIAGNOSIS — R2681 Unsteadiness on feet: Secondary | ICD-10-CM

## 2018-10-24 DIAGNOSIS — R29898 Other symptoms and signs involving the musculoskeletal system: Secondary | ICD-10-CM

## 2018-10-24 DIAGNOSIS — R209 Unspecified disturbances of skin sensation: Secondary | ICD-10-CM | POA: Diagnosis present

## 2018-10-24 DIAGNOSIS — R293 Abnormal posture: Secondary | ICD-10-CM | POA: Diagnosis present

## 2018-10-24 DIAGNOSIS — M6281 Muscle weakness (generalized): Secondary | ICD-10-CM

## 2018-10-24 DIAGNOSIS — M542 Cervicalgia: Secondary | ICD-10-CM

## 2018-10-24 NOTE — Therapy (Signed)
[facility] [address] [place], [place], [address] Phone: [phone]   Fax:  [phone]  Physical Therapy Treatment  Patient Details  Name: Larry Riley MRN: [id number] Date of Birth: 05/09/1980 Referring Provider (PT): Mindi JunkerHsu, [place], MD   Encounter Date: 10/24/2018  PT End of Session - 10/24/18 1313    Visit Number  14    Number of Visits  19    Date for PT Re-Evaluation  11/15/18    Authorization Type  cigna managed - 60 VL    Authorization - Visit Number  14    Authorization - Number of Visits  60    PT Start Time  1233    PT Stop Time  1316    PT Time Calculation (min)  43 min    Activity Tolerance  Patient tolerated treatment well    Behavior During Therapy  WFL for tasks assessed/performed       Past Medical History:  Diagnosis Date  . GERD (gastroesophageal reflux disease) 01/21/2017  . MRSA (methicillin resistant Staphylococcus aureus) 2005   leg  . Neurofibromatosis    tumor down spine and brain    Past Surgical History:  Procedure Laterality Date  . hydrocephalus     [place]unt 1999[place]  . NECK SURGERY      tumor removal  . SPINE SURGERY  10/2013   c2-t2 fusion  , laminectomy c1-6-- Dr Raynald KempHSU-- [place]  . VASECTOMY  02/2013    There were no vitals filed for this visit.  Subjective Assessment - 10/24/18 1236    Subjective  The patient returned from vacation last night.  He did not perform home exercises while away, but stretched as needed.  The patient notes driving increased neck pain.  Pt has message to f/u with [employer] but has not returned call.  He was going to send picture of himself at desk, however was out of town.    Pertinent History  PMH: s/p multiple spinal decompression surgeries for schwanomatosis.(2015, 2018)    Patient Stated Goals  gain strength, decreased pain, better gait, get functional e stim (has new insurance so hopes to get this approaved)    Currently in Pain?  Yes     Pain Score  4     Pain Location  Shoulder   neck occasionally   Pain Orientation  Right;Left    Pain Descriptors / Indicators  Sore    Pain Onset  More than a month ago    Pain Frequency  Constant    Aggravating Factors   varies in intensity    Pain Relieving Factors  unsure         [facility] PT Assessment - 10/24/18 1259      Ambulation/Gait   Ambulation Distance (Feet)  230 Feet   with each AFO donned.  115 ft into clinic without AFO.                  [facility] Adult PT Treatment/Exercise - 10/24/18 1259      Ambulation/Gait   Ambulation/Gait  Yes    Ambulation/Gait Assistance  6: Modified independent (Device/Increase time)    Assistive device  None    Ambulation Surface  Level;Indoor    Gait Comments  Patient trialed L posterior leaf Thuasne AFO and then L Ground reaction Thuasne.  He notices greater heel sliding L ground reaction AFO versus posterior loading AFO.  *PT will schedule brace appt to trial options.      Self-Care  Self-Care  Other Self-Care Comments    Other Self-Care Comments   Discussed home work station set up.  Recommended computer screen be at height level with eyes (in picture that was e-mailed, it appears it may be higher.  Patient to take a picture of himself at workstation.    Also discussed shoewear recommending stability shoe for overpronation.  Discussed that it may be best to set up AFO consult and then get shoes while awaiting braces.        Exercises   Exercises  Other Exercises    Other Exercises   Modified plank x 5 reps x 5 seconds, side plank R side x 3 reps x 5 second holds, L side plank *unable to do*, quadriped to side sitting and return to quadriped x 5 reps to each side, sci fit x 6 minutes with UEs/LEs.                   PT Long Term Goals - 10/24/18 1313      PT LONG TERM GOAL #1   Title  The patient will be independent with progression of HEP.    Time  6    Period  Weeks    Target Date  11/15/18   longer length  of goals due to patient out of town and dec'd appt availability     PT [place] #2   Title  The patient will verbalize understanding of home office set up for improved mechanics and optimal posture.    Time  6    Period  Weeks      PT LONG TERM GOAL #3   Title  The patient will reduce neck/shoulder pain to 2/10 during last 2 hours of work day (pain worsens t/o the day with fatigue and prolonged sitting).    Time  6    Period  Weeks      PT LONG TERM GOAL #4   Title  The patient will return demo aquatics exercises for ongoing community wellness plan.    Time  6    Period  Weeks      PT LONG TERM GOAL #5   Title  The patient's current AFO will be further assessed and he will verbalize understanding of benefits of use for home walking program.    Time  6    Period  Weeks            Plan - 10/24/18 1314    Clinical Impression Statement  The patient tolerated both the L thuasne posterior AFO and the L thuasne ground reaction AFO *both have posterior/lateral posting, which may be a better fit due to overpronation of the foot.  The patient will also benefit from stability shoes to help maintain ankle at neutral.  PT discussed that this will assist in gait mechanics, which should reduce low back compensations and improve symmetry.  The patient has HEP, but has not been performing regularly.  We discussed addressing AFO, aquatics scheduling, gym routine set up until work gym opens (considering paying for month to month membership).    PT Treatment/Interventions  Electrical Stimulation;Aquatic Therapy;Moist Heat;Ultrasound;Gait training;Functional mobility training;Therapeutic activities;Therapeutic exercise;Balance training;Neuromuscular re-education;Manual techniques;Passive range of motion;Taping;Orthotic Fit/Training;Patient/family education    PT Next Visit Plan  Review HEP, strengthening throughout L UE/LE.  Check on AFO consult, set up aquatics session.  Set up aerobic conditioning  for gym.  Core strengthening, plank and side plank (modified), work station modification.  *Patient to call [employer] (has  message and has not returned call).    Consulted and Agree with Plan of Care  Patient       Patient will benefit from skilled therapeutic intervention in order to improve the following deficits and impairments:  Abnormal gait, Decreased activity tolerance, Decreased endurance, Decreased balance, Decreased mobility, Decreased range of motion, Difficulty walking, Hypomobility, Postural dysfunction, Pain, Impaired UE functional use, Improper body mechanics  Visit Diagnosis: Other abnormalities of gait and mobility  Muscle weakness (generalized)  Unsteadiness on feet  Cervicalgia  Other symptoms and signs involving the musculoskeletal system     Problem List Patient Active Problem List   Diagnosis Date Noted  . Acute sinus infection 03/09/2017  . External otitis of right ear 03/09/2017  . Thumb laceration, right, initial encounter 03/09/2017  . GERD (gastroesophageal reflux disease) 01/21/2017  . Weakness of left side of body 11/19/2016  . Stress due to illness of family member 06/10/2016  . Syncope 04/23/2016  . Onychomycosis 04/23/2016  . Impacted ear wax 04/23/2016  . Constipation due to opioid therapy 06/13/2014  . Chronic pain syndrome 02/21/2014  . Allergic reaction 02/20/2014  . Neurofibromatosis, type 1 (HCC) 12/14/2013  . Status post cervical arthrodesis 11/21/2013  . Cervical myelopathy (HCC) 09/21/2013  . Preventative health care 06/23/2010  . HORNER'S SYNDROME 04/12/2009  . Other psoriasis 08/24/2007  . Tinea pedis 08/24/2007    Larry Riley , PT 10/24/2018, 10:34 PM  [facility] [address] [place], [place], [address] Phone: [phone]   Fax:  [phone]  Name: Larry Riley MRN: [id number] Date of Birth: 03/04/1980

## 2018-10-26 ENCOUNTER — Encounter: Payer: Self-pay | Admitting: Rehabilitative and Restorative Service Providers"

## 2018-10-26 ENCOUNTER — Other Ambulatory Visit: Payer: Self-pay

## 2018-10-26 ENCOUNTER — Ambulatory Visit: Payer: Managed Care, Other (non HMO) | Admitting: Rehabilitative and Restorative Service Providers"

## 2018-10-26 DIAGNOSIS — R2689 Other abnormalities of gait and mobility: Secondary | ICD-10-CM | POA: Diagnosis not present

## 2018-10-26 DIAGNOSIS — R2681 Unsteadiness on feet: Secondary | ICD-10-CM

## 2018-10-26 DIAGNOSIS — M6281 Muscle weakness (generalized): Secondary | ICD-10-CM

## 2018-10-26 NOTE — Therapy (Signed)
[facility] [address] [place], [place], [address] Phone: [phone]   Fax:  [phone]  Physical Therapy Treatment  Patient Details  Name: Larry HammondJoshua Riley MRN: [id number] Date of Birth: [birth year] Referring Provider (PT): Mindi JunkerHsu, [place], MD   Encounter Date: 10/26/2018  PT End of Session - 10/26/18 0759    Visit Number  15    Number of Visits  19    Date for PT Re-Evaluation  11/15/18    Authorization Type  cigna managed - 60 VL    Authorization - Visit Number  14    Authorization - Number of Visits  60    PT Start Time  0721    PT Stop Time  0801    PT Time Calculation (min)  40 min    Activity Tolerance  Patient tolerated treatment well    Behavior During Therapy  [facility] for tasks assessed/performed       Past Medical History:  Diagnosis Date  . GERD (gastroesophageal reflux disease) 01/21/2017  . MRSA (methicillin resistant Staphylococcus aureus) 2005   leg  . Neurofibromatosis    tumor down spine and brain    Past Surgical History:  Procedure Laterality Date  . hydrocephalus     [place]unt 1999[place]  . NECK SURGERY      tumor removal  . SPINE SURGERY  10/2013   c2-t2 fusion  , laminectomy c1-6-- Dr Raynald KempHSU-- [place]  . VASECTOMY  02/2013    There were no vitals filed for this visit.  Subjective Assessment - 10/26/18 0723    Subjective  The patient reports he has not had a chance to call [employer] to see what their message was.  He has not had a chance to do HEP since returning from vacation- playing catch up at work.    Pertinent History  PMH: s/p multiple spinal decompression surgeries for schwanomatosis.(2015, 2018)    Patient Stated Goals  gain strength, decreased pain, better gait, get functional e stim (has new insurance so hopes to get this approaved)    Currently in Pain?  Yes    Pain Score  4     Pain Location  Shoulder   also neck and low back   Pain Orientation  Right;Left    Pain Descriptors /  Indicators  Sore    Pain Onset  More than a month ago    Pain Frequency  Constant    Aggravating Factors   varies in intensity    Pain Relieving Factors  unsure                       [facility] Adult PT Treatment/Exercise - 10/26/18 0741      Self-Care   Self-Care  Other Self-Care Comments    Other Self-Care Comments   Discussed that patient knows how to modify work space, but has not yet had time to address.        Exercises   Exercises  Lumbar;Knee/Hip    Other Exercises   Also performed sci-fit x 6 minutes at level 4 for UEs/LEs discussing gym routine, cardio activities and regular exercise.  Also discussed prior mention of aquatics *see clinical impression statement.      Lumbar Exercises: Stretches   Lower Trunk Rotation  2 reps;30 seconds      Lumbar Exercises: Supine   Bridge with March  10 reps      Lumbar Exercises: Sidelying   Other Sidelying Lumbar Exercises  side plank  modified/ able to perform with R shoulder loading, does a variation of this for L elbow loading and uses R UE to assist.        Lumbar Exercises: Prone   Other Prone Lumbar Exercises  modified plank on knees/ attempted without knees, however patient unable to perform with lumbar sagging      Knee/Hip Exercises: Sidelying   Hip ABduction  Strengthening;Right;Left;10 reps   with cues for hip positioning     Shoulder Exercises: Prone   Flexion  Strengthening;Right;Left;10 reps    Flexion Limitations  Performed prone on elbows reaching with L cervical isometrics to bring head to midline for postural stabilization and strengthening.                  PT Long Term Goals - 10/26/18 0818      PT LONG TERM GOAL #1   Title  The patient will be independent with progression of HEP.    Baseline  The patient has HEP, but is not performing regularly.    Time  6    Period  Weeks    Status  On-going      PT LONG TERM GOAL #2   Title  The patient will verbalize understanding of home  office set up for improved mechanics and optimal posture.    Baseline  *Patient has recommendations for home office set up (his desk is high, so raising chair, using a stool for feet and potentially needing to lower the monitor).    Time  6    Period  Weeks    Status  Achieved      PT LONG TERM GOAL #3   Title  The patient will reduce neck/shoulder pain to 2/10 during last 2 hours of work day (pain worsens t/o the day with fatigue and prolonged sitting).    Time  6    Period  Weeks    Status  On-going      PT LONG TERM GOAL #4   Title  The patient will return demo aquatics exercises for ongoing community wellness plan.    Time  6    Period  Weeks    Status  On-going      PT LONG TERM GOAL #5   Title  The patient's current AFO will be further assessed and he will verbalize understanding of benefits of use for home walking program.    Time  6    Period  Weeks            Plan - 10/26/18 0819    Clinical Impression Statement  The patient has follow-up items to complete including adjusting his work space, performing his HEP.  PT faxed AFO order *awaiting response* to schedule.  The patient has expressed interest in aquatics and this is an option that would be beneficial, however we discussed that unless he plans to carry this over to community exercise, it may not be the right time.  He is not currently taking time to implement current recommendations for work station and home exercise.  PT feels like focusing on current HEP, obtaining AFO, obtaining stability shoes will be most beneficial for the patient at this time.    PT Treatment/Interventions  Electrical Stimulation;Aquatic Therapy;Moist Heat;Ultrasound;Gait training;Functional mobility training;Therapeutic activities;Therapeutic exercise;Balance training;Neuromuscular re-education;Manual techniques;Passive range of motion;Taping;Orthotic Fit/Training;Patient/family education    PT Next Visit Plan  Continue to emphasize  implementation of current recommendations including:  adjust home work space, comply with current HEP.  Follow-up on AFO  order (recommending a laterally posting AFO with stability shoe due to overpronation).  Consider aquatics  (I do not anticipate this will make meaningful change due to inability to carry this over to ongoing routine and he is not currently fitting exercise in).  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  *Patient has message to return call to [employer]-- has not had time to return call.    Consulted and Agree with Plan of Care  Patient       Patient will benefit from skilled therapeutic intervention in order to improve the following deficits and impairments:  Abnormal gait, Decreased activity tolerance, Decreased endurance, Decreased balance, Decreased mobility, Decreased range of motion, Difficulty walking, Hypomobility, Postural dysfunction, Pain, Impaired UE functional use, Improper body mechanics  Visit Diagnosis: No diagnosis found.     Problem List Patient Active Problem List   Diagnosis Date Noted  . Acute sinus infection 03/09/2017  . External otitis of right ear 03/09/2017  . Thumb laceration, right, initial encounter 03/09/2017  . GERD (gastroesophageal reflux disease) 01/21/2017  . Weakness of left side of body 11/19/2016  . Stress due to illness of family member 06/10/2016  . Syncope 04/23/2016  . Onychomycosis 04/23/2016  . Impacted ear wax 04/23/2016  . Constipation due to opioid therapy 06/13/2014  . Chronic pain syndrome 02/21/2014  . Allergic reaction 02/20/2014  . Neurofibromatosis, type 1 (HCC) 12/14/2013  . Status post cervical arthrodesis 11/21/2013  . Cervical myelopathy (HCC) 09/21/2013  . Preventative health care 06/23/2010  . HORNER'S SYNDROME 04/12/2009  . Other psoriasis 08/24/2007  . Tinea pedis 08/24/2007    Celica Kotowski,  PT 10/26/2018, 8:30 AM  [facility] [address] [place], [place], [address] Phone: [phone]   Fax:  [phone]  Name: Brylan Dec MRN: [id number] Date of Birth: March 07, 1980

## 2018-10-31 ENCOUNTER — Ambulatory Visit: Payer: Managed Care, Other (non HMO) | Admitting: Physical Therapy

## 2018-11-02 ENCOUNTER — Ambulatory Visit: Payer: Managed Care, Other (non HMO) | Admitting: Physical Therapy

## 2018-11-02 ENCOUNTER — Encounter: Payer: Self-pay | Admitting: Physical Therapy

## 2018-11-02 ENCOUNTER — Other Ambulatory Visit: Payer: Self-pay

## 2018-11-02 DIAGNOSIS — R293 Abnormal posture: Secondary | ICD-10-CM

## 2018-11-02 DIAGNOSIS — M6281 Muscle weakness (generalized): Secondary | ICD-10-CM

## 2018-11-02 DIAGNOSIS — R2689 Other abnormalities of gait and mobility: Secondary | ICD-10-CM

## 2018-11-02 DIAGNOSIS — M542 Cervicalgia: Secondary | ICD-10-CM

## 2018-11-02 DIAGNOSIS — R29898 Other symptoms and signs involving the musculoskeletal system: Secondary | ICD-10-CM

## 2018-11-02 DIAGNOSIS — R2681 Unsteadiness on feet: Secondary | ICD-10-CM

## 2018-11-02 NOTE — Therapy (Signed)
[facility] [address] [place], [place], [address] Phone: [phone]   Fax:  [phone]  Physical Therapy Treatment  Patient Details  Name: Larry Riley MRN: [id number] Date of Birth: August 11, 1980 Referring Provider (PT): Gaspar Cola, MD   Encounter Date: 11/02/2018  PT End of Session - 11/02/18 1800    Visit Number  16    Number of Visits  19    Date for PT Re-Evaluation  11/15/18    Authorization Type  cigna managed - 60 VL    Authorization - Visit Number  15    Authorization - Number of Visits  60    PT Start Time  1230    PT Stop Time  1315    PT Time Calculation (min)  45 min    Activity Tolerance  Patient tolerated treatment well    Behavior During Therapy  [facility] for tasks assessed/performed       Past Medical History:  Diagnosis Date  . GERD (gastroesophageal reflux disease) 01/21/2017  . MRSA (methicillin resistant Staphylococcus aureus) 2005   leg  . Neurofibromatosis    tumor down spine and brain    Past Surgical History:  Procedure Laterality Date  . hydrocephalus     [place]unt [place]  . NECK SURGERY      tumor removal  . SPINE SURGERY  10/2013   c2-t2 fusion  , laminectomy c1-6-- Dr Rigoberto Noel-- [place]  . VASECTOMY  02/2013    There were no vitals filed for this visit.  Subjective Assessment - 11/02/18 1241    Subjective  Pt has not contacted [employer] yet, working on enrollment in insurance benefits for next year.  Still has not changed set up of work station.  Is still planning on contacting [employer] for [employer]. Pt reports pain is a little lower in neck; did some extra stretches.    Pertinent History  PMH: s/p multiple spinal decompression surgeries for schwanomatosis.(2015, 2018)    Patient Stated Goals  gain strength, decreased pain, better gait, get functional e stim (has new insurance so hopes to get this approaved)    Currently in Pain?  No/denies    Pain Onset  More  than a month ago                       [facility] Adult PT Treatment/Exercise - 11/02/18 1303      Lumbar Exercises: Supine   Bridge  Compliant;10 reps    Bridge Limitations  2 sets with feet on therapy ball    Straight Leg Raise  10 reps    Straight Leg Raises Limitations  2 sets with feet on therapy ball    Other Supine Lumbar Exercises  Lower trunk rotations x 10 reps to each side x 2 sets with feet on therapy ball    Other Supine Lumbar Exercises  Sitting on therapy ball performed 1 set x 10 reps of resisted shoulder ER, scap retraction rows and shoulder extension to continue to focus on postural control, sitting balance and UE strengthening.  Also performed 1 set x 10 reps alternating hip flexion marching on ball with min A for trunk control and balance.             PT Education - 11/02/18 1811    Education Details  Discussed pt's current AFO and possible requirements in order to get new AFO - pt's current AFO is 38 years old and was provided to him with  old insurance.  May require physician face to face - will inquire with orthotics company.  Discussed plan for remainder of visits - will begin to assess goals and will hold until receives AFO and will then perform gait training.  Will then plan to D/C and then have pt return for aquatic therapy once he has gym membership to perform aquatic HEP.    Person(s) Educated  Patient    Methods  Explanation    Comprehension  Verbalized understanding          PT Long Term Goals - 10/26/18 0818      PT LONG TERM GOAL #1   Title  The patient will be independent with progression of HEP.    Baseline  The patient has HEP, but is not performing regularly.    Time  6    Period  Weeks    Status  On-going      PT LONG TERM GOAL #2   Title  The patient will verbalize understanding of home office set up for improved mechanics and optimal posture.    Baseline  *Patient has recommendations for home office set up (his desk is high,  so raising chair, using a stool for feet and potentially needing to lower the monitor).    Time  6    Period  Weeks    Status  Achieved      PT LONG TERM GOAL #3   Title  The patient will reduce neck/shoulder pain to 2/10 during last 2 hours of work day (pain worsens t/o the day with fatigue and prolonged sitting).    Time  6    Period  Weeks    Status  On-going      PT LONG TERM GOAL #4   Title  The patient will return demo aquatics exercises for ongoing community wellness plan.    Time  6    Period  Weeks    Status  On-going      PT LONG TERM GOAL #5   Title  The patient's current AFO will be further assessed and he will verbalize understanding of benefits of use for home walking program.    Time  6    Period  Weeks            Plan - 11/02/18 1801    Clinical Impression Statement  Pt continues to show little initiation regarding self management of symptoms with adjustment of work station based on recommendations, seeking membership to gym and contacting [employer] regarding referral.  PT will continue to pursue AFO to improve safety and independence of gait but will likely proceed with D/C within the next 1-2 visits.  Will plan to have pt return to therapy for AFO/gait training and aquatic therapy once he has initiated finding a gym with pool to allow him to perform aquatic HEP.  Continued to perform LE and UE strengthening and postural training incorporating therapy ball for more unstable surface. Will continue to address in order to progress towards LTG.    PT Treatment/Interventions  Electrical Stimulation;Aquatic Therapy;Moist Heat;Ultrasound;Gait training;Functional mobility training;Therapeutic activities;Therapeutic exercise;Balance training;Neuromuscular re-education;Manual techniques;Passive range of motion;Taping;Orthotic Fit/Training;Patient/family education    PT Next Visit Plan  Check goals - will have pt come back once he receives AFO for gait training.  Finalize HEP and  put on hold until receives AFO.  Will then D/C and have pt return if he gets a gym membership for pool HEP.  Did he contact [employer]?  Did he set  up work station?    Consulted and Agree with Plan of Care  Patient       Patient will benefit from skilled therapeutic intervention in order to improve the following deficits and impairments:  Abnormal gait, Decreased activity tolerance, Decreased endurance, Decreased balance, Decreased mobility, Decreased range of motion, Difficulty walking, Hypomobility, Postural dysfunction, Pain, Impaired UE functional use, Improper body mechanics  Visit Diagnosis: Other abnormalities of gait and mobility  Muscle weakness (generalized)  Unsteadiness on feet  Cervicalgia  Other symptoms and signs involving the musculoskeletal system  Abnormal posture     Problem List Patient Active Problem List   Diagnosis Date Noted  . Acute sinus infection 03/09/2017  . External otitis of right ear 03/09/2017  . Thumb laceration, right, initial encounter 03/09/2017  . GERD (gastroesophageal reflux disease) 01/21/2017  . Weakness of left side of body 11/19/2016  . Stress due to illness of family member 06/10/2016  . Syncope 04/23/2016  . Onychomycosis 04/23/2016  . Impacted ear wax 04/23/2016  . Constipation due to opioid therapy 06/13/2014  . Chronic pain syndrome 02/21/2014  . Allergic reaction 02/20/2014  . Neurofibromatosis, type 1 (HCC) 12/14/2013  . Status post cervical arthrodesis 11/21/2013  . Cervical myelopathy (HCC) 09/21/2013  . Preventative health care 06/23/2010  . HORNER'S SYNDROME 04/12/2009  . Other psoriasis 08/24/2007  . Tinea pedis 08/24/2007    Dierdre Highman, PT, DPT 11/02/18    6:14 PM    [facility] [address] [place], [place], [address] Phone: [phone]   Fax:  [phone]  Name: Larry Riley MRN: [id number] Date of Birth: 02/12/1980

## 2018-11-02 NOTE — Patient Instructions (Signed)
Access Code: [email or web address]  URL: [email or web address]  Date: 10/07/2018  Prepared by: Laural Eiland   Exercises Gastroc Stretch on Wall - 10 reps - 3 sets - 1x daily - 7x weekly Squat with Chair Touch - 10 reps - 1 sets - 1x daily - 7x weekly Supine Lower Trunk Rotation - 3 reps - 1 sets - 30 seconds hold - 1x daily - 7x weekly Marching Bridge - 10 reps - 3 sets - 1x daily - 7x weekly Supine Active Straight Leg Raise - 10 reps - 1-3 sets - 2x daily - 6x weekly Prone on Elbows Reach - 10 reps - 1 sets - 3-5 hold - 1x daily - 6x weekly Standing Backward Shoulder Rolls - 10 reps - 3 sets - 1x daily - 7x weekly Seated Cervical Rotation AROM - 10 reps - 3 sets - 1x daily - 7x weekly Seated Cervical Flexion AROM - 10 reps - 3 sets - 1x daily - 7x weekly Seated Cat Camel - 10 reps - 2-3x daily - 7x weekly Seated Thoracic Flexion and Rotation with Arms Crossed - 10 reps - 3 sets - 1x daily - 7x weekly Seated Flexion Stretch - 5 reps - 10 second hold - 2-3x daily - 7x weekly Seated Sidebending - 10 reps - 3 sets - 1x daily - 7x weekly 

## 2018-11-04 ENCOUNTER — Ambulatory Visit: Payer: Managed Care, Other (non HMO) | Admitting: Physical Therapy

## 2018-11-07 ENCOUNTER — Ambulatory Visit: Payer: Managed Care, Other (non HMO) | Admitting: Physical Therapy

## 2018-11-07 ENCOUNTER — Other Ambulatory Visit: Payer: Self-pay

## 2018-11-07 ENCOUNTER — Encounter: Payer: Self-pay | Admitting: Physical Therapy

## 2018-11-07 DIAGNOSIS — R2689 Other abnormalities of gait and mobility: Secondary | ICD-10-CM | POA: Diagnosis not present

## 2018-11-07 DIAGNOSIS — R293 Abnormal posture: Secondary | ICD-10-CM

## 2018-11-07 DIAGNOSIS — R29898 Other symptoms and signs involving the musculoskeletal system: Secondary | ICD-10-CM

## 2018-11-07 DIAGNOSIS — R208 Other disturbances of skin sensation: Secondary | ICD-10-CM

## 2018-11-07 DIAGNOSIS — M6281 Muscle weakness (generalized): Secondary | ICD-10-CM

## 2018-11-07 DIAGNOSIS — R278 Other lack of coordination: Secondary | ICD-10-CM

## 2018-11-07 DIAGNOSIS — R2681 Unsteadiness on feet: Secondary | ICD-10-CM

## 2018-11-07 DIAGNOSIS — M542 Cervicalgia: Secondary | ICD-10-CM

## 2018-11-07 NOTE — Patient Instructions (Signed)
Access Code: [email or web address]  URL: [email or web address]  Date: 10/07/2018  Prepared by: Amory Simonetti   Exercises Gastroc Stretch on Wall - 10 reps - 3 sets - 1x daily - 7x weekly Squat with Chair Touch - 10 reps - 1 sets - 1x daily - 7x weekly Supine Lower Trunk Rotation - 3 reps - 1 sets - 30 seconds hold - 1x daily - 7x weekly Marching Bridge - 10 reps - 3 sets - 1x daily - 7x weekly Supine Active Straight Leg Raise - 10 reps - 1-3 sets - 2x daily - 6x weekly Prone on Elbows Reach - 10 reps - 1 sets - 3-5 hold - 1x daily - 6x weekly Standing Backward Shoulder Rolls - 10 reps - 3 sets - 1x daily - 7x weekly Seated Cervical Rotation AROM - 10 reps - 3 sets - 1x daily - 7x weekly Seated Cervical Flexion AROM - 10 reps - 3 sets - 1x daily - 7x weekly Seated Cat Camel - 10 reps - 2-3x daily - 7x weekly Seated Thoracic Flexion and Rotation with Arms Crossed - 10 reps - 3 sets - 1x daily - 7x weekly Seated Flexion Stretch - 5 reps - 10 second hold - 2-3x daily - 7x weekly Seated Sidebending - 10 reps - 3 sets - 1x daily - 7x weekly 

## 2018-11-08 NOTE — Therapy (Signed)
[facility] [address] [place], [place], [address] Phone: [phone]   Fax:  [phone]  Physical Therapy Treatment  Patient Details  Name: Larry Riley MRN: [id number] Date of Birth: 06-25-1980 Referring Provider (PT): Gaspar Cola, MD   Encounter Date: 11/07/2018  PT End of Session - 11/08/18 1516    Visit Number  17    Number of Visits  19    Date for PT Re-Evaluation  11/15/18    Authorization Type  cigna managed - 60 VL    Authorization - Visit Number  16    Authorization - Number of Visits  60    PT Start Time  1620    PT Stop Time  1702    PT Time Calculation (min)  42 min    Activity Tolerance  Patient tolerated treatment well    Behavior During Therapy  WFL for tasks assessed/performed       Past Medical History:  Diagnosis Date  . GERD (gastroesophageal reflux disease) 01/21/2017  . MRSA (methicillin resistant Staphylococcus aureus) 2005   leg  . Neurofibromatosis    tumor down spine and brain    Past Surgical History:  Procedure Laterality Date  . hydrocephalus     [place]  . NECK SURGERY      tumor removal  . SPINE SURGERY  10/2013   c2-t2 fusion  , laminectomy c1-6-- Dr Rigoberto Noel-- [place]  . VASECTOMY  02/2013    There were no vitals filed for this visit.  Subjective Assessment - 11/07/18 1629    Subjective  "Have you heard anything from [employer]?"  Was a little tired after performing the ball exercises; felt it really worked his core.    Pertinent History  PMH: s/p multiple spinal decompression surgeries for schwanomatosis.(2015, 2018)    Patient Stated Goals  gain strength, decreased pain, better gait, get functional e stim (has new insurance so hopes to get this approaved)    Pain Onset  More than a month ago                       [facility] Adult PT Treatment/Exercise - 11/07/18 1636      Therapeutic Activites    Therapeutic Activities  Other Therapeutic  Activities    Work Simulation  Pt has not contacted [employer]  Pt reports he has cleaned out his office and plans to move his desk around and set up work station based on recommendations next weekend.  Discussed other ways of intentionally incorporating stretch breaks into work day: scheduling in outlook work calendar so pt gets a reminder to perform stretches.  Set up next appointment with PT and orthotist to perform assessment for new AFO.      Knee/Hip Exercises: Aerobic   Stepper  Sci Fit Stepper at level 5 x 10 minutes with bilat UE and LE for aerobic conditioning, UE and LE strengthening          Balance Exercises - 11/07/18 1703      Balance Exercises: Standing   Tandem Gait  Forward;Intermittent upper extremity support;4 reps;Foam/compliant surface   blue balance beam   Sidestepping  Foam/compliant support;Upper extremity support;4 reps   blue balance beam       PT Education - 11/08/18 1515    Education Details  continued to discuss plan for next visit and then to hold until pt receives his AFO; will then perform gait training with new AFO and then D/C until  pt has joined a gym with a pool in order to participate in aquatic therapy    Person(s) Educated  Patient    Methods  Explanation    Comprehension  Verbalized understanding          PT Long Term Goals - 11/07/18 1630      PT LONG TERM GOAL #1   Title  The patient will be independent with progression of HEP.  (LTG due by 11/15/2018)    Baseline  The patient has HEP, but is not performing regularly.    Time  6    Period  Weeks    Status  Achieved      PT LONG TERM GOAL #2   Title  The patient will verbalize understanding of home office set up for improved mechanics and optimal posture.    Baseline  *Patient has recommendations for home office set up (his desk is high, so raising chair, using a stool for feet and potentially needing to lower the monitor). - cleaned out office and is planning on changing set up next  weekend    Time  6    Period  Weeks    Status  On-going      PT LONG TERM GOAL #3   Title  The patient will reduce neck/shoulder pain to 2/10 during last 2 hours of work day (pain worsens t/o the day with fatigue and prolonged sitting).    Baseline  5-6/10; when peforms lunch time stretches pt does report slightly decreased pain at end of day - will try to schedule 15 minute breaks into [employer] Work schedule for stretches    Time  6    Period  Weeks    Status  Partially Met      PT LONG TERM GOAL #4   Title  The patient will return demo aquatics exercises for ongoing community wellness plan.    Baseline  pt has not joined a gym with pool currently so aquatic therapy is on hold    Time  6    Period  Weeks    Status  Deferred      PT LONG TERM GOAL #5   Title  The patient's current AFO will be further assessed and he will verbalize understanding of benefits of use for home walking program.    Time  6    Period  Weeks    Status  On-going            Plan - 11/08/18 1517    Clinical Impression Statement  Pt is slowly incorporating therapy's recommendations for home and work place modification and incorporation of stretch/rest breaks throughout his work day to decrease pain and tension by end of day.  Continued to incorporate aerobic conditioning and balance reaction training into therapy session.  Next therapy session will focus on orthotics consult for new AFO and then will place patient on hold until he receives AFO and then will have pt return for gait training with new AFO.  Pt agreeable to plan.    PT Treatment/Interventions  Electrical Stimulation;Aquatic Therapy;Moist Heat;Ultrasound;Gait training;Functional mobility training;Therapeutic activities;Therapeutic exercise;Balance training;Neuromuscular re-education;Manual techniques;Passive range of motion;Taping;Orthotic Fit/Training;Patient/family education    PT Next Visit Plan  BRIAN - for your session Gerald Stabs from [place] here to  assess pt for most appropriate AFO.  patient's old AFO was [place] ground reaction AFO with medial strut - pt over pronates and has increased pressure on medial malleolus - he also wears ADIDAS without medial arch support.  We tried a posterior leaf spring but he didn't feel as stable and didn't feel comfortable with the rebound of the spring. May need to try a ground reaction AFO with a Lateral strut or try the PLS again.  After orthotic consult pt will be placed on HOLD (not D/C) so he can return for gait training when he receives his new AFO.    Consulted and Agree with Plan of Care  Patient       Patient will benefit from skilled therapeutic intervention in order to improve the following deficits and impairments:  Abnormal gait, Decreased activity tolerance, Decreased endurance, Decreased balance, Decreased mobility, Decreased range of motion, Difficulty walking, Hypomobility, Postural dysfunction, Pain, Impaired UE functional use, Improper body mechanics  Visit Diagnosis: Other abnormalities of gait and mobility  Muscle weakness (generalized)  Unsteadiness on feet  Cervicalgia  Other symptoms and signs involving the musculoskeletal system  Abnormal posture  Other lack of coordination  Other disturbances of skin sensation     Problem List Patient Active Problem List   Diagnosis Date Noted  . Acute sinus infection 03/09/2017  . External otitis of right ear 03/09/2017  . Thumb laceration, right, initial encounter 03/09/2017  . GERD (gastroesophageal reflux disease) 01/21/2017  . Weakness of left side of body 11/19/2016  . Stress due to illness of family member 06/10/2016  . Syncope 04/23/2016  . Onychomycosis 04/23/2016  . Impacted ear wax 04/23/2016  . Constipation due to opioid therapy 06/13/2014  . Chronic pain syndrome 02/21/2014  . Allergic reaction 02/20/2014  . Neurofibromatosis, type 1 ([place]) 12/14/2013  . Status post cervical arthrodesis 11/21/2013  . Cervical  myelopathy ([place]) 09/21/2013  . Preventative health care 06/23/2010  . HORNER'S SYNDROME 04/12/2009  . Other psoriasis 08/24/2007  . Tinea pedis 08/24/2007    Rico Junker, PT, DPT 11/08/18    3:34 PM    [facility] [address] [place], [place], [address] Phone: [phone]   Fax:  [phone]  Name: Larry Riley MRN: [id number] Date of Birth: May 02, 1980

## 2018-11-11 ENCOUNTER — Ambulatory Visit: Payer: Self-pay | Admitting: Physical Therapy

## 2018-11-15 ENCOUNTER — Ambulatory Visit: Payer: Managed Care, Other (non HMO) | Admitting: Physical Therapy

## 2018-11-16 ENCOUNTER — Ambulatory Visit: Payer: Managed Care, Other (non HMO) | Attending: Neurosurgery | Admitting: Physical Therapy

## 2018-11-16 ENCOUNTER — Other Ambulatory Visit: Payer: Self-pay

## 2018-11-16 ENCOUNTER — Other Ambulatory Visit: Payer: Self-pay | Admitting: Internal Medicine

## 2018-11-16 ENCOUNTER — Encounter: Payer: Self-pay | Admitting: Internal Medicine

## 2018-11-16 DIAGNOSIS — G894 Chronic pain syndrome: Secondary | ICD-10-CM

## 2018-11-16 DIAGNOSIS — R209 Unspecified disturbances of skin sensation: Secondary | ICD-10-CM | POA: Diagnosis present

## 2018-11-16 DIAGNOSIS — R293 Abnormal posture: Secondary | ICD-10-CM | POA: Insufficient documentation

## 2018-11-16 DIAGNOSIS — R29898 Other symptoms and signs involving the musculoskeletal system: Secondary | ICD-10-CM | POA: Insufficient documentation

## 2018-11-16 DIAGNOSIS — R278 Other lack of coordination: Secondary | ICD-10-CM | POA: Diagnosis present

## 2018-11-16 DIAGNOSIS — R2681 Unsteadiness on feet: Secondary | ICD-10-CM | POA: Diagnosis present

## 2018-11-16 DIAGNOSIS — M6281 Muscle weakness (generalized): Secondary | ICD-10-CM | POA: Insufficient documentation

## 2018-11-16 DIAGNOSIS — R208 Other disturbances of skin sensation: Secondary | ICD-10-CM

## 2018-11-16 DIAGNOSIS — M542 Cervicalgia: Secondary | ICD-10-CM

## 2018-11-16 DIAGNOSIS — R2689 Other abnormalities of gait and mobility: Secondary | ICD-10-CM | POA: Diagnosis not present

## 2018-11-16 NOTE — Therapy (Addendum)
[name] [address] [place], [place], [address] Phone: [phone]   Fax:  [phone]  Physical Therapy Treatment/Discharge Addendum PHYSICAL THERAPY DISCHARGE SUMMARY  Visits from Start of Care: 18  Current functional level related to goals / functional outcomes: See below   Remaining deficits: See below   Education / Equipment: HEP Plan: Patient agrees to discharge.  Patient goals were partially met. Patient is being discharged due to not returning since the last visit.  ?????  [name], PT, DPT 02/22/19 10:37 AM      Patient Details  Name: Larry Riley MRN: [id number] Date of Birth: 01/04/1981 Referring Provider (PT): Gaspar Cola, MD   Encounter Date: 11/16/2018  PT End of Session - 11/16/18 1519    Visit Number  18    Number of Visits  19    Date for PT Re-Evaluation  11/15/18    Authorization Type  cigna managed - 60 VL    Authorization - Visit Number  100    Authorization - Number of Visits  60    PT Start Time  6767    PT Stop Time  1230    PT Time Calculation (min)  45 min    Activity Tolerance  Patient tolerated treatment well    Behavior During Therapy  [employer] for tasks assessed/performed       Past Medical History:  Diagnosis Date  . GERD (gastroesophageal reflux disease) 01/21/2017  . MRSA (methicillin resistant Staphylococcus aureus) 2005   leg  . Neurofibromatosis    tumor down spine and brain    Past Surgical History:  Procedure Laterality Date  . hydrocephalus     [place]  . NECK SURGERY      tumor removal  . SPINE SURGERY  10/2013   c2-t2 fusion  , laminectomy c1-6-- Dr Rigoberto Noel-- [place]  . VASECTOMY  02/2013    There were no vitals filed for this visit.  Subjective Assessment - 11/16/18 1514    Subjective  Relays he will get new shoes to control pronation after long discussion about benefit of this from PT and orthotist today. No new complaints, agrees to  plan of holding PT until he gets his new AFO    Pertinent History  PMH: s/p multiple spinal decompression surgeries for schwanomatosis.(2015, 2018)    How long can you sit comfortably?  depends    How long can you stand comfortably?  20 min    How long can you walk comfortably?  30 min with brace    Diagnostic tests  CT and MRI    Patient Stated Goals  gain strength, decreased pain, better gait, get functional e stim (has new insurance so hopes to get this approaved)    Pain Onset  More than a month ago                       [facility] Adult PT Treatment/Exercise - 11/16/18 0001      Ambulation/Gait   Ambulation/Gait  Yes    Ambulation/Gait Assistance  6: Modified independent (Device/Increase time)    Gait Comments  walked 5 laps total 115 ft each lap, one lap for orthotist to examine his gait with PT, 2 laps with PLS AFO with strap posterioly, 2 laps with PLS AFO strap in front, and one with ground reaction lateral strut. He appeared to ambulate the best with PLS with anterior strap but due to medial whip and pronation orthotist recommends custom  vector brace with custom foot bed to control pronation more.      Exercises   Other Exercises   Sci fit 3 min L5, and elliptical 3 min (started to aggravate his back) for endurance             PT Education - 11/16/18 1519    Education Details  benefits of custom AFO and motion control shoes to control pronation    Person(s) Educated  Patient    Methods  Explanation    Comprehension  Verbalized understanding          PT Long Term Goals - 11/07/18 1630      PT LONG TERM GOAL #1   Title  The patient will be independent with progression of HEP.  (LTG due by 11/15/2018)    Baseline  The patient has HEP, but is not performing regularly.    Time  6    Period  Weeks    Status  Achieved      PT LONG TERM GOAL #2   Title  The patient will verbalize understanding of home office set up for improved mechanics and optimal  posture.    Baseline  *Patient has recommendations for home office set up (his desk is high, so raising chair, using a stool for feet and potentially needing to lower the monitor). - cleaned out office and is planning on changing set up next weekend    Time  6    Period  Weeks    Status  On-going      PT LONG TERM GOAL #3   Title  The patient will reduce neck/shoulder pain to 2/10 during last 2 hours of work day (pain worsens t/o the day with fatigue and prolonged sitting).    Baseline  5-6/10; when peforms lunch time stretches pt does report slightly decreased pain at end of day - will try to schedule 15 minute breaks into [employer] Work schedule for stretches    Time  6    Period  Weeks    Status  Partially Met      PT LONG TERM GOAL #4   Title  The patient will return demo aquatics exercises for ongoing community wellness plan.    Baseline  pt has not joined a gym with pool currently so aquatic therapy is on hold    Time  6    Period  Weeks    Status  Deferred      PT LONG TERM GOAL #5   Title  The patient's current AFO will be further assessed and he will verbalize understanding of benefits of use for home walking program.    Time  6    Period  Weeks    Status  On-going            Plan - 11/16/18 1520    Clinical Impression Statement  Session today focused on evaluation of gait for new AFO with orthotist Gerald Stabs from [place]. His old AFO has strut medially and he tends to overpronate into the strut. It is also at least 38 years old and does not offer him the proper support/assistance during gait. Gait improved with PLS AFO but due to his pronation will benefit from custom build. This will take about 6 weeks on average to come in so PT recommending holding his PT until he gets the brace. He agrees to this plan and had no further questions or concerns.    Comorbidities  PMH: s/p multiple spinal decompression  surgeries for schwanomatosis.(2015, 2018)    Examination-Activity Limitations   Locomotion Level;Bend;Reach Overhead;Carry;Dressing    Rehab Potential  Good    PT Treatment/Interventions  Electrical Stimulation;Aquatic Therapy;Moist Heat;Ultrasound;Gait training;Functional mobility training;Therapeutic activities;Therapeutic exercise;Balance training;Neuromuscular re-education;Manual techniques;Passive range of motion;Taping;Orthotic Fit/Training;Patient/family education    PT Next Visit Plan  needs re eval or recert after being on hold waiting for AFO.    Consulted and Agree with Plan of Care  Patient       Patient will benefit from skilled therapeutic intervention in order to improve the following deficits and impairments:  Abnormal gait, Decreased activity tolerance, Decreased endurance, Decreased balance, Decreased mobility, Decreased range of motion, Difficulty walking, Hypomobility, Postural dysfunction, Pain, Impaired UE functional use, Improper body mechanics  Visit Diagnosis: Other abnormalities of gait and mobility  Muscle weakness (generalized)  Unsteadiness on feet  Cervicalgia  Other symptoms and signs involving the musculoskeletal system  Abnormal posture  Other lack of coordination  Other disturbances of skin sensation     Problem List Patient Active Problem List   Diagnosis Date Noted  . Acute sinus infection 03/09/2017  . External otitis of right ear 03/09/2017  . Thumb laceration, right, initial encounter 03/09/2017  . GERD (gastroesophageal reflux disease) 01/21/2017  . Weakness of left side of body 11/19/2016  . Stress due to illness of family member 06/10/2016  . Syncope 04/23/2016  . Onychomycosis 04/23/2016  . Impacted ear wax 04/23/2016  . Constipation due to opioid therapy 06/13/2014  . Chronic pain syndrome 02/21/2014  . Allergic reaction 02/20/2014  . Neurofibromatosis, type 1 ([place]) 12/14/2013  . Status post cervical arthrodesis 11/21/2013  . Cervical myelopathy ([place]) 09/21/2013  . Preventative health care 06/23/2010   . HORNER'S SYNDROME 04/12/2009  . Other psoriasis 08/24/2007  . Tinea pedis 08/24/2007    Silvestre Mesi 11/16/2018, 3:25 PM  [facility] [address] [place], [place], [address] Phone: [phone]   Fax:  [phone]  Name: Christien Frankl MRN: [id number] Date of Birth: Mar 07, 1980

## 2018-11-17 NOTE — Telephone Encounter (Signed)
Done erx 

## 2018-12-13 ENCOUNTER — Encounter: Payer: Self-pay | Admitting: Internal Medicine

## 2018-12-13 DIAGNOSIS — Z20828 Contact with and (suspected) exposure to other viral communicable diseases: Secondary | ICD-10-CM

## 2018-12-13 DIAGNOSIS — Z20822 Contact with and (suspected) exposure to covid-19: Secondary | ICD-10-CM

## 2019-03-30 ENCOUNTER — Encounter: Payer: Self-pay | Admitting: Internal Medicine

## 2019-03-30 DIAGNOSIS — G894 Chronic pain syndrome: Secondary | ICD-10-CM

## 2019-03-30 MED ORDER — TRAMADOL HCL 50 MG PO TABS: ORAL_TABLET | ORAL | 2 refills | Status: DC

## 2019-04-01 ENCOUNTER — Other Ambulatory Visit: Payer: Self-pay | Admitting: Internal Medicine

## 2019-05-02 ENCOUNTER — Encounter: Payer: Self-pay | Admitting: Internal Medicine

## 2019-05-02 MED ORDER — SILDENAFIL CITRATE 20 MG PO TABS: ORAL_TABLET | ORAL | 1 refills | Status: DC

## 2019-05-20 ENCOUNTER — Ambulatory Visit: Payer: Managed Care, Other (non HMO)

## 2019-05-27 ENCOUNTER — Ambulatory Visit: Payer: Managed Care, Other (non HMO) | Attending: Internal Medicine

## 2019-06-18 ENCOUNTER — Other Ambulatory Visit: Payer: Self-pay | Admitting: Internal Medicine

## 2019-06-18 DIAGNOSIS — K219 Gastro-esophageal reflux disease without esophagitis: Secondary | ICD-10-CM

## 2019-06-18 NOTE — Telephone Encounter (Signed)
Please refill as per office routine med refill policy (all routine meds refilled for 3 mo or monthly per pt preference up to one year from last visit, then month to month grace period for 3 mo, then further med refills will have to be denied)  

## 2019-07-07 ENCOUNTER — Encounter: Payer: Self-pay | Admitting: Internal Medicine

## 2019-07-07 ENCOUNTER — Other Ambulatory Visit: Payer: Self-pay

## 2019-07-07 ENCOUNTER — Ambulatory Visit (INDEPENDENT_AMBULATORY_CARE_PROVIDER_SITE_OTHER): Payer: Managed Care, Other (non HMO) | Admitting: Internal Medicine

## 2019-07-07 VITALS — BP 134/80 | HR 80 | Temp 98.3°F | Ht 68.0 in | Wt 188.0 lb

## 2019-07-07 DIAGNOSIS — G894 Chronic pain syndrome: Secondary | ICD-10-CM | POA: Diagnosis not present

## 2019-07-07 DIAGNOSIS — Z0000 Encounter for general adult medical examination without abnormal findings: Secondary | ICD-10-CM

## 2019-07-07 DIAGNOSIS — K219 Gastro-esophageal reflux disease without esophagitis: Secondary | ICD-10-CM

## 2019-07-07 MED ORDER — PANTOPRAZOLE SODIUM 40 MG PO TBEC: DELAYED_RELEASE_TABLET | ORAL | 3 refills | Status: DC

## 2019-07-07 MED ORDER — TIZANIDINE HCL 4 MG PO TABS: ORAL_TABLET | ORAL | 5 refills | Status: DC

## 2019-07-07 MED ORDER — TRAMADOL HCL 50 MG PO TABS: ORAL_TABLET | ORAL | 2 refills | Status: DC

## 2019-07-07 MED ORDER — SILDENAFIL CITRATE 100 MG PO TABS: 50.0000 mg | ORAL_TABLET | Freq: Every day | ORAL | 11 refills | Status: DC | PRN

## 2019-07-07 MED ORDER — CELECOXIB 200 MG PO CAPS: 200.0000 mg | ORAL_CAPSULE | Freq: Two times a day (BID) | ORAL | 3 refills | Status: DC

## 2019-07-07 NOTE — Patient Instructions (Signed)
Please continue all other medications as before, and refills have been done if requested.  Please have the pharmacy call with any other refills you may need.  Please continue your efforts at being more active, low cholesterol diet, and weight control.  You are otherwise up to date with prevention measures today.  Please keep your appointments with your specialists as you may have planned  Please go to the LAB at the [facility] for labs to be done  You will be contacted by phone if any changes need to be made immediately.  Otherwise, you will receive a letter about your results with an explanation, but please check with MyChart first.  Please remember to sign up for MyChart if you have not done so, as this will be important to you in the future with finding out test results, communicating by private email, and scheduling acute appointments online when needed.  Please make an Appointment to return for your 1 year visit, or sooner if needed

## 2019-07-08 ENCOUNTER — Encounter: Payer: Self-pay | Admitting: Internal Medicine

## 2019-07-08 NOTE — Assessment & Plan Note (Signed)

## 2019-07-08 NOTE — Progress Notes (Signed)
Subjective:    Patient ID: Larry Riley, male    DOB: 24-Dec-1980, 39 y.o.   MRN: [id number]  HPI  Here for wellness and f/u;  Overall doing ok;  Pt denies Chest pain, worsening SOB, DOE, wheezing, orthopnea, PND, worsening LE edema, palpitations, dizziness or syncope.  Pt denies neurological change such as new headache, facial or extremity weakness.  Pt denies polydipsia, polyuria, or low sugar symptoms. Pt states overall good compliance with treatment and medications, good tolerability, and has been trying to follow appropriate diet.  Pt denies worsening depressive symptoms, suicidal ideation or panic. No fever, night sweats, wt loss, loss of appetite, or other constitutional symptoms.  Pt states good ability with ADL's, has low fall risk, home safety reviewed and adequate, no other significant changes in hearing or vision, and only occasionally active with exercise.  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 GERD (gastroesophageal reflux disease) 01/21/2017  . MRSA (methicillin resistant Staphylococcus aureus) 2005   leg  . Neurofibromatosis    tumor down spine and brain   Past Surgical History:  Procedure Laterality Date  . hydrocephalus     [place]  . NECK SURGERY      tumor removal  . SPINE SURGERY  10/2013   c2-t2 fusion  , laminectomy c1-6-- Dr Rigoberto Noel-- [place]  . VASECTOMY  02/2013    reports that he has never smoked. He has never used smokeless tobacco. He reports current alcohol use of about 3.0 standard drinks of alcohol per week. He reports that he does not use drugs. family history includes Arthritis in his father, paternal grandfather, and paternal grandmother; Diabetes in his mother and paternal grandfather; Healthy in his maternal grandfather and maternal grandmother; Hyperlipidemia in his paternal grandfather; Hypertension in his father and paternal grandfather; Prostate cancer in his father and another family  member. No Known Allergies Current Outpatient Medications on File Prior to Visit  Medication Sig Dispense Refill  . acetaminophen (TYLENOL) 325 MG tablet Take 650 mg by mouth every 6 (six) hours as needed for mild pain, moderate pain, fever or headache.     . bisacodyl (DULCOLAX) 5 MG EC tablet Take 5 mg by mouth daily as needed for mild constipation.     . diphenhydrAMINE (BENADRYL) 25 mg capsule Take 25 mg by mouth at bedtime as needed for allergies or sleep.    Marland Kitchen guaiFENesin (MUCINEX) 600 MG 12 hr tablet Take 600 mg by mouth 2 (two) times daily as needed for cough or to loosen phlegm.    Marland Kitchen LINZESS 145 MCG CAPS capsule Take 145 mcg by mouth every morning.    . sildenafil (REVATIO) 20 MG tablet Take 1-5 tablets by mouth daily as needed. 50 tablet 1  . EPINEPHrine (EPIPEN 2-PAK) 0.3 mg/0.3 mL IJ SOAJ injection Inject 0.3 mLs (0.3 mg total) into the muscle once. (Patient not taking: Reported on 07/07/2019) 1 Device 1  . terbinafine (LAMISIL) 250 MG tablet Take 1 tablet (250 mg total) by mouth daily. (Patient not taking: Reported on 07/07/2019) 90 tablet 0   No current facility-administered medications on file prior to visit.   Review of Systems All otherwise neg per pt     Objective:   Physical Exam BP 134/80 (BP Location: Left Arm, Patient Position: Sitting, Cuff Size: Large)   Pulse 80   Temp 98.3 F (36.8 C) (Oral)   Ht 5\' 8"  (1.727 m)   Wt 188  lb (85.3 kg)   SpO2 98%   BMI 28.59 kg/m  VS noted,  Constitutional: Pt appears in NAD HENT: Head: NCAT.  Right Ear: External ear normal.  Left Ear: External ear normal.  Eyes: . Pupils are equal, round, and reactive to light. Conjunctivae and EOM are normal Nose: without d/c or deformity Neck: Neck supple. Gross normal ROM Cardiovascular: Normal rate and regular rhythm.   Pulmonary/Chest: Effort normal and breath sounds without rales or wheezing.  Abd:  Soft, NT, ND, + BS, no organomegaly Neurological: Pt is alert. At baseline  orientation, motor grossly intact Skin: Skin is warm. No rashes, other new lesions, no LE edema Psychiatric: Pt behavior is normal without agitation  All otherwise neg per pt Lab Results  Component Value Date   WBC 8.0 06/15/2016   HGB 15.1 06/15/2016   HCT 43.6 06/15/2016   PLT 430.0 (H) 06/15/2016   GLUCOSE 85 06/15/2016   CHOL 159 06/15/2016   TRIG 82.0 06/15/2016   HDL 51.60 06/15/2016   LDLCALC 91 06/15/2016   ALT 40 08/21/2016   AST 26 08/21/2016   NA 139 06/15/2016   K 4.1 06/15/2016   CL 105 06/15/2016   CREATININE 0.89 06/15/2016   BUN 18 06/15/2016   CO2 29 06/15/2016   TSH 3.25 06/13/2015          Assessment & Plan:

## 2019-07-08 NOTE — Assessment & Plan Note (Signed)
stable overall by history and exam, recent data reviewed with pt, and pt to continue medical treatment as before,  to f/u any worsening symptoms or concerns  

## 2019-07-27 ENCOUNTER — Other Ambulatory Visit: Payer: Self-pay | Admitting: Internal Medicine

## 2019-07-28 LAB — CBC WITH DIFFERENTIAL/PLATELET
Basophils Absolute: 0.1 10*3/uL (ref 0.0–0.2)
Basos: 1 %
EOS (ABSOLUTE): 0.2 10*3/uL (ref 0.0–0.4)
Eos: 3 %
Hematocrit: 42.2 % (ref 37.5–51.0)
Hemoglobin: 14.2 g/dL (ref 13.0–17.7)
Immature Grans (Abs): 0 10*3/uL (ref 0.0–0.1)
Immature Granulocytes: 0 %
Lymphocytes Absolute: 2.1 10*3/uL (ref 0.7–3.1)
Lymphs: 27 %
MCH: 29.3 pg (ref 26.6–33.0)
MCHC: 33.6 g/dL (ref 31.5–35.7)
MCV: 87 fL (ref 79–97)
Monocytes Absolute: 0.8 10*3/uL (ref 0.1–0.9)
Monocytes: 10 %
Neutrophils Absolute: 4.5 10*3/uL (ref 1.4–7.0)
Neutrophils: 59 %
Platelets: 413 10*3/uL (ref 150–450)
RBC: 4.85 x10E6/uL (ref 4.14–5.80)
RDW: 12.9 % (ref 11.6–15.4)
WBC: 7.7 10*3/uL (ref 3.4–10.8)

## 2019-07-28 LAB — URINALYSIS, ROUTINE W REFLEX MICROSCOPIC
Bilirubin, UA: NEGATIVE
Glucose, UA: NEGATIVE
Ketones, UA: NEGATIVE
Leukocytes,UA: NEGATIVE
Nitrite, UA: NEGATIVE
Protein,UA: NEGATIVE
Specific Gravity, UA: 1.02 (ref 1.005–1.030)
Urobilinogen, Ur: 0.2 mg/dL (ref 0.2–1.0)
pH, UA: 6.5 (ref 5.0–7.5)

## 2019-07-28 LAB — COMPREHENSIVE METABOLIC PANEL
ALT: 42 IU/L (ref 0–44)
AST: 31 IU/L (ref 0–40)
Albumin/Globulin Ratio: 2.1 (ref 1.2–2.2)
Albumin: 4.6 g/dL (ref 4.0–5.0)
Alkaline Phosphatase: 126 IU/L — ABNORMAL HIGH (ref 48–121)
BUN/Creatinine Ratio: 17 (ref 9–20)
BUN: 16 mg/dL (ref 6–20)
Bilirubin Total: 0.4 mg/dL (ref 0.0–1.2)
CO2: 24 mmol/L (ref 20–29)
Calcium: 9.7 mg/dL (ref 8.7–10.2)
Chloride: 104 mmol/L (ref 96–106)
Creatinine, Ser: 0.95 mg/dL (ref 0.76–1.27)
GFR calc Af Amer: 116 mL/min/{1.73_m2} (ref >59)
GFR calc non Af Amer: 100 mL/min/{1.73_m2} (ref >59)
Globulin, Total: 2.2 g/dL (ref 1.5–4.5)
Glucose: 94 mg/dL (ref 65–99)
Potassium: 4.5 mmol/L (ref 3.5–5.2)
Sodium: 139 mmol/L (ref 134–144)
Total Protein: 6.8 g/dL (ref 6.0–8.5)

## 2019-07-28 LAB — LIPID PANEL W/O CHOL/HDL RATIO
Cholesterol, Total: 159 mg/dL (ref 100–199)
HDL: 48 mg/dL (ref >39)
LDL Chol Calc (NIH): 98 mg/dL (ref 0–99)
Triglycerides: 65 mg/dL (ref 0–149)
VLDL Cholesterol Cal: 13 mg/dL (ref 5–40)

## 2019-07-28 LAB — MICROSCOPIC EXAMINATION
Bacteria, UA: NONE SEEN
Casts: NONE SEEN /LPF
Epithelial Cells (non renal): NONE SEEN /HPF (ref 0–10)
WBC, UA: NONE SEEN /HPF (ref 0–5)

## 2019-07-28 LAB — SPECIMEN STATUS REPORT

## 2019-07-31 ENCOUNTER — Other Ambulatory Visit: Payer: Self-pay | Admitting: Internal Medicine

## 2019-07-31 ENCOUNTER — Encounter: Payer: Self-pay | Admitting: Internal Medicine

## 2019-07-31 DIAGNOSIS — R3129 Other microscopic hematuria: Secondary | ICD-10-CM

## 2019-09-24 ENCOUNTER — Encounter: Payer: Self-pay | Admitting: Internal Medicine

## 2019-10-03 MED ORDER — SILDENAFIL CITRATE 20 MG PO TABS: ORAL_TABLET | ORAL | 5 refills | Status: DC

## 2019-10-03 NOTE — Addendum Note (Signed)
Addended by: Corwin Levins on: 10/03/2019 05:36 PM   Modules accepted: Orders

## 2019-10-25 ENCOUNTER — Encounter: Payer: Self-pay | Admitting: Internal Medicine

## 2019-11-06 ENCOUNTER — Other Ambulatory Visit: Payer: Self-pay | Admitting: Internal Medicine

## 2019-12-19 ENCOUNTER — Encounter: Payer: Self-pay | Admitting: Internal Medicine

## 2019-12-19 DIAGNOSIS — G894 Chronic pain syndrome: Secondary | ICD-10-CM

## 2019-12-20 MED ORDER — TRAMADOL HCL 50 MG PO TABS: ORAL_TABLET | ORAL | 2 refills | Status: DC

## 2020-02-06 ENCOUNTER — Encounter: Payer: Self-pay | Admitting: Internal Medicine

## 2020-02-06 NOTE — Telephone Encounter (Signed)
Ok to let pt know, only one pill per 24 hrs is the usual dosing, and taking more than prescribed is not recommended or helpful.  The refill would therefore be too soon I think

## 2020-02-08 MED ORDER — SILDENAFIL CITRATE 20 MG PO TABS: ORAL_TABLET | ORAL | 5 refills | Status: DC

## 2020-02-09 ENCOUNTER — Telehealth: Payer: Self-pay

## 2020-02-09 NOTE — Telephone Encounter (Signed)
Prior authorization for [employer]: [id number]

## 2020-04-29 ENCOUNTER — Encounter: Payer: Self-pay | Admitting: Internal Medicine

## 2020-04-29 DIAGNOSIS — G894 Chronic pain syndrome: Secondary | ICD-10-CM

## 2020-05-01 MED ORDER — TRAMADOL HCL 50 MG PO TABS: ORAL_TABLET | ORAL | 2 refills | Status: DC

## 2020-05-09 ENCOUNTER — Other Ambulatory Visit: Payer: Self-pay | Admitting: Internal Medicine

## 2020-05-28 ENCOUNTER — Other Ambulatory Visit: Payer: Self-pay | Admitting: Internal Medicine

## 2020-05-28 DIAGNOSIS — K219 Gastro-esophageal reflux disease without esophagitis: Secondary | ICD-10-CM

## 2020-05-28 NOTE — Telephone Encounter (Signed)
Please refill as per office routine med refill policy (all routine meds refilled for 3 mo or monthly per pt preference up to one year from last visit, then month to month grace period for 3 mo, then further med refills will have to be denied)  

## 2020-08-01 ENCOUNTER — Other Ambulatory Visit: Payer: Self-pay

## 2020-08-01 ENCOUNTER — Encounter: Payer: Self-pay | Admitting: Internal Medicine

## 2020-08-01 ENCOUNTER — Ambulatory Visit (INDEPENDENT_AMBULATORY_CARE_PROVIDER_SITE_OTHER): Payer: Managed Care, Other (non HMO) | Admitting: Internal Medicine

## 2020-08-01 VITALS — BP 126/70 | HR 70 | Temp 98.2°F | Resp 18 | Ht 68.0 in | Wt 192.4 lb

## 2020-08-01 DIAGNOSIS — Z0001 Encounter for general adult medical examination with abnormal findings: Secondary | ICD-10-CM | POA: Diagnosis not present

## 2020-08-01 DIAGNOSIS — G894 Chronic pain syndrome: Secondary | ICD-10-CM

## 2020-08-01 DIAGNOSIS — R3129 Other microscopic hematuria: Secondary | ICD-10-CM

## 2020-08-01 DIAGNOSIS — Z0000 Encounter for general adult medical examination without abnormal findings: Secondary | ICD-10-CM

## 2020-08-01 NOTE — Progress Notes (Signed)
Patient ID: Larry Riley, male   DOB: 04-07-1980, 40 y.o.   MRN: [id number]         Chief Complaint:: wellness exam and Annual Exam (No concerns at this time. Does labs thru [facility]. )         HPI:  Larry Riley is a 40 y.o. male here for wellness exam; declines covid vax , pneumovax, hep c screen, o/w up to date with preventive referral and immunizations.                          Also s/p covid infection late may 2022.  Denies urinary symptoms such as dysuria, frequency, urgency, flank pain, hematuria or n/v, fever, chills.  Pt denies chest pain, increased sob or doe, wheezing, orthopnea, PND, increased LE swelling, palpitations, dizziness or syncope.   Pt denies polydipsia, polyuria, or new focal neuro s/s.   Pt denies fever, wt loss, night sweats, loss of appetite, or other constitutional symptoms     Wt Readings from Last 3 Encounters:  08/01/20 192 lb 6.4 oz (87.3 kg)  07/07/19 188 lb (85.3 kg)  12/16/17 201 lb 0.6 oz (91.2 kg)   BP Readings from Last 3 Encounters:  08/01/20 126/70  07/07/19 134/80  12/16/17 134/78   Immunization History  Administered Date(s) Administered   Influenza Inj Mdck Quad Pf 10/14/2018   Influenza,inj,Quad PF,6+ Mos 11/28/2012   Influenza-Unspecified 12/14/2013, 10/03/2016, 10/26/2016   PFIZER(Purple Top)SARS-COV-2 Vaccination 06/23/2019, 07/13/2019   Tdap 12/11/2011   There are no preventive care reminders to display for this patient.     Past Medical History:  Diagnosis Date   GERD (gastroesophageal reflux disease) 01/21/2017   MRSA (methicillin resistant Staphylococcus aureus) 2005   leg   Neurofibromatosis    tumor down spine and brain   Past Surgical History:  Procedure Laterality Date   hydrocephalus     [place]unt 1999[place]   NECK SURGERY      tumor removal   SPINE SURGERY  10/2013   c2-t2 fusion  , laminectomy c1-6-- Dr Raynald Kemp-- [facility]   VASECTOMY  02/2013    reports that he has never smoked. He has never used smokeless tobacco. He  reports current alcohol use of about 3.0 standard drinks of alcohol per week. He reports that he does not use drugs. family history includes Arthritis in his father, paternal grandfather, and paternal grandmother; Diabetes in his mother and paternal grandfather; Healthy in his maternal grandfather and maternal grandmother; Hyperlipidemia in his paternal grandfather; Hypertension in his father and paternal grandfather; Prostate cancer in his father and another family member. No Known Allergies Current Outpatient Medications on File Prior to Visit  Medication Sig Dispense Refill   acetaminophen (TYLENOL) 325 MG tablet Take 650 mg by mouth every 6 (six) hours as needed for mild pain, moderate pain, fever or headache.      bisacodyl (DULCOLAX) 5 MG EC tablet Take 5 mg by mouth daily as needed for mild constipation.      celecoxib (CELEBREX) 200 MG capsule Take 1 capsule (200 mg total) by mouth 2 (two) times daily. 180 capsule 3   diphenhydrAMINE (BENADRYL) 25 mg capsule Take 25 mg by mouth at bedtime as needed for allergies or sleep.     guaiFENesin (MUCINEX) 600 MG 12 hr tablet Take 600 mg by mouth 2 (two) times daily as needed for cough or to loosen phlegm.     LINZESS 145 MCG CAPS capsule Take 145 mcg by mouth  every morning.     pantoprazole (PROTONIX) 40 MG tablet TAKE 1 TABLET(40 MG) BY MOUTH DAILY 30 tablet 0   sildenafil (REVATIO) 20 MG tablet Take 1-5 tablets by mouth daily as needed. 50 tablet 5   tiZANidine (ZANAFLEX) 4 MG tablet TAKE 1 TABLET(4 MG) BY MOUTH EVERY 6 HOURS AS NEEDED FOR MUSCLE SPASMS 120 tablet 5   traMADol (ULTRAM) 50 MG tablet TAKE ONE TABLET BY MOUTH EVERY 8 HOURS AS NEEDED (OFFICE VISIT FOR MORE REFILLS). 90 tablet 2   EPINEPHrine (EPIPEN 2-PAK) 0.3 mg/0.3 mL IJ SOAJ injection Inject 0.3 mLs (0.3 mg total) into the muscle once. (Patient not taking: No sig reported) 1 Device 1   terbinafine (LAMISIL) 250 MG tablet Take 1 tablet (250 mg total) by mouth daily. (Patient not  taking: No sig reported) 90 tablet 0   No current facility-administered medications on file prior to visit.        ROS:  All others reviewed and negative.  Objective        PE:  BP 126/70   Pulse 70   Temp 98.2 F (36.8 C) (Oral)   Resp 18   Ht 5\' 8"  (1.727 m)   Wt 192 lb 6.4 oz (87.3 kg)   SpO2 96%   BMI 29.25 kg/m                 Constitutional: Pt appears in NAD               HENT: Head: NCAT.                Right Ear: External ear normal.                 Left Ear: External ear normal.                Eyes: . Pupils are equal, round, and reactive to light. Conjunctivae and EOM are normal               Nose: without d/c or deformity               Neck: Neck supple. Gross normal ROM               Cardiovascular: Normal rate and regular rhythm.                 Pulmonary/Chest: Effort normal and breath sounds without rales or wheezing.                Abd:  Soft, NT, ND, + BS, no organomegaly               Neurological: Pt is alert. At baseline orientation, motor grossly intact               Skin: Skin is warm. No rashes, no other new lesions, LE edema - none               Psychiatric: Pt behavior is normal without agitation   Micro: none  Cardiac tracings I have personally interpreted today:  none  Pertinent Radiological findings (summarize): none   Lab Results  Component Value Date   WBC 7.7 07/27/2019   HGB 14.2 07/27/2019   HCT 42.2 07/27/2019   PLT 413 07/27/2019   GLUCOSE 94 07/27/2019   CHOL 159 07/27/2019   TRIG 65 07/27/2019   HDL 48 07/27/2019   LDLCALC 98 07/27/2019   ALT 42 07/27/2019   AST 31 07/27/2019   NA  139 07/27/2019   K 4.5 07/27/2019   CL 104 07/27/2019   CREATININE 0.95 07/27/2019   BUN 16 07/27/2019   CO2 24 07/27/2019   TSH 3.25 06/13/2015   Assessment/Plan:  Larry Riley is a 40 y.o. White or Caucasian [1] male with  has a past medical history of GERD (gastroesophageal reflux disease) (01/21/2017), MRSA (methicillin resistant  Staphylococcus aureus) (2005), and Neurofibromatosis.  Preventative health care Age and sex appropriate education and counseling updated with regular exercise and diet Referrals for preventative services - declines hep c screen Immunizations addressed - declines covid vax and pneumovax Smoking counseling  - none needed Evidence for depression or other mood disorder - none significant Most recent labs reviewed. I have personally reviewed and have noted: 1) the patient's medical and social history 2) The patient's current medications and supplements 3) The patient's height, weight, and BMI have been recorded in the chart   Microhematuria Chronic persistent, asympt, etiology unclear, declines urology referral  Followup: No follow-ups on file.  Oliver Barre, MD 08/03/2020 8:17 PM [facility]

## 2020-08-01 NOTE — Patient Instructions (Signed)
Please continue all other medications as before, and refills have been done if requested.  Please have the pharmacy call with any other refills you may need.  Please continue your efforts at being more active, low cholesterol diet, and weight control.  You are otherwise up to date with prevention measures today.  Please keep your appointments with your specialists as you may have planned  Please have the lab testing done at [facility] as you mentioned  You will be contacted by phone if any changes need to be made immediately.  Otherwise, you will receive a letter about your results with an explanation, but please check with MyChart first.  Please remember to sign up for MyChart if you have not done so, as this will be important to you in the future with finding out test results, communicating by private email, and scheduling acute appointments online when needed.  Please make an Appointment to return for your 1 year visit, or sooner if needed, with Lab testing by Appointment as well, to be done about 3-5 days before at the FIRST FLOOR Lab (so this is for TWO appointments - please see the scheduling desk as you leave)  Due to the ongoing Covid 19 pandemic, our lab now requires an appointment for any labs done at our office.  If you need labs done and do not have an appointment, please call our office ahead of time to schedule before presenting to the lab for your testing.

## 2020-08-03 DIAGNOSIS — R3129 Other microscopic hematuria: Secondary | ICD-10-CM | POA: Insufficient documentation

## 2020-08-03 NOTE — Assessment & Plan Note (Signed)
Age and sex appropriate education and counseling updated with regular exercise and diet Referrals for preventative services - declines hep c screen Immunizations addressed - declines covid vax and pneumovax Smoking counseling  - none needed Evidence for depression or other mood disorder - none significant Most recent labs reviewed. I have personally reviewed and have noted: 1) the patient's medical and social history 2) The patient's current medications and supplements 3) The patient's height, weight, and BMI have been recorded in the chart

## 2020-08-03 NOTE — Assessment & Plan Note (Signed)
Chronic persistent, asympt, etiology unclear, declines urology referral

## 2020-08-15 ENCOUNTER — Encounter: Payer: Self-pay | Admitting: Internal Medicine

## 2020-08-16 ENCOUNTER — Encounter (HOSPITAL_BASED_OUTPATIENT_CLINIC_OR_DEPARTMENT_OTHER): Payer: Self-pay

## 2020-08-16 ENCOUNTER — Encounter: Payer: Self-pay | Admitting: Internal Medicine

## 2020-08-16 ENCOUNTER — Other Ambulatory Visit: Payer: Self-pay

## 2020-08-16 ENCOUNTER — Ambulatory Visit: Payer: Managed Care, Other (non HMO) | Admitting: Internal Medicine

## 2020-08-16 ENCOUNTER — Emergency Department (HOSPITAL_BASED_OUTPATIENT_CLINIC_OR_DEPARTMENT_OTHER)
Admission: EM | Admit: 2020-08-16 | Discharge: 2020-08-17 | Disposition: A | Discharge status: Home / Self Care | Payer: Managed Care, Other (non HMO) | Attending: Emergency Medicine | Admitting: Emergency Medicine

## 2020-08-16 ENCOUNTER — Emergency Department (HOSPITAL_BASED_OUTPATIENT_CLINIC_OR_DEPARTMENT_OTHER): Payer: Managed Care, Other (non HMO) | Admitting: Radiology

## 2020-08-16 DIAGNOSIS — L03115 Cellulitis of right lower limb: Secondary | ICD-10-CM

## 2020-08-16 DIAGNOSIS — X58XXXA Exposure to other specified factors, initial encounter: Secondary | ICD-10-CM | POA: Insufficient documentation

## 2020-08-16 DIAGNOSIS — L02611 Cutaneous abscess of right foot: Secondary | ICD-10-CM

## 2020-08-16 DIAGNOSIS — S90851A Superficial foreign body, right foot, initial encounter: Secondary | ICD-10-CM | POA: Diagnosis present

## 2020-08-16 DIAGNOSIS — B999 Unspecified infectious disease: Secondary | ICD-10-CM

## 2020-08-16 DIAGNOSIS — K219 Gastro-esophageal reflux disease without esophagitis: Secondary | ICD-10-CM

## 2020-08-16 LAB — CBC WITH DIFFERENTIAL/PLATELET
Abs Immature Granulocytes: 0.04 10*3/uL (ref 0.00–0.07)
Basophils Absolute: 0.1 10*3/uL (ref 0.0–0.1)
Basophils Relative: 1 %
Eosinophils Absolute: 0.3 10*3/uL (ref 0.0–0.5)
Eosinophils Relative: 3 %
HCT: 39.5 % (ref 39.0–52.0)
Hemoglobin: 13.7 g/dL (ref 13.0–17.0)
Immature Granulocytes: 1 %
Lymphocytes Relative: 25 %
Lymphs Abs: 2.1 10*3/uL (ref 0.7–4.0)
MCH: 30.1 pg (ref 26.0–34.0)
MCHC: 34.7 g/dL (ref 30.0–36.0)
MCV: 86.8 fL (ref 80.0–100.0)
Monocytes Absolute: 0.8 10*3/uL (ref 0.1–1.0)
Monocytes Relative: 9 %
Neutro Abs: 5.2 10*3/uL (ref 1.7–7.7)
Neutrophils Relative %: 61 %
Platelets: 437 10*3/uL — ABNORMAL HIGH (ref 150–400)
RBC: 4.55 MIL/uL (ref 4.22–5.81)
RDW: 13.4 % (ref 11.5–15.5)
WBC: 8.4 10*3/uL (ref 4.0–10.5)
nRBC: 0 % (ref 0.0–0.2)

## 2020-08-16 LAB — COMPREHENSIVE METABOLIC PANEL
ALT: 77 U/L — ABNORMAL HIGH (ref 0–44)
AST: 30 U/L (ref 15–41)
Albumin: 4.1 g/dL (ref 3.5–5.0)
Alkaline Phosphatase: 201 U/L — ABNORMAL HIGH (ref 38–126)
Anion gap: 8 (ref 5–15)
BUN: 20 mg/dL (ref 6–20)
CO2: 28 mmol/L (ref 22–32)
Calcium: 9.6 mg/dL (ref 8.9–10.3)
Chloride: 104 mmol/L (ref 98–111)
Creatinine, Ser: 0.81 mg/dL (ref 0.61–1.24)
GFR, Estimated: >60 mL/min (ref >60)
Glucose, Bld: 107 mg/dL — ABNORMAL HIGH (ref 70–99)
Potassium: 4.1 mmol/L (ref 3.5–5.1)
Sodium: 140 mmol/L (ref 135–145)
Total Bilirubin: 0.3 mg/dL (ref 0.3–1.2)
Total Protein: 7.1 g/dL (ref 6.5–8.1)

## 2020-08-16 LAB — LACTIC ACID, PLASMA: Lactic Acid, Venous: 0.8 mmol/L (ref 0.5–1.9)

## 2020-08-16 MED ORDER — LIDOCAINE HCL (PF) 1 % IJ SOLN
30.0000 mL | Freq: Once | INTRAMUSCULAR | Status: AC
  Administered 2020-08-16: 30 mL
  Filled 2020-08-16: qty 30

## 2020-08-16 MED ORDER — VANCOMYCIN HCL 1750 MG/350ML IV SOLN
1750.0000 mg | Freq: Once | INTRAVENOUS | Status: AC
Start: 2020-08-16 — End: 2020-08-17
  Administered 2020-08-16: 1750 mg via INTRAVENOUS
  Filled 2020-08-16: qty 350

## 2020-08-16 NOTE — Assessment & Plan Note (Signed)
Stable overall, cont current PPI,  to f/u any worsening symptoms or concerns

## 2020-08-16 NOTE — Progress Notes (Signed)
Patient ID: Larry Riley, male   DOB: 1980/09/22, 40 y.o.   MRN: [id number]        Chief Complaint: follow up right foot infection       HPI:  Larry Riley is a 40 y.o. male here with c/o right foot red, pain swelling to the dorsum with out fever, chills red streaks or drainage, but admits he has not looked at the plantar aspect of the foot, now realizes he has very large single abscess over the 2nd and 3rd MTP area.  Has hx of MRSA abscess with sepsis  to post left calf years ago with prolonged IV antbx.  Pt denies chest pain, increased sob or doe, wheezing, orthopnea, PND, increased LE swelling, palpitations, dizziness or syncope.  Pt denies polydipsia, polyuria, or new focal neuro s/s.  No hx of DM   Denies worsening reflux, abd pain, dysphagia, n/v, bowel change or blood.       Wt Readings from Last 3 Encounters:  08/16/20 190 lb (86.2 kg)  08/16/20 191 lb (86.6 kg)  08/01/20 192 lb 6.4 oz (87.3 kg)   BP Readings from Last 3 Encounters:  08/16/20 (!) 148/95  08/16/20 120/70  08/01/20 126/70         Past Medical History:  Diagnosis Date   GERD (gastroesophageal reflux disease) 01/21/2017   MRSA (methicillin resistant Staphylococcus aureus) 2005   leg   Neurofibromatosis    tumor down spine and brain   Past Surgical History:  Procedure Laterality Date   hydrocephalus     [place]unt 1999[place]   NECK SURGERY      tumor removal   SPINE SURGERY  10/2013   c2-t2 fusion  , laminectomy c1-6-- Dr Raynald Kemp-- [facility]   VASECTOMY  02/2013    reports that he has never smoked. He has never used smokeless tobacco. He reports current alcohol use of about 3.0 standard drinks of alcohol per week. He reports that he does not use drugs. family history includes Arthritis in his father, paternal grandfather, and paternal grandmother; Diabetes in his mother and paternal grandfather; Healthy in his maternal grandfather and maternal grandmother; Hyperlipidemia in his paternal grandfather; Hypertension in his  father and paternal grandfather; Prostate cancer in his father and another family member. No Known Allergies Current Outpatient Medications on File Prior to Visit  Medication Sig Dispense Refill   acetaminophen (TYLENOL) 325 MG tablet Take 650 mg by mouth every 6 (six) hours as needed for mild pain, moderate pain, fever or headache.      bisacodyl (DULCOLAX) 5 MG EC tablet Take 5 mg by mouth daily as needed for mild constipation.      celecoxib (CELEBREX) 200 MG capsule Take 1 capsule (200 mg total) by mouth 2 (two) times daily. 180 capsule 3   diphenhydrAMINE (BENADRYL) 25 mg capsule Take 25 mg by mouth at bedtime as needed for allergies or sleep.     EPINEPHrine (EPIPEN 2-PAK) 0.3 mg/0.3 mL IJ SOAJ injection Inject 0.3 mLs (0.3 mg total) into the muscle once. 1 Device 1   guaiFENesin (MUCINEX) 600 MG 12 hr tablet Take 600 mg by mouth 2 (two) times daily as needed for cough or to loosen phlegm.     LINZESS 145 MCG CAPS capsule Take 145 mcg by mouth every morning.     pantoprazole (PROTONIX) 40 MG tablet TAKE 1 TABLET(40 MG) BY MOUTH DAILY 30 tablet 0   sildenafil (REVATIO) 20 MG tablet Take 1-5 tablets by mouth daily as needed. 50 tablet  5   terbinafine (LAMISIL) 250 MG tablet Take 1 tablet (250 mg total) by mouth daily. 90 tablet 0   tiZANidine (ZANAFLEX) 4 MG tablet TAKE 1 TABLET(4 MG) BY MOUTH EVERY 6 HOURS AS NEEDED FOR MUSCLE SPASMS 120 tablet 5   traMADol (ULTRAM) 50 MG tablet TAKE ONE TABLET BY MOUTH EVERY 8 HOURS AS NEEDED (OFFICE VISIT FOR MORE REFILLS). 90 tablet 2   No current facility-administered medications on file prior to visit.        ROS:  All others reviewed and negative.  Objective        PE:  BP 120/70 (BP Location: Left Arm, Patient Position: Sitting, Cuff Size: Normal)   Pulse 79   Temp 98 F (36.7 C) (Oral)   Ht 5\' 8"  (1.727 m)   Wt 191 lb (86.6 kg)   SpO2 98%   BMI 29.04 kg/m                 Constitutional: Pt appears in NAD               HENT: Head: NCAT.                 Right Ear: External ear normal.                 Left Ear: External ear normal.                Eyes: . Pupils are equal, round, and reactive to light. Conjunctivae and EOM are normal               Nose: without d/c or deformity               Neck: Neck supple. Gross normal ROM               Cardiovascular: Normal rate and regular rhythm.                 Pulmonary/Chest: Effort normal and breath sounds without rales or wheezing.                Abd:  Soft, NT, ND, + BS, no organomegaly               Neurological: Pt is alert. At baseline orientation               Skin: LE edema - none; right foot with dorsal red, tender swelling worst at the distal foot over the 2nd/3rd MTP with extension to over the mid foot, but also with large approx 1.5 cm abscess at the plantar aspect same, overall o/w neurovasc intact                Psychiatric: Pt behavior is normal without agitation   Micro: none  Cardiac tracings I have personally interpreted today:  none  Pertinent Radiological findings (summarize): none   Lab Results  Component Value Date   WBC 7.7 07/27/2019   HGB 14.2 07/27/2019   HCT 42.2 07/27/2019   PLT 413 07/27/2019   GLUCOSE 94 07/27/2019   CHOL 159 07/27/2019   TRIG 65 07/27/2019   HDL 48 07/27/2019   LDLCALC 98 07/27/2019   ALT 42 07/27/2019   AST 31 07/27/2019   NA 139 07/27/2019   K 4.5 07/27/2019   CL 104 07/27/2019   CREATININE 0.95 07/27/2019   BUN 16 07/27/2019   CO2 24 07/27/2019   TSH 3.25 06/13/2015   Assessment/Plan:  Larry Riley is a 40  y.o. [name] or Caucasian [1] male with  has a past medical history of GERD (gastroesophageal reflux disease) (01/21/2017), MRSA (methicillin resistant Staphylococcus aureus) (2005), and Neurofibromatosis.  Abscess of right foot Has obvious large abscess to plantar aspect right foot over the 2nd/3rd MTP area, which I suspect may extend through the foot to the dorsal aspect same area; needs plain film  And MRI to  assess for osteo with labs, cultures, ortho consult - pt asked to go to ED now  Cellulitis of right foot Starting distal dorsal foot with extension over the dorsal mid foot; no evidence for sepis, but given severity and abscess needs urgent ED evaluation now, will need IV antibx with coverage including MRSA given hx  Gastroesophageal reflux disease Stable overall, cont current PPI,  to f/u any worsening symptoms or concerns  Followup: Return if symptoms worsen or fail to improve.  Oliver Barre, MD 08/16/2020 8:30 PM [facility]

## 2020-08-16 NOTE — Patient Instructions (Signed)
Please to go to [facility] ED on [facility] as it appears you have a right foot abscess  Please continue all other medications as before, and refills have been done if requested.  Please have the pharmacy call with any other refills you may need.  Please continue your efforts at being more active, low cholesterol diet, and weight control.  Please keep your appointments with your specialists as you may have planned

## 2020-08-16 NOTE — Assessment & Plan Note (Signed)
Has obvious large abscess to plantar aspect right foot over the 2nd/3rd MTP area, which I suspect may extend through the foot to the dorsal aspect same area; needs plain film  And MRI to assess for osteo with labs, cultures, ortho consult - pt asked to go to ED now

## 2020-08-16 NOTE — ED Provider Notes (Addendum)
[facility]   CSN: [id number] Arrival date & time: 08/16/20  1842     History Chief Complaint  Patient presents with   Abscess    Larry HammondJoshua Riley is a 40 y.o. male.  Patient presents to the emergency department for evaluation of an infection in his right foot.  Patient reports that symptoms have been present for 3 days.  Patient reports progressive swelling, redness without drainage.  He is unaware of any injury.  He told the nurse that he might of stepped on something, but he does not recall any specific event.  He did have a similar infection in 2006 that was more severe, including bacteremia.  Patient has not had any fever.  He spent some time soaking his foot in Epson salts and it does seem to have improved.      Past Medical History:  Diagnosis Date   GERD (gastroesophageal reflux disease) 01/21/2017   MRSA (methicillin resistant Staphylococcus aureus) 2005   leg   Neurofibromatosis    tumor down spine and brain    Patient Active Problem List   Diagnosis Date Noted   Abscess of right foot 08/16/2020   Cellulitis of right foot 08/16/2020   Microhematuria 08/03/2020   Gastroesophageal reflux disease 07/07/2019   External otitis of right ear 03/09/2017   Thumb laceration, right, initial encounter 03/09/2017   Weakness of left side of body 11/19/2016   Stress due to illness of family member 06/10/2016   Syncope 04/23/2016   Onychomycosis 04/23/2016   Impacted ear wax 04/23/2016   Constipation due to opioid therapy 06/13/2014   Chronic pain syndrome 02/21/2014   Allergic reaction 02/20/2014   Neurofibromatosis, type 1 (HCC) 12/14/2013   Status post cervical arthrodesis 11/21/2013   Cervical myelopathy (HCC) 09/21/2013   Preventative health care 06/23/2010   HORNER'S SYNDROME 04/12/2009   Other psoriasis 08/24/2007   Tinea pedis 08/24/2007    Past Surgical History:  Procedure Laterality Date   hydrocephalus     shunt  1999-[place]   NECK SURGERY      tumor removal   SPINE SURGERY  10/2013   c2-t2 fusion  , laminectomy c1-6-- Dr [facility]nald [facility]-- [facility]   VASECTOMY  02/2013       Family History  Problem Relation Age of Onset   Diabetes Mother    Arthritis Father    Hypertension Father    Prostate cancer Father    Arthritis Paternal Grandmother    Arthritis Paternal Grandfather    Diabetes Paternal Grandfather    Hyperlipidemia Paternal Grandfather    Hypertension Paternal Grandfather    Healthy Maternal Grandmother    Healthy Maternal Grandfather    Prostate cancer Other     Social History   Tobacco Use   Smoking status: Never   Smokeless tobacco: Never  Vaping Use   Vaping Use: Never used  Substance Use Topics   Alcohol use: Yes    Alcohol/week: 3.0 standard drinks    Types: 3 Glasses of wine per week   Drug use: No    Home Medications Prior to Admission medications   Medication Sig Start Date End Date Taking? Authorizing Provider  amoxicillin-clavulanate (AUGMENTIN) 875-125 MG tablet Take 1 tablet by mouth 2 (two) times daily. 08/17/20  Yes Lillien Petronio, Canary Brimhristopher J, MD  HYDROcodone-acetaminophen (NORCO/VICODIN) 5-325 MG tablet Take 1 tablet by mouth every 4 (four) hours as needed for moderate pain. 08/17/20  Yes Kenzlie Disch, Canary Brimhristopher J, MD  sulfamethoxazole-trimethoprim (BACTRIM DS) 800-160 MG tablet Take  1 tablet by mouth 2 (two) times daily. 08/17/20  Yes Jamaira Sherk, Canary Brim, MD  acetaminophen (TYLENOL) 325 MG tablet Take 650 mg by mouth every 6 (six) hours as needed for mild pain, moderate pain, fever or headache.     [provider]  bisacodyl (DULCOLAX) 5 MG EC tablet Take 5 mg by mouth daily as needed for mild constipation.     [provider]  celecoxib (CELEBREX) 200 MG capsule Take 1 capsule (200 mg total) by mouth 2 (two) times daily. 07/07/19   Corwin Levins, MD  diphenhydrAMINE (BENADRYL) 25 mg capsule Take 25 mg by mouth at bedtime as needed for allergies or sleep.     [provider]  EPINEPHrine (EPIPEN 2-PAK) 0.3 mg/0.3 mL IJ SOAJ injection Inject 0.3 mLs (0.3 mg total) into the muscle once. 02/20/14   Donato Schultz, DO  guaiFENesin (MUCINEX) 600 MG 12 hr tablet Take 600 mg by mouth 2 (two) times daily as needed for cough or to loosen phlegm.    [provider]  LINZESS 145 MCG CAPS capsule Take 145 mcg by mouth every morning. 06/18/19   [provider]  pantoprazole (PROTONIX) 40 MG tablet TAKE 1 TABLET(40 MG) BY MOUTH DAILY 05/30/20   Corwin Levins, MD  sildenafil (REVATIO) 20 MG tablet Take 1-5 tablets by mouth daily as needed. 02/08/20   Corwin Levins, MD  terbinafine (LAMISIL) 250 MG tablet Take 1 tablet (250 mg total) by mouth daily. 07/25/18   Lenn Sink, DPM  tiZANidine (ZANAFLEX) 4 MG tablet TAKE 1 TABLET(4 MG) BY MOUTH EVERY 6 HOURS AS NEEDED FOR MUSCLE SPASMS 05/09/20   Corwin Levins, MD  traMADol (ULTRAM) 50 MG tablet TAKE ONE TABLET BY MOUTH EVERY 8 HOURS AS NEEDED (OFFICE VISIT FOR MORE REFILLS). 05/01/20   Corwin Levins, MD    Allergies    Patient has no known allergies.  Review of Systems   Review of Systems  Constitutional:  Negative for fever.  Skin:  Positive for color change and wound.  All other systems reviewed and are negative.  Physical Exam Updated Vital Signs BP (!) 123/102   Pulse 73   Temp 97.7 F (36.5 C) (Temporal)   Resp 17   Ht 5\' 8"  (1.727 m)   Wt 86.2 kg   SpO2 99%   BMI 28.89 kg/m   Physical Exam Vitals and nursing note reviewed.  Constitutional:      General: He is not in acute distress.    Appearance: Normal appearance. He is well-developed.  HENT:     Head: Normocephalic and atraumatic.     Right Ear: Hearing normal.     Left Ear: Hearing normal.     Nose: Nose normal.  Eyes:     Conjunctiva/sclera: Conjunctivae normal.     Pupils: Pupils are equal, round, and reactive to light.  Cardiovascular:     Rate and Rhythm: Regular rhythm.     Heart sounds: S1 normal and  S2 normal. No murmur heard.   No friction rub. No gallop.  Pulmonary:     Effort: Pulmonary effort is normal. No respiratory distress.     Breath sounds: Normal breath sounds.  Chest:     Chest wall: No tenderness.  Abdominal:     General: Bowel sounds are normal.     Palpations: Abdomen is soft.     Tenderness: There is no abdominal tenderness. There is no guarding or rebound. Negative signs include  Murphy's sign and McBurney's sign.     Hernia: No hernia is present.  Musculoskeletal:        General: Normal range of motion.     Cervical back: Normal range of motion and neck supple.  Skin:    General: Skin is warm and dry.     Findings: Erythema (distal right foot and toes) and wound (plantar right foot) present. No rash.  Neurological:     Mental Status: He is alert and oriented to person, place, and time.     GCS: GCS eye subscore is 4. GCS verbal subscore is 5. GCS motor subscore is 6.     Cranial Nerves: No cranial nerve deficit.     Sensory: No sensory deficit.     Coordination: Coordination normal.  Psychiatric:        Speech: Speech normal.        Behavior: Behavior normal.        Thought Content: Thought content normal.       ED Results / Procedures / Treatments   Labs (all labs ordered are listed, but only abnormal results are displayed) Labs Reviewed  CBC WITH DIFFERENTIAL/PLATELET - Abnormal; Notable for the following components:      Result Value   Platelets 437 (*)    All other components within normal limits  COMPREHENSIVE METABOLIC PANEL - Abnormal; Notable for the following components:   Glucose, Bld 107 (*)    ALT 77 (*)    Alkaline Phosphatase 201 (*)    All other components within normal limits  LACTIC ACID, PLASMA    EKG None  Radiology DG Foot Complete Right  Result Date: 08/16/2020 CLINICAL DATA:  Foot infection EXAM: RIGHT FOOT COMPLETE - 3+ VIEW COMPARISON:  None. FINDINGS: There is a linear, metallic foreign body within the plantar soft  tissues at the level of the second toe. There is no acute fracture. Bones are normal. IMPRESSION: Linear, metallic foreign body within the plantar soft tissues at the level of the second toe. Electronically Signed   By: Deatra Robinson M.D.   On: 08/16/2020 22:38    Procedures .Foreign Body Removal  Date/Time: 08/17/2020 5:10 AM Performed by: Gilda Crease, MD Authorized by: Gilda Crease, MD  Consent: Verbal consent obtained. Risks and benefits: risks, benefits and alternatives were discussed Consent given by: patient Patient understanding: patient states understanding of the procedure being performed Patient consent: the patient's understanding of the procedure matches consent given Procedure consent: procedure consent matches procedure scheduled Relevant documents: relevant documents present and verified Test results: test results available and properly labeled Site marked: the operative site was marked Imaging studies: imaging studies available Required items: required blood products, implants, devices, and special equipment available Patient identity confirmed: verbally with patient Time out: Immediately prior to procedure a "time out" was called to verify the correct patient, procedure, equipment, support staff and site/side marked as required. Body area: skin General location: lower extremity Location details: right foot Anesthesia: local infiltration  Anesthesia: Local Anesthetic: lidocaine 1% without epinephrine Anesthetic total: 20 mL  Sedation: Patient sedated: no  Patient restrained: no Patient cooperative: yes Localization method: probed and serial x-rays Complexity: complex 0 objects recovered. Post-procedure assessment: foreign body not removed Patient tolerance: patient tolerated the procedure well with no immediate complications Comments: 1cm incison with 11 blade made x2. Unsuccessful attempt.    Medications Ordered in ED Medications   vancomycin (VANCOREADY) IVPB 1750 mg/350 mL (0 mg Intravenous Stopped 08/17/20 0117)  lidocaine (  PF) (XYLOCAINE) 1 % injection 30 mL (30 mLs Infiltration Given 08/16/20 2358)    ED Course  I have reviewed the triage vital signs and the nursing notes.  Pertinent labs & imaging results that were available during my care of the patient were reviewed by me and considered in my medical decision making (see chart for details).    MDM Rules/Calculators/A&P                           Patient presents to the emergency department for evaluation of redness, pain and swelling of the foot.  X-ray shows a linear, seemingly metallic foreign body in the soft tissues near the area of blistering and skin breakdown.  I recommended an attempt to remove the foreign body.  Area was anesthetized and an incision was made at the base of the third toe and medial aspect of the second toe.  These areas were extensively probed with forceps but no foreign body was removed.  Wound will be left open and allowed to drain.  Patient given a dose of vancomycin, will discharge on further antibiotic coverage.  Return for recheck  Final Clinical Impression(s) / ED Diagnoses Final diagnoses:  Foreign body in right foot, initial encounter  Cellulitis of right lower extremity    Rx / DC Orders ED Discharge Orders          Ordered    amoxicillin-clavulanate (AUGMENTIN) 875-125 MG tablet  2 times daily        08/17/20 0517    sulfamethoxazole-trimethoprim (BACTRIM DS) 800-160 MG tablet  2 times daily        08/17/20 0517    HYDROcodone-acetaminophen (NORCO/VICODIN) 5-325 MG tablet  Every 4 hours PRN        08/17/20 0517             Gilda Crease, MD 08/17/20 0518    Gilda Crease, MD 10/02/20 [phone]

## 2020-08-16 NOTE — Telephone Encounter (Signed)
I can try to see at 420 PM today as addon if he wants or is able, o/w I would have to say go to UC   thanks

## 2020-08-16 NOTE — Assessment & Plan Note (Addendum)
Starting distal dorsal foot with extension over the dorsal mid foot; no evidence for sepis, but given severity and abscess needs urgent ED evaluation now, will need IV antibx with coverage including MRSA given hx

## 2020-08-16 NOTE — Telephone Encounter (Signed)
I was able to schedule pt for today at 4:20 per Dr. Jonny Ruiz. Pt stated he will be here.

## 2020-08-16 NOTE — ED Triage Notes (Addendum)
Pt is present for an abscess on the bottom of his right foot that started to develop after he stepped on some glass appx three days ago. Toes of the right foot are now swollen and red, as well as painful. Pt was seen by PCP and they sent him here for possible IV or oral abx. Hx of MRSA on the same foot.

## 2020-08-17 ENCOUNTER — Emergency Department (HOSPITAL_BASED_OUTPATIENT_CLINIC_OR_DEPARTMENT_OTHER): Payer: Managed Care, Other (non HMO)

## 2020-08-17 MED ORDER — AMOXICILLIN-POT CLAVULANATE 875-125 MG PO TABS: 1.0000 | ORAL_TABLET | Freq: Two times a day (BID) | ORAL | 0 refills | Status: DC

## 2020-08-17 MED ORDER — SULFAMETHOXAZOLE-TRIMETHOPRIM 800-160 MG PO TABS: 1.0000 | ORAL_TABLET | Freq: Two times a day (BID) | ORAL | 0 refills | Status: DC

## 2020-08-17 MED ORDER — HYDROCODONE-ACETAMINOPHEN 5-325 MG PO TABS: 1.0000 | ORAL_TABLET | ORAL | 0 refills | Status: DC | PRN

## 2020-08-17 NOTE — Discharge Instructions (Addendum)
Do not take Vicodin and Ultram together.  Use Vicodin for severe pain.  Start the antibiotics this morning.  Soak the foot in warm water several times a day.  Return to the emergency department if any of the redness, swelling or infectious symptoms worsen.

## 2020-08-21 ENCOUNTER — Ambulatory Visit (INDEPENDENT_AMBULATORY_CARE_PROVIDER_SITE_OTHER): Payer: Managed Care, Other (non HMO) | Admitting: Podiatry

## 2020-08-21 ENCOUNTER — Other Ambulatory Visit: Payer: Self-pay

## 2020-08-21 ENCOUNTER — Ambulatory Visit (INDEPENDENT_AMBULATORY_CARE_PROVIDER_SITE_OTHER): Payer: Managed Care, Other (non HMO)

## 2020-08-21 DIAGNOSIS — L03119 Cellulitis of unspecified part of limb: Secondary | ICD-10-CM | POA: Diagnosis not present

## 2020-08-21 DIAGNOSIS — L02619 Cutaneous abscess of unspecified foot: Secondary | ICD-10-CM

## 2020-08-21 DIAGNOSIS — S90851A Superficial foreign body, right foot, initial encounter: Secondary | ICD-10-CM | POA: Diagnosis not present

## 2020-08-21 NOTE — Telephone Encounter (Signed)
Ok to send last OV     thanks

## 2020-08-22 ENCOUNTER — Telehealth: Payer: Self-pay | Admitting: Urology

## 2020-08-22 ENCOUNTER — Ambulatory Visit: Payer: Managed Care, Other (non HMO) | Admitting: Podiatry

## 2020-08-22 ENCOUNTER — Encounter: Payer: Self-pay | Admitting: Podiatry

## 2020-08-22 DIAGNOSIS — L03119 Cellulitis of unspecified part of limb: Secondary | ICD-10-CM

## 2020-08-22 DIAGNOSIS — S90851A Superficial foreign body, right foot, initial encounter: Secondary | ICD-10-CM | POA: Diagnosis not present

## 2020-08-22 DIAGNOSIS — L02619 Cutaneous abscess of unspecified foot: Secondary | ICD-10-CM

## 2020-08-22 NOTE — Telephone Encounter (Signed)
DOS - 08/23/20  INCISION REMOVAL FOREIGN BODY RIGHT --- 10121    [employer] EFFECTIVE DATE - 01/12/2018   PER [employer]'S AUTOMATIVE SYSTEM NO PRIOR AUTH IS REQUIRED FOR CPT CODE 76720.  REF # [id number]

## 2020-08-22 NOTE — Progress Notes (Signed)
Subjective:   Patient ID: Larry Riley, male   DOB: 40 y.o.   MRN: [id number]   HPI Patient presents stating that approximately a week ago he stepped on something on his right foot was not sure of what it was and then it started to swell and he went to the emergency room and followed that with urgent care.  States at the emergency room they tried to find the foreign body were not successful and that he then started to get swelling in the last few days went to the urgent care and was placed on 2 antibiotics Augmentin and Septra.  States it continues to be draining and sore and swollen in this area.  Points to the second interspace   ROS      Objective:  Physical Exam  Neurovascular status unchanged with history of left-sided weakness with Horner's syndrome neurofibromatosis and foot drop left with brace.  Right foot shows that there is a area of swelling in the second interspace with an obvious small incision that was made to explore this area with localized erythema edema no proximal edema erythema noted but pictures that he showed me from several days ago indicated that he had a significant proximal infection which appears to have responded to the antibiotics has been on for the last few days.  He is also very sore and is not walking with any form of normal gait     Assessment:  Acute abscess formation right second interspace with foreign body present on x-ray that is localized but will need to be removed     Plan:  H&P reviewed condition and x-ray.  I discussed opening this area up but in a more controlled environment the difficulty of finding the smaller foreign body but hopefully can be found in the possibility that we will need to be packed open and may require hospitalization.  He did have 1 round of IV antibiotics at the ER and may require further antibiotics depending on what is found and I do feel like this needs to be done ASAP.  I am referring him to Dr. Lilian Kapur tomorrow for  probable surgery on Friday and I did dispense a surgical shoe with wedge so he does not bear forefoot pressure against this area  X-rays indicate that there is a small foreign body right second interspace with no indications currently of osteolysis or obvious abscess formation on x-ray

## 2020-08-22 NOTE — Addendum Note (Signed)
Addended byLilian Kapur, Evamaria Detore R on: 08/22/2020 09:07 AM   Modules accepted: Level of Service

## 2020-08-22 NOTE — Telephone Encounter (Signed)
Documents fax to [facility] [phone].

## 2020-08-22 NOTE — Progress Notes (Signed)
  Subjective:  Patient ID: Larry Riley, male    DOB: [birth year],  MRN: [id number]  Chief Complaint  Patient presents with   Foot Pain       foreign object right foot/ Per Dr. Cordella Register Larry Riley needs to be 8am    40 y.o. male presents with the above complaint. History confirmed with patient. Stepped on a needle last week was seen in ED they attempted to remove the needle but were unsuccessful in over 2 hours. He is on augmentin and Bactrim, redness and swelling greatly improved   Objective:  Physical Exam: warm, good capillary refill, no trophic changes or ulcerative lesions, normal DP and PT pulses, and normal sensory exam. Right Foot: granular ulceration plantar sulcus 2-3, erythema improved   Reviewed xrays from ED metallic needle FB present  Assessment:   1. Acute foreign body of right foot, initial encounter   2. Cellulitis and abscess of foot, except toes      Plan:  Patient was evaluated and treated and all questions answered.   Discussed surgical excision of the FB. Discussed that we may need to pack open at this point and heal secondarily. We discussed risks, benefits and complications including risk of needing to leave the FB if not successful and risk of further infection. Surgery scheduled for tomorrow   Surgical plan:  Procedure: -removal FB right foot  Location: -[facility]  Anesthesia plan: -local with IV sedation   Postoperative pain plan: - Tylenol 1000 mg every 6 hours, ibuprofen 600 mg every 6 hours, oxycodone 5 mg 1-2 tabs every 6 hours only as needed  DVT prophylaxis: -none required  WB Restrictions / DME needs: -none has Sx shoe    No follow-ups on file.

## 2020-08-23 ENCOUNTER — Encounter: Payer: Self-pay | Admitting: Podiatry

## 2020-08-23 ENCOUNTER — Other Ambulatory Visit: Payer: Self-pay | Admitting: Podiatry

## 2020-08-23 DIAGNOSIS — M795 Residual foreign body in soft tissue: Secondary | ICD-10-CM | POA: Diagnosis not present

## 2020-08-23 DIAGNOSIS — S90851A Superficial foreign body, right foot, initial encounter: Secondary | ICD-10-CM

## 2020-08-23 MED ORDER — MUPIROCIN 2 % EX OINT: 1.0000 "application " | TOPICAL_OINTMENT | CUTANEOUS | 0 refills | Status: DC

## 2020-08-23 NOTE — Progress Notes (Signed)
8/12 removal of FB

## 2020-08-27 ENCOUNTER — Encounter: Payer: Self-pay | Admitting: Podiatry

## 2020-08-29 ENCOUNTER — Other Ambulatory Visit: Payer: Self-pay

## 2020-08-29 ENCOUNTER — Encounter: Payer: Self-pay | Admitting: Podiatry

## 2020-08-29 ENCOUNTER — Ambulatory Visit (INDEPENDENT_AMBULATORY_CARE_PROVIDER_SITE_OTHER): Payer: Managed Care, Other (non HMO) | Admitting: Podiatry

## 2020-08-29 DIAGNOSIS — S90851A Superficial foreign body, right foot, initial encounter: Secondary | ICD-10-CM

## 2020-09-03 NOTE — Progress Notes (Signed)
  Subjective:  Patient ID: Larry Riley, male    DOB: 1980/04/11,  MRN: [id number]  Chief Complaint  Patient presents with   Routine Post Op     POV #1 DOS 08/23/2020 REMOVAL FOREIGN BODY RT FOOT     40 y.o. male returns for post-op check.   Review of Systems: Negative except as noted in the HPI. Denies N/V/F/Ch.   Objective:  There were no vitals filed for this visit. There is no height or weight on file to calculate BMI. Constitutional Well developed. Well nourished.  Vascular Foot warm and well perfused. Capillary refill normal to all digits.   Neurologic Normal speech. Oriented to person, place, and time. Epicritic sensation to light touch grossly present bilaterally.  Dermatologic Skin healing well without signs of infection by secondary intention.   Orthopedic: Tenderness to palpation noted about the surgical site.   Assessment:   1. Acute foreign body of right foot, initial encounter    Plan:  Patient was evaluated and treated and all questions answered.  S/p foot surgery right -Progressing as expected post-operatively. -WB Status: WBAT in surgical shoe -Foot redressed.  Return in about 1 week (around 09/05/2020) for post op (no x-rays), wound care.

## 2020-09-04 ENCOUNTER — Encounter: Payer: Self-pay | Admitting: Internal Medicine

## 2020-09-04 DIAGNOSIS — G894 Chronic pain syndrome: Secondary | ICD-10-CM

## 2020-09-05 ENCOUNTER — Other Ambulatory Visit: Payer: Self-pay

## 2020-09-05 ENCOUNTER — Ambulatory Visit (INDEPENDENT_AMBULATORY_CARE_PROVIDER_SITE_OTHER): Payer: Managed Care, Other (non HMO) | Admitting: Podiatry

## 2020-09-05 DIAGNOSIS — S90851D Superficial foreign body, right foot, subsequent encounter: Secondary | ICD-10-CM

## 2020-09-05 DIAGNOSIS — S90851S Superficial foreign body, right foot, sequela: Secondary | ICD-10-CM

## 2020-09-09 NOTE — Progress Notes (Signed)
  Subjective:  Patient ID: Larry Riley, male    DOB: 11-10-1980,  MRN: [id number]  Chief Complaint  Patient presents with   Routine Post Op     POV #2 DOS 08/23/2020 REMOVAL FOREIGN BODY RT FOOT     40 y.o. male returns for post-op check.  Overall doing well he has been changing the dressing and continues to improve he is completed the antibiotics  Review of Systems: Negative except as noted in the HPI. Denies N/V/F/Ch.   Objective:  There were no vitals filed for this visit. There is no height or weight on file to calculate BMI. Constitutional Well developed. Well nourished.  Vascular Foot warm and well perfused. Capillary refill normal to all digits.   Neurologic Normal speech. Oriented to person, place, and time. Epicritic sensation to light touch grossly present bilaterally.  Dermatologic Skin healing well without signs of infection by secondary intention.  Only area of healing remains is a small spot approximately 5 mm in diameter  Orthopedic: Tenderness to palpation noted about the surgical site.   Assessment:   1. Acute foreign body of right foot, sequela    Plan:  Patient was evaluated and treated and all questions answered.  S/p foot surgery right -Overall doing well if he can resume his regular bathing routine and continue to apply a small bandage with ointment on the small open area.  Should resolve uneventfully.  Return to regular shoe gear as tolerated.  Return to see me as needed  Return if symptoms worsen or fail to improve.

## 2020-09-12 ENCOUNTER — Encounter: Payer: Managed Care, Other (non HMO) | Admitting: Podiatry

## 2020-09-14 MED ORDER — TRAMADOL HCL 50 MG PO TABS: ORAL_TABLET | ORAL | 2 refills | Status: DC

## 2020-09-17 ENCOUNTER — Telehealth: Payer: Self-pay

## 2020-09-17 MED ORDER — CELECOXIB 200 MG PO CAPS: 200.0000 mg | ORAL_CAPSULE | Freq: Two times a day (BID) | ORAL | 1 refills | Status: DC

## 2020-10-03 ENCOUNTER — Encounter: Payer: Managed Care, Other (non HMO) | Admitting: Podiatry

## 2020-10-04 ENCOUNTER — Telehealth: Payer: Self-pay

## 2020-10-04 NOTE — Telephone Encounter (Signed)
Transition Care Management Unsuccessful Follow-up Telephone Call  Date of discharge and from where:  09/28/20 from [facility]  Attempts:  1st Attempt  Reason for unsuccessful TCM follow-up call:  Left voice message   Kathyrn Sheriff, RN, MSN, BSN, CCM [facility] Care Management Coordinator [phone]

## 2020-10-07 ENCOUNTER — Telehealth: Payer: Self-pay

## 2020-10-07 NOTE — Telephone Encounter (Signed)
Transition Care Management Unsuccessful Follow-up Telephone Call  Date of discharge and from where:  09/28/20 from [facility]  Attempts:  2nd Attempt  Reason for unsuccessful TCM follow-up call:  Left voice message   Kathyrn Sheriff, RN, MSN, BSN, CCM [facility] Care Management Coordinator [phone]

## 2020-10-08 ENCOUNTER — Ambulatory Visit: Payer: Managed Care, Other (non HMO) | Admitting: Occupational Therapy

## 2020-10-08 ENCOUNTER — Other Ambulatory Visit: Payer: Self-pay

## 2020-10-08 ENCOUNTER — Encounter: Payer: Self-pay | Admitting: Occupational Therapy

## 2020-10-08 ENCOUNTER — Ambulatory Visit: Payer: Managed Care, Other (non HMO) | Attending: Neurosurgery

## 2020-10-08 DIAGNOSIS — R29898 Other symptoms and signs involving the musculoskeletal system: Secondary | ICD-10-CM | POA: Diagnosis present

## 2020-10-08 DIAGNOSIS — R278 Other lack of coordination: Secondary | ICD-10-CM | POA: Insufficient documentation

## 2020-10-08 DIAGNOSIS — R2689 Other abnormalities of gait and mobility: Secondary | ICD-10-CM | POA: Diagnosis present

## 2020-10-08 DIAGNOSIS — G8929 Other chronic pain: Secondary | ICD-10-CM | POA: Insufficient documentation

## 2020-10-08 DIAGNOSIS — M542 Cervicalgia: Secondary | ICD-10-CM | POA: Diagnosis present

## 2020-10-08 DIAGNOSIS — M545 Low back pain, unspecified: Secondary | ICD-10-CM | POA: Insufficient documentation

## 2020-10-08 DIAGNOSIS — R208 Other disturbances of skin sensation: Secondary | ICD-10-CM | POA: Insufficient documentation

## 2020-10-08 DIAGNOSIS — R2681 Unsteadiness on feet: Secondary | ICD-10-CM | POA: Diagnosis present

## 2020-10-08 DIAGNOSIS — M6281 Muscle weakness (generalized): Secondary | ICD-10-CM

## 2020-10-08 DIAGNOSIS — R293 Abnormal posture: Secondary | ICD-10-CM | POA: Insufficient documentation

## 2020-10-08 NOTE — Patient Instructions (Addendum)
Walking Program: Walk with your wife. Walk during cooler times of the day. Starting next week, Walk 10 min per day for 7 days. Following week add 2 minutes to your total time. Week 1 10 min Week 2: 12 min Week 3: 14 min Week 4: 16 min...Marland KitchenMarland KitchenUntil you can walk to 30 min per day

## 2020-10-08 NOTE — Therapy (Signed)
[facility] [address] [place], [place], [address] Phone: [phone]   Fax:  [phone]  Physical Therapy Evaluation  Patient Details  Name: Larry Riley MRN: [id number] Date of Birth: 1980-05-08 Referring Provider (PT): Dr. Mindi Junker   Encounter Date: 10/08/2020   PT End of Session - 10/08/20 1601     Visit Number 1    Number of Visits 17    Date for PT Re-Evaluation 12/03/20    Authorization Type [employer] (60v max combined)    PT Start Time 1400    PT Stop Time 1445    PT Time Calculation (min) 45 min    Equipment Utilized During Treatment Gait belt    Activity Tolerance Patient tolerated treatment well    Behavior During Therapy [facility] for tasks assessed/performed             Past Medical History:  Diagnosis Date   GERD (gastroesophageal reflux disease) 01/21/2017   MRSA (methicillin resistant Staphylococcus aureus) 2005   leg   Neurofibromatosis    tumor down spine and brain    Past Surgical History:  Procedure Laterality Date   hydrocephalus     [place]unt 1999[place]   NECK SURGERY      tumor removal   SPINE SURGERY  10/2013   c2-t2 fusion  , laminectomy c1-6-- Dr Raynald Kemp-- [place]   VASECTOMY  02/2013    There were no vitals filed for this visit.    Subjective Assessment - 10/08/20 1407     Subjective Pt reports symptoms were getting worst since 2020. Pt was diagnosed with neurofibromatosis in 1995. His first tumor resection and C2-4 fusion was in 1995. Second surgery was in 2015 for C2-T2 fusion and tumor resection again. THird surgery was in 2018 for C3-5 tumor resection. Recently had c2 nerve root tumor resection in 09/24/2020. He is wearing AFO on L foot since 2018. Had 2 surgeries on R foot due to metal being stuck and getting infection.    Pertinent History C2-T2 cervical fusion, neurofibromatosis, L foot drop    Patient Stated Goals Walk at least 1 mile, to get in and out of chair easier  without using arms too much,    Currently in Pain? Yes    Pain Score 9     Pain Location Neck    Pain Orientation Right;Left    Pain Descriptors / Indicators Aching;Tightness;Throbbing    Pain Type Surgical pain    Pain Onset More than a month ago    Pain Frequency Constant    Aggravating Factors  sitting, standing, walking    Pain Relieving Factors pain medications, mm relaxants, massage, biofreeze    Multiple Pain Sites Yes    Pain Score 6    Pain Location Back    Pain Orientation Right;Left    Pain Descriptors / Indicators Aching;Throbbing;Tightness    Pain Type Chronic pain    Pain Onset More than a month ago    Pain Frequency Constant    Aggravating Factors  siting, standing, walking    Pain Relieving Factors reposition, rest, pain medications                [facility] PT Assessment - 10/08/20 0001       Assessment   Medical Diagnosis Neurofibromatosis    Referring Provider (PT) Dr. Mindi Junker    Onset Date/Surgical Date 09/24/20      Precautions   Precautions Fall;Other (comment)   Lifting no more than 10 lbs total  Restrictions   Weight Bearing Restrictions No      Balance Screen   Has the patient fallen in the past 6 months Yes    How many times? 6    Has the patient had a decrease in activity level because of a fear of falling?  No    Is the patient reluctant to leave their home because of a fear of falling?  No      Home Tourist information centre manager residence    Living Arrangements Spouse/significant other   2 dogs (20lbs each)   Available Help at Discharge Family    Type of Home House    Home Access Stairs to enter    Entrance Stairs-Number of Steps 3    Entrance Stairs-Rails Can reach both;Left;Right    Home Layout One level      Prior Function   Vocation Other (comment)   Medical leave until end of OCtober     Standardized Balance Assessment   Standardized Balance Assessment Five Times Sit to Stand;10 meter walk test    Five times  sit to stand comments  27 s without HHA      Functional Gait  Assessment   Gait assessed  Yes    Gait Level Surface Walks 20 ft, slow speed, abnormal gait pattern, evidence for imbalance or deviates 10-15 in outside of the 12 in walkway width. Requires more than 7 sec to ambulate 20 ft.    Change in Gait Speed Makes only minor adjustments to walking speed, or accomplishes a change in speed with significant gait deviations, deviates 10-15 in outside the 12 in walkway width, or changes speed but loses balance but is able to recover and continue walking.    Gait with Horizontal Head Turns Performs head turns with moderate changes in gait velocity, slows down, deviates 10-15 in outside 12 in walkway width but recovers, can continue to walk.    Gait with Vertical Head Turns Performs task with moderate change in gait velocity, slows down, deviates 10-15 in outside 12 in walkway width but recovers, can continue to walk.    Gait and Pivot Turn Turns slowly, requires verbal cueing, or requires several small steps to catch balance following turn and stop    Step Over Obstacle Is able to step over one shoe box (4.5 in total height) but must slow down and adjust steps to clear box safely. May require verbal cueing.    Gait with Narrow Base of Support Ambulates less than 4 steps heel to toe or cannot perform without assistance.    Gait with Eyes Closed Cannot walk 20 ft without assistance, severe gait deviations or imbalance, deviates greater than 15 in outside 12 in walkway width or will not attempt task.    Ambulating Backwards Walks 20 ft, slow speed, abnormal gait pattern, evidence for imbalance, deviates 10-15 in outside 12 in walkway width.    Steps Alternating feet, must use rail.    Total Score 9    FGA comment: 9/30                Discussed walking program (pt instructions) Pt educated on using walker is okay when working on walking program. Pt educated on goal to be able to walk 25 in by 2  months. We discussed it is not important to learn to walk without brace for proper safety and brace is not limiting with any exercises or activities to strengthen proximal muscle groups.  Objective measurements completed on examination: See above findings.                PT Education - 10/08/20 1556     Education Details Pt educated on expectations with physical therapy. We discussed we may not be able alter his pain levels much but we need to focus on improving his overall function to improve his standing, sitting, walking tolerance.    Person(s) Educated Patient    Methods Explanation    Comprehension Verbalized understanding              PT Short Term Goals - 10/08/20 1552       PT SHORT TERM GOAL #1   Title Patient will be able to ambulate 1050' without AD on level ground with SBA to improve short community ambulation    Baseline has not walked long since surgery    Time 4    Period Weeks    Status New    Target Date 11/05/20      PT SHORT TERM GOAL #2   Title Pt will demo 5x sit to stand score <20 seconds without HHA to improve functional strength in bil LE    Baseline 27 seconds (10/08/20)    Time 4    Period Weeks    Status New    Target Date 11/05/20      PT SHORT TERM GOAL #3   Title Patient will be able to ambulate 400 feet on grass without AD and SBA to improve safe community negotiation    Baseline not attempted after surgery (10/08/20)    Time 4    Period Weeks    Status New    Target Date 11/05/20               PT Long Term Goals - 10/08/20 1554       PT LONG TERM GOAL #1   Title Pt will demo <12 seconds with 5x sit to stand without HHA to improve functional strength in bil LE    Baseline 27 sec (10/08/20)    Time 8    Period Weeks    Status New    Target Date 12/03/20      PT LONG TERM GOAL #2   Title Pt will be able to ambulate 25 min without AD with walking program at home to improve walking endurance to access  community    Baseline not attempted, walking program issued (10/08/20)    Time 8    Period Weeks    Status New    Target Date 12/03/20      PT LONG TERM GOAL #3   Title Patient will demo >20/30 on functional gait assessment to improve functional balance and gait    Baseline 9/30 (10/08/20)    Time 8    Period Weeks    Status New    Target Date 12/03/20                    Plan - 10/08/20 1557     Clinical Impression Statement Patient is a 40 y.o. male who was seen today for gait and mobility disorder, neck pain, back pain s/p cervical tumor resection on 09/24/20. Patient has prior medical history significant for neurofibromatosis, hx of multiple cervical fusions C2-T2, mutiple tumor resections in cervical spine, L foot drop, chronic neck and back pain. Objective findings are indicative of decreased functional strength and decreased balance and gait impairments, according to 5x sit to stand test  and functional gait assessment. Patient will benefit from skilled PT to address these impairments and improve overall function.    Personal Factors and Comorbidities Comorbidity 3+    Comorbidities Neurofibromatosis, hx of cervical fusions, chronic neck pain, back pain, carpal tunnel syndrome, L foot drop    Examination-Activity Limitations Bend;Carry;Dressing;Lift;Reach Overhead;Sit;Sleep;Squat;Stairs;Stand;Transfers;Bathing    Examination-Participation Restrictions Cleaning;Community Activity;Driving;Laundry;Meal Prep;Occupation;Shop;Yard Work    Conservation officer, historic buildings Evolving/Moderate complexity    Clinical Decision Making Moderate    Rehab Potential Good    PT Frequency 2x / week    PT Duration 8 weeks    PT Treatment/Interventions ADLs/Self Care Home Management;Cryotherapy;Electrical Stimulation;Moist Heat;Traction;Gait training;Stair training;Functional mobility training;Therapeutic activities;Therapeutic exercise;Balance training;Neuromuscular re-education;Patient/family  education;Orthotic Fit/Training;Manual techniques;Scar mobilization;Passive range of motion;Dry needling;Energy conservation;Spinal Manipulations;Joint Manipulations    PT Next Visit Plan Work on functional strength, standing balance, gait training    PT Home Exercise Plan Walking program    Consulted and Agree with Plan of Care Patient             Patient will benefit from skilled therapeutic intervention in order to improve the following deficits and impairments:  Abnormal gait, Decreased activity tolerance, Decreased balance, Decreased range of motion, Decreased mobility, Decreased endurance, Decreased skin integrity, Decreased strength, Difficulty walking, Increased fascial restricitons, Increased muscle spasms, Impaired flexibility, Impaired sensation, Impaired tone, Impaired UE functional use, Improper body mechanics, Postural dysfunction, Pain  Visit Diagnosis: Other abnormalities of gait and mobility  Muscle weakness (generalized)  Unsteadiness on feet  Cervicalgia  Abnormal posture  Chronic bilateral low back pain without sciatica     Problem List Patient Active Problem List   Diagnosis Date Noted   Abscess of right foot 08/16/2020   Cellulitis of right foot 08/16/2020   Microhematuria 08/03/2020   Gastroesophageal reflux disease 07/07/2019   External otitis of right ear 03/09/2017   Thumb laceration, right, initial encounter 03/09/2017   Weakness of left side of body 11/19/2016   Stress due to illness of family member 06/10/2016   Syncope 04/23/2016   Onychomycosis 04/23/2016   Impacted ear wax 04/23/2016   Constipation due to opioid therapy 06/13/2014   Chronic pain syndrome 02/21/2014   Allergic reaction 02/20/2014   Neurofibromatosis, type 1 (HCC) 12/14/2013   Status post cervical arthrodesis 11/21/2013   Cervical myelopathy (HCC) 09/21/2013   Preventative health care 06/23/2010   HORNER'S SYNDROME 04/12/2009   Other psoriasis 08/24/2007   Tinea pedis  08/24/2007    Ileana Ladd, PT 10/08/2020, 4:02 PM  [facility] [address] [place], [place], [address] Phone: [phone]   Fax:  [phone]  Name: Larry Riley MRN: [id number] Date of Birth: [birth year]

## 2020-10-09 NOTE — Therapy (Signed)
[facility] [address] [place], [place], [address] Phone: [phone]   Fax:  [phone]  Occupational Therapy Evaluation  Patient Details  Name: Larry Riley MRN: [id number] Date of Birth: February 12, 1980 Referring Provider (OT): Dr. Raynald Kemp   Encounter Date: 10/08/2020   OT End of Session - 10/09/20 0754     Visit Number 1    Number of Visits 25    Date for OT Re-Evaluation 01/01/21    Authorization Type cigna    Authorization - Visit Number 1    Authorization - Number of Visits 30   60 visits combined   OT Start Time 1450    OT Stop Time 1530    OT Time Calculation (min) 40 min    Behavior During Therapy [facility] for tasks assessed/performed             Past Medical History:  Diagnosis Date   GERD (gastroesophageal reflux disease) 01/21/2017   MRSA (methicillin resistant Staphylococcus aureus) 2005   leg   Neurofibromatosis    tumor down spine and brain    Past Surgical History:  Procedure Laterality Date   hydrocephalus     [place]unt 1999[place]   NECK SURGERY      tumor removal   SPINE SURGERY  10/2013   c2-t2 fusion  , laminectomy c1-6-- Dr Raynald Kemp-- [place]   VASECTOMY  02/2013    There were no vitals filed for this visit.   Subjective Assessment - 10/09/20 0759     Pertinent History Larry Riley is a 40 y.o. 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 Raynald Kemp. PMH: Neurofibromatosis, type 1,   debulking of cervical spine tumor/Cervical athrodesis, Horners Syndrome, and hydrocephalus, numerous cervical surgeries over the years (6-7    Limitations No bending, lifting, twisting , pushing or pulling over 10 lbs  no driving til cleared by MD no strenuous exercise for 4 weeks   lifting restriction for 4 week               [facility] OT Assessment - 10/08/20 1458       Assessment   Medical Diagnosis Neurofibromatosis, nerve root tumor resection    Referring Provider (OT)  Dr. Raynald Kemp    Onset Date/Surgical Date 09/24/20    Prior Therapy OT at this clinic      Precautions   Precautions Fall;Other (comment)    Precaution Comments no bending, lifting, twisting, pushing or pulling over 10 lbs, no driving til cleared by MD, lifting restriction      Restrictions   Weight Bearing Restrictions No      Home  Environment   Family/patient expects to be discharged to: Private residence    Home Access Stairs    Bathroom Shower/Tub Tub/Shower unit    Lives With Spouse      Prior Function   Level of Independence Independent    Vocation Other (comment)   medical leave til end of October   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      IADL   Light Housekeeping Performs light daily tasks but cannot maintain acceptable level of cleanliness    Meal Prep Needs to have meals prepared and served    Medication Management Takes responsibility if medication is prepared in advance in seperate dosage    Financial Management Manages financial matters independently (budgets, writes checks, pays rent, bills goes to bank), collects and keeps track of income      Mobility   Mobility Status Independent      Written Expression   Dominant Hand Right    Handwriting 50% legible;Increased time      Cognition   Overall Cognitive Status Within Functional Limits for tasks assessed      Observation/Other Assessments   Physical Performance Test   Yes    Donning  Doffing Jacket Comments 3 button/ unbutton 1 min 32 secs      Sensation   Light Touch Impaired by gross assessment   for RUE   Hot/Cold Impaired by gross assessment   for RUE     Coordination   Gross Motor Movements are Fluid and Coordinated No    Fine Motor Movements are Fluid and Coordinated No    9 Hole Peg Test Right;Left    Right 9 Hole Peg Test 6 placed in 2 mins    Left 9 Hole Peg Test 55.72    Box and Blocks RUE  33, LUE 46    Coordination impaired for RUE greater than left      ROM / Strength   AROM / PROM / Strength AROM;Strength      AROM   Overall AROM  Within functional limits for tasks performed   tested below 90* due to precautions   Overall AROM Comments WFLs for UE's tested to 90 due to precautions      Strength   Overall Strength Deficits;Unable to assess;Due to precautions      Hand Function   Right Hand Grip (lbs) 62.1    Left Hand Grip (lbs) 47.8                                OT Short Term Goals - 10/08/20 1524       OT SHORT TERM GOAL #1   Title Pt will be I with HEP -11/20/20    Time 6    Period Weeks    Status New    Target Date 11/20/20      OT SHORT TERM GOAL #2   Title Pt will demonstrate improved coordination for ADLs as evidenced by placing 9 pegs in 2 mins  or less for RUE    Baseline 6 pegs placed in 2 mins    Time 6    Period Weeks    Status New      OT SHORT TERM GOAL #3   Title Pt will perform bathing and dressing with min A    Time 6    Period Weeks    Status New      OT SHORT TERM GOAL #4   Title Pt will demonstrate understaning of adapted strategies/ AE to assist with ADLS (such as feeding, cutting food, using computer mouse.)    Time 6    Period Weeks    Status New      OT SHORT TERM GOAL #5  Title Pt will demonstrate improved coordination for ADLs as evidenced by decreasing 9 hole peg test to 50 secs or less for LUE    Baseline LUE 55.72 secs    Time 6    Period Weeks    Status New      OT  SHORT TERM GOAL #6   Title Pt will demonstrate improved functional use of RUE as evidenced by increasing box/ blocks score to 37 blocks or greater.    Time 6    Period Weeks    Status New               OT Long Term Goals - 10/09/20 0740       OT LONG TERM GOAL #1   Title Pt will be I with upgraded HEP prn-01/02/20    Time 12    Period Weeks    Status New    Target Date 01/01/21      OT LONG TERM GOAL #2   Title Pt will demonstrate improved coordination for ADLs as evidenced by completing 9 hole peg test in 90 secs or less with RUE.    Baseline 6 pegs placed in 2 mins    Time 12    Period Weeks    Status New      OT LONG TERM GOAL #3   Title Pt will perfrorm all basic ADLs modified indpendently    Time 12    Period Weeks    Status New      OT LONG TERM GOAL #4   Title Patient will be able to type with both hands within reasonable period of time in preparation for work activities.    Time 12    Period Weeks    Status New      OT LONG TERM GOAL #5   Title Pt will perform basic home management and cooking modified independently    Time 12    Period Weeks    Status New      Long Term Additional Goals   Additional Long Term Goals Yes      OT LONG TERM GOAL #6   Title Pt will improve ability to fasten buttons as shown by being able to fasten/unfasten 3 buttons in less than 60 secs or less    Time 12    Period Weeks    Status New                   Plan - 10/08/20 1510     Clinical Impression Statement Larry Riley is a 40 y.o. male with a history of with NF1 s/p bilateral C2 nerve root tumor resection 09/24/20  with Dr. Raynald Kemp. PMH: Neurofibromatosis, type 1,   debulking of cervical spine tumor/Cervical athrodesis, Horners Syndrome, and hydrocephalus, numerous cervical surgeries over the years (6-7)Pt presents with decr coordination, decr strength, decr LUE ROM, decr sensation, decr UE functional use, decr balance/functional mobility for ADLs/IADLs.  Pt  would benefit from occupational therapy to address these deficits to improve LUE functional use, and ADL/IADL performance to incr independence. Current precuations:No bending, lifting, twisting , pushing or pulling over 10 lbs  no driving til cleared by MD no strenuous exercise for 4 weeks,  lifting restriction for 4 week    OT Occupational Profile and History Problem Focused Assessment - Including review of records relating to presenting problem    Occupational performance deficits (Please refer to evaluation for details): ADL's;IADL's;Work;Leisure;Play;Rest and Sleep;Social Participation    Rehab Potential Good  Clinical Decision Making Several treatment options, min-mod task modification necessary    Comorbidities Affecting Occupational Performance: May have comorbidities impacting occupational performance    Modification or Assistance to Complete Evaluation  Min-Moderate modification of tasks or assist with assess necessary to complete eval    OT Frequency 2x / week   plus eval, anticipate d/c after 8 weeks dependent on progress.   OT Duration 12 weeks    OT Treatment/Interventions Self-care/ADL training;Ultrasound;Energy conservation;Patient/family education;DME and/or AE instruction;Paraffin;Passive range of motion;Balance training;Cryotherapy;Fluidtherapy    Plan assess typing, set goal, inital HEP gfor coordination, ADL strategies    Consulted and Agree with Plan of Care Patient             Patient will benefit from skilled therapeutic intervention in order to improve the following deficits and impairments:           Visit Diagnosis: Muscle weakness (generalized) - Plan: Ot plan of care cert/re-cert  Other symptoms and signs involving the musculoskeletal system - Plan: Ot plan of care cert/re-cert  Other lack of coordination - Plan: Ot plan of care cert/re-cert  Other disturbances of skin sensation - Plan: Ot plan of care cert/re-cert  Abnormal posture - Plan: Ot plan of  care cert/re-cert  Unsteadiness on feet - Plan: Ot plan of care cert/re-cert  Other abnormalities of gait and mobility - Plan: Ot plan of care cert/re-cert    Problem List Patient Active Problem List   Diagnosis Date Noted   Abscess of right foot 08/16/2020   Cellulitis of right foot 08/16/2020   Microhematuria 08/03/2020   Gastroesophageal reflux disease 07/07/2019   External otitis of right ear 03/09/2017   Thumb laceration, right, initial encounter 03/09/2017   Weakness of left side of body 11/19/2016   Stress due to illness of family member 06/10/2016   Syncope 04/23/2016   Onychomycosis 04/23/2016   Impacted ear wax 04/23/2016   Constipation due to opioid therapy 06/13/2014   Chronic pain syndrome 02/21/2014   Allergic reaction 02/20/2014   Neurofibromatosis, type 1 (HCC) 12/14/2013   Status post cervical arthrodesis 11/21/2013   Cervical myelopathy (HCC) 09/21/2013   Preventative health care 06/23/2010   HORNER'S SYNDROME 04/12/2009   Other psoriasis 08/24/2007   Tinea pedis 08/24/2007    Jennae Hakeem, OT/L 10/09/2020, 8:01 AM  [facility] [address] [place], [place], [address] Phone: [phone]   Fax:  [phone]  Name: Larry Riley MRN: [id number] Date of Birth: September 25, 1980

## 2020-10-15 ENCOUNTER — Ambulatory Visit: Payer: Managed Care, Other (non HMO)

## 2020-10-15 ENCOUNTER — Ambulatory Visit: Payer: Managed Care, Other (non HMO) | Admitting: Occupational Therapy

## 2020-10-17 ENCOUNTER — Encounter: Payer: Self-pay | Admitting: Occupational Therapy

## 2020-10-17 ENCOUNTER — Other Ambulatory Visit: Payer: Self-pay

## 2020-10-17 ENCOUNTER — Ambulatory Visit: Payer: Managed Care, Other (non HMO) | Admitting: Rehabilitation

## 2020-10-17 ENCOUNTER — Ambulatory Visit: Payer: Managed Care, Other (non HMO) | Attending: Neurosurgery | Admitting: Occupational Therapy

## 2020-10-17 DIAGNOSIS — R2681 Unsteadiness on feet: Secondary | ICD-10-CM | POA: Insufficient documentation

## 2020-10-17 DIAGNOSIS — R293 Abnormal posture: Secondary | ICD-10-CM | POA: Diagnosis present

## 2020-10-17 DIAGNOSIS — R2689 Other abnormalities of gait and mobility: Secondary | ICD-10-CM | POA: Insufficient documentation

## 2020-10-17 DIAGNOSIS — R278 Other lack of coordination: Secondary | ICD-10-CM | POA: Insufficient documentation

## 2020-10-17 DIAGNOSIS — M6281 Muscle weakness (generalized): Secondary | ICD-10-CM | POA: Insufficient documentation

## 2020-10-17 DIAGNOSIS — R29898 Other symptoms and signs involving the musculoskeletal system: Secondary | ICD-10-CM | POA: Diagnosis not present

## 2020-10-17 DIAGNOSIS — M542 Cervicalgia: Secondary | ICD-10-CM | POA: Diagnosis present

## 2020-10-17 DIAGNOSIS — R208 Other disturbances of skin sensation: Secondary | ICD-10-CM | POA: Insufficient documentation

## 2020-10-17 NOTE — Patient Instructions (Addendum)
  Coordination Activities  Perform the following activities for 20 minutes 1-2 times per day with right hand(s).  Flip cards 1 at a time. Pick up beads, buttons, marbles, dried beans/pasta, bottle caps, etc. of different sizes and place in container. Pick up coins and place in container or coin bank. Pick up checkers or blocks and stack. Pick up checkers one at a time until you get 3 in your hand, then move checkers from palm to fingertips to stack one at a time. Practice typing

## 2020-10-17 NOTE — Therapy (Signed)
[facility] [address] [place], [place], [address] Phone: [phone]   Fax:  [phone]  Occupational Therapy Treatment  Patient Details  Name: Larry Riley MRN: [id number] Date of Birth: 09-07-1980 Referring Provider (OT): Dr. Raynald Kemp   Encounter Date: 10/17/2020   OT End of Session - 10/17/20 0856     Visit Number 2    Number of Visits 25    Date for OT Re-Evaluation 01/01/21    Authorization Type cigna    Authorization - Visit Number 2    Authorization - Number of Visits 30   60 visits combined   OT Start Time [phone]    OT Stop Time 0931    OT Time Calculation (min) 38 min    Behavior During Therapy [facility] for tasks assessed/performed             Past Medical History:  Diagnosis Date   GERD (gastroesophageal reflux disease) 01/21/2017   MRSA (methicillin resistant Staphylococcus aureus) 2005   leg   Neurofibromatosis    tumor down spine and brain    Past Surgical History:  Procedure Laterality Date   hydrocephalus     shunt 1999[place]   NECK SURGERY      tumor removal   SPINE SURGERY  10/2013   c2-t2 fusion  , laminectomy c1-6-- Dr Raynald Kemp-- [place]   VASECTOMY  02/2013    There were no vitals filed for this visit.   Subjective Assessment - 10/17/20 0850     Subjective  doing better today.  Next appt with surgeon is 10/25    Pertinent History Larry Riley is a 40 y.o. 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 Raynald Kemp. PMH: Neurofibromatosis, type 1,   debulking of cervical spine tumor/Cervical athrodesis, Horners Syndrome, and hydrocephalus, numerous cervical surgeries over the years (6-7    Limitations No bending, lifting, twisting , pushing or pulling over 10 lbs, limit overhead activity,  no driving til cleared by MD no strenuous exercise for 4 weeks   lifting restriction for 4 week    Currently in Pain? Yes    Pain Score 3     Pain Location Neck    Pain  Orientation Right;Left    Pain Descriptors / Indicators Aching    Pain Type Surgical pain    Pain Onset More than a month ago    Pain Frequency Constant    Aggravating Factors  any movement    Pain Relieving Factors pain meds               Typing test (x2):  11wpm and 91% accuracy then 17wpm with 85% accuracy.          OT Education - 10/17/20 8309     Education Details Initial Coordination HEP--see pt instructions.  Pt instructed on posture and supporting forearm on table to help with coordination/control.    Person(s) Educated Patient    Methods Explanation;Demonstration;Handout;Verbal cues    Comprehension Verbalized understanding;Returned demonstration;Verbal cues required              OT Short Term Goals - 10/08/20 1524       OT SHORT TERM GOAL #1   Title Pt will be I with HEP -11/20/20    Time 6    Period Weeks    Status New    Target Date 11/20/20      OT SHORT TERM GOAL #2   Title Pt will demonstrate  improved coordination for ADLs as evidenced by placing 9 pegs in 2 mins  or less for RUE    Baseline 6 pegs placed in 2 mins    Time 6    Period Weeks    Status New      OT SHORT TERM GOAL #3   Title Pt will perform bathing and dressing with min A    Time 6    Period Weeks    Status New      OT SHORT TERM GOAL #4   Title Pt will demonstrate understaning of adapted strategies/ AE to assist with ADLS (such as feeding, cutting food, using computer mouse.)    Time 6    Period Weeks    Status New      OT SHORT TERM GOAL #5   Title Pt will demonstrate improved coordination for ADLs as evidenced by decreasing 9 hole peg test to 50 secs or less for LUE    Baseline LUE 55.72 secs    Time 6    Period Weeks    Status New      OT SHORT TERM GOAL #6   Title Pt will demonstrate improved functional use of RUE as evidenced by increasing box/ blocks score to 37 blocks or greater.    Time 6    Period Weeks    Status New               OT Long Term  Goals - 10/17/20 0913       OT LONG TERM GOAL #1   Title Pt will be I with upgraded HEP prn-01/02/20    Time 12    Period Weeks    Status New      OT LONG TERM GOAL #2   Title Pt will demonstrate improved coordination for ADLs as evidenced by completing 9 hole peg test in 90 secs or less with RUE.    Baseline 6 pegs placed in 2 mins    Time 12    Period Weeks    Status New      OT LONG TERM GOAL #3   Title Pt will perfrorm all basic ADLs modified indpendently    Time 12    Period Weeks    Status New      OT LONG TERM GOAL #4   Title Patient will be able to type with both hands within reasonable period of time in preparation for work activities.    Time 12    Period Weeks    Status New      OT LONG TERM GOAL #5   Title Pt will perform basic home management and cooking modified independently    Time 12    Period Weeks    Status New      OT LONG TERM GOAL #6   Title Pt will improve ability to fasten buttons as shown by being able to fasten/unfasten 3 buttons in less than 60 secs or less    Time 12    Period Weeks    Status New                   Plan - 10/17/20 0857     Clinical Impression Statement Pt verbalizes understanding of coordination HEP.  Pt demo difficulty with coordination for typing with R hand (decr speed and accuracy).    OT Occupational Profile and History Problem Focused Assessment - Including review of records relating to presenting problem    Occupational performance deficits (  Please refer to evaluation for details): ADL's;IADL's;Work;Leisure;Play;Rest and Sleep;Social Participation    Rehab Potential Good    Clinical Decision Making Several treatment options, min-mod task modification necessary    Comorbidities Affecting Occupational Performance: May have comorbidities impacting occupational performance    Modification or Assistance to Complete Evaluation  Min-Moderate modification of tasks or assist with assess necessary to complete eval     OT Frequency 2x / week   plus eval, anticipate d/c after 8 weeks dependent on progress.   OT Duration 12 weeks    OT Treatment/Interventions Self-care/ADL training;Ultrasound;Energy conservation;Patient/family education;DME and/or AE instruction;Paraffin;Passive range of motion;Balance training;Cryotherapy;Fluidtherapy    Plan continue with coordination, typing, and ADL strategies    Consulted and Agree with Plan of Care Patient             Patient will benefit from skilled therapeutic intervention in order to improve the following deficits and impairments:           Visit Diagnosis: Other symptoms and signs involving the musculoskeletal system  Other lack of coordination  Other disturbances of skin sensation  Abnormal posture  Unsteadiness on feet  Muscle weakness (generalized)    Problem List Patient Active Problem List   Diagnosis Date Noted   Abscess of right foot 08/16/2020   Cellulitis of right foot 08/16/2020   Microhematuria 08/03/2020   Gastroesophageal reflux disease 07/07/2019   External otitis of right ear 03/09/2017   Thumb laceration, right, initial encounter 03/09/2017   Weakness of left side of body 11/19/2016   Stress due to illness of family member 06/10/2016   Syncope 04/23/2016   Onychomycosis 04/23/2016   Impacted ear wax 04/23/2016   Constipation due to opioid therapy 06/13/2014   Chronic pain syndrome 02/21/2014   Allergic reaction 02/20/2014   Neurofibromatosis, type 1 (HCC) 12/14/2013   Status post cervical arthrodesis 11/21/2013   Cervical myelopathy (HCC) 09/21/2013   Preventative health care 06/23/2010   HORNER'S SYNDROME 04/12/2009   Other psoriasis 08/24/2007   Tinea pedis 08/24/2007    Charlisa Cham, OT/L 10/17/2020, 1:48 PM  [facility] [address] [place], [place], [address] Phone: [phone]   Fax:  [phone]  Name: Larry Riley MRN: [id number] Date  of Birth: 1980-07-12  Willa Frater, OTR/L [facility] [address] [place], [place]  [address] [phone] phone [phone] 10/17/20 1:49 PM

## 2020-10-21 ENCOUNTER — Ambulatory Visit: Payer: Managed Care, Other (non HMO)

## 2020-10-21 ENCOUNTER — Other Ambulatory Visit: Payer: Self-pay

## 2020-10-21 DIAGNOSIS — R278 Other lack of coordination: Secondary | ICD-10-CM

## 2020-10-21 DIAGNOSIS — R29898 Other symptoms and signs involving the musculoskeletal system: Secondary | ICD-10-CM | POA: Diagnosis not present

## 2020-10-21 DIAGNOSIS — M6281 Muscle weakness (generalized): Secondary | ICD-10-CM

## 2020-10-21 DIAGNOSIS — R2681 Unsteadiness on feet: Secondary | ICD-10-CM

## 2020-10-21 DIAGNOSIS — M542 Cervicalgia: Secondary | ICD-10-CM

## 2020-10-21 NOTE — Patient Instructions (Signed)
Access Code: [email or web address] URL: [email or web address] Date: 10/21/2020 Prepared by: Gustavus Bryant  Exercises Supine Bridge - 2 x daily - 7 x weekly - 2 sets - 10 reps Sit to Stand Without Arm Support - 2 x daily - 7 x weekly - 2 sets - 5 reps Tandem Stance with Support - 2 x daily - 7 x weekly - 2 sets - 10 reps Side Stepping with Counter Support - 2 x daily - 7 x weekly - 2 sets - 10 reps

## 2020-10-21 NOTE — Therapy (Signed)
[facility] [address] [place], [place], [address] Phone: [phone]   Fax:  [phone]  Physical Therapy Treatment  Patient Details  Name: Larry Riley MRN: [id number] Date of Birth: 01/31/1980 Referring Provider (PT): Dr. Mindi Junker   Encounter Date: 10/21/2020   PT End of Session - 10/21/20 1500     Visit Number 2    Number of Visits 17    Date for PT Re-Evaluation 12/03/20    Authorization Type Cigna (60v max combined)    Progress Note Due on Visit 10    PT Start Time 1445    PT Stop Time 1530    PT Time Calculation (min) 45 min    Behavior During Therapy [facility] for tasks assessed/performed             Past Medical History:  Diagnosis Date   GERD (gastroesophageal reflux disease) 01/21/2017   MRSA (methicillin resistant Staphylococcus aureus) 2005   leg   Neurofibromatosis    tumor down spine and brain    Past Surgical History:  Procedure Laterality Date   hydrocephalus     [place]unt 1999[place]   NECK SURGERY      tumor removal   SPINE SURGERY  10/2013   c2-t2 fusion  , laminectomy c1-6-- Dr Raynald Kemp-- [place]   VASECTOMY  02/2013    There were no vitals filed for this visit.   Subjective Assessment - 10/21/20 1449     Subjective Reports 6/10 pain but has been trying to remain active at home    Pertinent History C2-T2 cervical fusion, neurofibromatosis, L foot drop    Patient Stated Goals Walk at least 1 mile, to get in and out of chair easier without using arms too much,    Pain Onset More than a month ago    Pain Onset More than a month ago                               [facility] Adult PT Treatment/Exercise - 10/21/20 0001       Transfers   Transfers Sit to Stand;Stand to [employer] Transfers    Sit to Stand 5: Supervision    Stand to Sit 5: Supervision    Stand Pivot Transfers 5: Supervision    Comments 2x5 for HEP      Ambulation/Gait   Ambulation/Gait Yes     Ambulation Distance (Feet) 100 Feet    Assistive device None    Ambulation Surface Level;Indoor      Lumbar Exercises: Seated   Other Seated Lumbar Exercises latissimus press with inspiration, 10x      Lumbar Exercises: Supine   Bridge 10 reps    Bridge Limitations 2x10      Knee/Hip Exercises: Aerobic   Stepper scifit LEs only, L1, 8 min, no arms seat 15      Knee/Hip Exercises: Seated   Long Arc Quad Strengthening;Both;2 sets;10 reps;Limitations    Long Arc Quad Limitations alt with lat press    Marching Strengthening;Both;2 sets;10 reps;Limitations    Marching Limitations alt with lat press                 Balance Exercises - 10/21/20 0001       Balance Exercises: Standing   Sidestepping Upper extremity support;5 reps;Limitations    Sidestepping Limitations performed at counter  PT Short Term Goals - 10/08/20 1552       PT SHORT TERM GOAL #1   Title Patient Larry be able to ambulate 1050' without AD on level ground with SBA to improve short community ambulation    Baseline has not walked long since surgery    Time 4    Period Weeks    Status New    Target Date 11/05/20      PT SHORT TERM GOAL #2   Title Pt Larry demo 5x sit to stand score <20 seconds without HHA to improve functional strength in bil LE    Baseline 27 seconds (10/08/20)    Time 4    Period Weeks    Status New    Target Date 11/05/20      PT SHORT TERM GOAL #3   Title Patient Larry be able to ambulate 400 feet on grass without AD and SBA to improve safe community negotiation    Baseline not attempted after surgery (10/08/20)    Time 4    Period Weeks    Status New    Target Date 11/05/20               PT Long Term Goals - 10/08/20 1554       PT LONG TERM GOAL #1   Title Pt Larry demo <12 seconds with 5x sit to stand without HHA to improve functional strength in bil LE    Baseline 27 sec (10/08/20)    Time 8    Period Weeks    Status New    Target Date  12/03/20      PT LONG TERM GOAL #2   Title Pt Larry be able to ambulate 25 min without AD with walking program at home to improve walking endurance to access community    Baseline not attempted, walking program issued (10/08/20)    Time 8    Period Weeks    Status New    Target Date 12/03/20      PT LONG TERM GOAL #3   Title Patient Larry demo >20/30 on functional gait assessment to improve functional balance and gait    Baseline 9/30 (10/08/20)    Time 8    Period Weeks    Status New    Target Date 12/03/20                   Plan - 10/21/20 1522     Clinical Impression Statement Introduced HEP for strength adding strengthening tasks as well as balance activities.  Began aerobic tasks as well as core strengthening combined with LE exercises to improve overall function.  Nothing today irritated his upper back/cervical region.  Core weakness evident with sit/supine transitions.    Personal Factors and Comorbidities Comorbidity 3+    Comorbidities Neurofibromatosis, hx of cervical fusions, chronic neck pain, back pain, carpal tunnel syndrome, L foot drop    Examination-Activity Limitations Bend;Carry;Dressing;Lift;Reach Overhead;Sit;Sleep;Squat;Stairs;Stand;Transfers;Bathing    Examination-Participation Restrictions Cleaning;Community Activity;Driving;Laundry;Meal Prep;Occupation;Shop;Yard Work    Conservation officer, historic buildings Evolving/Moderate complexity    Rehab Potential Good    PT Frequency 2x / week    PT Duration 8 weeks    PT Treatment/Interventions ADLs/Self Care Home Management;Cryotherapy;Electrical Stimulation;Moist Heat;Traction;Gait training;Stair training;Functional mobility training;Therapeutic activities;Therapeutic exercise;Balance training;Neuromuscular re-education;Patient/family education;Orthotic Fit/Training;Manual techniques;Scar mobilization;Passive range of motion;Dry needling;Energy conservation;Spinal Manipulations;Joint Manipulations    PT Next Visit  Plan Work on functional strength, standing balance, gait training, core tasks    PT Home Exercise Plan Walking program,  [id number]    Consulted and Agree with Plan of Care Patient             Patient Larry benefit from skilled therapeutic intervention in order to improve the following deficits and impairments:  Abnormal gait, Decreased activity tolerance, Decreased balance, Decreased range of motion, Decreased mobility, Decreased endurance, Decreased skin integrity, Decreased strength, Difficulty walking, Increased fascial restricitons, Increased muscle spasms, Impaired flexibility, Impaired sensation, Impaired tone, Impaired UE functional use, Improper body mechanics, Postural dysfunction, Pain  Visit Diagnosis: Cervicalgia  Other lack of coordination  Unsteadiness on feet  Muscle weakness (generalized)     Problem List Patient Active Problem List   Diagnosis Date Noted   Abscess of right foot 08/16/2020   Cellulitis of right foot 08/16/2020   Microhematuria 08/03/2020   Gastroesophageal reflux disease 07/07/2019   External otitis of right ear 03/09/2017   Thumb laceration, right, initial encounter 03/09/2017   Weakness of left side of body 11/19/2016   Stress due to illness of family member 06/10/2016   Syncope 04/23/2016   Onychomycosis 04/23/2016   Impacted ear wax 04/23/2016   Constipation due to opioid therapy 06/13/2014   Chronic pain syndrome 02/21/2014   Allergic reaction 02/20/2014   Neurofibromatosis, type 1 (HCC) 12/14/2013   Status post cervical arthrodesis 11/21/2013   Cervical myelopathy (HCC) 09/21/2013   Preventative health care 06/23/2010   HORNER'S SYNDROME 04/12/2009   Other psoriasis 08/24/2007   Tinea pedis 08/24/2007    Hildred Laser, PT 10/21/2020, 3:29 PM  [facility] [address] [place], [place], [address] Phone: [phone]   Fax:  [phone]  Name: Larry Riley MRN:  [id number] Date of Birth: [birth year]

## 2020-10-23 ENCOUNTER — Ambulatory Visit: Payer: Managed Care, Other (non HMO) | Admitting: Occupational Therapy

## 2020-10-23 ENCOUNTER — Ambulatory Visit: Payer: Managed Care, Other (non HMO)

## 2020-10-23 ENCOUNTER — Other Ambulatory Visit: Payer: Self-pay

## 2020-10-23 ENCOUNTER — Encounter: Payer: Self-pay | Admitting: Occupational Therapy

## 2020-10-23 DIAGNOSIS — R29898 Other symptoms and signs involving the musculoskeletal system: Secondary | ICD-10-CM

## 2020-10-23 DIAGNOSIS — R293 Abnormal posture: Secondary | ICD-10-CM

## 2020-10-23 DIAGNOSIS — M542 Cervicalgia: Secondary | ICD-10-CM

## 2020-10-23 DIAGNOSIS — R2689 Other abnormalities of gait and mobility: Secondary | ICD-10-CM

## 2020-10-23 DIAGNOSIS — M6281 Muscle weakness (generalized): Secondary | ICD-10-CM

## 2020-10-23 DIAGNOSIS — R278 Other lack of coordination: Secondary | ICD-10-CM

## 2020-10-23 DIAGNOSIS — R208 Other disturbances of skin sensation: Secondary | ICD-10-CM

## 2020-10-23 DIAGNOSIS — R2681 Unsteadiness on feet: Secondary | ICD-10-CM

## 2020-10-23 NOTE — Patient Instructions (Signed)
  PICTURE OF WORK SET UP: Chair Hydrographic surveyor  -- how you are sitting at your desk --  For addressing and troubleshooting ergonomics  Flexor Tendon Gliding (Active Hook Fist)   With fingers and knuckles straight, bend middle and tip joints. Do not bend large knuckles. Repeat _10-15___ times. Do _4-6___ sessions per day.  MP Flexion (Active)   With back of hand on table, bend large knuckles as far as they will go, keeping small joints straight. Repeat _10-15___ times. Do __4-6__ sessions per day. Activity: Reach into a narrow container.*      Finger Flexion / Extension   With palm up, bend fingers of left hand toward palm, making a  fist. Straighten fingers, opening fist. Repeat sequence _10-15___ times per session. Do _4-6__ sessions per day. Hand Variation: Palm down   Copyright  VHI. All rights reserved.

## 2020-10-23 NOTE — Therapy (Signed)
[facility] [address] [place], [place], [address] Phone: [phone]   Fax:  [phone]  Occupational Therapy Treatment  Patient Details  Name: Larry Riley MRN: [id number] Date of Birth: 04/30/1980 Referring Provider (OT): Dr. Raynald Kemp   Encounter Date: 10/23/2020   OT End of Session - 10/23/20 1533     Visit Number 3    Number of Visits 25    Date for OT Re-Evaluation 01/01/21    Authorization Type cigna    Authorization - Visit Number 3    Authorization - Number of Visits 30   60 visits combined   OT Start Time 1531    OT Stop Time 1615    OT Time Calculation (min) 44 min    Activity Tolerance Patient tolerated treatment well    Behavior During Therapy Larry Riley for tasks assessed/performed             Past Medical History:  Diagnosis Date   GERD (gastroesophageal reflux disease) 01/21/2017   MRSA (methicillin resistant Staphylococcus aureus) 2005   leg   Neurofibromatosis    tumor down spine and brain    Past Surgical History:  Procedure Laterality Date   hydrocephalus     [place]unt 1999[place]   NECK SURGERY      tumor removal   SPINE SURGERY  10/2013   c2-t2 fusion  , laminectomy c1-6-- Dr Raynald Kemp-- [facility]   VASECTOMY  02/2013    There were no vitals filed for this visit.   Subjective Assessment - 10/23/20 1532     Subjective  my shoulders are a little bit sore since starting physical therapy on monday. Pt reports some soreness in neck d/t working it with exercises. Weakness and numbness feels a little weaker in RUE today per pt report    Pertinent History Larry Riley is a 40 y.o. 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 Raynald Kemp. PMH: Neurofibromatosis, type 1,   debulking of cervical spine tumor/Cervical athrodesis, Horners Syndrome, and hydrocephalus, numerous cervical surgeries over the years (6-7    Limitations No bending, lifting, twisting , pushing or  pulling over 10 lbs, limit overhead activity,  no driving til cleared by MD no strenuous exercise for 4 weeks   lifting restriction for 4 week    Currently in Pain? No/denies    Pain Score 0-No pain    Pain Onset --                Medium Pegs with RUE with mod difficulty and mod drops. Pt with difficulty d/t sensation, in hand manipulation and challenges with maintaining neutral wrist vs wrist flexion with finger movements.  Handwriting and tracing worksheets with built up foam grip. Issued tan foam grip for handwriting.  Issued RED foam grip for feeding utensils.  Trialed with beans an spoon with reported increased coordination and manipulation of utensil.                  OT Education - 10/23/20 1702     Education Details tendon glides and big hands for hand breaks with handwriting and typing.    Person(s) Educated Patient    Methods Explanation;Demonstration;Handout;Verbal cues    Comprehension Verbalized understanding;Returned demonstration;Verbal cues required              OT Short Term Goals - 10/08/20 1524       OT SHORT TERM GOAL #1   Title Pt will be I  with HEP -11/20/20    Time 6    Period Weeks    Status New    Target Date 11/20/20      OT SHORT TERM GOAL #2   Title Pt will demonstrate improved coordination for ADLs as evidenced by placing 9 pegs in 2 mins  or less for RUE    Baseline 6 pegs placed in 2 mins    Time 6    Period Weeks    Status New      OT SHORT TERM GOAL #3   Title Pt will perform bathing and dressing with min A    Time 6    Period Weeks    Status New      OT SHORT TERM GOAL #4   Title Pt will demonstrate understaning of adapted strategies/ AE to assist with ADLS (such as feeding, cutting food, using computer mouse.)    Time 6    Period Weeks    Status New      OT SHORT TERM GOAL #5   Title Pt will demonstrate improved coordination for ADLs as evidenced by decreasing 9 hole peg test to 50 secs or less for LUE     Baseline LUE 55.72 secs    Time 6    Period Weeks    Status New      OT SHORT TERM GOAL #6   Title Pt will demonstrate improved functional use of RUE as evidenced by increasing box/ blocks score to 37 blocks or greater.    Time 6    Period Weeks    Status New               OT Long Term Goals - 10/17/20 0913       OT LONG TERM GOAL #1   Title Pt will be I with upgraded HEP prn-01/02/20    Time 12    Period Weeks    Status New      OT LONG TERM GOAL #2   Title Pt will demonstrate improved coordination for ADLs as evidenced by completing 9 hole peg test in 90 secs or less with RUE.    Baseline 6 pegs placed in 2 mins    Time 12    Period Weeks    Status New      OT LONG TERM GOAL #3   Title Pt will perfrorm all basic ADLs modified indpendently    Time 12    Period Weeks    Status New      OT LONG TERM GOAL #4   Title Patient will be able to type with both hands within reasonable period of time in preparation for work activities.    Time 12    Period Weeks    Status New      OT LONG TERM GOAL #5   Title Pt will perform basic home management and cooking modified independently    Time 12    Period Weeks    Status New      OT LONG TERM GOAL #6   Title Pt will improve ability to fasten buttons as shown by being able to fasten/unfasten 3 buttons in less than 60 secs or less    Time 12    Period Weeks    Status New                   Plan - 10/23/20 1704     Clinical Impression Statement Pt with good response to foam grips  issued for built up handles for utensils for feeding and handwriting today.    OT Occupational Profile and History Problem Focused Assessment - Including review of records relating to presenting problem    Occupational performance deficits (Please refer to evaluation for details): ADL's;IADL's;Work;Leisure;Play;Rest and Sleep;Social Participation    Rehab Potential Good    Clinical Decision Making Several treatment options, min-mod  task modification necessary    Comorbidities Affecting Occupational Performance: May have comorbidities impacting occupational performance    Modification or Assistance to Complete Evaluation  Min-Moderate modification of tasks or assist with assess necessary to complete eval    OT Frequency 2x / week   plus eval, anticipate d/c after 8 weeks dependent on progress.   OT Duration 12 weeks    OT Treatment/Interventions Self-care/ADL training;Ultrasound;Energy conservation;Patient/family education;DME and/or AE instruction;Paraffin;Passive range of motion;Balance training;Cryotherapy;Fluidtherapy    Plan continue with coordination, typing, and ADL strategies    Consulted and Agree with Plan of Care Patient             Patient will benefit from skilled therapeutic intervention in order to improve the following deficits and impairments:           Visit Diagnosis: Cervicalgia  Other abnormalities of gait and mobility  Other lack of coordination  Muscle weakness (generalized)  Unsteadiness on feet  Other disturbances of skin sensation  Abnormal posture  Other symptoms and signs involving the musculoskeletal system    Problem List Patient Active Problem List   Diagnosis Date Noted   Abscess of right foot 08/16/2020   Cellulitis of right foot 08/16/2020   Microhematuria 08/03/2020   Gastroesophageal reflux disease 07/07/2019   External otitis of right ear 03/09/2017   Thumb laceration, right, initial encounter 03/09/2017   Weakness of left side of body 11/19/2016   Stress due to illness of family member 06/10/2016   Syncope 04/23/2016   Onychomycosis 04/23/2016   Impacted ear wax 04/23/2016   Constipation due to opioid therapy 06/13/2014   Chronic pain syndrome 02/21/2014   Allergic reaction 02/20/2014   Neurofibromatosis, type 1 (HCC) 12/14/2013   Status post cervical arthrodesis 11/21/2013   Cervical myelopathy (HCC) 09/21/2013   Preventative health care  06/23/2010   HORNER'S SYNDROME 04/12/2009   Other psoriasis 08/24/2007   Tinea pedis 08/24/2007    Junious Dresser, OT/L 10/23/2020, 5:05 PM  [facility] [address] [place], [place], [address] Phone: [phone]   Fax:  [phone]  Name: Larry Riley MRN: [id number] Date of Birth: [birth year]

## 2020-10-23 NOTE — Therapy (Signed)
[facility] [address] [place], [place], [address] Phone: [phone]   Fax:  [phone]  Physical Therapy Treatment  Patient Details  Name: Larry Riley MRN: [id number] Date of Birth: 12-14-1980 Referring Provider (PT): Dr. Mindi Junker   Encounter Date: 10/23/2020   PT End of Session - 10/23/20 1708     Visit Number 3    Number of Visits 17    Date for PT Re-Evaluation 12/03/20    Authorization Type C[employer] (60v max combined)    Progress Note Due on Visit 10    PT Start Time 1615    PT Stop Time 1700    PT Time Calculation (min) 45 min    Equipment Utilized During Treatment Gait belt    Activity Tolerance Patient tolerated treatment well    Behavior During Therapy [facility] for tasks assessed/performed             Past Medical History:  Diagnosis Date   GERD (gastroesophageal reflux disease) 01/21/2017   MRSA (methicillin resistant Staphylococcus aureus) 2005   leg   Neurofibromatosis    tumor down spine and brain    Past Surgical History:  Procedure Laterality Date   hydrocephalus     [place]unt 1999[place]   NECK SURGERY      tumor removal   SPINE SURGERY  10/2013   c2-t2 fusion  , laminectomy c1-6-- Dr Raynald Kemp-- [place]   VASECTOMY  02/2013    There were no vitals filed for this visit.   Subjective Assessment - 10/23/20 1716     Subjective No changes to note, compliant with HEP    Pertinent History C2-T2 cervical fusion, neurofibromatosis, L foot drop    Patient Stated Goals Walk at least 1 mile, to get in and out of chair easier without using arms too much,    Pain Onset More than a month ago    Pain Onset More than a month ago                               [facility] Adult PT Treatment/Exercise - 10/23/20 0001       Transfers   Transfers Sit to Stand    Sit to Stand 4: Min guard    Comments 10x from [employer].      Ambulation/Gait   Ambulation/Gait Yes    Ambulation Distance  (Feet) 230 Feet    Assistive device None    Gait Pattern Step-through pattern;Decreased hip/knee flexion - right    Ambulation Surface Level;Indoor    Gait Comments Stepping over orange stripes      Lumbar Exercises: Seated   Other Seated Lumbar Exercises seated core of hip/shoulder tosses, Vs and chops with 3.3# ball, 15x ea.      Knee/Hip Exercises: Aerobic   Stepper scifit LEs only, L4, 8 min, no arms seat 15      Knee/Hip Exercises: Seated   Long Arc Quad Strengthening;Both;2 sets;10 reps;Limitations    Long Arc Quad Weight 3 lbs.    Long Arc [employer] Limitations alt with lat press    Marching Strengthening;Both;2 sets;10 reps;Limitations    Marching Limitations alt with lat press    Marching Weights 3 lbs.                 Balance Exercises - 10/23/20 0001       Balance Exercises: Standing   Other Standing Exercises tall kneel sidestep 2x length of mat  followed by 2 min random ball toss/catch and 10 hip hinges                  PT Short Term Goals - 10/08/20 1552       PT SHORT TERM GOAL #1   Title Patient will be able to ambulate 1050' without AD on level ground with SBA to improve short community ambulation    Baseline has not walked long since surgery    Time 4    Period Weeks    Status New    Target Date 11/05/20      PT SHORT TERM GOAL #2   Title Pt will demo 5x sit to stand score <20 seconds without HHA to improve functional strength in bil LE    Baseline 27 seconds (10/08/20)    Time 4    Period Weeks    Status New    Target Date 11/05/20      PT SHORT TERM GOAL #3   Title Patient will be able to ambulate 400 feet on grass without AD and SBA to improve safe community negotiation    Baseline not attempted after surgery (10/08/20)    Time 4    Period Weeks    Status New    Target Date 11/05/20               PT Long Term Goals - 10/08/20 1554       PT LONG TERM GOAL #1   Title Pt will demo <12 seconds with 5x sit to stand without HHA  to improve functional strength in bil LE    Baseline 27 sec (10/08/20)    Time 8    Period Weeks    Status New    Target Date 12/03/20      PT LONG TERM GOAL #2   Title Pt will be able to ambulate 25 min without AD with walking program at home to improve walking endurance to access community    Baseline not attempted, walking program issued (10/08/20)    Time 8    Period Weeks    Status New    Target Date 12/03/20      PT LONG TERM GOAL #3   Title Patient will demo >20/30 on functional gait assessment to improve functional balance and gait    Baseline 9/30 (10/08/20)    Time 8    Period Weeks    Status New    Target Date 12/03/20                   Plan - 10/23/20 1655     Clinical Impression Statement todays session addressed cor estrengthening in seated positions involving resisted UE movements, seated core tasks with LE motions against resistance.  Increased resistance on Scifit.  Increased difficulty of gait by introducing stepping activities.  Added all kneel tasks of sidestepping, hip hinging and ball toss/catch for 2 min.    Personal Factors and Comorbidities Comorbidity 3+    Comorbidities Neurofibromatosis, hx of cervical fusions, chronic neck pain, back pain, carpal tunnel syndrome, L foot drop    Examination-Activity Limitations Bend;Carry;Dressing;Lift;Reach Overhead;Sit;Sleep;Squat;Stairs;Stand;Transfers;Bathing    Examination-Participation Restrictions Cleaning;Community Activity;Driving;Laundry;Meal Prep;Occupation;Shop;Yard Work    Conservation officer, historic buildings Evolving/Moderate complexity    Rehab Potential Good    PT Frequency 2x / week    PT Duration 8 weeks    PT Treatment/Interventions ADLs/Self Care Home Management;Cryotherapy;Electrical Stimulation;Moist Heat;Traction;Gait training;Stair training;Functional mobility training;Therapeutic activities;Therapeutic exercise;Balance training;Neuromuscular re-education;Patient/family education;Orthotic  Fit/Training;Manual techniques;Scar mobilization;Passive  range of motion;Dry needling;Energy conservation;Spinal Manipulations;Joint Manipulations    PT Next Visit Plan Work on functional strength, standing balance, gait training, core tasks    PT Home Exercise Plan Walking program, [id number]    Consulted and Agree with Plan of Care Patient             Patient will benefit from skilled therapeutic intervention in order to improve the following deficits and impairments:  Abnormal gait, Decreased activity tolerance, Decreased balance, Decreased range of motion, Decreased mobility, Decreased endurance, Decreased skin integrity, Decreased strength, Difficulty walking, Increased fascial restricitons, Increased muscle spasms, Impaired flexibility, Impaired sensation, Impaired tone, Impaired UE functional use, Improper body mechanics, Postural dysfunction, Pain  Visit Diagnosis: Cervicalgia  Unsteadiness on feet  Muscle weakness (generalized)  Other abnormalities of gait and mobility     Problem List Patient Active Problem List   Diagnosis Date Noted   Abscess of right foot 08/16/2020   Cellulitis of right foot 08/16/2020   Microhematuria 08/03/2020   Gastroesophageal reflux disease 07/07/2019   External otitis of right ear 03/09/2017   Thumb laceration, right, initial encounter 03/09/2017   Weakness of left side of body 11/19/2016   Stress due to illness of family member 06/10/2016   Syncope 04/23/2016   Onychomycosis 04/23/2016   Impacted ear wax 04/23/2016   Constipation due to opioid therapy 06/13/2014   Chronic pain syndrome 02/21/2014   Allergic reaction 02/20/2014   Neurofibromatosis, type 1 (HCC) 12/14/2013   Status post cervical arthrodesis 11/21/2013   Cervical myelopathy (HCC) 09/21/2013   Preventative health care 06/23/2010   HORNER'S SYNDROME 04/12/2009   Other psoriasis 08/24/2007   Tinea pedis 08/24/2007    Hildred Laser, PT 10/23/2020, 5:17 PM  [facility] [address] [place], [place], [address] Phone: [phone]   Fax:  [phone]  Name: Larry Riley MRN: [id number] Date of Birth: 1980-06-07

## 2020-10-25 ENCOUNTER — Ambulatory Visit: Payer: Managed Care, Other (non HMO) | Admitting: Occupational Therapy

## 2020-10-25 ENCOUNTER — Other Ambulatory Visit: Payer: Self-pay

## 2020-10-25 ENCOUNTER — Encounter: Payer: Self-pay | Admitting: Occupational Therapy

## 2020-10-25 DIAGNOSIS — R2681 Unsteadiness on feet: Secondary | ICD-10-CM

## 2020-10-25 DIAGNOSIS — R278 Other lack of coordination: Secondary | ICD-10-CM

## 2020-10-25 DIAGNOSIS — R29898 Other symptoms and signs involving the musculoskeletal system: Secondary | ICD-10-CM

## 2020-10-25 DIAGNOSIS — R208 Other disturbances of skin sensation: Secondary | ICD-10-CM

## 2020-10-25 DIAGNOSIS — M6281 Muscle weakness (generalized): Secondary | ICD-10-CM

## 2020-10-25 DIAGNOSIS — M542 Cervicalgia: Secondary | ICD-10-CM

## 2020-10-25 DIAGNOSIS — R2689 Other abnormalities of gait and mobility: Secondary | ICD-10-CM

## 2020-10-25 NOTE — Therapy (Signed)
[facility] [address] [place], [place], [address] Phone: [phone]   Fax:  [phone]  Occupational Therapy Treatment  Patient Details  Name: Larry Riley MRN: [id number] Date of Birth: [birth year] Referring Provider (OT): Dr. Raynald Kemp   Encounter Date: 10/25/2020   OT End of Session - 10/25/20 0804     Visit Number 4    Number of Visits 25    Date for OT Re-Evaluation 01/01/21    Authorization Type cigna    Authorization - Visit Number 4    Authorization - Number of Visits 30   60 visits combined   OT Start Time 0802    OT Stop Time 0845    OT Time Calculation (min) 43 min    Activity Tolerance Patient tolerated treatment well    Behavior During Therapy [facility] for tasks assessed/performed             Past Medical History:  Diagnosis Date   GERD (gastroesophageal reflux disease) 01/21/2017   MRSA (methicillin resistant Staphylococcus aureus) 2005   leg   Neurofibromatosis    tumor down spine and brain    Past Surgical History:  Procedure Laterality Date   hydrocephalus     [place]unt 1999[place]   NECK SURGERY      tumor removal   SPINE SURGERY  10/2013   c2-t2 fusion  , laminectomy c1-6-- Dr Raynald Kemp-- [place]   VASECTOMY  02/2013    There were no vitals filed for this visit.   Subjective Assessment - 10/25/20 0803     Subjective  "little stiff this morning still trying to wake up and medicine is starting to kick in"    Pertinent History Larry Riley is a 40 y.o. 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 Raynald Kemp. PMH: Neurofibromatosis, type 1,   debulking of cervical spine tumor/Cervical athrodesis, Horners Syndrome, and hydrocephalus, numerous cervical surgeries over the years (6-7    Limitations No bending, lifting, twisting , pushing or pulling over 10 lbs, limit overhead activity,  no driving til cleared by MD no strenuous exercise for 4 weeks   lifting  restriction for 4 week    Currently in Pain? Yes    Pain Score 6     Pain Location Shoulder    Pain Orientation Left    Pain Descriptors / Indicators Aching;Tightness    Pain Type Chronic pain    Pain Onset More than a month ago    Pain Frequency Constant    Aggravating Factors  waking up    Pain Relieving Factors stretch             Semicircle Pegboard with RUE with no difficulty min difficulty with white, mod with red and max difficulty with gray. Removed with RUE with emphasis on pincer grasp with holding dice.  Grooved Pegs with LUE with min difficulty and increased time. Removed with tweezers.  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   Typing Test 19 wpm 74% accuracy - net speed 14 wpm  ADLs pt reports using button up shirt in the living room to practice buttons currently.   Stringing Beads with stringing with RUE and holding bead with LUE. Min difficulty.   Handwriting/Tracing with RUE with foam grip and staying in between lines of maze (mouse to cheese) and following lines  with monkey sheet with minimal deviation. Tracing squares with min deviation.               OT Short Term Goals - 10/25/20 0805       OT SHORT TERM GOAL #1   Title Pt will be I with HEP -11/20/20    Time 6    Period Weeks    Status On-going    Target Date 11/20/20      OT SHORT TERM GOAL #2   Title Pt will demonstrate improved coordination for ADLs as evidenced by placing 9 pegs in 2 mins  or less for RUE    Baseline 6 pegs placed in 2 mins    Time 6    Period Weeks    Status On-going      OT SHORT TERM GOAL #3   Title Pt will perform bathing and dressing with min A    Time 6    Period Weeks    Status On-going      OT SHORT TERM GOAL #4   Title Pt will demonstrate understaning of adapted strategies/ AE to assist with ADLS (such as feeding, cutting food, using computer  mouse.)    Time 6    Period Weeks    Status On-going      OT SHORT TERM GOAL #5   Title Pt will demonstrate improved coordination for ADLs as evidenced by decreasing 9 hole peg test to 50 secs or less for LUE    Baseline LUE 55.72 secs    Time 6    Period Weeks    Status On-going      OT SHORT TERM GOAL #6   Title Pt will demonstrate improved functional use of RUE as evidenced by increasing box/ blocks score to 37 blocks or greater.    Time 6    Period Weeks    Status On-going               OT Long Term Goals - 10/17/20 0913       OT LONG TERM GOAL #1   Title Pt will be I with upgraded HEP prn-01/02/20    Time 12    Period Weeks    Status New      OT LONG TERM GOAL #2   Title Pt will demonstrate improved coordination for ADLs as evidenced by completing 9 hole peg test in 90 secs or less with RUE.    Baseline 6 pegs placed in 2 mins    Time 12    Period Weeks    Status New      OT LONG TERM GOAL #3   Title Pt will perfrorm all basic ADLs modified indpendently    Time 12    Period Weeks    Status New      OT LONG TERM GOAL #4   Title Patient will be able to type with both hands within reasonable period of time in preparation for work activities.    Time 12    Period Weeks    Status New      OT LONG TERM GOAL #5   Title Pt will perform basic home management and cooking modified independently    Time 12    Period Weeks    Status New      OT LONG TERM GOAL #6   Title Pt will improve ability to fasten buttons as shown by being able to fasten/unfasten 3 buttons in less than 60 secs or less  Time 12    Period Weeks    Status New                   Plan - 10/25/20 3009     Clinical Impression Statement Pt continues with difficulty with overall coordination in RUE but improving. Continue to progress towards goals.    OT Occupational Profile and History Problem Focused Assessment - Including review of records relating to presenting problem     Occupational performance deficits (Please refer to evaluation for details): ADL's;IADL's;Work;Leisure;Play;Rest and Sleep;Social Participation    Rehab Potential Good    Clinical Decision Making Several treatment options, min-mod task modification necessary    Comorbidities Affecting Occupational Performance: May have comorbidities impacting occupational performance    Modification or Assistance to Complete Evaluation  Min-Moderate modification of tasks or assist with assess necessary to complete eval    OT Frequency 2x / week   plus eval, anticipate d/c after 8 weeks dependent on progress.   OT Duration 12 weeks    OT Treatment/Interventions Self-care/ADL training;Ultrasound;Energy conservation;Patient/family education;DME and/or AE instruction;Paraffin;Passive range of motion;Balance training;Cryotherapy;Fluidtherapy    Plan continue with coordination, typing, and ADL strategies    Consulted and Agree with Plan of Care Patient             Patient will benefit from skilled therapeutic intervention in order to improve the following deficits and impairments:           Visit Diagnosis: Other abnormalities of gait and mobility  Other lack of coordination  Muscle weakness (generalized)  Unsteadiness on feet  Other disturbances of skin sensation  Other symptoms and signs involving the musculoskeletal system  Cervicalgia    Problem List Patient Active Problem List   Diagnosis Date Noted   Abscess of right foot 08/16/2020   Cellulitis of right foot 08/16/2020   Microhematuria 08/03/2020   Gastroesophageal reflux disease 07/07/2019   External otitis of right ear 03/09/2017   Thumb laceration, right, initial encounter 03/09/2017   Weakness of left side of body 11/19/2016   Stress due to illness of family member 06/10/2016   Syncope 04/23/2016   Onychomycosis 04/23/2016   Impacted ear wax 04/23/2016   Constipation due to opioid therapy 06/13/2014   Chronic pain syndrome  02/21/2014   Allergic reaction 02/20/2014   Neurofibromatosis, type 1 (HCC) 12/14/2013   Status post cervical arthrodesis 11/21/2013   Cervical myelopathy (HCC) 09/21/2013   Preventative health care 06/23/2010   HORNER'S SYNDROME 04/12/2009   Other psoriasis 08/24/2007   Tinea pedis 08/24/2007    Junious Dresser, OT/L 10/25/2020, 8:18 AM  [facility] [address] [place], [place], [address] Phone: [phone]   Fax:  [phone]  Name: Gorge Almanza MRN: [id number] Date of Birth: Jun 30, 1980

## 2020-10-29 ENCOUNTER — Ambulatory Visit: Payer: Managed Care, Other (non HMO)

## 2020-10-29 ENCOUNTER — Ambulatory Visit: Payer: Managed Care, Other (non HMO) | Admitting: Occupational Therapy

## 2020-10-31 ENCOUNTER — Ambulatory Visit: Payer: Managed Care, Other (non HMO) | Admitting: Occupational Therapy

## 2020-10-31 ENCOUNTER — Other Ambulatory Visit: Payer: Self-pay

## 2020-10-31 ENCOUNTER — Ambulatory Visit: Payer: Managed Care, Other (non HMO)

## 2020-10-31 DIAGNOSIS — R29898 Other symptoms and signs involving the musculoskeletal system: Secondary | ICD-10-CM

## 2020-10-31 DIAGNOSIS — M6281 Muscle weakness (generalized): Secondary | ICD-10-CM

## 2020-10-31 DIAGNOSIS — R278 Other lack of coordination: Secondary | ICD-10-CM

## 2020-10-31 DIAGNOSIS — R208 Other disturbances of skin sensation: Secondary | ICD-10-CM

## 2020-10-31 DIAGNOSIS — R2681 Unsteadiness on feet: Secondary | ICD-10-CM

## 2020-10-31 DIAGNOSIS — R2689 Other abnormalities of gait and mobility: Secondary | ICD-10-CM

## 2020-10-31 DIAGNOSIS — R293 Abnormal posture: Secondary | ICD-10-CM

## 2020-10-31 NOTE — Patient Instructions (Addendum)
10/31/20   Dr. Raynald Kemp,  [name] is receiving PT and OT at our site.  Currently we are aware of a lifting and pushing / pulling restriction of 10 lbs and back precaution(no lifting , bending and twisting). Please let us know any updated precautions,  so that he may progress in therapy.   Thanks, [employer], OTR/L

## 2020-10-31 NOTE — Therapy (Signed)
[facility] [address] [place], [place], [address] Phone: [phone]   Fax:  [phone]  Physical Therapy Treatment  Patient Details  Name: Larry Riley MRN: [id number] Date of Birth: 1980-03-04 Referring Provider (PT): Dr. Mindi Junker   Encounter Date: 10/31/2020   PT End of Session - 10/31/20 1230     Visit Number 4    Number of Visits 17    Date for PT Re-Evaluation 12/03/20    Authorization Type Cigna (60v max combined)    Progress Note Due on Visit 10    PT Start Time 1230    PT Stop Time 1315    PT Time Calculation (min) 45 min    Equipment Utilized During Treatment Gait belt    Activity Tolerance Patient tolerated treatment well    Behavior During Therapy [facility] for tasks assessed/performed             Past Medical History:  Diagnosis Date   GERD (gastroesophageal reflux disease) 01/21/2017   MRSA (methicillin resistant Staphylococcus aureus) 2005   leg   Neurofibromatosis    tumor down spine and brain    Past Surgical History:  Procedure Laterality Date   hydrocephalus     [place]unt 1999[place]   NECK SURGERY      tumor removal   SPINE SURGERY  10/2013   c2-t2 fusion  , laminectomy c1-6-- Dr Raynald Kemp-- [place]   VASECTOMY  02/2013    There were no vitals filed for this visit.   Subjective Assessment - 10/31/20 1253     Subjective No changes to note, compliant with HEP, has increased his activity as tolerated    Pertinent History C2-T2 cervical fusion, neurofibromatosis, L foot drop    Patient Stated Goals Walk at least 1 mile, to get in and out of chair easier without using arms too much,    Pain Onset More than a month ago    Pain Onset More than a month ago                               [facility] Adult PT Treatment/Exercise - 10/31/20 0001       Lumbar Exercises: Seated   Other Seated Lumbar Exercises seated core of hip/shoulder tosses, Vs and chops with 4.4# ball, 10x  ea.      Knee/Hip Exercises: Aerobic   Stepper scifit LEs only, L4, 8 min, no arms seat 15      Knee/Hip Exercises: Seated   Long Arc Quad Strengthening;Both;2 sets;15 reps    Long Arc Quad Weight 3 lbs.    Long Arc [employer] Limitations alt with lat press    Marching Strengthening;Both;2 sets;15 reps    Marching Limitations alt with lat press    Marching Weights 3 lbs.                 Balance Exercises - 10/31/20 0001       Balance Exercises: Standing   Step Ups Forward;Lateral;2 inch;UE support 2;Limitations    Step Ups Limitations step downs and lateral steps 10x per leg                  PT Short Term Goals - 10/08/20 1552       PT SHORT TERM GOAL #1   Title Patient will be able to ambulate 1050' without AD on level ground with SBA to improve short community ambulation    Baseline has  not walked long since surgery    Time 4    Period Weeks    Status New    Target Date 11/05/20      PT SHORT TERM GOAL #2   Title Pt will demo 5x sit to stand score <20 seconds without HHA to improve functional strength in bil LE    Baseline 27 seconds (10/08/20)    Time 4    Period Weeks    Status New    Target Date 11/05/20      PT SHORT TERM GOAL #3   Title Patient will be able to ambulate 400 feet on grass without AD and SBA to improve safe community negotiation    Baseline not attempted after surgery (10/08/20)    Time 4    Period Weeks    Status New    Target Date 11/05/20               PT Long Term Goals - 10/08/20 1554       PT LONG TERM GOAL #1   Title Pt will demo <12 seconds with 5x sit to stand without HHA to improve functional strength in bil LE    Baseline 27 sec (10/08/20)    Time 8    Period Weeks    Status New    Target Date 12/03/20      PT LONG TERM GOAL #2   Title Pt will be able to ambulate 25 min without AD with walking program at home to improve walking endurance to access community    Baseline not attempted, walking program issued  (10/08/20)    Time 8    Period Weeks    Status New    Target Date 12/03/20      PT LONG TERM GOAL #3   Title Patient will demo >20/30 on functional gait assessment to improve functional balance and gait    Baseline 9/30 (10/08/20)    Time 8    Period Weeks    Status New    Target Date 12/03/20                   Plan - 10/31/20 1254     Clinical Impression Statement Continued with LE and core strengthening, increased weight and reps as noted, added OH reach with STSs on foam.  Introduced step downs to improve L hip strength and control and prevent excess adduction.  Less overall muscle soreness reported but contnued proprioceptive deficits identified in LLE    Personal Factors and Comorbidities Comorbidity 3+    Comorbidities Neurofibromatosis, hx of cervical fusions, chronic neck pain, back pain, carpal tunnel syndrome, L foot drop    Examination-Activity Limitations Bend;Carry;Dressing;Lift;Reach Overhead;Sit;Sleep;Squat;Stairs;Stand;Transfers;Bathing    Examination-Participation Restrictions Cleaning;Community Activity;Driving;Laundry;Meal Prep;Occupation;Shop;Yard Work    Conservation officer, historic buildings Evolving/Moderate complexity    Rehab Potential Good    PT Frequency 2x / week    PT Duration 8 weeks    PT Treatment/Interventions ADLs/Self Care Home Management;Cryotherapy;Electrical Stimulation;Moist Heat;Traction;Gait training;Stair training;Functional mobility training;Therapeutic activities;Therapeutic exercise;Balance training;Neuromuscular re-education;Patient/family education;Orthotic Fit/Training;Manual techniques;Scar mobilization;Passive range of motion;Dry needling;Energy conservation;Spinal Manipulations;Joint Manipulations    PT Next Visit Plan standing balance, gait training, core tasks, tall kneeling and L hip stabilization tasks    PT Home Exercise Plan Walking program, [id number]    Consulted and Agree with Plan of Care Patient             Patient  will benefit from skilled therapeutic intervention in order to improve the following deficits and  impairments:  Abnormal gait, Decreased activity tolerance, Decreased balance, Decreased range of motion, Decreased mobility, Decreased endurance, Decreased skin integrity, Decreased strength, Difficulty walking, Increased fascial restricitons, Increased muscle spasms, Impaired flexibility, Impaired sensation, Impaired tone, Impaired UE functional use, Improper body mechanics, Postural dysfunction, Pain  Visit Diagnosis: Other abnormalities of gait and mobility  Other lack of coordination  Muscle weakness (generalized)  Unsteadiness on feet     Problem List Patient Active Problem List   Diagnosis Date Noted   Abscess of right foot 08/16/2020   Cellulitis of right foot 08/16/2020   Microhematuria 08/03/2020   Gastroesophageal reflux disease 07/07/2019   External otitis of right ear 03/09/2017   Thumb laceration, right, initial encounter 03/09/2017   Weakness of left side of body 11/19/2016   Stress due to illness of family member 06/10/2016   Syncope 04/23/2016   Onychomycosis 04/23/2016   Impacted ear wax 04/23/2016   Constipation due to opioid therapy 06/13/2014   Chronic pain syndrome 02/21/2014   Allergic reaction 02/20/2014   Neurofibromatosis, type 1 (HCC) 12/14/2013   Status post cervical arthrodesis 11/21/2013   Cervical myelopathy (HCC) 09/21/2013   Preventative health care 06/23/2010   HORNER'S SYNDROME 04/12/2009   Other psoriasis 08/24/2007   Tinea pedis 08/24/2007    Hildred Laser, PT 10/31/2020, 1:53 PM  [facility] [address] [place], [place], [address] Phone: [phone]   Fax:  [phone]  Name: Larry Riley MRN: [id number] Date of Birth: [birth year]

## 2020-11-01 NOTE — Therapy (Signed)
[facility] [address] [place], [place], [address] Phone: [phone]   Fax:  [phone]  Occupational Therapy Treatment  Patient Details  Name: Larry Riley MRN: [id number] Date of Birth: May 24, 1980 Referring Provider (OT): Dr. Raynald Kemp   Encounter Date: 10/31/2020   OT End of Session - 10/31/20 1319     Visit Number 5    Number of Visits 25    Date for OT Re-Evaluation 01/01/21    Authorization Type cigna    Authorization - Visit Number 5    Authorization - Number of Visits 30    OT Start Time 1317    OT Stop Time 1357    OT Time Calculation (min) 40 min             Past Medical History:  Diagnosis Date   GERD (gastroesophageal reflux disease) 01/21/2017   MRSA (methicillin resistant Staphylococcus aureus) 2005   leg   Neurofibromatosis    tumor down spine and brain    Past Surgical History:  Procedure Laterality Date   hydrocephalus     [place]unt 1999[place]   NECK SURGERY      tumor removal   SPINE SURGERY  10/2013   c2-t2 fusion  , laminectomy c1-6-- Dr Raynald Kemp-- [place]   VASECTOMY  02/2013    There were no vitals filed for this visit.   Subjective Assessment - 10/31/20 1318     Pertinent History Larry Riley is a 40 y.o. 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 Raynald Kemp. PMH: Neurofibromatosis, type 1,   debulking of cervical spine tumor/Cervical athrodesis, Horners Syndrome, and hydrocephalus, numerous cervical surgeries over the years (6-7    Limitations No bending, lifting, twisting , pushing or pulling over 10 lbs, limit overhead activity,  no driving til cleared by MD no strenuous exercise for 4 weeks   lifting restriction for 4 week    Currently in Pain? Yes    Pain Location Shoulder    Pain Descriptors / Indicators Aching    Pain Type Chronic pain    Pain Onset More than a month ago    Pain Frequency Constant    Aggravating Factors  activity     Pain Relieving Factors rest                   Treatment: copying small peg design with left and right UE's, min-mod difficulty and min v.c Typing activities for increased speed and dexterity, min difficulty Note sent with pt to take to MD regarding precautions.              OT Education - 11/01/20 1359     Education Details reviewed previously issued coordination HEP, min v.c    Person(s) Educated Patient    Methods Explanation;Demonstration;Verbal cues    Comprehension Verbalized understanding;Returned demonstration;Verbal cues required              OT Short Term Goals - 10/31/20 1321       OT SHORT TERM GOAL #1   Title Pt will be I with HEP -11/20/20    Time 6    Period Weeks    Status On-going    Target Date 11/20/20      OT SHORT TERM GOAL #2   Title Pt will demonstrate improved coordination for ADLs as evidenced by placing 9 pegs in 2 mins  or less for RUE    Baseline 6 pegs placed in 2  mins    Time 6    Period Weeks    Status On-going      OT SHORT TERM GOAL #3   Title Pt will perform bathing and dressing with min A    Time 6    Period Weeks    Status On-going      OT SHORT TERM GOAL #4   Title Pt will demonstrate understaning of adapted strategies/ AE to assist with ADLS (such as feeding, cutting food, using computer mouse.)    Time 6    Period Weeks    Status On-going      OT SHORT TERM GOAL #5   Title Pt will demonstrate improved coordination for ADLs as evidenced by decreasing 9 hole peg test to 50 secs or less for LUE    Baseline LUE 55.72 secs    Time 6    Period Weeks    Status On-going      OT SHORT TERM GOAL #6   Title Pt will demonstrate improved functional use of RUE as evidenced by increasing box/ blocks score to 37 blocks or greater.    Time 6    Period Weeks    Status On-going               OT Long Term Goals - 10/31/20 1322       OT LONG TERM GOAL #1   Title Pt will be I with upgraded HEP prn-01/02/20     Time 12    Period Weeks    Status New      OT LONG TERM GOAL #2   Title Pt will demonstrate improved coordination for ADLs as evidenced by completing 9 hole peg test in 90 secs or less with RUE.    Baseline 6 pegs placed in 2 mins    Time 12    Period Weeks    Status New      OT LONG TERM GOAL #3   Title Pt will perfrorm all basic ADLs modified indpendently    Time 12    Period Weeks    Status New      OT LONG TERM GOAL #4   Title Patient will be able to type with both hands within reasonable period of time in preparation for work activities.    Time 12    Period Weeks    Status New      OT LONG TERM GOAL #5   Title Pt will perform basic home management and cooking modified independently    Time 12    Period Weeks    Status New      OT LONG TERM GOAL #6   Title Pt will improve ability to fasten buttons as shown by being able to fasten/unfasten 3 buttons in less than 60 secs or less    Time 12    Period Weeks    Status New                   Plan - 11/01/20 1359     Clinical Impression Statement Pt  is progressing towards goals. He reports typing is now a little easier.    OT Occupational Profile and History Problem Focused Assessment - Including review of records relating to presenting problem    Occupational performance deficits (Please refer to evaluation for details): ADL's;IADL's;Work;Leisure;Play;Rest and Sleep;Social Participation    Rehab Potential Good    Clinical Decision Making Several treatment options, min-mod task modification necessary    Comorbidities Affecting  Occupational Performance: May have comorbidities impacting occupational performance    Modification or Assistance to Complete Evaluation  Min-Moderate modification of tasks or assist with assess necessary to complete eval    OT Frequency 2x / week   plus eval, anticipate d/c after 8 weeks dependent on progress.   OT Duration 12 weeks    OT Treatment/Interventions Self-care/ADL  training;Ultrasound;Energy conservation;Patient/family education;DME and/or AE instruction;Paraffin;Passive range of motion;Balance training;Cryotherapy;Fluidtherapy    Plan continue with coordination, typing, and ADL strategies    Consulted and Agree with Plan of Care Patient             Patient will benefit from skilled therapeutic intervention in order to improve the following deficits and impairments:           Visit Diagnosis: Muscle weakness (generalized)  Unsteadiness on feet  Other disturbances of skin sensation  Other symptoms and signs involving the musculoskeletal system  Other lack of coordination  Abnormal posture    Problem List Patient Active Problem List   Diagnosis Date Noted   Abscess of right foot 08/16/2020   Cellulitis of right foot 08/16/2020   Microhematuria 08/03/2020   Gastroesophageal reflux disease 07/07/2019   External otitis of right ear 03/09/2017   Thumb laceration, right, initial encounter 03/09/2017   Weakness of left side of body 11/19/2016   Stress due to illness of family member 06/10/2016   Syncope 04/23/2016   Onychomycosis 04/23/2016   Impacted ear wax 04/23/2016   Constipation due to opioid therapy 06/13/2014   Chronic pain syndrome 02/21/2014   Allergic reaction 02/20/2014   Neurofibromatosis, type 1 (HCC) 12/14/2013   Status post cervical arthrodesis 11/21/2013   Cervical myelopathy (HCC) 09/21/2013   Preventative health care 06/23/2010   HORNER'S SYNDROME 04/12/2009   Other psoriasis 08/24/2007   Tinea pedis 08/24/2007    Ananth Fiallos, OT/L 11/01/2020, 2:00 PM  [facility] [address] [place], [place], [address] Phone: [phone]   Fax:  [phone]  Name: Markez Dowland MRN: [id number] Date of Birth: 02-Apr-1980

## 2020-11-05 ENCOUNTER — Ambulatory Visit: Payer: Managed Care, Other (non HMO) | Admitting: Occupational Therapy

## 2020-11-05 ENCOUNTER — Ambulatory Visit: Payer: Managed Care, Other (non HMO)

## 2020-11-05 ENCOUNTER — Other Ambulatory Visit: Payer: Self-pay

## 2020-11-05 ENCOUNTER — Encounter: Payer: Self-pay | Admitting: Occupational Therapy

## 2020-11-05 DIAGNOSIS — R278 Other lack of coordination: Secondary | ICD-10-CM

## 2020-11-05 DIAGNOSIS — R2689 Other abnormalities of gait and mobility: Secondary | ICD-10-CM

## 2020-11-05 DIAGNOSIS — R2681 Unsteadiness on feet: Secondary | ICD-10-CM

## 2020-11-05 DIAGNOSIS — M6281 Muscle weakness (generalized): Secondary | ICD-10-CM

## 2020-11-05 DIAGNOSIS — R29898 Other symptoms and signs involving the musculoskeletal system: Secondary | ICD-10-CM | POA: Diagnosis not present

## 2020-11-05 DIAGNOSIS — R208 Other disturbances of skin sensation: Secondary | ICD-10-CM

## 2020-11-05 NOTE — Therapy (Signed)
[facility] [address] [place], [place], [address] Phone: [phone]   Fax:  [phone]  Physical Therapy Treatment  Patient Details  Name: Larry Riley MRN: [id number] Date of Birth: 1980/11/14 Referring Provider (PT): Dr. Mindi Junker   Encounter Date: 11/05/2020   PT End of Session - 11/05/20 1545     Visit Number 5    Number of Visits 17    Date for PT Re-Evaluation 12/03/20    Authorization Type [employer] (60v max combined)    Authorization Time Period 9/27-11/22    Progress Note Due on Visit 10    PT Start Time 1535    PT Stop Time 1620    PT Time Calculation (min) 45 min    Equipment Utilized During Treatment Gait belt    Activity Tolerance Patient tolerated treatment well    Behavior During Therapy [facility] for tasks assessed/performed             Past Medical History:  Diagnosis Date   GERD (gastroesophageal reflux disease) 01/21/2017   MRSA (methicillin resistant Staphylococcus aureus) 2005   leg   Neurofibromatosis    tumor down spine and brain    Past Surgical History:  Procedure Laterality Date   hydrocephalus     shunt 1999[place]   NECK SURGERY      tumor removal   SPINE SURGERY  10/2013   c2-t2 fusion  , laminectomy c1-6-- Dr Raynald Kemp-- [place]   VASECTOMY  02/2013    There were no vitals filed for this visit.   Subjective Assessment - 11/05/20 1536     Subjective No new c/o, some neck pain today due to not taking meds    Pertinent History C2-T2 cervical fusion, neurofibromatosis, L foot drop    Patient Stated Goals Walk at least 1 mile, to get in and out of chair easier without using arms too much,    Pain Onset More than a month ago    Pain Onset More than a month ago                               [facility] Adult PT Treatment/Exercise - 11/05/20 0001       Transfers   Transfers Sit to Stand    Sit to Stand 5: Supervision;4: Min guard    Stand to Sit 5:  Supervision    Stand Pivot Transfers 5: Supervision    Comments 10x from airex holding 5.5# ball      Ambulation/Gait   Ambulation/Gait Yes    Ambulation/Gait Assistance 5: Supervision    Ambulation Distance (Feet) 200 Feet    Assistive device None    Gait Pattern Step-through pattern;Decreased hip/knee flexion - right    Ambulation Surface Level;Indoor      Lumbar Exercises: Seated   Other Seated Lumbar Exercises seated core of hip/shoulder tosses, Vs and chops with 5.5# ball, 10x ea.      Lumbar Exercises: Supine   Bridge with [employer] Squeeze Non-compliant;15 reps;Limitations    Bridge with [employer] Limitations 2x15 with 2.2# ball    Other Supine Lumbar Exercises DKTC with 2.2# ball squeeze 15x      Knee/Hip Exercises: Aerobic   Stepper scifit LEs only, L4, 8 min, no arms seat 15                 Balance Exercises - 11/05/20 0001       Balance Exercises: Standing  Step Ups Forward;Lateral;4 inch;Limitations    Step Ups Limitations step downs and lateral steps 15x per leg                  PT Short Term Goals - 10/08/20 1552       PT SHORT TERM GOAL #1   Title Patient will be able to ambulate 1050' without AD on level ground with SBA to improve short community ambulation    Baseline has not walked long since surgery    Time 4    Period Weeks    Status New    Target Date 11/05/20      PT SHORT TERM GOAL #2   Title Pt will demo 5x sit to stand score <20 seconds without HHA to improve functional strength in bil LE    Baseline 27 seconds (10/08/20)    Time 4    Period Weeks    Status New    Target Date 11/05/20      PT SHORT TERM GOAL #3   Title Patient will be able to ambulate 400 feet on grass without AD and SBA to improve safe community negotiation    Baseline not attempted after surgery (10/08/20)    Time 4    Period Weeks    Status New    Target Date 11/05/20               PT Long Term Goals - 10/08/20 1554       PT LONG TERM GOAL  #1   Title Pt will demo <12 seconds with 5x sit to stand without HHA to improve functional strength in bil LE    Baseline 27 sec (10/08/20)    Time 8    Period Weeks    Status New    Target Date 12/03/20      PT LONG TERM GOAL #2   Title Pt will be able to ambulate 25 min without AD with walking program at home to improve walking endurance to access community    Baseline not attempted, walking program issued (10/08/20)    Time 8    Period Weeks    Status New    Target Date 12/03/20      PT LONG TERM GOAL #3   Title Patient will demo >20/30 on functional gait assessment to improve functional balance and gait    Baseline 9/30 (10/08/20)    Time 8    Period Weeks    Status New    Target Date 12/03/20                   Plan - 11/05/20 1658     Clinical Impression Statement Todays session focused on LE and core strengthening followed by aerobic conditioning and stepping tasks for strength, balance and coordination.  Increased difficulty of bridges and added DKTC with ball squeeze for additional core strengthening.  Some balance challenges observed with stepping tasks requiring UE support.    Personal Factors and Comorbidities Comorbidity 3+    Comorbidities Neurofibromatosis, hx of cervical fusions, chronic neck pain, back pain, carpal tunnel syndrome, L foot drop    Examination-Activity Limitations Bend;Carry;Dressing;Lift;Reach Overhead;Sit;Sleep;Squat;Stairs;Stand;Transfers;Bathing    Examination-Participation Restrictions Cleaning;Community Activity;Driving;Laundry;Meal Prep;Occupation;Shop;Yard Work    Conservation officer, historic buildings Evolving/Moderate complexity    Rehab Potential Good    PT Frequency 2x / week    PT Duration 8 weeks    PT Treatment/Interventions ADLs/Self Care Home Management;Cryotherapy;Electrical Stimulation;Moist Heat;Traction;Gait training;Stair training;Functional mobility training;Therapeutic activities;Therapeutic exercise;Balance  training;Neuromuscular re-education;Patient/family  education;Orthotic Fit/Training;Manual techniques;Scar mobilization;Passive range of motion;Dry needling;Energy conservation;Spinal Manipulations;Joint Manipulations    PT Next Visit Plan standing balance, stepping, gait training, core tasks, tall kneeling and L hip stabilization tasks as tolerated, check STGs    PT Home Exercise Plan Walking program, [id number]    Consulted and Agree with Plan of Care Patient             Patient will benefit from skilled therapeutic intervention in order to improve the following deficits and impairments:  Abnormal gait, Decreased activity tolerance, Decreased balance, Decreased range of motion, Decreased mobility, Decreased endurance, Decreased skin integrity, Decreased strength, Difficulty walking, Increased fascial restricitons, Increased muscle spasms, Impaired flexibility, Impaired sensation, Impaired tone, Impaired UE functional use, Improper body mechanics, Postural dysfunction, Pain  Visit Diagnosis: Other abnormalities of gait and mobility  Other lack of coordination  Muscle weakness (generalized)  Unsteadiness on feet     Problem List Patient Active Problem List   Diagnosis Date Noted   Abscess of right foot 08/16/2020   Cellulitis of right foot 08/16/2020   Microhematuria 08/03/2020   Gastroesophageal reflux disease 07/07/2019   External otitis of right ear 03/09/2017   Thumb laceration, right, initial encounter 03/09/2017   Weakness of left side of body 11/19/2016   Stress due to illness of family member 06/10/2016   Syncope 04/23/2016   Onychomycosis 04/23/2016   Impacted ear wax 04/23/2016   Constipation due to opioid therapy 06/13/2014   Chronic pain syndrome 02/21/2014   Allergic reaction 02/20/2014   Neurofibromatosis, type 1 (HCC) 12/14/2013   Status post cervical arthrodesis 11/21/2013   Cervical myelopathy (HCC) 09/21/2013   Preventative health care 06/23/2010    HORNER'S SYNDROME 04/12/2009   Other psoriasis 08/24/2007   Tinea pedis 08/24/2007    Hildred Laser, PT 11/05/2020, 5:12 PM  [facility] [address] [place], [place], [address] Phone: [phone]   Fax:  [phone]  Name: Herold Salguero MRN: [id number] Date of Birth: 23-Feb-1980

## 2020-11-05 NOTE — Therapy (Signed)
[facility] [address] [place], [place], [address] Phone: [phone]   Fax:  [phone]  Occupational Therapy Treatment  Patient Details  Name: Larry Riley MRN: [id number] Date of Birth: July 25, 1980 Referring Provider (OT): Dr. Raynald Kemp   Encounter Date: 11/05/2020   OT End of Session - 11/05/20 1622     Visit Number 6    Number of Visits 25    Date for OT Re-Evaluation 01/01/21    Authorization Type cigna    Authorization - Visit Number 6    Authorization - Number of Visits 30    OT Start Time 1620    OT Stop Time 1700    OT Time Calculation (min) 40 min    Activity Tolerance Patient tolerated treatment well    Behavior During Therapy [facility] for tasks assessed/performed             Past Medical History:  Diagnosis Date   GERD (gastroesophageal reflux disease) 01/21/2017   MRSA (methicillin resistant Staphylococcus aureus) 2005   leg   Neurofibromatosis    tumor down spine and brain    Past Surgical History:  Procedure Laterality Date   hydrocephalus     [place]unt 1999[place]   NECK SURGERY      tumor removal   SPINE SURGERY  10/2013   c2-t2 fusion  , laminectomy c1-6-- Dr Raynald Kemp-- [place]   VASECTOMY  02/2013    There were no vitals filed for this visit.   Subjective Assessment - 11/05/20 1620     Subjective  "tomorrow is my appt with the surgeon and i am excited to go and take my note and see what he says"    Pertinent History Larry Endicott is a 40 y.o. 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 Raynald Kemp. PMH: Neurofibromatosis, type 1,   debulking of cervical spine tumor/Cervical athrodesis, Horners Syndrome, and hydrocephalus, numerous cervical surgeries over the years (6-7    Limitations No bending, lifting, twisting , pushing or pulling over 10 lbs, limit overhead activity,  no driving til cleared by MD no strenuous exercise for 4 weeks   lifting restriction for 4  week    Currently in Pain? Yes    Pain Score 4     Pain Location Shoulder   neck, shoulder and lower back   Pain Orientation Left    Pain Descriptors / Indicators Aching    Pain Type Chronic pain    Pain Onset More than a month ago    Pain Frequency Constant              Grooved Pegs RUE with mod/max difficulty, drops. Switched to LUE and completed with min difficulty. Switched back to RUE for completing again with moderate difficulty - demonstrated improved coordination.  Handwriting pt benefited from weighted utensil for writing with RUE and from rubber band input. Trace the shapes and ABCs.                      OT Short Term Goals - 10/31/20 1321       OT SHORT TERM GOAL #1   Title Pt will be I with HEP -11/20/20    Time 6    Period Weeks    Status On-going    Target Date 11/20/20      OT SHORT TERM GOAL #2   Title Pt will demonstrate improved coordination for ADLs as evidenced by placing 9  pegs in 2 mins  or less for RUE    Baseline 6 pegs placed in 2 mins    Time 6    Period Weeks    Status On-going      OT SHORT TERM GOAL #3   Title Pt will perform bathing and dressing with min A    Time 6    Period Weeks    Status On-going      OT SHORT TERM GOAL #4   Title Pt will demonstrate understaning of adapted strategies/ AE to assist with ADLS (such as feeding, cutting food, using computer mouse.)    Time 6    Period Weeks    Status On-going      OT SHORT TERM GOAL #5   Title Pt will demonstrate improved coordination for ADLs as evidenced by decreasing 9 hole peg test to 50 secs or less for LUE    Baseline LUE 55.72 secs    Time 6    Period Weeks    Status On-going      OT SHORT TERM GOAL #6   Title Pt will demonstrate improved functional use of RUE as evidenced by increasing box/ blocks score to 37 blocks or greater.    Time 6    Period Weeks    Status On-going               OT Long Term Goals - 10/31/20 1322       OT LONG TERM  GOAL #1   Title Pt will be I with upgraded HEP prn-01/02/20    Time 12    Period Weeks    Status New      OT LONG TERM GOAL #2   Title Pt will demonstrate improved coordination for ADLs as evidenced by completing 9 hole peg test in 90 secs or less with RUE.    Baseline 6 pegs placed in 2 mins    Time 12    Period Weeks    Status New      OT LONG TERM GOAL #3   Title Pt will perfrorm all basic ADLs modified indpendently    Time 12    Period Weeks    Status New      OT LONG TERM GOAL #4   Title Patient will be able to type with both hands within reasonable period of time in preparation for work activities.    Time 12    Period Weeks    Status New      OT LONG TERM GOAL #5   Title Pt will perform basic home management and cooking modified independently    Time 12    Period Weeks    Status New      OT LONG TERM GOAL #6   Title Pt will improve ability to fasten buttons as shown by being able to fasten/unfasten 3 buttons in less than 60 secs or less    Time 12    Period Weeks    Status New                   Plan - 11/05/20 1653     Clinical Impression Statement Pt with success with weighted bullet pen trialed today in therapy. May benefit from information re: purchase.    OT Occupational Profile and History Problem Focused Assessment - Including review of records relating to presenting problem    Occupational performance deficits (Please refer to evaluation for details): ADL's;IADL's;Work;Leisure;Play;Rest and Sleep;Social Participation  Rehab Potential Good    Clinical Decision Making Several treatment options, min-mod task modification necessary    Comorbidities Affecting Occupational Performance: May have comorbidities impacting occupational performance    Modification or Assistance to Complete Evaluation  Min-Moderate modification of tasks or assist with assess necessary to complete eval    OT Frequency 2x / week   plus eval, anticipate d/c after 8 weeks  dependent on progress.   OT Duration 12 weeks    OT Treatment/Interventions Self-care/ADL training;Ultrasound;Energy conservation;Patient/family education;DME and/or AE instruction;Paraffin;Passive range of motion;Balance training;Cryotherapy;Fluidtherapy    Plan continue with coordination, typing, and ADL strategies    Consulted and Agree with Plan of Care Patient             Patient will benefit from skilled therapeutic intervention in order to improve the following deficits and impairments:           Visit Diagnosis: Other abnormalities of gait and mobility  Other lack of coordination  Muscle weakness (generalized)  Unsteadiness on feet  Other symptoms and signs involving the musculoskeletal system  Other disturbances of skin sensation    Problem List Patient Active Problem List   Diagnosis Date Noted   Abscess of right foot 08/16/2020   Cellulitis of right foot 08/16/2020   Microhematuria 08/03/2020   Gastroesophageal reflux disease 07/07/2019   External otitis of right ear 03/09/2017   Thumb laceration, right, initial encounter 03/09/2017   Weakness of left side of body 11/19/2016   Stress due to illness of family member 06/10/2016   Syncope 04/23/2016   Onychomycosis 04/23/2016   Impacted ear wax 04/23/2016   Constipation due to opioid therapy 06/13/2014   Chronic pain syndrome 02/21/2014   Allergic reaction 02/20/2014   Neurofibromatosis, type 1 (HCC) 12/14/2013   Status post cervical arthrodesis 11/21/2013   Cervical myelopathy (HCC) 09/21/2013   Preventative health care 06/23/2010   HORNER'S SYNDROME 04/12/2009   Other psoriasis 08/24/2007   Tinea pedis 08/24/2007    Junious Dresser, OT/L 11/05/2020, 4:54 PM  [facility] [address] [place], [place], [address] Phone: [phone]   Fax:  [phone]  Name: Naim Murtha MRN: [id number] Date of Birth: 1980/10/28

## 2020-11-07 ENCOUNTER — Ambulatory Visit: Payer: Managed Care, Other (non HMO) | Admitting: Occupational Therapy

## 2020-11-07 ENCOUNTER — Ambulatory Visit: Payer: Managed Care, Other (non HMO)

## 2020-11-07 ENCOUNTER — Other Ambulatory Visit: Payer: Self-pay

## 2020-11-07 ENCOUNTER — Encounter: Payer: Self-pay | Admitting: Occupational Therapy

## 2020-11-07 DIAGNOSIS — R2681 Unsteadiness on feet: Secondary | ICD-10-CM

## 2020-11-07 DIAGNOSIS — R208 Other disturbances of skin sensation: Secondary | ICD-10-CM

## 2020-11-07 DIAGNOSIS — M6281 Muscle weakness (generalized): Secondary | ICD-10-CM

## 2020-11-07 DIAGNOSIS — R29898 Other symptoms and signs involving the musculoskeletal system: Secondary | ICD-10-CM | POA: Diagnosis not present

## 2020-11-07 DIAGNOSIS — R293 Abnormal posture: Secondary | ICD-10-CM

## 2020-11-07 DIAGNOSIS — R278 Other lack of coordination: Secondary | ICD-10-CM

## 2020-11-07 DIAGNOSIS — R2689 Other abnormalities of gait and mobility: Secondary | ICD-10-CM

## 2020-11-07 NOTE — Patient Instructions (Signed)
Dr. Raynald Kemp,  We are working with Larry Riley in OT and PT at our site. He can benefit from a graduated return to work schedule due to decreased endurance, coordination, muscle weakness and pain. I would recommend that he returns initially at 4 hours and he has the ability to take frequent rest breaks in order to change positions. If work is going well, he can gradually increase the time to 6 hours then an 8 hour work day.  Sincerely, Keene Breath, OTR/L

## 2020-11-07 NOTE — Therapy (Signed)
[facility] [address] [place], [place], [address] Phone: [phone]   Fax:  [phone]  Physical Therapy Treatment  Patient Details  Name: Larry Riley MRN: [id number] Date of Birth: 1980/03/11 Referring Provider (PT): Dr. Mindi Junker   Encounter Date: 11/07/2020   PT End of Session - 11/07/20 1531     Visit Number 6    Number of Visits 17    Date for PT Re-Evaluation 12/03/20    Authorization Type [employer] (60v max combined)    Authorization Time Period 9/27-11/22    Progress Note Due on Visit 10    PT Start Time 1530    PT Stop Time 1610    PT Time Calculation (min) 40 min    Equipment Utilized During Treatment Gait belt    Activity Tolerance Patient tolerated treatment well    Behavior During Therapy [facility] for tasks assessed/performed             Past Medical History:  Diagnosis Date   GERD (gastroesophageal reflux disease) 01/21/2017   MRSA (methicillin resistant Staphylococcus aureus) 2005   leg   Neurofibromatosis    tumor down spine and brain    Past Surgical History:  Procedure Laterality Date   hydrocephalus     [place]unt 1999[place]   NECK SURGERY      tumor removal   SPINE SURGERY  10/2013   c2-t2 fusion  , laminectomy c1-6-- Dr Raynald Kemp-- [place]   VASECTOMY  02/2013    There were no vitals filed for this visit.   Subjective Assessment - 11/07/20 1530     Subjective No balance issues or falls reported, ADLs are becoming less difficult.    Pertinent History C2-T2 cervical fusion, neurofibromatosis, L foot drop    Patient Stated Goals Walk at least 1 mile, to get in and out of chair easier without using arms too much,    Pain Onset More than a month ago    Pain Onset More than a month ago                               [facility] Adult PT Treatment/Exercise - 11/07/20 0001       Transfers   Transfers Sit to Stand;Stand to Sit;Stand Pivot Transfers    Sit to Stand 5:  Supervision    Stand to Sit 5: Supervision    Stand Pivot Transfers 5: Supervision    Comments 10x from airex holding 6# ball      Ambulation/Gait   Ambulation/Gait Yes    Ambulation/Gait Assistance 5: Supervision    Ambulation Distance (Feet) 300 Feet    Assistive device None    Gait Pattern Step-through pattern;Decreased hip/knee flexion - right    Ambulation Surface Level;Indoor    Gait Comments icorporated random direction changes, speed changes and pivot turns      Lumbar Exercises: Seated   Other Seated Lumbar Exercises seated core of hip/shoulder tosses, Vs and chops with 5.5# ball, 12x ea.      Knee/Hip Exercises: Aerobic   Stepper scifit LEs only, L4.5, 8 min, no arms seat 15                 Balance Exercises - 11/07/20 0001       Balance Exercises: Standing   SLS with Vectors Solid surface;Limitations    SLS with Vectors Limitations 4" step taps fwd and lateral 10x ea with WS  Other Standing Exercises tall kneel sidestep 3x length of mat    Other Standing Exercises Comments tapping floor pebbles from airex with single UE support, 2 trgets unilateral, alternating, cross body, alt cross body and double tap all for 10 reps per LE                  PT Short Term Goals - 10/08/20 1552       PT SHORT TERM GOAL #1   Title Patient will be able to ambulate 1050' without AD on level ground with SBA to improve short community ambulation    Baseline has not walked long since surgery    Time 4    Period Weeks    Status New    Target Date 11/05/20      PT SHORT TERM GOAL #2   Title Pt will demo 5x sit to stand score <20 seconds without HHA to improve functional strength in bil LE    Baseline 27 seconds (10/08/20)    Time 4    Period Weeks    Status New    Target Date 11/05/20      PT SHORT TERM GOAL #3   Title Patient will be able to ambulate 400 feet on grass without AD and SBA to improve safe community negotiation    Baseline not attempted after  surgery (10/08/20)    Time 4    Period Weeks    Status New    Target Date 11/05/20               PT Long Term Goals - 10/08/20 1554       PT LONG TERM GOAL #1   Title Pt will demo <12 seconds with 5x sit to stand without HHA to improve functional strength in bil LE    Baseline 27 sec (10/08/20)    Time 8    Period Weeks    Status New    Target Date 12/03/20      PT LONG TERM GOAL #2   Title Pt will be able to ambulate 25 min without AD with walking program at home to improve walking endurance to access community    Baseline not attempted, walking program issued (10/08/20)    Time 8    Period Weeks    Status New    Target Date 12/03/20      PT LONG TERM GOAL #3   Title Patient will demo >20/30 on functional gait assessment to improve functional balance and gait    Baseline 9/30 (10/08/20)    Time 8    Period Weeks    Status New    Target Date 12/03/20                   Plan - 11/07/20 1535     Clinical Impression Statement Focus this session was LE strength and conditioning, balance and proprioceptive tasks with a focus on foot placement.  Continued activities on compliant surfaces to challenge balance and proprioception.  Increased reps on core tasks and added resistance to Scifit to increase workload.    Personal Factors and Comorbidities Comorbidity 3+    Comorbidities Neurofibromatosis, hx of cervical fusions, chronic neck pain, back pain, carpal tunnel syndrome, L foot drop    Examination-Activity Limitations Bend;Carry;Dressing;Lift;Reach Overhead;Sit;Sleep;Squat;Stairs;Stand;Transfers;Bathing    Examination-Participation Restrictions Cleaning;Community Activity;Driving;Laundry;Meal Prep;Occupation;Shop;Yard Work    Conservation officer, historic buildings Evolving/Moderate complexity    Rehab Potential Good    PT Frequency 2x / week    PT  Duration 8 weeks    PT Treatment/Interventions ADLs/Self Care Home Management;Cryotherapy;Electrical Stimulation;Moist  Heat;Traction;Gait training;Stair training;Functional mobility training;Therapeutic activities;Therapeutic exercise;Balance training;Neuromuscular re-education;Patient/family education;Orthotic Fit/Training;Manual techniques;Scar mobilization;Passive range of motion;Dry needling;Energy conservation;Spinal Manipulations;Joint Manipulations    PT Next Visit Plan standing balance, stepping, gait training, core tasks, tall kneeling and L hip stabilization tasks as tolerated, check STGs    PT Home Exercise Plan Walking program, [id number]    Consulted and Agree with Plan of Care Patient             Patient will benefit from skilled therapeutic intervention in order to improve the following deficits and impairments:  Abnormal gait, Decreased activity tolerance, Decreased balance, Decreased range of motion, Decreased mobility, Decreased endurance, Decreased skin integrity, Decreased strength, Difficulty walking, Increased fascial restricitons, Increased muscle spasms, Impaired flexibility, Impaired sensation, Impaired tone, Impaired UE functional use, Improper body mechanics, Postural dysfunction, Pain  Visit Diagnosis: No diagnosis found.     Problem List Patient Active Problem List   Diagnosis Date Noted   Abscess of right foot 08/16/2020   Cellulitis of right foot 08/16/2020   Microhematuria 08/03/2020   Gastroesophageal reflux disease 07/07/2019   External otitis of right ear 03/09/2017   Thumb laceration, right, initial encounter 03/09/2017   Weakness of left side of body 11/19/2016   Stress due to illness of family member 06/10/2016   Syncope 04/23/2016   Onychomycosis 04/23/2016   Impacted ear wax 04/23/2016   Constipation due to opioid therapy 06/13/2014   Chronic pain syndrome 02/21/2014   Allergic reaction 02/20/2014   Neurofibromatosis, type 1 (HCC) 12/14/2013   Status post cervical arthrodesis 11/21/2013   Cervical myelopathy (HCC) 09/21/2013   Preventative health care  06/23/2010   HORNER'S SYNDROME 04/12/2009   Other psoriasis 08/24/2007   Tinea pedis 08/24/2007    Hildred Laser, PT 11/07/2020, 4:12 PM  [facility] [address] [place], [place], [address] Phone: [phone]   Fax:  [phone]  Name: Larry Riley MRN: [id number] Date of Birth: 1980/12/08

## 2020-11-08 NOTE — Therapy (Signed)
[facility] [address] [place], [place], [address] Phone: [phone]   Fax:  [phone]  Occupational Therapy Treatment  Patient Details  Name: Larry Riley MRN: [id number] Date of Birth: [birth year] Referring Provider (OT): Dr. Raynald Kemp   Encounter Date: 11/07/2020   OT End of Session - 11/07/20 1452     Visit Number 7    Number of Visits 25    Date for OT Re-Evaluation 01/01/21    Authorization - Visit Number 7    Authorization - Number of Visits 30    OT Start Time 1449    OT Stop Time 1530    OT Time Calculation (min) 41 min             Past Medical History:  Diagnosis Date   GERD (gastroesophageal reflux disease) 01/21/2017   MRSA (methicillin resistant Staphylococcus aureus) 2005   leg   Neurofibromatosis    tumor down spine and brain    Past Surgical History:  Procedure Laterality Date   hydrocephalus     [place]unt 1999[place]   NECK SURGERY      tumor removal   SPINE SURGERY  10/2013   c2-t2 fusion  , laminectomy c1-6-- Dr Raynald Kemp-- [place]   VASECTOMY  02/2013    There were no vitals filed for this visit.   Subjective Assessment - 11/08/20 0825     Subjective  Pt reports he got the date for his f/u visit mixed up and he does not see surgeon until next week    Pertinent History Larry Riley is a 40 y.o. 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 Raynald Kemp. PMH: Neurofibromatosis, type 1,   debulking of cervical spine tumor/Cervical athrodesis, Horners Syndrome, and hydrocephalus, numerous cervical surgeries over the years (6-7    Limitations No bending, lifting, twisting , pushing or pulling over 10 lbs, limit overhead activity,  no driving til cleared by MD no strenuous exercise for 4 weeks   lifting restriction for 4 week    Currently in Pain? Yes    Pain Score 5     Pain Location Shoulder    Pain Orientation Right;Left    Pain Descriptors / Indicators  Aching    Pain Type Chronic pain    Pain Onset More than a month ago    Pain Frequency Constant    Aggravating Factors  activity    Pain Relieving Factors rest               Treatment: Writing sentences with increased time using weighted pen, pt demonstrates improved legibility, pt was shown info for purchase. Cutting food with knife and fork, then with foam grip min-mod difficulty, Pt trialed rocker knife and he demonstrated improved ease and performance with cutting at a mod I level.  Pt was provided with information for purchase. Gripper set at level 1 for sustained grip to pick up 1 inch blocks several rest breaks required, min difficulty/ drops due to weakness and fatigue.                   OT Education - 11/08/20 [phone]     Education Details recommendation that pt returns to work at a graduated wiork schedule with the ability to take frequent rest breaks - note provided with pt to share with MD   Person(s) Educated Patient    Methods Explanation;Demonstration;Verbal cues    Comprehension Verbalized understanding;Returned demonstration;Verbal cues required  OT Short Term Goals - 11/07/20 1504       OT SHORT TERM GOAL #1   Title Pt will be I with HEP -11/20/20    Time 6    Period Weeks    Status On-going    Target Date 11/20/20      OT SHORT TERM GOAL #2   Title Pt will demonstrate improved coordination for ADLs as evidenced by placing 9 pegs in 2 mins  or less for RUE    Baseline 6 pegs placed in 2 mins    Time 6    Period Weeks    Status On-going      OT SHORT TERM GOAL #3   Title Pt will perform bathing and dressing with min A    Time 6    Period Weeks    Status On-going      OT SHORT TERM GOAL #4   Title Pt will demonstrate understaning of adapted strategies/ AE to assist with ADLS (such as feeding, cutting food, using computer mouse.)    Time 6    Period Weeks    Status On-going      OT SHORT TERM GOAL #5   Title Pt will  demonstrate improved coordination for ADLs as evidenced by decreasing 9 hole peg test to 50 secs or less for LUE    Baseline LUE 55.72 secs    Time 6    Period Weeks    Status On-going      OT SHORT TERM GOAL #6   Title Pt will demonstrate improved functional use of RUE as evidenced by increasing box/ blocks score to 37 blocks or greater.    Time 6    Period Weeks    Status On-going               OT Long Term Goals - 10/31/20 1322       OT LONG TERM GOAL #1   Title Pt will be I with upgraded HEP prn-01/02/20    Time 12    Period Weeks    Status New      OT LONG TERM GOAL #2   Title Pt will demonstrate improved coordination for ADLs as evidenced by completing 9 hole peg test in 90 secs or less with RUE.    Baseline 6 pegs placed in 2 mins    Time 12    Period Weeks    Status New      OT LONG TERM GOAL #3   Title Pt will perfrorm all basic ADLs modified indpendently    Time 12    Period Weeks    Status New      OT LONG TERM GOAL #4   Title Patient will be able to type with both hands within reasonable period of time in preparation for work activities.    Time 12    Period Weeks    Status New      OT LONG TERM GOAL #5   Title Pt will perform basic home management and cooking modified independently    Time 12    Period Weeks    Status New      OT LONG TERM GOAL #6   Title Pt will improve ability to fasten buttons as shown by being able to fasten/unfasten 3 buttons in less than 60 secs or less    Time 12    Period Weeks    Status New  Plan - 11/07/20 1458     Clinical Impression Statement Pt reports that he accidentally missed his appointment with MD    OT Occupational Profile and History Problem Focused Assessment - Including review of records relating to presenting problem    Occupational performance deficits (Please refer to evaluation for details): ADL's;IADL's;Work;Leisure;Play;Rest and Sleep;Social Participation    Body  Structure / Function / Physical Skills ADL;Flexibility;ROM;UE functional use;Decreased knowledge of use of DME;FMC;Balance;Dexterity;Gait;Sensation;GMC;Endurance;IADL;Coordination;Strength;Pain    Rehab Potential Good    Clinical Decision Making Several treatment options, min-mod task modification necessary    Comorbidities Affecting Occupational Performance: May have comorbidities impacting occupational performance    Modification or Assistance to Complete Evaluation  Min-Moderate modification of tasks or assist with assess necessary to complete eval    OT Frequency 2x / week   plus eval, anticipate d/c after 8 weeks dependent on progress.   OT Duration 12 weeks    OT Treatment/Interventions Self-care/ADL training;Ultrasound;Energy conservation;Patient/family education;DME and/or AE instruction;Paraffin;Passive range of motion;Balance training;Cryotherapy;Fluidtherapy    Plan continue with coordination, typing, and ADL strategies    Consulted and Agree with Plan of Care Patient             Patient will benefit from skilled therapeutic intervention in order to improve the following deficits and impairments:   Body Structure / Function / Physical Skills: ADL, Flexibility, ROM, UE functional use, Decreased knowledge of use of DME, FMC, Balance, Dexterity, Gait, Sensation, GMC, Endurance, IADL, Coordination, Strength, Pain       Visit Diagnosis: Other lack of coordination  Muscle weakness (generalized)  Unsteadiness on feet  Other symptoms and signs involving the musculoskeletal system  Other disturbances of skin sensation  Abnormal posture    Problem List Patient Active Problem List   Diagnosis Date Noted   Abscess of right foot 08/16/2020   Cellulitis of right foot 08/16/2020   Microhematuria 08/03/2020   Gastroesophageal reflux disease 07/07/2019   External otitis of right ear 03/09/2017   Thumb laceration, right, initial encounter 03/09/2017   Weakness of left side of  body 11/19/2016   Stress due to illness of family member 06/10/2016   Syncope 04/23/2016   Onychomycosis 04/23/2016   Impacted ear wax 04/23/2016   Constipation due to opioid therapy 06/13/2014   Chronic pain syndrome 02/21/2014   Allergic reaction 02/20/2014   Neurofibromatosis, type 1 (HCC) 12/14/2013   Status post cervical arthrodesis 11/21/2013   Cervical myelopathy (HCC) 09/21/2013   Preventative health care 06/23/2010   HORNER'S SYNDROME 04/12/2009   Other psoriasis 08/24/2007   Tinea pedis 08/24/2007    Larry Riley, OT/L 11/08/2020, 8:38 AM  [facility] [address] [place], [place], [address] Phone: [phone]   Fax:  [phone]  Name: Larry Riley MRN: [id number] Date of Birth: 05-09-1980

## 2020-11-12 ENCOUNTER — Encounter: Payer: Self-pay | Admitting: Occupational Therapy

## 2020-11-12 ENCOUNTER — Other Ambulatory Visit: Payer: Self-pay

## 2020-11-12 ENCOUNTER — Ambulatory Visit: Payer: Managed Care, Other (non HMO) | Attending: Neurosurgery | Admitting: Occupational Therapy

## 2020-11-12 ENCOUNTER — Ambulatory Visit: Payer: Managed Care, Other (non HMO) | Admitting: Rehabilitation

## 2020-11-12 DIAGNOSIS — R208 Other disturbances of skin sensation: Secondary | ICD-10-CM | POA: Insufficient documentation

## 2020-11-12 DIAGNOSIS — M542 Cervicalgia: Secondary | ICD-10-CM | POA: Diagnosis present

## 2020-11-12 DIAGNOSIS — R2689 Other abnormalities of gait and mobility: Secondary | ICD-10-CM | POA: Insufficient documentation

## 2020-11-12 DIAGNOSIS — R29898 Other symptoms and signs involving the musculoskeletal system: Secondary | ICD-10-CM | POA: Insufficient documentation

## 2020-11-12 DIAGNOSIS — R2681 Unsteadiness on feet: Secondary | ICD-10-CM | POA: Insufficient documentation

## 2020-11-12 DIAGNOSIS — R293 Abnormal posture: Secondary | ICD-10-CM | POA: Diagnosis present

## 2020-11-12 DIAGNOSIS — M6281 Muscle weakness (generalized): Secondary | ICD-10-CM | POA: Diagnosis present

## 2020-11-12 DIAGNOSIS — R278 Other lack of coordination: Secondary | ICD-10-CM | POA: Diagnosis present

## 2020-11-12 NOTE — Therapy (Signed)
[facility] [address] [place], [place], [address] Phone: [phone]   Fax:  [phone]  Occupational Therapy Treatment  Patient Details  Name: Larry Riley MRN: [id number] Date of Birth: [birth year] Referring Provider (OT): Dr. Raynald Kemp   Encounter Date: 11/12/2020   OT End of Session - 11/12/20 1115     Visit Number 8    Number of Visits 25    Date for OT Re-Evaluation 01/01/21    Authorization Type cigna    Authorization - Visit Number 8    Authorization - Number of Visits 30    OT Start Time [phone]    OT Stop Time 0800    OT Time Calculation (min) 38 min    Activity Tolerance Patient tolerated treatment well    Behavior During Therapy [facility] for tasks assessed/performed             Past Medical History:  Diagnosis Date   GERD (gastroesophageal reflux disease) 01/21/2017   MRSA (methicillin resistant Staphylococcus aureus) 2005   leg   Neurofibromatosis    tumor down spine and brain    Past Surgical History:  Procedure Laterality Date   hydrocephalus     [place]unt 1999[place]   NECK SURGERY      tumor removal   SPINE SURGERY  10/2013   c2-t2 fusion  , laminectomy c1-6-- Dr Raynald Kemp-- [place]   VASECTOMY  02/2013    There were no vitals filed for this visit.   Subjective Assessment - 11/12/20 1114     Pertinent History Larry Riley is a 40 y.o. 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 Raynald Kemp. PMH: Neurofibromatosis, type 1,   debulking of cervical spine tumor/Cervical athrodesis, Horners Syndrome, and hydrocephalus, numerous cervical surgeries over the years (6-7    Limitations no restrictions per MD now, progress at pt's own comfort level                       Treatment: copying small peg design with RUE, mod difficulty and v.c to watch his hand the entire time he performs task due to sensory deficits, increased time required. Pt reports difficulty  using his mouse. Pt may go to the office supply warehouse and trial a roller mouse.           OT Education - 11/12/20 1120     Education Details Yellow theraband exercises 10 reps each, min v.c for positioning, see pt instructions    Person(s) Educated Patient    Methods Explanation;Demonstration;Verbal cues;Handout    Comprehension Verbalized understanding;Returned demonstration;Verbal cues required              OT Short Term Goals - 11/12/20 1118       OT SHORT TERM GOAL #1   Title Pt will be I with HEP -11/20/20    Time 6    Period Weeks    Status On-going    Target Date 11/20/20      OT SHORT TERM GOAL #2   Title Pt will demonstrate improved coordination for ADLs as evidenced by placing 9 pegs in 2 mins  or less for RUE    Baseline 6 pegs placed in 2 mins    Time 6    Period Weeks    Status On-going      OT SHORT TERM GOAL #3   Title Pt will perform bathing and dressing with min A    Time  6    Period Weeks    Status On-going      OT SHORT TERM GOAL #4   Title Pt will demonstrate understaning of adapted strategies/ AE to assist with ADLS (such as feeding, cutting food, using computer mouse.)    Time 6    Period Weeks    Status On-going      OT SHORT TERM GOAL #5   Title Pt will demonstrate improved coordination for ADLs as evidenced by decreasing 9 hole peg test to 50 secs or less for LUE    Baseline LUE 55.72 secs    Time 6    Period Weeks    Status On-going      OT SHORT TERM GOAL #6   Title Pt will demonstrate improved functional use of RUE as evidenced by increasing box/ blocks score to 37 blocks or greater.    Time 6    Period Weeks    Status On-going               OT Long Term Goals - 10/31/20 1322       OT LONG TERM GOAL #1   Title Pt will be I with upgraded HEP prn-01/02/20    Time 12    Period Weeks    Status New      OT LONG TERM GOAL #2   Title Pt will demonstrate improved coordination for ADLs as evidenced by completing 9  hole peg test in 90 secs or less with RUE.    Baseline 6 pegs placed in 2 mins    Time 12    Period Weeks    Status New      OT LONG TERM GOAL #3   Title Pt will perfrorm all basic ADLs modified indpendently    Time 12    Period Weeks    Status New      OT LONG TERM GOAL #4   Title Patient will be able to type with both hands within reasonable period of time in preparation for work activities.    Time 12    Period Weeks    Status New      OT LONG TERM GOAL #5   Title Pt will perform basic home management and cooking modified independently    Time 12    Period Weeks    Status New      OT LONG TERM GOAL #6   Title Pt will improve ability to fasten buttons as shown by being able to fasten/unfasten 3 buttons in less than 60 secs or less    Time 12    Period Weeks    Status New                   Plan - 11/12/20 0756     Clinical Impression Statement Pt is progressing towards goals with improving strength. He reports MD cleared him with no restrictions.    OT Occupational Profile and History Problem Focused Assessment - Including review of records relating to presenting problem    Occupational performance deficits (Please refer to evaluation for details): ADL's;IADL's;Work;Leisure;Play;Rest and Sleep;Social Participation    Body Structure / Function / Physical Skills ADL;Flexibility;ROM;UE functional use;Decreased knowledge of use of DME;FMC;Balance;Dexterity;Gait;Sensation;GMC;Endurance;IADL;Coordination;Strength;Pain    Rehab Potential Good    Clinical Decision Making Several treatment options, min-mod task modification necessary    Comorbidities Affecting Occupational Performance: May have comorbidities impacting occupational performance    Modification or Assistance to Complete Evaluation  Min-Moderate modification of tasks  or assist with assess necessary to complete eval    OT Frequency 2x / week   plus eval, anticipate d/c after 8 weeks dependent on progress.   OT  Duration 12 weeks    OT Treatment/Interventions Self-care/ADL training;Ultrasound;Energy conservation;Patient/family education;DME and/or AE instruction;Paraffin;Passive range of motion;Balance training;Cryotherapy;Fluidtherapy    Plan reveiw yellow theraband exercises, continue to address, coordination, typing    Consulted and Agree with Plan of Care Patient             Patient will benefit from skilled therapeutic intervention in order to improve the following deficits and impairments:   Body Structure / Function / Physical Skills: ADL, Flexibility, ROM, UE functional use, Decreased knowledge of use of DME, FMC, Balance, Dexterity, Gait, Sensation, GMC, Endurance, IADL, Coordination, Strength, Pain       Visit Diagnosis: Muscle weakness (generalized)  Unsteadiness on feet  Other lack of coordination  Other symptoms and signs involving the musculoskeletal system  Other disturbances of skin sensation  Abnormal posture    Problem List Patient Active Problem List   Diagnosis Date Noted   Abscess of right foot 08/16/2020   Cellulitis of right foot 08/16/2020   Microhematuria 08/03/2020   Gastroesophageal reflux disease 07/07/2019   External otitis of right ear 03/09/2017   Thumb laceration, right, initial encounter 03/09/2017   Weakness of left side of body 11/19/2016   Stress due to illness of family member 06/10/2016   Syncope 04/23/2016   Onychomycosis 04/23/2016   Impacted ear wax 04/23/2016   Constipation due to opioid therapy 06/13/2014   Chronic pain syndrome 02/21/2014   Allergic reaction 02/20/2014   Neurofibromatosis, type 1 (HCC) 12/14/2013   Status post cervical arthrodesis 11/21/2013   Cervical myelopathy (HCC) 09/21/2013   Preventative health care 06/23/2010   HORNER'S SYNDROME 04/12/2009   Other psoriasis 08/24/2007   Tinea pedis 08/24/2007    Larry Riley, OT/L 11/12/2020, 11:21 AM  [facility] [address] [place], [place], [address] Phone: [phone]   Fax:  [phone]  Name: Larry Riley MRN: [id number] Date of Birth: Dec 31, 1980

## 2020-11-12 NOTE — Patient Instructions (Signed)
  Strengthening: Resisted Flexion   Hold tubing with __each___ arm(s) at side. Pull forward and up. Move shoulder through pain-free range of motion. Repeat __10__ times per set.  Do _1-2_ sessions per day , every other day   Strengthening: Resisted Extension   Hold tubing in ___each__ hand(s), arm forward. Pull arm back, elbow straight. Repeat _10___ times per set. Do _1-2___ sessions per day, every other day.   Resisted Horizontal Abduction: Bilateral   Sit or stand, tubing in both hands, arms out in front. Keeping arms straight, pinch shoulder blades together and stretch arms out. Repeat _10___ times per set. Do _1-2___ sessions per day, every other day.   Elbow Flexion: Resisted   With tubing held in __each____ hand(s) and other end secured under foot, curl arm up as far as possible. Repeat _10___ times per set. Do _1-2___ sessions per day, every other day.    Elbow Extension: Resisted   Sit in chair with resistive band secured at armrest (or hold with other hand) and __one _____ elbow bent. Straighten elbow. Repeat _10___ times per set.  Do _1-2___ sessions per day, every other day.   Copyright  VHI. All rights reserved.

## 2020-11-13 ENCOUNTER — Encounter: Payer: Self-pay | Admitting: Internal Medicine

## 2020-11-13 ENCOUNTER — Encounter: Payer: Self-pay | Admitting: Podiatry

## 2020-11-14 ENCOUNTER — Other Ambulatory Visit: Payer: Self-pay | Admitting: Podiatry

## 2020-11-14 MED ORDER — TERBINAFINE HCL 250 MG PO TABS: ORAL_TABLET | ORAL | 0 refills | Status: DC

## 2020-11-14 MED ORDER — SILDENAFIL CITRATE 20 MG PO TABS
ORAL_TABLET | ORAL | 5 refills | Status: DC
Start: 2020-11-14 — End: 2022-03-06

## 2020-11-14 NOTE — Telephone Encounter (Signed)
done

## 2020-11-15 ENCOUNTER — Ambulatory Visit: Payer: Managed Care, Other (non HMO)

## 2020-11-15 ENCOUNTER — Ambulatory Visit: Payer: Managed Care, Other (non HMO) | Admitting: Occupational Therapy

## 2020-11-15 ENCOUNTER — Encounter: Payer: Managed Care, Other (non HMO) | Admitting: Occupational Therapy

## 2020-11-15 ENCOUNTER — Other Ambulatory Visit: Payer: Self-pay

## 2020-11-15 DIAGNOSIS — R2681 Unsteadiness on feet: Secondary | ICD-10-CM

## 2020-11-15 DIAGNOSIS — R208 Other disturbances of skin sensation: Secondary | ICD-10-CM

## 2020-11-15 DIAGNOSIS — M6281 Muscle weakness (generalized): Secondary | ICD-10-CM | POA: Diagnosis not present

## 2020-11-15 DIAGNOSIS — R29898 Other symptoms and signs involving the musculoskeletal system: Secondary | ICD-10-CM

## 2020-11-15 DIAGNOSIS — R278 Other lack of coordination: Secondary | ICD-10-CM

## 2020-11-15 DIAGNOSIS — R2689 Other abnormalities of gait and mobility: Secondary | ICD-10-CM

## 2020-11-15 NOTE — Patient Instructions (Signed)
Access Code: [email or web address] URL: [email or web address] Date: 11/15/2020 Prepared by: Gustavus Bryant  Exercises Supine Bridge - 2 x daily - 7 x weekly - 1 sets - 30 reps Sit to Stand Without Arm Support - 2 x daily - 7 x weekly - 1 sets - 10 reps Tandem Stance with Support - 2 x daily - 7 x weekly - 1 sets - 2 reps - 30s hold Clamshell - 2 x daily - 7 x weekly - 1 sets - 30 reps Sidelying Hip Abduction - 2 x daily - 7 x weekly - 1 sets - 30 reps

## 2020-11-15 NOTE — Therapy (Signed)
[facility] [address] [place], [place], [address] Phone: [phone]   Fax:  [phone]  Occupational Therapy Treatment  Patient Details  Name: Larry Riley MRN: [id number] Date of Birth: 1980/11/26 Referring Provider (OT): Dr. Raynald Kemp   Encounter Date: 11/15/2020   OT End of Session - 11/15/20 1102     Visit Number 9    Number of Visits 25    Date for OT Re-Evaluation 01/01/21    Authorization Type cigna    Authorization - Visit Number 9    Authorization - Number of Visits 30    OT Start Time 1102    OT Stop Time 1143    OT Time Calculation (min) 41 min    Activity Tolerance Patient tolerated treatment well    Behavior During Therapy [facility] for tasks assessed/performed             Past Medical History:  Diagnosis Date   GERD (gastroesophageal reflux disease) 01/21/2017   MRSA (methicillin resistant Staphylococcus aureus) 2005   leg   Neurofibromatosis    tumor down spine and brain    Past Surgical History:  Procedure Laterality Date   hydrocephalus     [place]unt 1999[place]   NECK SURGERY      tumor removal   SPINE SURGERY  10/2013   c2-t2 fusion  , laminectomy c1-6-- Dr Raynald Kemp-- [place]   VASECTOMY  02/2013    There were no vitals filed for this visit.   Subjective Assessment - 11/15/20 1101     Subjective  Its the end of my first week back to work. "It's been a little tiring but pretty good"    Pertinent History Larry Riley is a 40 y.o. 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 Raynald Kemp. PMH: Neurofibromatosis, type 1,   debulking of cervical spine tumor/Cervical athrodesis, Horners Syndrome, and hydrocephalus, numerous cervical surgeries over the years (6-7    Limitations no restrictions per MD now, progress at pt's own comfort level    Pain Onset More than a month ago                          OT Treatments/Exercises (OP) - 11/15/20 0001        ADLs   UB Dressing still reports increased difficulty with buttons but overall doing more with dressing    Bathing pt reports this is getting "pretty much back to normal"    Work pt reports going to ask for accommodations and adapted equipment for mouse and keyboard - pt reports being back with breaks here and there but some fatigue and overall tiredness with being back. Mouse Accuracy with target practice trialing with right and left hand. Pt did better with left but still with only about 11 target hits out of 17 clicks. P also trialed laptop mouse pad with more difficulty.      Exercises   Exercises Hand      Hand Exercises   Hand Gripper with Medium Beads picking up 1 inch blocks iwth RUE with level 1 with black spring with min difficulty and drops but increased endurance from previous time, completed with LUE with level 2 with black spring with min difficulty/drops. Increased fatigue on L side.     Fine Motor Coordination (Hand/Wrist)   Fine Motor Coordination Grooved pegs    Grooved pegs with RUE with emphasis and verbal cues for watching hand for  sensation deficits. Pt completed with mod difficulty, increased time and moderate drops.            pt reports not trying the yellow theraband exercises at home - "totally forgot about it" - review next session.          OT Short Term Goals - 11/12/20 1118       OT SHORT TERM GOAL #1   Title Pt will be I with HEP -11/20/20    Time 6    Period Weeks    Status On-going    Target Date 11/20/20      OT SHORT TERM GOAL #2   Title Pt will demonstrate improved coordination for ADLs as evidenced by placing 9 pegs in 2 mins  or less for RUE    Baseline 6 pegs placed in 2 mins    Time 6    Period Weeks    Status On-going      OT SHORT TERM GOAL #3   Title Pt will perform bathing and dressing with min A    Time 6    Period Weeks    Status On-going      OT SHORT TERM GOAL #4   Title Pt will demonstrate understaning of adapted  strategies/ AE to assist with ADLS (such as feeding, cutting food, using computer mouse.)    Time 6    Period Weeks    Status On-going      OT SHORT TERM GOAL #5   Title Pt will demonstrate improved coordination for ADLs as evidenced by decreasing 9 hole peg test to 50 secs or less for LUE    Baseline LUE 55.72 secs    Time 6    Period Weeks    Status On-going      OT SHORT TERM GOAL #6   Title Pt will demonstrate improved functional use of RUE as evidenced by increasing box/ blocks score to 37 blocks or greater.    Time 6    Period Weeks    Status On-going               OT Long Term Goals - 10/31/20 1322       OT LONG TERM GOAL #1   Title Pt will be I with upgraded HEP prn-01/02/20    Time 12    Period Weeks    Status New      OT LONG TERM GOAL #2   Title Pt will demonstrate improved coordination for ADLs as evidenced by completing 9 hole peg test in 90 secs or less with RUE.    Baseline 6 pegs placed in 2 mins    Time 12    Period Weeks    Status New      OT LONG TERM GOAL #3   Title Pt will perfrorm all basic ADLs modified indpendently    Time 12    Period Weeks    Status New      OT LONG TERM GOAL #4   Title Patient will be able to type with both hands within reasonable period of time in preparation for work activities.    Time 12    Period Weeks    Status New      OT LONG TERM GOAL #5   Title Pt will perform basic home management and cooking modified independently    Time 12    Period Weeks    Status New      OT LONG TERM GOAL #6  Title Pt will improve ability to fasten buttons as shown by being able to fasten/unfasten 3 buttons in less than 60 secs or less    Time 12    Period Weeks    Status New                   Plan - 11/15/20 1134     Clinical Impression Statement Pt continues to progress. Reports difficulty with keyboard and mouse with going back to work. Continue working towards unmet  goals.    OT Occupational Profile and  History Problem Focused Assessment - Including review of records relating to presenting problem    Occupational performance deficits (Please refer to evaluation for details): ADL's;IADL's;Work;Leisure;Play;Rest and Sleep;Social Participation    Body Structure / Function / Physical Skills ADL;Flexibility;ROM;UE functional use;Decreased knowledge of use of DME;FMC;Balance;Dexterity;Gait;Sensation;GMC;Endurance;IADL;Coordination;Strength;Pain    Rehab Potential Good    Clinical Decision Making Several treatment options, min-mod task modification necessary    Comorbidities Affecting Occupational Performance: May have comorbidities impacting occupational performance    Modification or Assistance to Complete Evaluation  Min-Moderate modification of tasks or assist with assess necessary to complete eval    OT Frequency 2x / week   plus eval, anticipate d/c after 8 weeks dependent on progress.   OT Duration 12 weeks    OT Treatment/Interventions Self-care/ADL training;Ultrasound;Energy conservation;Patient/family education;DME and/or AE instruction;Paraffin;Passive range of motion;Balance training;Cryotherapy;Fluidtherapy    Plan review yellow theraband exercises, continue to address, coordination, typing    Consulted and Agree with Plan of Care Patient             Patient will benefit from skilled therapeutic intervention in order to improve the following deficits and impairments:   Body Structure / Function / Physical Skills: ADL, Flexibility, ROM, UE functional use, Decreased knowledge of use of DME, FMC, Balance, Dexterity, Gait, Sensation, GMC, Endurance, IADL, Coordination, Strength, Pain       Visit Diagnosis: Muscle weakness (generalized)  Unsteadiness on feet  Other lack of coordination  Other symptoms and signs involving the musculoskeletal system  Other disturbances of skin sensation  Other abnormalities of gait and mobility    Problem List Patient Active Problem List    Diagnosis Date Noted   Abscess of right foot 08/16/2020   Cellulitis of right foot 08/16/2020   Microhematuria 08/03/2020   Gastroesophageal reflux disease 07/07/2019   External otitis of right ear 03/09/2017   Thumb laceration, right, initial encounter 03/09/2017   Weakness of left side of body 11/19/2016   Stress due to illness of family member 06/10/2016   Syncope 04/23/2016   Onychomycosis 04/23/2016   Impacted ear wax 04/23/2016   Constipation due to opioid therapy 06/13/2014   Chronic pain syndrome 02/21/2014   Allergic reaction 02/20/2014   Neurofibromatosis, type 1 (HCC) 12/14/2013   Status post cervical arthrodesis 11/21/2013   Cervical myelopathy (HCC) 09/21/2013   Preventative health care 06/23/2010   HORNER'S SYNDROME 04/12/2009   Other psoriasis 08/24/2007   Tinea pedis 08/24/2007    Junious Dresser, OT/L 11/15/2020, 11:42 AM  [place] [facility] [address] [place], [place], [address] Phone: [phone]   Fax:  [phone]  Name: Bucky Grigg MRN: [id number] Date of Birth: 09-05-1980

## 2020-11-15 NOTE — Therapy (Signed)
[facility] [address] [place], [place], [address] Phone: [phone]   Fax:  [phone]  Physical Therapy Treatment  Patient Details  Name: Larry Riley MRN: [id number] Date of Birth: 08-26-1980 Referring Provider (PT): Dr. Mindi Junker   Encounter Date: 11/15/2020   PT End of Session - 11/15/20 1124     Visit Number 7    Number of Visits 17    Date for PT Re-Evaluation 12/03/20    Authorization Type [employer] (60v max combined)    Authorization Time Period 9/27-11/22    Progress Note Due on Visit 10    PT Start Time 1015    PT Stop Time 1100    PT Time Calculation (min) 45 min    Equipment Utilized During Treatment Gait belt    Activity Tolerance Patient tolerated treatment well    Behavior During Therapy [facility] for tasks assessed/performed             Past Medical History:  Diagnosis Date   GERD (gastroesophageal reflux disease) 01/21/2017   MRSA (methicillin resistant Staphylococcus aureus) 2005   leg   Neurofibromatosis    tumor down spine and brain    Past Surgical History:  Procedure Laterality Date   hydrocephalus     shunt 1999[place]   NECK SURGERY      tumor removal   SPINE SURGERY  10/2013   c2-t2 fusion  , laminectomy c1-6-- Dr Raynald Kemp-- [place]   VASECTOMY  02/2013    There were no vitals filed for this visit.   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    Pertinent History C2-T2 cervical fusion, neurofibromatosis, L foot drop    Patient Stated Goals Walk at least 1 mile, to get in and out of chair easier without using arms too much,    Pain Onset More than a month ago    Pain Onset More than a month ago                               [facility] Adult PT Treatment/Exercise - 11/15/20 0001       Transfers   Transfers Sit to Stand;Stand to Sit;Stand Pivot Transfers    Sit to Stand 5:  Supervision    Stand to Sit 5: Supervision    Stand Pivot Transfers 5: Supervision    Comments 10x from airex holding 6# ball reachinh OH when standing      Ambulation/Gait   Ambulation/Gait Yes    Ambulation/Gait Assistance 5: Supervision    Ambulation Distance (Feet) 250 Feet    Assistive device None    Gait Pattern Step-through pattern;Decreased hip/knee flexion - right    Ambulation Surface Level;Indoor    Gait Comments self toss/catch with ball      Knee/Hip Exercises: Standing   Lateral Step Up Both;1 set;10 reps;Step Height: 4"    Lateral Step Up Limitations Single UE support    Forward Step Up Both;1 set;10 reps;Step Height: 4";Hand Hold: 1    Step Down Both;1 set;10 reps;Hand Hold: 1;Step Height: 2"      Knee/Hip Exercises: Sidelying   Hip ABduction Both;Limitations    Hip ABduction Limitations 30x B    Clams 30x B                 Balance Exercises - 11/15/20 0001  Balance Exercises: Standing   SLS with Vectors Solid surface;Limitations    SLS with Vectors Limitations cone taps 2 targets unlateral, 2 targets alt    Stepping Strategy Anterior;Lateral;10 reps;Limitations    Stepping Strategy Limitations stepups onto 4' blocj 0x ea. direction each LE                  PT Short Term Goals - 11/15/20 1130       PT SHORT TERM GOAL #1   Title Patient will be able to ambulate 1050' without AD on level ground with SBA to improve short community ambulation    Baseline has not walked long since surgery; 11/15/20 370ft in clinic with S    Time 4    Period Weeks    Status On-going    Target Date 11/05/20      PT SHORT TERM GOAL #2   Title Pt will demo 5x sit to stand score <20 seconds without HHA to improve functional strength in bil LE    Baseline 27 seconds (10/08/20)    Time 4    Period Weeks    Status On-going    Target Date 11/05/20      PT SHORT TERM GOAL #3   Title Patient will be able to ambulate 400 feet on grass without AD and SBA to  improve safe community negotiation    Baseline not attempted after surgery (10/08/20)    Time 4    Period Weeks    Status On-going    Target Date 11/05/20               PT Long Term Goals - 10/08/20 1554       PT LONG TERM GOAL #1   Title Pt will demo <12 seconds with 5x sit to stand without HHA to improve functional strength in bil LE    Baseline 27 sec (10/08/20)    Time 8    Period Weeks    Status New    Target Date 12/03/20      PT LONG TERM GOAL #2   Title Pt will be able to ambulate 25 min without AD with walking program at home to improve walking endurance to access community    Baseline not attempted, walking program issued (10/08/20)    Time 8    Period Weeks    Status New    Target Date 12/03/20      PT LONG TERM GOAL #3   Title Patient will demo >20/30 on functional gait assessment to improve functional balance and gait    Baseline 9/30 (10/08/20)    Time 8    Period Weeks    Status New    Target Date 12/03/20                   Plan - 11/15/20 1131     Clinical Impression Statement Focus today was SLS tasks emphasizing accuracy of foot placement.  Advanced to B hip strengthening and added to HEP.  STeps ups in anterior and lateral direction as well as step downs incorporated into rogram.    Personal Factors and Comorbidities Comorbidity 3+    Comorbidities Neurofibromatosis, hx of cervical fusions, chronic neck pain, back pain, carpal tunnel syndrome, L foot drop    Examination-Activity Limitations Bend;Carry;Dressing;Lift;Reach Overhead;Sit;Sleep;Squat;Stairs;Stand;Transfers;Bathing    Examination-Participation Restrictions Cleaning;Community Activity;Driving;Laundry;Meal Prep;Occupation;Shop;Yard Work    Conservation officer, historic buildings Evolving/Moderate complexity    Rehab Potential Good    PT Frequency 2x / week  PT Duration 8 weeks    PT Treatment/Interventions ADLs/Self Care Home Management;Cryotherapy;Electrical Stimulation;Moist  Heat;Traction;Gait training;Stair training;Functional mobility training;Therapeutic activities;Therapeutic exercise;Balance training;Neuromuscular re-education;Patient/family education;Orthotic Fit/Training;Manual techniques;Scar mobilization;Passive range of motion;Dry needling;Energy conservation;Spinal Manipulations;Joint Manipulations    PT Next Visit Plan standing balance, stepping, gait training, core tasks, tall kneeling and L hip stabilization tasks as tolerated, check STGs    PT Home Exercise Plan Walking program, [id number]    Consulted and Agree with Plan of Care Patient             Patient will benefit from skilled therapeutic intervention in order to improve the following deficits and impairments:  Abnormal gait, Decreased activity tolerance, Decreased balance, Decreased range of motion, Decreased mobility, Decreased endurance, Decreased skin integrity, Decreased strength, Difficulty walking, Increased fascial restricitons, Increased muscle spasms, Impaired flexibility, Impaired sensation, Impaired tone, Impaired UE functional use, Improper body mechanics, Postural dysfunction, Pain  Visit Diagnosis: Muscle weakness (generalized)  Unsteadiness on feet  Other lack of coordination     Problem List Patient Active Problem List   Diagnosis Date Noted   Abscess of right foot 08/16/2020   Cellulitis of right foot 08/16/2020   Microhematuria 08/03/2020   Gastroesophageal reflux disease 07/07/2019   External otitis of right ear 03/09/2017   Thumb laceration, right, initial encounter 03/09/2017   Weakness of left side of body 11/19/2016   Stress due to illness of family member 06/10/2016   Syncope 04/23/2016   Onychomycosis 04/23/2016   Impacted ear wax 04/23/2016   Constipation due to opioid therapy 06/13/2014   Chronic pain syndrome 02/21/2014   Allergic reaction 02/20/2014   Neurofibromatosis, type 1 (HCC) 12/14/2013   Status post cervical arthrodesis 11/21/2013    Cervical myelopathy (HCC) 09/21/2013   Preventative health care 06/23/2010   HORNER'S SYNDROME 04/12/2009   Other psoriasis 08/24/2007   Tinea pedis 08/24/2007    Hildred Laser, PT 11/15/2020, 11:39 AM  [facility] [address] [place], [place], [address] Phone: [phone]   Fax:  [phone]  Name: Aundra Espin MRN: [id number] Date of Birth: 1980/04/02

## 2020-11-19 ENCOUNTER — Other Ambulatory Visit: Payer: Self-pay | Admitting: Internal Medicine

## 2020-11-19 ENCOUNTER — Ambulatory Visit: Payer: Managed Care, Other (non HMO) | Admitting: Rehabilitation

## 2020-11-19 ENCOUNTER — Encounter: Payer: Self-pay | Admitting: Occupational Therapy

## 2020-11-19 ENCOUNTER — Other Ambulatory Visit: Payer: Self-pay

## 2020-11-19 ENCOUNTER — Encounter: Payer: Self-pay | Admitting: Rehabilitation

## 2020-11-19 ENCOUNTER — Ambulatory Visit: Payer: Managed Care, Other (non HMO) | Admitting: Occupational Therapy

## 2020-11-19 DIAGNOSIS — R2681 Unsteadiness on feet: Secondary | ICD-10-CM

## 2020-11-19 DIAGNOSIS — R2689 Other abnormalities of gait and mobility: Secondary | ICD-10-CM

## 2020-11-19 DIAGNOSIS — R29898 Other symptoms and signs involving the musculoskeletal system: Secondary | ICD-10-CM

## 2020-11-19 DIAGNOSIS — M6281 Muscle weakness (generalized): Secondary | ICD-10-CM | POA: Diagnosis not present

## 2020-11-19 DIAGNOSIS — R278 Other lack of coordination: Secondary | ICD-10-CM

## 2020-11-19 DIAGNOSIS — R293 Abnormal posture: Secondary | ICD-10-CM

## 2020-11-19 DIAGNOSIS — R208 Other disturbances of skin sensation: Secondary | ICD-10-CM

## 2020-11-19 NOTE — Therapy (Signed)
[facility] [address] [place], [place], [address] Phone: [phone]   Fax:  [phone]  Occupational Therapy Treatment  Patient Details  Name: Collen Vincent MRN: [id number] Date of Birth: 13-Aug-1980 Referring Provider (OT): Dr. Raynald Kemp   Encounter Date: 11/19/2020   OT End of Session - 11/19/20 0749     Visit Number 10    Number of Visits 25    Date for OT Re-Evaluation 01/01/21    Authorization Type cigna    Authorization - Visit Number 10    Authorization - Number of Visits 30    OT Start Time [phone]    OT Stop Time 0758    OT Time Calculation (min) 40 min             Past Medical History:  Diagnosis Date   GERD (gastroesophageal reflux disease) 01/21/2017   MRSA (methicillin resistant Staphylococcus aureus) 2005   leg   Neurofibromatosis    tumor down spine and brain    Past Surgical History:  Procedure Laterality Date   hydrocephalus     [place]unt 1999[place]   NECK SURGERY      tumor removal   SPINE SURGERY  10/2013   c2-t2 fusion  , laminectomy c1-6-- Dr Raynald Kemp-- [place]   VASECTOMY  02/2013    There were no vitals filed for this visit.   Subjective Assessment - 11/19/20 0720     Subjective  Pt reports numbness in RUE    Pertinent History Helmut Hennon is a 40 y.o. 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 Raynald Kemp. PMH: Neurofibromatosis, type 1,   debulking of cervical spine tumor/Cervical athrodesis, Horners Syndrome, and hydrocephalus, numerous cervical surgeries over the years (6-7    Limitations no restrictions per MD now, progress at pt's own comfort level    Currently in Pain? Yes    Pain Score 5     Pain Location Shoulder    Pain Descriptors / Indicators Aching    Pain Type Chronic pain    Pain Onset More than a month ago    Pain Frequency Constant    Aggravating Factors  activity    Pain Relieving Factors rest                          Pt reports increased pain today after working his first 8 hour day yesterday. Pt reports increased numbness in RUE after work. Discussed proper positioning, use of wrist brace. Pt attempted theraband exercises however pt had significant pain. Pt returned to seated fine motor coordination activities for RUE functional use. Purdue pegboard with max difficulty, then pt transitioned to placing various sized pegs in semi circle, with RUE, min difficulty. Pt requested to transition to supine on mat due to pain. [place] to neck and shoulders x 7 mins then to low back x 5 mins no adverse reactions.           OT Short Term Goals - 11/12/20 1118       OT SHORT TERM GOAL #1   Title Pt will be I with HEP -11/20/20    Time 6    Period Weeks    Status On-going    Target Date 11/20/20      OT SHORT TERM GOAL #2   Title Pt will demonstrate improved coordination for ADLs as evidenced by placing 9 pegs in 2 mins  or less for RUE  Baseline 6 pegs placed in 2 mins    Time 6    Period Weeks    Status On-going      OT SHORT TERM GOAL #3   Title Pt will perform bathing and dressing with min A    Time 6    Period Weeks    Status On-going      OT SHORT TERM GOAL #4   Title Pt will demonstrate understaning of adapted strategies/ AE to assist with ADLS (such as feeding, cutting food, using computer mouse.)    Time 6    Period Weeks    Status On-going      OT SHORT TERM GOAL #5   Title Pt will demonstrate improved coordination for ADLs as evidenced by decreasing 9 hole peg test to 50 secs or less for LUE    Baseline LUE 55.72 secs    Time 6    Period Weeks    Status On-going      OT SHORT TERM GOAL #6   Title Pt will demonstrate improved functional use of RUE as evidenced by increasing box/ blocks score to 37 blocks or greater.    Time 6    Period Weeks    Status On-going               OT Long Term Goals - 10/31/20 1322       OT LONG TERM GOAL #1    Title Pt will be I with upgraded HEP prn-01/02/20    Time 12    Period Weeks    Status New      OT LONG TERM GOAL #2   Title Pt will demonstrate improved coordination for ADLs as evidenced by completing 9 hole peg test in 90 secs or less with RUE.    Baseline 6 pegs placed in 2 mins    Time 12    Period Weeks    Status New      OT LONG TERM GOAL #3   Title Pt will perfrorm all basic ADLs modified indpendently    Time 12    Period Weeks    Status New      OT LONG TERM GOAL #4   Title Patient will be able to type with both hands within reasonable period of time in preparation for work activities.    Time 12    Period Weeks    Status New      OT LONG TERM GOAL #5   Title Pt will perform basic home management and cooking modified independently    Time 12    Period Weeks    Status New      OT LONG TERM GOAL #6   Title Pt will improve ability to fasten buttons as shown by being able to fasten/unfasten 3 buttons in less than 60 secs or less    Time 12    Period Weeks    Status New                   Plan - 11/19/20 0749     Clinical Impression Statement Pt continues to progress. Pt has increase pain today and so activities were discontinued so that pt could lay down supine on mat to stretch out. Pt worked his first 8 hr day yesterday without adequate rest breaks and it may have impacted his pain. Discussed reducing his hours today and taking more frequent rest breaks.    OT Occupational Profile and History Problem Focused Assessment -  Including review of records relating to presenting problem    Occupational performance deficits (Please refer to evaluation for details): ADL's;IADL's;Work;Leisure;Play;Rest and Sleep;Social Participation    Body Structure / Function / Physical Skills ADL;Flexibility;ROM;UE functional use;Decreased knowledge of use of DME;FMC;Balance;Dexterity;Gait;Sensation;GMC;Endurance;IADL;Coordination;Strength;Pain    Rehab Potential Good     Clinical Decision Making Several treatment options, min-mod task modification necessary    Comorbidities Affecting Occupational Performance: May have comorbidities impacting occupational performance    Modification or Assistance to Complete Evaluation  Min-Moderate modification of tasks or assist with assess necessary to complete eval    OT Frequency 2x / week   plus eval, anticipate d/c after 8 weeks dependent on progress.   OT Duration 12 weeks    OT Treatment/Interventions Self-care/ADL training;Ultrasound;Energy conservation;Patient/family education;DME and/or AE instruction;Paraffin;Passive range of motion;Balance training;Cryotherapy;Fluidtherapy    Plan review yellow theraband exercises, continue to address, coordination, typing    Consulted and Agree with Plan of Care Patient             Patient will benefit from skilled therapeutic intervention in order to improve the following deficits and impairments:   Body Structure / Function / Physical Skills: ADL, Flexibility, ROM, UE functional use, Decreased knowledge of use of DME, FMC, Balance, Dexterity, Gait, Sensation, GMC, Endurance, IADL, Coordination, Strength, Pain       Visit Diagnosis: Muscle weakness (generalized)  Unsteadiness on feet  Other lack of coordination  Other symptoms and signs involving the musculoskeletal system  Other disturbances of skin sensation  Other abnormalities of gait and mobility  Abnormal posture    Problem List Patient Active Problem List   Diagnosis Date Noted   Abscess of right foot 08/16/2020   Cellulitis of right foot 08/16/2020   Microhematuria 08/03/2020   Gastroesophageal reflux disease 07/07/2019   External otitis of right ear 03/09/2017   Thumb laceration, right, initial encounter 03/09/2017   Weakness of left side of body 11/19/2016   Stress due to illness of family member 06/10/2016   Syncope 04/23/2016   Onychomycosis 04/23/2016   Impacted ear wax 04/23/2016    Constipation due to opioid therapy 06/13/2014   Chronic pain syndrome 02/21/2014   Allergic reaction 02/20/2014   Neurofibromatosis, type 1 (HCC) 12/14/2013   Status post cervical arthrodesis 11/21/2013   Cervical myelopathy (HCC) 09/21/2013   Preventative health care 06/23/2010   HORNER'S SYNDROME 04/12/2009   Other psoriasis 08/24/2007   Tinea pedis 08/24/2007    Zyah Gomm, OT/L 11/19/2020, 10:10 AM  [facility] [address] [place], [place], [address] Phone: [phone]   Fax:  [phone]  Name: Traeger Sultana MRN: [id number] Date of Birth: 25-Mar-1980

## 2020-11-19 NOTE — Therapy (Signed)
[facility] [address] [place], [place], [address] Phone: [phone]   Fax:  [phone]  Physical Therapy Treatment  Patient Details  Name: Larry Riley MRN: [id number] Date of Birth: [birth year] Referring Provider (PT): Dr. Gaspar Cola   Encounter Date: 11/19/2020   PT End of Session - 11/19/20 0857     Visit Number 8    Number of Visits 17    Date for PT Re-Evaluation 12/03/20    Authorization Type [employer] (60v max combined)    Authorization Time Period 9/27-11/22    Authorization - Visit Number 8    Authorization - Number of Visits 60    Progress Note Due on Visit 10    PT Start Time 0802    PT Stop Time 0846    PT Time Calculation (min) 44 min    Equipment Utilized During Treatment Gait belt    Activity Tolerance Patient tolerated treatment well    Behavior During Therapy [facility] for tasks assessed/performed             Past Medical History:  Diagnosis Date   GERD (gastroesophageal reflux disease) 01/21/2017   MRSA (methicillin resistant Staphylococcus aureus) 2005   leg   Neurofibromatosis    tumor down spine and brain    Past Surgical History:  Procedure Laterality Date   hydrocephalus     shunt 1999-[place]   NECK SURGERY      tumor removal   SPINE SURGERY  10/2013   c2-t2 fusion  , laminectomy c1-6-- Dr Rigoberto Noel-- [place]   VASECTOMY  02/2013    There were no vitals filed for this visit.   Subjective Assessment - 11/19/20 0803     Subjective Pt reporting increased pain today, he did work full 8 hour day yesterday and did not get to take as long of a lunch as he wanted.    Pertinent History C2-T2 cervical fusion, neurofibromatosis, L foot drop    Patient Stated Goals Walk at least 1 mile, to get in and out of chair easier without using arms too much,    Currently in Pain? Yes    Pain Score 6     Pain Location Back    Pain Orientation Right;Left;Lower    Pain Descriptors / Indicators Aching     Pain Type Chronic pain    Pain Onset More than a month ago    Pain Frequency Constant    Aggravating Factors  activity    Pain Relieving Factors rest                               [facility] Adult PT Treatment/Exercise - 11/19/20 0828       Ambulation/Gait   Ambulation/Gait Yes    Ambulation/Gait Assistance 5: Supervision    Ambulation/Gait Assistance Details Pt ambulated well over 1000' over varying outdoor surfaces paved and grass, stepping up/down curb at S level with AFO only.  Pt does have some imbalance in grass, however does very well self correcting with step strategy before continuing.    Ambulation Distance (Feet) 1400 Feet    Assistive device None   AFO   Gait Pattern Step-through pattern;Decreased hip/knee flexion - right    Ambulation Surface Level;Indoor;Unlevel;Outdoor;Paved;Grass      Neuro Re-ed    Neuro Re-ed Details  Standing on foam airex slowly performing alt cone taps x 20 reps with intermittent light UE support.  Standing on foam beam (  perpendicularly) with feet shoulder width apart EO maintaining balance x 20 secs>alt UE flex x 10 reps, alt UE flex with opposite head turn x 10 reps.  Still on foam beam alt forward steps x 10 reps then backwards stepping x 10 reps.      Exercises   Exercises Other Exercises    Other Exercises  Step ups (to 6" step) x 10 reps with opposite LE march x 10 reps.  Lateral step up/down x 10 reps both with light intermittent UE support.  Cues for relaxed posture.  Rest breaks needed in between tasks.      Knee/Hip Exercises: Aerobic   Stepper scifit LEs only, L4, 8 min, no arms seat 15                     PT Education - 11/19/20 0856     Education Details Trying to work maybe a 6 hour day vs 8 hour day and/or taking more breaks during day.    Person(s) Educated Patient    Methods Explanation    Comprehension Verbalized understanding              PT Short Term Goals - 11/19/20 0807       PT  SHORT TERM GOAL #1   Title Patient will be able to ambulate 1050' without AD on level ground with SBA to improve short community ambulation    Baseline met 11/19/20    Time 4    Period Weeks    Status Achieved    Target Date 11/05/20      PT SHORT TERM GOAL #2   Title Pt will demo 5x sit to stand score <20 seconds without HHA to improve functional strength in bil LE    Baseline 11.41 secs without UE support 11/19/20    Time 4    Period Weeks    Status Achieved    Target Date 11/05/20      PT SHORT TERM GOAL #3   Title Patient will be able to ambulate 400 feet on grass without AD and SBA to improve safe community negotiation    Baseline met 11/19/20    Time 4    Period Weeks    Status Achieved    Target Date 11/05/20               PT Long Term Goals - 11/19/20 0817       PT LONG TERM GOAL #1   Title Pt will demo <12 seconds with 5x sit to stand without HHA to improve functional strength in bil LE    Baseline 11.41 11/19/20    Time 8    Period Weeks    Status Achieved      PT LONG TERM GOAL #2   Title Pt will be able to ambulate 25 min without AD with walking program at home to improve walking endurance to access community    Baseline not attempted, walking program issued (10/08/20)    Time 8    Period Weeks    Status New      PT LONG TERM GOAL #3   Title Patient will demo >20/30 on functional gait assessment to improve functional balance and gait    Baseline 9/30 (10/08/20)    Time [employer] - 11/19/20 4097  Clinical Impression Statement Skilled session focused on assessment of STGS to which pt has met 3/3 STGS and also met 5TSS LTG today. He is progressing well and remainder of session continued to focus on LE strengthening and high level balance.  Pt tolerated well with intermittent UE support needed and rest breaks needed at times.    Personal Factors and Comorbidities Comorbidity 3+    Comorbidities  Neurofibromatosis, hx of cervical fusions, chronic neck pain, back pain, carpal tunnel syndrome, L foot drop    Examination-Activity Limitations Bend;Carry;Dressing;Lift;Reach Overhead;Sit;Sleep;Squat;Stairs;Stand;Transfers;Bathing    Examination-Participation Restrictions Cleaning;Community Activity;Driving;Laundry;Meal Prep;Occupation;Shop;Yard Work    [employer] Evolving/Moderate complexity    Rehab Potential Good    PT Frequency 2x / week    PT Duration 8 weeks    PT Treatment/Interventions ADLs/Self Care Home Management;Cryotherapy;Electrical Stimulation;Moist Heat;Traction;Gait training;Stair training;Functional mobility training;Therapeutic activities;Therapeutic exercise;Balance training;Neuromuscular re-education;Patient/family education;Orthotic Fit/Training;Manual techniques;Scar mobilization;Passive range of motion;Dry needling;Energy conservation;Spinal Manipulations;Joint Manipulations    PT Next Visit Plan standing balance, stepping, gait training, core tasks, tall kneeling and L hip stabilization tasks as tolerated    PT Home Exercise Plan Walking program, [id number]    Consulted and Agree with Plan of Care Patient             Patient will benefit from skilled therapeutic intervention in order to improve the following deficits and impairments:  Abnormal gait, Decreased activity tolerance, Decreased balance, Decreased range of motion, Decreased mobility, Decreased endurance, Decreased skin integrity, Decreased strength, Difficulty walking, Increased fascial restricitons, Increased muscle spasms, Impaired flexibility, Impaired sensation, Impaired tone, Impaired UE functional use, Improper body mechanics, Postural dysfunction, Pain  Visit Diagnosis: Muscle weakness (generalized)  Unsteadiness on feet  Other abnormalities of gait and mobility     Problem List Patient Active Problem List   Diagnosis Date Noted   Abscess of right foot 08/16/2020    Cellulitis of right foot 08/16/2020   Microhematuria 08/03/2020   Gastroesophageal reflux disease 07/07/2019   External otitis of right ear 03/09/2017   Thumb laceration, right, initial encounter 03/09/2017   Weakness of left side of body 11/19/2016   Stress due to illness of family member 06/10/2016   Syncope 04/23/2016   Onychomycosis 04/23/2016   Impacted ear wax 04/23/2016   Constipation due to opioid therapy 06/13/2014   Chronic pain syndrome 02/21/2014   Allergic reaction 02/20/2014   Neurofibromatosis, type 1 ([place]) 12/14/2013   Status post cervical arthrodesis 11/21/2013   Cervical myelopathy ([place]) 09/21/2013   Preventative health care 06/23/2010   HORNER'S SYNDROME 04/12/2009   Other psoriasis 08/24/2007   Tinea pedis 08/24/2007    Cameron Sprang, PT, MPT [facility] [address] [place], [place], [address] Phone: [phone]   Fax:  [phone] 11/19/20, 9:00 AM   Name: Larry Riley MRN: [id number] Date of Birth: March 06, 1980

## 2020-11-22 ENCOUNTER — Ambulatory Visit: Payer: Managed Care, Other (non HMO) | Admitting: Occupational Therapy

## 2020-11-22 ENCOUNTER — Ambulatory Visit: Payer: Managed Care, Other (non HMO)

## 2020-11-22 ENCOUNTER — Other Ambulatory Visit: Payer: Self-pay

## 2020-11-22 DIAGNOSIS — M6281 Muscle weakness (generalized): Secondary | ICD-10-CM

## 2020-11-22 DIAGNOSIS — R278 Other lack of coordination: Secondary | ICD-10-CM

## 2020-11-22 DIAGNOSIS — R2681 Unsteadiness on feet: Secondary | ICD-10-CM

## 2020-11-22 DIAGNOSIS — R208 Other disturbances of skin sensation: Secondary | ICD-10-CM

## 2020-11-22 DIAGNOSIS — R2689 Other abnormalities of gait and mobility: Secondary | ICD-10-CM

## 2020-11-22 DIAGNOSIS — R293 Abnormal posture: Secondary | ICD-10-CM

## 2020-11-22 NOTE — Patient Instructions (Signed)
Shoulder: Flexion (Supine)    With hands shoulder width apart, slowly lower dowel to floor behind head. Do not let elbows bend. Keep back flat. Hold __5__ seconds. Repeat __10__ times. Do __1-2__ sessions per day. CAUTION: Stretch slowly and gently.  Copyright  VHI. All rights reserved.    Also lay on your back perform chest press, start with elbows bent then straighten arms towards ceiling 10 reps 1-2 x day     Flexor Tendon Gliding (Active Hook Fist)   With fingers and knuckles straight, bend middle and tip joints. Do not bend large knuckles. Repeat _10-15___ times. Do _3__ sessions per day.  MP Flexion (Active)   With back of hand on table, bend large knuckles as far as they will go, keeping small joints straight. Repeat _10-15___ times. Do __3__ sessions per day. Activity: Reach into a narrow container.*      Finger Flexion / Extension   With palm up, bend fingers of left hand toward palm, making a  fist. Straighten fingers, opening fist. Repeat sequence _10-15___ times per session. Do _3__ sessions per day. Hand Variation: Palm down   Copyright  VHI. All rights reserved.

## 2020-11-22 NOTE — Therapy (Signed)
[facility] [address] [place], [place], [address] Phone: [phone]   Fax:  [phone]  Physical Therapy Treatment  Patient Details  Name: Larry Riley MRN: [id number] Date of Birth: 11/11/1980 Referring Provider (PT): Dr. Mindi Junker   Encounter Date: 11/22/2020   PT End of Session - 11/22/20 0806     Visit Number 9    Number of Visits 17    Date for PT Re-Evaluation 12/03/20    Authorization Type [employer] (60v max combined)    Authorization Time Period 9/27-11/22    Authorization - Visit Number 8    Authorization - Number of Visits 60    Progress Note Due on Visit 10    PT Start Time 0800    PT Stop Time 0845    PT Time Calculation (min) 45 min    Equipment Utilized During Treatment Gait belt    Activity Tolerance Patient tolerated treatment well    Behavior During Therapy [facility] for tasks assessed/performed             Past Medical History:  Diagnosis Date   GERD (gastroesophageal reflux disease) 01/21/2017   MRSA (methicillin resistant Staphylococcus aureus) 2005   leg   Neurofibromatosis    tumor down spine and brain    Past Surgical History:  Procedure Laterality Date   hydrocephalus     shunt 1999[place]   NECK SURGERY      tumor removal   SPINE SURGERY  10/2013   c2-t2 fusion  , laminectomy c1-6-- Dr Raynald Kemp-- [place]   VASECTOMY  02/2013    There were no vitals filed for this visit.   Subjective Assessment - 11/22/20 0800     Subjective No new c/o, has returned to full work days working from home.    Pertinent History C2-T2 cervical fusion, neurofibromatosis, L foot drop    How long can you sit comfortably? >30 min    How long can you stand comfortably? >15 min    How long can you walk comfortably? >15 min    Patient Stated Goals Walk at least 1 mile, to get in and out of chair easier without using arms too much,    Pain Onset More than a month ago    Pain Onset More than a month ago                                [facility] Adult PT Treatment/Exercise - 11/22/20 0001       Transfers   Transfers Sit to Stand;Stand to Sit;Stand Pivot Transfers    Sit to Stand 5: Supervision    Stand to Sit 5: Supervision    Stand Pivot Transfers 5: Supervision      Ambulation/Gait   Ambulation/Gait Yes    Ambulation/Gait Assistance 5: Supervision;6: Modified independent (Device/Increase time)    Ambulation Distance (Feet) 100 Feet    Assistive device None    Gait Pattern Step-through pattern;Decreased hip/knee flexion - right    Ambulation Surface Level;Indoor    Stairs Yes    Stairs Assistance 5: Supervision    Stair Management Technique One rail Right;Alternating pattern    Number of Stairs 16    Gait Comments through clinic.      Lumbar Exercises: Seated   Other Seated Lumbar Exercises seated core of hip/shoulder tosses, Vs and chops with 4.4# ball, 10x ea.      Knee/Hip Exercises: Aerobic  Stepper scifit L2, 8 min, arms6 x 8 min                 Balance Exercises - 11/22/20 0001       Balance Exercises: Standing   Stepping Strategy Anterior;Lateral;Foam/compliant surface;UE support;Limitations    Stepping Strategy Limitations From airex stepping and WS onto 6" block, ntermittent UE assist    Tandem Gait Forward;Retro;Upper extremity support;5 reps;Limitations;Foam/compliant surface    Tandem Gait Limitations tandem gait, fwd/bwd, 5 trips                  PT Short Term Goals - 11/19/20 0807       PT SHORT TERM GOAL #1   Title Patient will be able to ambulate 1050' without AD on level ground with SBA to improve short community ambulation    Baseline met 11/19/20    Time 4    Period Weeks    Status Achieved    Target Date 11/05/20      PT SHORT TERM GOAL #2   Title Pt will demo 5x sit to stand score <20 seconds without HHA to improve functional strength in bil LE    Baseline 11.41 secs without UE support 11/19/20    Time 4     Period Weeks    Status Achieved    Target Date 11/05/20      PT SHORT TERM GOAL #3   Title Patient will be able to ambulate 400 feet on grass without AD and SBA to improve safe community negotiation    Baseline met 11/19/20    Time 4    Period Weeks    Status Achieved    Target Date 11/05/20               PT Long Term Goals - 11/19/20 0817       PT LONG TERM GOAL #1   Title Pt will demo <12 seconds with 5x sit to stand without HHA to improve functional strength in bil LE    Baseline 11.41 11/19/20    Time 8    Period Weeks    Status Achieved      PT LONG TERM GOAL #2   Title Pt will be able to ambulate 25 min without AD with walking program at home to improve walking endurance to access community    Baseline not attempted, walking program issued (10/08/20)    Time 8    Period Weeks    Status New      PT LONG TERM GOAL #3   Title Patient will demo >20/30 on functional gait assessment to improve functional balance and gait    Baseline 9/30 (10/08/20)    Time 8    Period Weeks    Status New                   Plan - 11/22/20 0807     Clinical Impression Statement Todays session allowed for UE strengthening as he as cleared for gradual return to activity on 11/11/20 by MD.  Continued BLE and strengthening with stepping from compliant surface, tandem gait fwd/bwd on compliant surface with intermittent UE support.  Still shows need of UE support due to balance deficits.  Reviewed stair training with ability to negotiate 16 steps.    Personal Factors and Comorbidities Comorbidity 3+    Comorbidities Neurofibromatosis, hx of cervical fusions, chronic neck pain, back pain, carpal tunnel syndrome, L foot drop    Examination-Activity Limitations Bend;Carry;Dressing;Lift;Reach Overhead;Sit;Sleep;Squat;Stairs;Stand;Transfers;Bathing  Examination-Participation Restrictions Cleaning;Community Activity;Driving;Laundry;Meal Prep;Occupation;Shop;Yard Work    Copy Evolving/Moderate complexity    Rehab Potential Good    PT Frequency 2x / week    PT Duration 8 weeks    PT Treatment/Interventions ADLs/Self Care Home Management;Cryotherapy;Electrical Stimulation;Moist Heat;Traction;Gait training;Stair training;Functional mobility training;Therapeutic activities;Therapeutic exercise;Balance training;Neuromuscular re-education;Patient/family education;Orthotic Fit/Training;Manual techniques;Scar mobilization;Passive range of motion;Dry needling;Energy conservation;Spinal Manipulations;Joint Manipulations    PT Next Visit Plan tall kneeling and L hip stabilization tasks as tolerated, 10th visit note    PT Home Exercise Plan Walking program, [id number]    Consulted and Agree with Plan of Care Patient             Patient will benefit from skilled therapeutic intervention in order to improve the following deficits and impairments:  Abnormal gait, Decreased activity tolerance, Decreased balance, Decreased range of motion, Decreased mobility, Decreased endurance, Decreased skin integrity, Decreased strength, Difficulty walking, Increased fascial restricitons, Increased muscle spasms, Impaired flexibility, Impaired sensation, Impaired tone, Impaired UE functional use, Improper body mechanics, Postural dysfunction, Pain  Visit Diagnosis: Muscle weakness (generalized)  Unsteadiness on feet  Other abnormalities of gait and mobility     Problem List Patient Active Problem List   Diagnosis Date Noted   Abscess of right foot 08/16/2020   Cellulitis of right foot 08/16/2020   Microhematuria 08/03/2020   Gastroesophageal reflux disease 07/07/2019   External otitis of right ear 03/09/2017   Thumb laceration, right, initial encounter 03/09/2017   Weakness of left side of body 11/19/2016   Stress due to illness of family member 06/10/2016   Syncope 04/23/2016   Onychomycosis 04/23/2016   Impacted ear wax 04/23/2016   Constipation due to opioid  therapy 06/13/2014   Chronic pain syndrome 02/21/2014   Allergic reaction 02/20/2014   Neurofibromatosis, type 1 ([place]) 12/14/2013   Status post cervical arthrodesis 11/21/2013   Cervical myelopathy ([place]) 09/21/2013   Preventative health care 06/23/2010   HORNER'S SYNDROME 04/12/2009   Other psoriasis 08/24/2007   Tinea pedis 08/24/2007    Lanice Shirts, PT 11/22/2020, 8:45 AM  [facility] [address] [place], [place], [address] Phone: [phone]   Fax:  [phone]  Name: Muaad Boehning MRN: [id number] Date of Birth: 12-22-1980

## 2020-11-22 NOTE — Therapy (Signed)
[facility] [address] [place], [place], [address] Phone: [phone]   Fax:  [phone]  Occupational Therapy Treatment  Patient Details  Name: Larry Riley MRN: [id number] Date of Birth: 1980/04/02 Referring Provider (OT): Dr. Raynald Kemp   Encounter Date: 11/22/2020   OT End of Session - 11/22/20 1438     Visit Number 11    Number of Visits 25    Date for OT Re-Evaluation 01/01/21    Authorization Type cigna    Authorization - Visit Number 11    Authorization - Number of Visits 30    OT Start Time 0719    OT Stop Time 0758    OT Time Calculation (min) 39 min             Past Medical History:  Diagnosis Date   GERD (gastroesophageal reflux disease) 01/21/2017   MRSA (methicillin resistant Staphylococcus aureus) 2005   leg   Neurofibromatosis    tumor down spine and brain    Past Surgical History:  Procedure Laterality Date   hydrocephalus     [place]unt 1999[place]   NECK SURGERY      tumor removal   SPINE SURGERY  10/2013   c2-t2 fusion  , laminectomy c1-6-- Dr Raynald Kemp-- [facility]   VASECTOMY  02/2013    There were no vitals filed for this visit.   Subjective Assessment - 11/22/20 0738     Subjective  Pt reports that his pain is a little better today    Pertinent History Larry Riley is a 40 y.o. 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 Raynald Kemp. PMH: Neurofibromatosis, type 1,   debulking of cervical spine tumor/Cervical athrodesis, Horners Syndrome, and hydrocephalus, numerous cervical surgeries over the years (6-7    Limitations no restrictions per MD now, progress at pt's own comfort level    Currently in Pain? Yes   on arrival   Pain Score 5     Pain Location Back    Pain Orientation Right;Left;Lower    Pain Descriptors / Indicators Aching    Pain Type Chronic pain    Pain Onset More than a month ago    Pain Frequency Constant    Aggravating Factors   activity    Pain Relieving Factors rest                         Treatment: Placing medium sized pegs into pegboard with RUE for increased fine motor coordination, min difficulty/ v.c Arm bike x 4 mins level 1 for conditioning.          OT Education - 11/22/20 0740     Education Details cane exercise in supine- see handout, min v.c 10-20 eps each, tendon gliding exercises- pt returned demonstration and handout issued. D-ring splint for RUE for increaed comfort, pt was educated in application, wear, and precautions.    Person(s) Educated Patient    Methods Explanation;Demonstration;Verbal cues;Handout    Comprehension Verbalized understanding;Returned demonstration;Verbal cues required              OT Short Term Goals - 11/12/20 1118       OT SHORT TERM GOAL #1   Title Pt will be I with HEP -11/20/20    Time 6    Period Weeks    Status On-going    Target Date 11/20/20      OT SHORT TERM GOAL #2   Title  Pt will demonstrate improved coordination for ADLs as evidenced by placing 9 pegs in 2 mins  or less for RUE    Baseline 6 pegs placed in 2 mins    Time 6    Period Weeks    Status On-going      OT SHORT TERM GOAL #3   Title Pt will perform bathing and dressing with min A    Time 6    Period Weeks    Status On-going      OT SHORT TERM GOAL #4   Title Pt will demonstrate understaning of adapted strategies/ AE to assist with ADLS (such as feeding, cutting food, using computer mouse.)    Time 6    Period Weeks    Status On-going      OT SHORT TERM GOAL #5   Title Pt will demonstrate improved coordination for ADLs as evidenced by decreasing 9 hole peg test to 50 secs or less for LUE    Baseline LUE 55.72 secs    Time 6    Period Weeks    Status On-going      OT SHORT TERM GOAL #6   Title Pt will demonstrate improved functional use of RUE as evidenced by increasing box/ blocks score to 37 blocks or greater.    Time 6    Period Weeks    Status  On-going               OT Long Term Goals - 10/31/20 1322       OT LONG TERM GOAL #1   Title Pt will be I with upgraded HEP prn-01/02/20    Time 12    Period Weeks    Status New      OT LONG TERM GOAL #2   Title Pt will demonstrate improved coordination for ADLs as evidenced by completing 9 hole peg test in 90 secs or less with RUE.    Baseline 6 pegs placed in 2 mins    Time 12    Period Weeks    Status New      OT LONG TERM GOAL #3   Title Pt will perfrorm all basic ADLs modified indpendently    Time 12    Period Weeks    Status New      OT LONG TERM GOAL #4   Title Patient will be able to type with both hands within reasonable period of time in preparation for work activities.    Time 12    Period Weeks    Status New      OT LONG TERM GOAL #5   Title Pt will perform basic home management and cooking modified independently    Time 12    Period Weeks    Status New      OT LONG TERM GOAL #6   Title Pt will improve ability to fasten buttons as shown by being able to fasten/unfasten 3 buttons in less than 60 secs or less    Time 12    Period Weeks    Status New                   Plan - 11/22/20 1436     Clinical Impression Statement Pt is progressing towards goals. Pain remains a limiting factor. Pt reports he is trying to take more frquent rest breaks and get up and move more when working from home.    OT Occupational Profile and History Problem Focused Assessment - Including  review of records relating to presenting problem    Occupational performance deficits (Please refer to evaluation for details): ADL's;IADL's;Work;Leisure;Play;Rest and Sleep;Social Participation    Body Structure / Function / Physical Skills ADL;Flexibility;ROM;UE functional use;Decreased knowledge of use of DME;FMC;Balance;Dexterity;Gait;Sensation;GMC;Endurance;IADL;Coordination;Strength;Pain    Rehab Potential Good    Clinical Decision Making Several treatment options, min-mod  task modification necessary    Comorbidities Affecting Occupational Performance: May have comorbidities impacting occupational performance    Modification or Assistance to Complete Evaluation  Min-Moderate modification of tasks or assist with assess necessary to complete eval    OT Frequency 2x / week   plus eval, anticipate d/c after 8 weeks dependent on progress.   OT Duration 12 weeks    OT Treatment/Interventions Self-care/ADL training;Ultrasound;Energy conservation;Patient/family education;DME and/or AE instruction;Paraffin;Passive range of motion;Balance training;Cryotherapy;Fluidtherapy    Plan review cane exercise, continue to address, coordination, typing    Consulted and Agree with Plan of Care Patient             Patient will benefit from skilled therapeutic intervention in order to improve the following deficits and impairments:   Body Structure / Function / Physical Skills: ADL, Flexibility, ROM, UE functional use, Decreased knowledge of use of DME, FMC, Balance, Dexterity, Gait, Sensation, GMC, Endurance, IADL, Coordination, Strength, Pain       Visit Diagnosis: No diagnosis found.    Problem List Patient Active Problem List   Diagnosis Date Noted   Abscess of right foot 08/16/2020   Cellulitis of right foot 08/16/2020   Microhematuria 08/03/2020   Gastroesophageal reflux disease 07/07/2019   External otitis of right ear 03/09/2017   Thumb laceration, right, initial encounter 03/09/2017   Weakness of left side of body 11/19/2016   Stress due to illness of family member 06/10/2016   Syncope 04/23/2016   Onychomycosis 04/23/2016   Impacted ear wax 04/23/2016   Constipation due to opioid therapy 06/13/2014   Chronic pain syndrome 02/21/2014   Allergic reaction 02/20/2014   Neurofibromatosis, type 1 (HCC) 12/14/2013   Status post cervical arthrodesis 11/21/2013   Cervical myelopathy (HCC) 09/21/2013   Preventative health care 06/23/2010   HORNER'S SYNDROME  04/12/2009   Other psoriasis 08/24/2007   Tinea pedis 08/24/2007    Larry Riley, OT/L 11/22/2020, 2:39 PM  [facility] [address] [place], [place], [address] Phone: [phone]   Fax:  [phone]  Name: Larry Riley MRN: [id number] Date of Birth: Feb 13, 1980

## 2020-11-26 ENCOUNTER — Other Ambulatory Visit: Payer: Self-pay

## 2020-11-26 ENCOUNTER — Encounter: Payer: Self-pay | Admitting: Rehabilitation

## 2020-11-26 ENCOUNTER — Ambulatory Visit: Payer: Managed Care, Other (non HMO) | Admitting: Occupational Therapy

## 2020-11-26 ENCOUNTER — Ambulatory Visit: Payer: Managed Care, Other (non HMO) | Admitting: Rehabilitation

## 2020-11-26 DIAGNOSIS — R2689 Other abnormalities of gait and mobility: Secondary | ICD-10-CM

## 2020-11-26 DIAGNOSIS — R208 Other disturbances of skin sensation: Secondary | ICD-10-CM

## 2020-11-26 DIAGNOSIS — R2681 Unsteadiness on feet: Secondary | ICD-10-CM

## 2020-11-26 DIAGNOSIS — R29898 Other symptoms and signs involving the musculoskeletal system: Secondary | ICD-10-CM

## 2020-11-26 DIAGNOSIS — M6281 Muscle weakness (generalized): Secondary | ICD-10-CM

## 2020-11-26 DIAGNOSIS — R278 Other lack of coordination: Secondary | ICD-10-CM

## 2020-11-26 DIAGNOSIS — R293 Abnormal posture: Secondary | ICD-10-CM

## 2020-11-26 NOTE — Therapy (Signed)
[facility] [address] [place], [place], [address] Phone: [phone]   Fax:  [phone]  Occupational Therapy Treatment  Patient Details  Name: Larry Riley MRN: [id number] Date of Birth: 05-12-1980 Referring Provider (OT): Dr. Raynald Kemp   Encounter Date: 11/26/2020   OT End of Session - 11/26/20 0756     Visit Number 12    Number of Visits 25    Date for OT Re-Evaluation 01/01/21    Authorization Type cigna    Authorization - Visit Number 12    Authorization - Number of Visits 30    OT Start Time 0720    OT Stop Time 0800    OT Time Calculation (min) 40 min    Activity Tolerance Patient tolerated treatment well    Behavior During Therapy [facility] for tasks assessed/performed             Past Medical History:  Diagnosis Date   GERD (gastroesophageal reflux disease) 01/21/2017   MRSA (methicillin resistant Staphylococcus aureus) 2005   leg   Neurofibromatosis    tumor down spine and brain    Past Surgical History:  Procedure Laterality Date   hydrocephalus     [place]unt 1999[place]   NECK SURGERY      tumor removal   SPINE SURGERY  10/2013   c2-t2 fusion  , laminectomy c1-6-- Dr Raynald Kemp-- [place]   VASECTOMY  02/2013    There were no vitals filed for this visit.   Subjective Assessment - 11/26/20 0728     Pertinent History Larry Riley is a 40 y.o. 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 Raynald Kemp. PMH: Neurofibromatosis, type 1,   debulking of cervical spine tumor/Cervical athrodesis, Horners Syndrome, and hydrocephalus, numerous cervical surgeries over the years (6-7    Limitations no restrictions per MD now, progress at pt's own comfort level    Currently in Pain? Yes    Pain Score 4     Pain Location Back    Pain Orientation Lower    Pain Descriptors / Indicators Aching    Pain Type Chronic pain    Pain Onset More than a month ago    Pain Frequency Constant     Aggravating Factors  activity    Pain Relieving Factors rest    Multiple Pain Sites No                       Treatment: supine closed chain shoulder flexion, chest press and shoulder abduction, min v.c 10 reps each Seated at table, therapist checked progress towards short term goals- see goals for updates. Arm bike x 6 mins level 1 for conditioning Graded clothespins yellow-green for sustained grip, min v.c/ difficulty             OT Short Term Goals - 11/26/20 0730       OT SHORT TERM GOAL #1   Title Pt will be I with HEP -11/20/20    Time 6    Period Weeks    Status On-going    Target Date 11/20/20      OT SHORT TERM GOAL #2   Title Pt will demonstrate improved coordination for ADLs as evidenced by placing 9 pegs in 2 mins  or less for RUE    Baseline 6 pegs placed in 2 mins    Time 6    Period Weeks    Status Achieved   1  min 38 secs     OT SHORT TERM GOAL #3   Title Pt will perform bathing and dressing with min A    Time 6    Period Weeks    Status Achieved      OT SHORT TERM GOAL #4   Title Pt will demonstrate understaning of adapted strategies/ AE to assist with ADLS (such as feeding, cutting food, using computer mouse.)    Time 6    Period Weeks    Status On-going      OT SHORT TERM GOAL #5   Title Pt will demonstrate improved coordination for ADLs as evidenced by decreasing 9 hole peg test to 50 secs or less for LUE    Baseline LUE 55.72 secs    Time 6    Period Weeks    Status Achieved   50.82, 40.19     OT SHORT TERM GOAL #6   Title Pt will demonstrate improved functional use of RUE as evidenced by increasing box/ blocks score to 37 blocks or greater.    Time 6    Period Weeks    Status On-going   31 blocks              OT Long Term Goals - 11/26/20 0730       OT LONG TERM GOAL #1   Title Pt will be I with upgraded HEP prn-01/02/20    Time 12    Period Weeks    Status On-going      OT LONG TERM GOAL #2   Title Pt  will demonstrate improved coordination for ADLs as evidenced by completing 9 hole peg test in 90 secs or less with RUE.    Baseline 6 pegs placed in 2 mins    Time 12    Period Weeks    Status On-going      OT LONG TERM GOAL #3   Title Pt will perfrorm all basic ADLs modified indpendently    Time 12    Period Weeks    Status On-going      OT LONG TERM GOAL #4   Title Patient will be able to type with both hands within reasonable period of time in preparation for work activities.    Time 12    Period Weeks    Status On-going      OT LONG TERM GOAL #5   Title Pt will perform basic home management and cooking modified independently    Time 12    Period Weeks    Status New      OT LONG TERM GOAL #6   Title Pt will improve ability to fasten buttons as shown by being able to fasten/unfasten 3 buttons in less than 60 secs or less    Time 12    Period Weeks    Status New                   Plan - 11/26/20 0836     Clinical Impression Statement Pt is progressing towards goals. He reports his pain is better today and numbness is improved with use of new wrist brace.    OT Occupational Profile and History Problem Focused Assessment - Including review of records relating to presenting problem    Occupational performance deficits (Please refer to evaluation for details): ADL's;IADL's;Work;Leisure;Play;Rest and Sleep;Social Participation    Body Structure / Function / Physical Skills ADL;Flexibility;ROM;UE functional use;Decreased knowledge of use of DME;FMC;Balance;Dexterity;Gait;Sensation;GMC;Endurance;IADL;Coordination;Strength;Pain    Rehab Potential Good  Clinical Decision Making Several treatment options, min-mod task modification necessary    Comorbidities Affecting Occupational Performance: May have comorbidities impacting occupational performance    Modification or Assistance to Complete Evaluation  Min-Moderate modification of tasks or assist with assess necessary to  complete eval    OT Frequency 2x / week   plus eval, anticipate d/c after 8 weeks dependent on progress.   OT Duration 12 weeks    OT Treatment/Interventions Self-care/ADL training;Ultrasound;Energy conservation;Patient/family education;DME and/or AE instruction;Paraffin;Passive range of motion;Balance training;Cryotherapy;Fluidtherapy    Plan start with cane exercise, continue to address coordination, typing, consider issuing median n. gliding exercises    Consulted and Agree with Plan of Care Patient             Patient will benefit from skilled therapeutic intervention in order to improve the following deficits and impairments:   Body Structure / Function / Physical Skills: ADL, Flexibility, ROM, UE functional use, Decreased knowledge of use of DME, FMC, Balance, Dexterity, Gait, Sensation, GMC, Endurance, IADL, Coordination, Strength, Pain       Visit Diagnosis: Muscle weakness (generalized)  Unsteadiness on feet  Other abnormalities of gait and mobility  Other lack of coordination  Other disturbances of skin sensation  Abnormal posture  Other symptoms and signs involving the musculoskeletal system    Problem List Patient Active Problem List   Diagnosis Date Noted   Abscess of right foot 08/16/2020   Cellulitis of right foot 08/16/2020   Microhematuria 08/03/2020   Gastroesophageal reflux disease 07/07/2019   External otitis of right ear 03/09/2017   Thumb laceration, right, initial encounter 03/09/2017   Weakness of left side of body 11/19/2016   Stress due to illness of family member 06/10/2016   Syncope 04/23/2016   Onychomycosis 04/23/2016   Impacted ear wax 04/23/2016   Constipation due to opioid therapy 06/13/2014   Chronic pain syndrome 02/21/2014   Allergic reaction 02/20/2014   Neurofibromatosis, type 1 (HCC) 12/14/2013   Status post cervical arthrodesis 11/21/2013   Cervical myelopathy (HCC) 09/21/2013   Preventative health care 06/23/2010    HORNER'S SYNDROME 04/12/2009   Other psoriasis 08/24/2007   Tinea pedis 08/24/2007    Hosie Sharman, OT/L 11/26/2020, 8:39 AM  [facility] [address] [place], [place], [address] Phone: [phone]   Fax:  [phone]  Name: Larry Riley MRN: [id number] Date of Birth: [birth year]

## 2020-11-26 NOTE — Therapy (Signed)
[facility] [address] [place], [place], [address] Phone: [phone]   Fax:  [phone]  Physical Therapy Treatment  Patient Details  Name: Larry Riley MRN: [id number] Date of Birth: 26-Dec-1980 Referring Provider (PT): Dr. Gaspar Cola   Encounter Date: 11/26/2020   PT End of Session - 11/26/20 1123     Visit Number 10    Number of Visits 17    Date for PT Re-Evaluation 12/03/20    Authorization Type [employer] (60v max combined)    Authorization Time Period 9/27-11/22    Authorization - Visit Number 10    Authorization - Number of Visits 41    PT Start Time 0802    PT Stop Time 4496    PT Time Calculation (min) 42 min    Equipment Utilized During Treatment Gait belt    Activity Tolerance Patient tolerated treatment well    Behavior During Therapy [facility] for tasks assessed/performed             Past Medical History:  Diagnosis Date   GERD (gastroesophageal reflux disease) 01/21/2017   MRSA (methicillin resistant Staphylococcus aureus) 2005   leg   Neurofibromatosis    tumor down spine and brain    Past Surgical History:  Procedure Laterality Date   hydrocephalus     shunt 1999[place]   NECK SURGERY      tumor removal   SPINE SURGERY  10/2013   c2-t2 fusion  , laminectomy c1-6-- Dr Rigoberto Noel-- [place]   VASECTOMY  02/2013    There were no vitals filed for this visit.   Subjective Assessment - 11/26/20 0805     Subjective Pt reports lower pain this week vs last week.    Pertinent History C2-T2 cervical fusion, neurofibromatosis, L foot drop    Patient Stated Goals Walk at least 1 mile, to get in and out of chair easier without using arms too much,    Currently in Pain? Yes    Pain Score 4     Pain Location Back    Pain Orientation Lower    Pain Descriptors / Indicators Aching    Pain Type Chronic pain    Pain Onset More than a month ago    Pain Frequency Constant    Aggravating Factors  activity     Pain Relieving Factors rest    Multiple Pain Sites Yes    Pain Score 4    Pain Location Shoulder    Pain Orientation Left    Pain Descriptors / Indicators Aching    Pain Type Chronic pain    Pain Onset More than a month ago    Pain Frequency Intermittent    Aggravating Factors  use    Pain Relieving Factors rest                               [facility] Adult PT Treatment/Exercise - 11/26/20 0815       Ambulation/Gait   Ambulation/Gait Yes    Ambulation/Gait Assistance 4: Min assist    Ambulation/Gait Assistance Details High intensity gait training focusing on improving L swing and propulsion.  Weighted L LE with 7lbs x 115 with cues to increase speed.  Then added another 3lbs for total of 10lbs to LLE x 230'. Note speed and clearance both improved.   Did not collect pre gait speed, however post gait speed without weights was 3.85 ft/sec    Ambulation Distance (  Feet) 115 Feet   then 230   Assistive device None   AFO   Gait Pattern Step-through pattern;Decreased hip/knee flexion - right    Ambulation Surface Level;Indoor      Neuro Re-ed    Neuro Re-ed Details  High level balance with focus on improving reactionary balance strategy with large lateral steps and crossing midline to targets on floor called out by PT.  Performed x 2 mins, took break and added 7lbs to LLE and performed another 2 mins (provided intermittent min A as needed).  cues to keep body facing forward throughout.      Exercises   Other Exercises  Performed functional high intensity circuit for cardiovascular training as well as improved carryover of functional task.  Performed step ups with single UE support for 1 min, 30 sec rest, sit<>stand without UE support x 1 min, 30 sec rest and floor<>mat transfer x 1 min with 30 sec rest.  Performed 3 reps of each circuit.  HR remained within HR max with highest HR being 150 but quickly recovered to 120's.  Did note that stepping did demo improvement from rep  to rep.                     PT Education - 11/26/20 1122     Education Details purpose of circuit training and high intensity gait training    Person(s) Educated Patient    Methods Explanation    Comprehension Verbalized understanding              PT Short Term Goals - 11/19/20 0807       PT SHORT TERM GOAL #1   Title Patient will be able to ambulate 1050' without AD on level ground with SBA to improve short community ambulation    Baseline met 11/19/20    Time 4    Period Weeks    Status Achieved    Target Date 11/05/20      PT SHORT TERM GOAL #2   Title Pt will demo 5x sit to stand score <20 seconds without HHA to improve functional strength in bil LE    Baseline 11.41 secs without UE support 11/19/20    Time 4    Period Weeks    Status Achieved    Target Date 11/05/20      PT SHORT TERM GOAL #3   Title Patient will be able to ambulate 400 feet on grass without AD and SBA to improve safe community negotiation    Baseline met 11/19/20    Time 4    Period Weeks    Status Achieved    Target Date 11/05/20               PT Long Term Goals - 11/19/20 0817       PT LONG TERM GOAL #1   Title Pt will demo <12 seconds with 5x sit to stand without HHA to improve functional strength in bil LE    Baseline 11.41 11/19/20    Time 8    Period Weeks    Status Achieved      PT LONG TERM GOAL #2   Title Pt will be able to ambulate 25 min without AD with walking program at home to improve walking endurance to access community    Baseline not attempted, walking program issued (10/08/20)    Time 8    Period Weeks    Status New      PT LONG TERM  GOAL #3   Title Patient will demo >20/30 on functional gait assessment to improve functional balance and gait    Baseline 9/30 (10/08/20)    Time 8    Period Weeks    Status New                   Plan - 11/26/20 1125     Clinical Impression Statement Skilled session focused on high intensity gait  training, circuit training and balance training with emphasis on reactionary strategy.  Pt tolerated very well maintaining withing 65-85% of HR max throughout session.    Personal Factors and Comorbidities Comorbidity 3+    Comorbidities Neurofibromatosis, hx of cervical fusions, chronic neck pain, back pain, carpal tunnel syndrome, L foot drop    Examination-Activity Limitations Bend;Carry;Dressing;Lift;Reach Overhead;Sit;Sleep;Squat;Stairs;Stand;Transfers;Bathing    Examination-Participation Restrictions Cleaning;Community Activity;Driving;Laundry;Meal Prep;Occupation;Shop;Yard Work    [employer] officer Evolving/Moderate complexity    Rehab Potential Good    PT Frequency 2x / week    PT Duration 8 weeks    PT Treatment/Interventions ADLs/Self Care Home Management;Cryotherapy;Electrical Stimulation;Moist Heat;Traction;Gait training;Stair training;Functional mobility training;Therapeutic activities;Therapeutic exercise;Balance training;Neuromuscular re-education;Patient/family education;Orthotic Fit/Training;Manual techniques;Scar mobilization;Passive range of motion;Dry needling;Energy conservation;Spinal Manipulations;Joint Manipulations    PT Next Visit Plan Continue high level balance, resisted gait with weight on LLE, treadmill training uphill.    PT Home Exercise Plan Walking program, [id number]    Consulted and Agree with Plan of Care Patient             Patient will benefit from skilled therapeutic intervention in order to improve the following deficits and impairments:  Abnormal gait, Decreased activity tolerance, Decreased balance, Decreased range of motion, Decreased mobility, Decreased endurance, Decreased skin integrity, Decreased strength, Difficulty walking, Increased fascial restricitons, Increased muscle spasms, Impaired flexibility, Impaired sensation, Impaired tone, Impaired UE functional use, Improper body mechanics, Postural dysfunction, Pain  Visit  Diagnosis: Muscle weakness (generalized)  Unsteadiness on feet  Other abnormalities of gait and mobility     Problem List Patient Active Problem List   Diagnosis Date Noted   Abscess of right foot 08/16/2020   Cellulitis of right foot 08/16/2020   Microhematuria 08/03/2020   Gastroesophageal reflux disease 07/07/2019   External otitis of right ear 03/09/2017   Thumb laceration, right, initial encounter 03/09/2017   Weakness of left side of body 11/19/2016   Stress due to illness of family member 06/10/2016   Syncope 04/23/2016   Onychomycosis 04/23/2016   Impacted ear wax 04/23/2016   Constipation due to opioid therapy 06/13/2014   Chronic pain syndrome 02/21/2014   Allergic reaction 02/20/2014   Neurofibromatosis, type 1 ([place]) 12/14/2013   Status post cervical arthrodesis 11/21/2013   Cervical myelopathy ([place]) 09/21/2013   Preventative health care 06/23/2010   HORNER'S SYNDROME 04/12/2009   Other psoriasis 08/24/2007   Tinea pedis 08/24/2007    Cameron Sprang, PT, MPT [facility] [address] [place], [place], [address] Phone: [phone]   Fax:  [phone] 11/26/20, 11:30 AM   Name: Larry Riley MRN: [id number] Date of Birth: 03-Mar-1980

## 2020-11-29 ENCOUNTER — Ambulatory Visit: Payer: Managed Care, Other (non HMO) | Admitting: Occupational Therapy

## 2020-11-29 ENCOUNTER — Ambulatory Visit: Payer: Managed Care, Other (non HMO)

## 2020-11-29 ENCOUNTER — Other Ambulatory Visit: Payer: Self-pay

## 2020-11-29 DIAGNOSIS — R293 Abnormal posture: Secondary | ICD-10-CM

## 2020-11-29 DIAGNOSIS — M542 Cervicalgia: Secondary | ICD-10-CM

## 2020-11-29 DIAGNOSIS — R2681 Unsteadiness on feet: Secondary | ICD-10-CM

## 2020-11-29 DIAGNOSIS — R278 Other lack of coordination: Secondary | ICD-10-CM

## 2020-11-29 DIAGNOSIS — R208 Other disturbances of skin sensation: Secondary | ICD-10-CM

## 2020-11-29 DIAGNOSIS — M6281 Muscle weakness (generalized): Secondary | ICD-10-CM | POA: Diagnosis not present

## 2020-11-29 DIAGNOSIS — R29898 Other symptoms and signs involving the musculoskeletal system: Secondary | ICD-10-CM

## 2020-11-29 DIAGNOSIS — R2689 Other abnormalities of gait and mobility: Secondary | ICD-10-CM

## 2020-11-29 NOTE — Therapy (Signed)
[facility] [address] [place], [place], [address] Phone: [phone]   Fax:  [phone]  Physical Therapy Treatment  Patient Details  Name: Larry Riley MRN: [id number] Date of Birth: 04/20/1980 Referring Provider (PT): Dr. Mindi Junker   Encounter Date: 11/29/2020   PT End of Session - 11/29/20 0759     Visit Number 11    Number of Visits 17    Date for PT Re-Evaluation 12/03/20    Authorization Type [employer] (60v max combined)    Authorization Time Period 9/27-11/22    Authorization - Visit Number 10    Authorization - Number of Visits 60    Progress Note Due on Visit 10    PT Start Time 0805    PT Stop Time 0845    PT Time Calculation (min) 40 min    Equipment Utilized During Treatment Gait belt    Activity Tolerance Patient tolerated treatment well    Behavior During Therapy [facility] for tasks assessed/performed             Past Medical History:  Diagnosis Date   GERD (gastroesophageal reflux disease) 01/21/2017   MRSA (methicillin resistant Staphylococcus aureus) 2005   leg   Neurofibromatosis    tumor down spine and brain    Past Surgical History:  Procedure Laterality Date   hydrocephalus     shunt 1999[place]   NECK SURGERY      tumor removal   SPINE SURGERY  10/2013   c2-t2 fusion  , laminectomy c1-6-- Dr Raynald Kemp-- [place]   VASECTOMY  02/2013    There were no vitals filed for this visit.   Subjective Assessment - 11/29/20 0803     Subjective Awoke with back pain and needed to take a pain pill this morning.  Experienced sharp pains in cervical regio with prolonged standing earlier from prolongd flexed position and needed to change to more upright posture    Pertinent History C2-T2 cervical fusion, neurofibromatosis, L foot drop    How long can you sit comfortably? >30 min    How long can you stand comfortably? >15 min    How long can you walk comfortably? >15 min    Patient Stated Goals Walk  at least 1 mile, to get in and out of chair easier without using arms too much,    Pain Onset More than a month ago    Pain Onset More than a month ago                               [facility] Adult PT Treatment/Exercise - 11/29/20 0001       Transfers   Transfers Sit to Stand;Stand to Sit;Stand Pivot Transfers    Sit to Stand 6: Modified independent (Device/Increase time)    Stand to Sit 6: Modified independent (Device/Increase time)    Stand Pivot Transfers 6: Modified independent (Device/Increase time)      Ambulation/Gait   Gait Comments see treadmill data      Knee/Hip Exercises: Aerobic   Tread Mill 0%, 1.5 mph, baseline HR 116, post task HR 108, 12' duration with need of B UE support      Knee/Hip Exercises: Seated   Long Arc Quad Strengthening;Both;1 set;15 reps;Weights;Limitations    Long Arc Quad Weight 3 lbs.    Long [employer] Limitations with lat press alternating, weight on R only    Marching Strengthening;Both;1 set;15 reps;Weights;Limitations  Marching Limitations alt with lat press, 3# on R                 Balance Exercises - 11/29/20 0001       Balance Exercises: Standing   Stepping Strategy Anterior;Foam/compliant surface;Limitations    Stepping Strategy Limitations from airex, int UE support, cone taps unilateral 10x per LE, double taps 10x per LE    Step Over Hurdles / Cones floor ladder w/o UE support, CGA, 4 trips forward, 4 trips sidestepping                  PT Short Term Goals - 11/19/20 0807       PT SHORT TERM GOAL #1   Title Patient will be able to ambulate 1050' without AD on level ground with SBA to improve short community ambulation    Baseline met 11/19/20    Time 4    Period Weeks    Status Achieved    Target Date 11/05/20      PT SHORT TERM GOAL #2   Title Pt will demo 5x sit to stand score <20 seconds without HHA to improve functional strength in bil LE    Baseline 11.41 secs without UE support  11/19/20    Time 4    Period Weeks    Status Achieved    Target Date 11/05/20      PT SHORT TERM GOAL #3   Title Patient will be able to ambulate 400 feet on grass without AD and SBA to improve safe community negotiation    Baseline met 11/19/20    Time 4    Period Weeks    Status Achieved    Target Date 11/05/20               PT Long Term Goals - 11/19/20 0817       PT LONG TERM GOAL #1   Title Pt will demo <12 seconds with 5x sit to stand without HHA to improve functional strength in bil LE    Baseline 11.41 11/19/20    Time 8    Period Weeks    Status Achieved      PT LONG TERM GOAL #2   Title Pt will be able to ambulate 25 min without AD with walking program at home to improve walking endurance to access community    Baseline not attempted, walking program issued (10/08/20)    Time 8    Period Weeks    Status New      PT LONG TERM GOAL #3   Title Patient will demo >20/30 on functional gait assessment to improve functional balance and gait    Baseline 9/30 (10/08/20)    Time 8    Period Weeks    Status New                   Plan - 11/29/20 1657     Clinical Impression Statement Treatment intensity lessened to accomodate pain levels and potential medication side effects.  Resumed core exercises, added teradmill for aerobic tolerance, floor ladder for balanc and reactionary strategies, cone taps from airex.    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  PT Frequency 2x / week    PT Duration 8  weeks    PT Treatment/Interventions ADLs/Self Care Home Management;Cryotherapy;Electrical Stimulation;Moist Heat;Traction;Gait training;Stair training;Functional mobility training;Therapeutic activities;Therapeutic exercise;Balance training;Neuromuscular re-education;Patient/family education;Orthotic Fit/Training;Manual techniques;Scar mobilization;Passive range of motion;Dry needling;Energy conservation;Spinal Manipulations;Joint Manipulations    PT Next Visit Plan Continue high level balance, resisted gait with weight on LLE, treadmill training uphill, core as needed, reactionary strategies    PT Home Exercise Plan Walking program, [id number]    Consulted and Agree with Plan of Care Patient             Patient will benefit from skilled therapeutic intervention in order to improve the following deficits and impairments:  Abnormal gait, Decreased activity tolerance, Decreased balance, Decreased range of motion, Decreased mobility, Decreased endurance, Decreased skin integrity, Decreased strength, Difficulty walking, Increased fascial restricitons, Increased muscle spasms, Impaired flexibility, Impaired sensation, Impaired tone, Impaired UE functional use, Improper body mechanics, Postural dysfunction, Pain  Visit Diagnosis: Muscle weakness (generalized)  Cervicalgia  Unsteadiness on feet  Other abnormalities of gait and mobility     Problem List Patient Active Problem List   Diagnosis Date Noted   Abscess of right foot 08/16/2020   Cellulitis of right foot 08/16/2020   Microhematuria 08/03/2020   Gastroesophageal reflux disease 07/07/2019   External otitis of right ear 03/09/2017   Thumb laceration, right, initial encounter 03/09/2017   Weakness of left side of body 11/19/2016   Stress due to illness of family member 06/10/2016   Syncope 04/23/2016   Onychomycosis 04/23/2016   Impacted ear wax 04/23/2016   Constipation due to opioid therapy 06/13/2014   Chronic pain syndrome  02/21/2014   Allergic reaction 02/20/2014   Neurofibromatosis, type 1 ([place]) 12/14/2013   Status post cervical arthrodesis 11/21/2013   Cervical myelopathy ([place]) 09/21/2013   Preventative health care 06/23/2010   HORNER'S SYNDROME 04/12/2009   Other psoriasis 08/24/2007   Tinea pedis 08/24/2007    Lanice Shirts, PT 11/29/2020, 9:07 AM  [facility] [address] [place], [place], [address] Phone: [phone]   Fax:  [phone]  Name: Larry Riley MRN: [id number] Date of Birth: 1980-11-23

## 2020-11-29 NOTE — Therapy (Signed)
[facility] [address] [place], [place], [address] Phone: [phone]   Fax:  [phone]  Occupational Therapy Treatment  Patient Details  Name: Larry Riley MRN: [id number] Date of Birth: 09-13-1980 Referring Provider (OT): Dr. Raynald Kemp   Encounter Date: 11/29/2020   OT End of Session - 11/29/20 0906     Visit Number 13    Number of Visits 25    Date for OT Re-Evaluation 01/01/21    Authorization Type cigna    Authorization - Visit Number 13    Authorization - Number of Visits 30    OT Start Time [phone]    OT Stop Time 0800    OT Time Calculation (min) 39 min    Activity Tolerance Patient tolerated treatment well    Behavior During Therapy [facility] for tasks assessed/performed             Past Medical History:  Diagnosis Date   GERD (gastroesophageal reflux disease) 01/21/2017   MRSA (methicillin resistant Staphylococcus aureus) 2005   leg   Neurofibromatosis    tumor down spine and brain    Past Surgical History:  Procedure Laterality Date   hydrocephalus     shunt 1999[place]   NECK SURGERY      tumor removal   SPINE SURGERY  10/2013   c2-t2 fusion  , laminectomy c1-6-- Dr Raynald Kemp-- [place]   VASECTOMY  02/2013    There were no vitals filed for this visit.   Subjective Assessment - 11/29/20 0740     Subjective  Pt reports that his pain is a little better today    Pertinent History Larry Riley is a 40 y.o. 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 Raynald Kemp. PMH: Neurofibromatosis, type 1,   debulking of cervical spine tumor/Cervical athrodesis, Horners Syndrome, and hydrocephalus, numerous cervical surgeries over the years (6-7    Limitations no restrictions per MD now, progress at pt's own comfort level    Currently in Pain? Yes    Pain Score 4     Pain Location Back    Pain Orientation Lower    Pain Descriptors / Indicators Aching    Pain Type Chronic pain     Pain Onset More than a month ago    Pain Frequency Constant                Treatment: standing to place large-small pegs in semicircle with RUE, min difficulty with larger pegs, mod difficulty with smallest pegs, min v.c for positioning for pinch                  OT Treatment/Education - 11/29/20 0759     Education Details cane exercise in supine for chest press, shoulder flexion, shoulder abduction , tendon gliding exercises for RUE 10 reps min v.c., issued median n. glides handout, pt performs on elevated surface for upright posture- needs review of median n. glides    Person(s) Educated Patient    Methods Explanation;Demonstration;Verbal cues;Handout    Comprehension Verbalized understanding;Returned demonstration;Verbal cues required              OT Short Term Goals - 11/29/20 0742       OT SHORT TERM GOAL #1   Title Pt will be I with HEP -11/20/20    Time 6    Period Weeks    Status Achieved    Target Date 11/20/20      OT SHORT  TERM GOAL #2   Title Pt will demonstrate improved coordination for ADLs as evidenced by placing 9 pegs in 2 mins  or less for RUE    Baseline 6 pegs placed in 2 mins    Time 6    Period Weeks    Status Achieved   1 min 38 secs     OT SHORT TERM GOAL #3   Title Pt will perform bathing and dressing with min A    Time 6    Period Weeks    Status Achieved      OT SHORT TERM GOAL #4   Title Pt will demonstrate understaning of adapted strategies/ AE to assist with ADLS (such as feeding, cutting food, using computer mouse.)    Time 6    Period Weeks    Status On-going      OT SHORT TERM GOAL #5   Title Pt will demonstrate improved coordination for ADLs as evidenced by decreasing 9 hole peg test to 50 secs or less for LUE    Baseline LUE 55.72 secs    Time 6    Period Weeks    Status Achieved   50.82, 40.19     OT SHORT TERM GOAL #6   Title Pt will demonstrate improved functional use of RUE as evidenced by increasing  box/ blocks score to 37 blocks or greater.    Time 6    Period Weeks    Status On-going   31 blocks              OT Long Term Goals - 11/26/20 0730       OT LONG TERM GOAL #1   Title Pt will be I with upgraded HEP prn-01/02/20    Time 12    Period Weeks    Status On-going      OT LONG TERM GOAL #2   Title Pt will demonstrate improved coordination for ADLs as evidenced by completing 9 hole peg test in 90 secs or less with RUE.    Baseline 6 pegs placed in 2 mins    Time 12    Period Weeks    Status On-going      OT LONG TERM GOAL #3   Title Pt will perfrorm all basic ADLs modified indpendently    Time 12    Period Weeks    Status On-going      OT LONG TERM GOAL #4   Title Patient will be able to type with both hands within reasonable period of time in preparation for work activities.    Time 12    Period Weeks    Status On-going      OT LONG TERM GOAL #5   Title Pt will perform basic home management and cooking modified independently    Time 12    Period Weeks    Status New      OT LONG TERM GOAL #6   Title Pt will improve ability to fasten buttons as shown by being able to fasten/unfasten 3 buttons in less than 60 secs or less    Time 12    Period Weeks    Status New                   Plan - 11/29/20 0907     Clinical Impression Statement Pt is progressing towards goals. He reports his pain is better  overall. Pt is progressing with imrpoving coordination.    OT Occupational Profile and History  Problem Focused Assessment - Including review of records relating to presenting problem    Occupational performance deficits (Please refer to evaluation for details): ADL's;IADL's;Work;Leisure;Play;Rest and Sleep;Social Participation    Body Structure / Function / Physical Skills ADL;Flexibility;ROM;UE functional use;Decreased knowledge of use of DME;FMC;Balance;Dexterity;Gait;Sensation;GMC;Endurance;IADL;Coordination;Strength;Pain    Rehab Potential Good     Clinical Decision Making Several treatment options, min-mod task modification necessary    Comorbidities Affecting Occupational Performance: May have comorbidities impacting occupational performance    Modification or Assistance to Complete Evaluation  Min-Moderate modification of tasks or assist with assess necessary to complete eval    OT Frequency 2x / week   plus eval, anticipate d/c after 8 weeks dependent on progress.   OT Duration 12 weeks    OT Treatment/Interventions Self-care/ADL training;Ultrasound;Energy conservation;Patient/family education;DME and/or AE instruction;Paraffin;Passive range of motion;Balance training;Cryotherapy;Fluidtherapy    Plan start with cane exercise in supine, progress to review of theraband exercises, review median n. glides with right arm supported and elevated, coordination, typing,    Consulted and Agree with Plan of Care Patient             Patient will benefit from skilled therapeutic intervention in order to improve the following deficits and impairments:   Body Structure / Function / Physical Skills: ADL, Flexibility, ROM, UE functional use, Decreased knowledge of use of DME, FMC, Balance, Dexterity, Gait, Sensation, GMC, Endurance, IADL, Coordination, Strength, Pain       Visit Diagnosis: Muscle weakness (generalized)  Unsteadiness on feet  Other lack of coordination  Other disturbances of skin sensation  Abnormal posture  Other symptoms and signs involving the musculoskeletal system    Problem List Patient Active Problem List   Diagnosis Date Noted   Abscess of right foot 08/16/2020   Cellulitis of right foot 08/16/2020   Microhematuria 08/03/2020   Gastroesophageal reflux disease 07/07/2019   External otitis of right ear 03/09/2017   Thumb laceration, right, initial encounter 03/09/2017   Weakness of left side of body 11/19/2016   Stress due to illness of family member 06/10/2016   Syncope 04/23/2016   Onychomycosis  04/23/2016   Impacted ear wax 04/23/2016   Constipation due to opioid therapy 06/13/2014   Chronic pain syndrome 02/21/2014   Allergic reaction 02/20/2014   Neurofibromatosis, type 1 (HCC) 12/14/2013   Status post cervical arthrodesis 11/21/2013   Cervical myelopathy (HCC) 09/21/2013   Preventative health care 06/23/2010   HORNER'S SYNDROME 04/12/2009   Other psoriasis 08/24/2007   Tinea pedis 08/24/2007    Makinze Jani, OT/L 11/29/2020, 9:09 AM  [facility] [address] [place], [place], [address] Phone: [phone]   Fax:  [phone]  Name: Ladarrion Telfair MRN: [id number] Date of Birth: [birth year]

## 2020-12-02 ENCOUNTER — Encounter: Payer: Self-pay | Admitting: Occupational Therapy

## 2020-12-02 ENCOUNTER — Other Ambulatory Visit: Payer: Self-pay

## 2020-12-02 ENCOUNTER — Ambulatory Visit: Payer: Managed Care, Other (non HMO)

## 2020-12-02 ENCOUNTER — Ambulatory Visit: Payer: Managed Care, Other (non HMO) | Admitting: Occupational Therapy

## 2020-12-02 DIAGNOSIS — M6281 Muscle weakness (generalized): Secondary | ICD-10-CM | POA: Diagnosis not present

## 2020-12-02 DIAGNOSIS — R208 Other disturbances of skin sensation: Secondary | ICD-10-CM

## 2020-12-02 DIAGNOSIS — R278 Other lack of coordination: Secondary | ICD-10-CM

## 2020-12-02 DIAGNOSIS — R2681 Unsteadiness on feet: Secondary | ICD-10-CM

## 2020-12-02 DIAGNOSIS — R2689 Other abnormalities of gait and mobility: Secondary | ICD-10-CM

## 2020-12-02 NOTE — Therapy (Signed)
[facility] [address] [place], [place], [address] Phone: [phone]   Fax:  [phone]  Physical Therapy Treatment  Patient Details  Name: Larry Riley MRN: [id number] Date of Birth: [birth year] Referring Provider (PT): Dr. Gaspar Cola   Encounter Date: 12/02/2020   PT End of Session - 12/02/20 1644     Visit Number 12    Number of Visits 17    Date for PT Re-Evaluation 12/03/20    Authorization Type Cign[employer] (60v max combined)    Authorization Time Period 9/27-11/22    Authorization - Visit Number 10    Authorization - Number of Visits 60    Progress Note Due on Visit 10    PT Start Time 1700    PT Stop Time 1740    PT Time Calculation (min) 40 min    Equipment Utilized During Treatment Gait belt    Activity Tolerance Patient tolerated treatment well    Behavior During Therapy [facility] for tasks assessed/performed             Past Medical History:  Diagnosis Date   GERD (gastroesophageal reflux disease) 01/21/2017   MRSA (methicillin resistant Staphylococcus aureus) 2005   leg   Neurofibromatosis    tumor down spine and brain    Past Surgical History:  Procedure Laterality Date   hydrocephalus     [place]unt 1999[place]   NECK SURGERY      tumor removal   SPINE SURGERY  10/2013   c2-t2 fusion  , laminectomy c1-6-- Dr Rigoberto Noel-- [place]   VASECTOMY  02/2013    There were no vitals filed for this visit.   Subjective Assessment - 12/02/20 1745     Subjective Low back pain minimal    Pertinent History C2-T2 cervical fusion, neurofibromatosis, L foot drop    How long can you sit comfortably? >30 min    How long can you stand comfortably? >15 min    How long can you walk comfortably? >15 min    Patient Stated Goals Walk at least 1 mile, to get in and out of chair easier without using arms too much,    Pain Onset More than a month ago    Pain Onset More than a month ago                [facility] PT  Assessment - 12/02/20 0001       Functional Gait  Assessment   Gait assessed  Yes    Gait Level Surface Walks 20 ft in less than 7 sec but greater than 5.5 sec, uses assistive device, slower speed, mild gait deviations, or deviates 6-10 in outside of the 12 in walkway width.    Change in Gait Speed Able to change speed, demonstrates mild gait deviations, deviates 6-10 in outside of the 12 in walkway width, or no gait deviations, unable to achieve a major change in velocity, or uses a change in velocity, or uses an assistive device.    Gait with Horizontal Head Turns Performs head turns smoothly with slight change in gait velocity (eg, minor disruption to smooth gait path), deviates 6-10 in outside 12 in walkway width, or uses an assistive device.    Gait with Vertical Head Turns Performs task with slight change in gait velocity (eg, minor disruption to smooth gait path), deviates 6 - 10 in outside 12 in walkway width or uses assistive device    Gait and Pivot Turn Pivot turns safely within 3 sec  and stops quickly with no loss of balance.    Step Over Obstacle Is able to step over one shoe box (4.5 in total height) without changing gait speed. No evidence of imbalance.    Gait with Narrow Base of Support Ambulates less than 4 steps heel to toe or cannot perform without assistance.    Gait with Eyes Closed Walks 20 ft, slow speed, abnormal gait pattern, evidence for imbalance, deviates 10-15 in outside 12 in walkway width. Requires more than 9 sec to ambulate 20 ft.    Ambulating Backwards Walks 20 ft, uses assistive device, slower speed, mild gait deviations, deviates 6-10 in outside 12 in walkway width.    Steps Alternating feet, must use rail.    Total Score 18                           [facility] Adult PT Treatment/Exercise - 12/02/20 0001       Transfers   Transfers Sit to Stand;Stand to Sit;Stand Pivot Transfers    Sit to Stand 6: Modified independent (Device/Increase time)     Five time sit to stand comments  10.6    Stand to Sit 6: Modified independent (Device/Increase time)    Stand Pivot Transfers 6: Modified independent (Device/Increase time)      Knee/Hip Exercises: Aerobic   Tread Mill 0%, 1.5 mph, baseline HR 116, post task HR 108, 12' duration with need of B UE support, 1 min w/u and cooldown at 0.8 mph      Knee/Hip Exercises: Seated   Long Arc Quad Strengthening;Both;1 set;15 reps;Weights;Limitations    Long Arc Quad Weight 3 lbs.    Long [employer] Limitations with lat press alternating, weight on R only    Marching Strengthening;Both;1 set;15 reps;Weights;Limitations    Marching Limitations alt with lat press, 3# on R                       PT Short Term Goals - 11/19/20 0807       PT SHORT TERM GOAL #1   Title Patient will be able to ambulate 1050' without AD on level ground with SBA to improve short community ambulation    Baseline met 11/19/20    Time 4    Period Weeks    Status Achieved    Target Date 11/05/20      PT SHORT TERM GOAL #2   Title Pt will demo 5x sit to stand score <20 seconds without HHA to improve functional strength in bil LE    Baseline 11.41 secs without UE support 11/19/20    Time 4    Period Weeks    Status Achieved    Target Date 11/05/20      PT SHORT TERM GOAL #3   Title Patient will be able to ambulate 400 feet on grass without AD and SBA to improve safe community negotiation    Baseline met 11/19/20    Time 4    Period Weeks    Status Achieved    Target Date 11/05/20               PT Long Term Goals - 12/02/20 1656       PT LONG TERM GOAL #1   Title Pt will demo <12 seconds with 5x sit to stand without HHA to improve functional strength in bil LE; 12/02/20 5x STS in <10s    Baseline 11.41 11/19/20; 12/02/20 5x  STS 10.6s    Time 8    Period Weeks    Status Revised      PT LONG TERM GOAL #2   Title Pt will be able to ambulate 25 min without AD with walking program at home to improve  walking endurance to access community    Baseline not attempted, walking program issued (10/08/20); 12/02/20 Has been walking 25-30 min at home with wife    Time 8    Period Weeks    Status Achieved      PT LONG TERM GOAL #3   Title Patient will demo >20/30 on functional gait assessment to improve functional balance and gait    Baseline 9/30 (10/08/20); 12/02/20 FGA 18/30    Time 8    Period Weeks    Status On-going      PT LONG TERM GOAL #4   Title Patient to perform floor transfer with Mod I    Baseline TBD    Time 4    Period Weeks    Status New    Target Date 01/06/21                   Plan - 12/02/20 1644     Clinical Impression Statement Goals were assessed for progress as patient will be recertified to continue OPPT due to outstanding goals as well as patient not reaching maxlmal rehab potential.  FGA score improved and 5x STS test time revised to reflect potential.  Added floor transfers to goals    Personal Factors and Comorbidities Comorbidity 3+    Comorbidities Neurofibromatosis, hx of cervical fusions, chronic neck pain, back pain, carpal tunnel syndrome, L foot drop    Examination-Activity Limitations Bend;Carry;Dressing;Lift;Reach Overhead;Sit;Sleep;Squat;Stairs;Stand;Transfers;Bathing    Examination-Participation Restrictions Cleaning;Community Activity;Driving;Laundry;Meal Prep;Occupation;Shop;Yard Work    [employer] Evolving/Moderate complexity    Rehab Potential Good    PT Frequency 2x / week    PT Duration 8 weeks    PT Treatment/Interventions ADLs/Self Care Home Management;Cryotherapy;Electrical Stimulation;Moist Heat;Traction;Gait training;Stair training;Functional mobility training;Therapeutic activities;Therapeutic exercise;Balance training;Neuromuscular re-education;Patient/family education;Orthotic Fit/Training;Manual techniques;Scar mobilization;Passive range of motion;Dry needling;Energy conservation;Spinal  Manipulations;Joint Manipulations    PT Next Visit Plan Continue high level balance, resisted gait with weight on LLE, treadmill training uphill, core as needed, reactionary strategies    PT Home Exercise Plan Walking program, [id number]    Consulted and Agree with Plan of Care Patient             Patient will benefit from skilled therapeutic intervention in order to improve the following deficits and impairments:  Abnormal gait, Decreased activity tolerance, Decreased balance, Decreased range of motion, Decreased mobility, Decreased endurance, Decreased skin integrity, Decreased strength, Difficulty walking, Increased fascial restricitons, Increased muscle spasms, Impaired flexibility, Impaired sensation, Impaired tone, Impaired UE functional use, Improper body mechanics, Postural dysfunction, Pain  Visit Diagnosis: Muscle weakness (generalized)  Unsteadiness on feet  Other abnormalities of gait and mobility     Problem List Patient Active Problem List   Diagnosis Date Noted   Abscess of right foot 08/16/2020   Cellulitis of right foot 08/16/2020   Microhematuria 08/03/2020   Gastroesophageal reflux disease 07/07/2019   External otitis of right ear 03/09/2017   Thumb laceration, right, initial encounter 03/09/2017   Weakness of left side of body 11/19/2016   Stress due to illness of family member 06/10/2016   Syncope 04/23/2016   Onychomycosis 04/23/2016   Impacted ear wax 04/23/2016   Constipation due to opioid therapy 06/13/2014   Chronic  pain syndrome 02/21/2014   Allergic reaction 02/20/2014   Neurofibromatosis, type 1 ([place]) 12/14/2013   Status post cervical arthrodesis 11/21/2013   Cervical myelopathy ([place]) 09/21/2013   Preventative health care 06/23/2010   HORNER'S SYNDROME 04/12/2009   Other psoriasis 08/24/2007   Tinea pedis 08/24/2007    Lanice Shirts, PT 12/02/2020, 5:47 PM  [facility] [address] [place], [place], [address] Phone: [phone]   Fax:  [phone]  Name: Larry Riley MRN: [id number] Date of Birth: [birth year]

## 2020-12-02 NOTE — Therapy (Signed)
[facility] [address] [place], [place], [address] Phone: [phone]   Fax:  [phone]  Occupational Therapy Treatment  Patient Details  Name: Larry Riley MRN: [id number] Date of Birth: 16-Nov-1980 Referring Provider (OT): Dr. Raynald Kemp   Encounter Date: 12/02/2020   OT End of Session - 12/02/20 1619     Visit Number 14    Number of Visits 25    Date for OT Re-Evaluation 01/01/21    Authorization Type cigna    Authorization - Visit Number 14    Authorization - Number of Visits 30    OT Start Time 1616    OT Stop Time 1700    OT Time Calculation (min) 44 min    Activity Tolerance Patient tolerated treatment well    Behavior During Therapy [facility] for tasks assessed/performed             Past Medical History:  Diagnosis Date   GERD (gastroesophageal reflux disease) 01/21/2017   MRSA (methicillin resistant Staphylococcus aureus) 2005   leg   Neurofibromatosis    tumor down spine and brain    Past Surgical History:  Procedure Laterality Date   hydrocephalus     [place]unt 1999[place]   NECK SURGERY      tumor removal   SPINE SURGERY  10/2013   c2-t2 fusion  , laminectomy c1-6-- Dr Raynald Kemp-- [facility]   VASECTOMY  02/2013    There were no vitals filed for this visit.   Subjective Assessment - 12/02/20 1618     Subjective  "for some reason i have had a knot in my shoulder - neck and shoulder"    Pertinent History Larry Riley is a 40 y.o. 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 Raynald Kemp. PMH: Neurofibromatosis, type 1,   debulking of cervical spine tumor/Cervical athrodesis, Horners Syndrome, and hydrocephalus, numerous cervical surgeries over the years (6-7    Limitations no restrictions per MD now, progress at pt's own comfort level    Currently in Pain? Yes    Pain Score 7     Pain Location Neck    Pain Orientation Left    Pain Descriptors / Indicators Aching;Tightness     Pain Type Acute pain;Chronic pain    Pain Onset More than a month ago    Pain Frequency Constant              Medium Pegs with RUE with mod drops at first but increased skill as activity progressed. Pt copied pattern with no difficulty.  Reviewed Median Nerve Glides.  Theraband pt reports not doing them recently d/t pain in his neck.   Arm Bike: for conditioning and reciprocal movements on Level 1 for 6 minutes (forward and backward)                   OT Short Term Goals - 11/29/20 0742       OT SHORT TERM GOAL #1   Title Pt will be I with HEP -11/20/20    Time 6    Period Weeks    Status Achieved    Target Date 11/20/20      OT SHORT TERM GOAL #2   Title Pt will demonstrate improved coordination for ADLs as evidenced by placing 9 pegs in 2 mins  or less for RUE    Baseline 6 pegs placed in 2 mins    Time 6    Period Weeks  Status Achieved   1 min 38 secs     OT SHORT TERM GOAL #3   Title Pt will perform bathing and dressing with min A    Time 6    Period Weeks    Status Achieved      OT SHORT TERM GOAL #4   Title Pt will demonstrate understaning of adapted strategies/ AE to assist with ADLS (such as feeding, cutting food, using computer mouse.)    Time 6    Period Weeks    Status On-going      OT SHORT TERM GOAL #5   Title Pt will demonstrate improved coordination for ADLs as evidenced by decreasing 9 hole peg test to 50 secs or less for LUE    Baseline LUE 55.72 secs    Time 6    Period Weeks    Status Achieved   50.82, 40.19     OT SHORT TERM GOAL #6   Title Pt will demonstrate improved functional use of RUE as evidenced by increasing box/ blocks score to 37 blocks or greater.    Time 6    Period Weeks    Status On-going   31 blocks              OT Long Term Goals - 11/26/20 0730       OT LONG TERM GOAL #1   Title Pt will be I with upgraded HEP prn-01/02/20    Time 12    Period Weeks    Status On-going      OT LONG  TERM GOAL #2   Title Pt will demonstrate improved coordination for ADLs as evidenced by completing 9 hole peg test in 90 secs or less with RUE.    Baseline 6 pegs placed in 2 mins    Time 12    Period Weeks    Status On-going      OT LONG TERM GOAL #3   Title Pt will perfrorm all basic ADLs modified indpendently    Time 12    Period Weeks    Status On-going      OT LONG TERM GOAL #4   Title Patient will be able to type with both hands within reasonable period of time in preparation for work activities.    Time 12    Period Weeks    Status On-going      OT LONG TERM GOAL #5   Title Pt will perform basic home management and cooking modified independently    Time 12    Period Weeks    Status New      OT LONG TERM GOAL #6   Title Pt will improve ability to fasten buttons as shown by being able to fasten/unfasten 3 buttons in less than 60 secs or less    Time 12    Period Weeks    Status New                   Plan - 12/02/20 1637     Clinical Impression Statement Pt continues to progress towards goals. Pt with increased pain in Left side of neck today.    OT Occupational Profile and History Problem Focused Assessment - Including review of records relating to presenting problem    Occupational performance deficits (Please refer to evaluation for details): ADL's;IADL's;Work;Leisure;Play;Rest and Sleep;Social Participation    Body Structure / Function / Physical Skills ADL;Flexibility;ROM;UE functional use;Decreased knowledge of use of DME;FMC;Balance;Dexterity;Gait;Sensation;GMC;Endurance;IADL;Coordination;Strength;Pain    Rehab Potential Good  Clinical Decision Making Several treatment options, min-mod task modification necessary    Comorbidities Affecting Occupational Performance: May have comorbidities impacting occupational performance    Modification or Assistance to Complete Evaluation  Min-Moderate modification of tasks or assist with assess necessary to complete  eval    OT Frequency 2x / week   plus eval, anticipate d/c after 8 weeks dependent on progress.   OT Duration 12 weeks    OT Treatment/Interventions Self-care/ADL training;Ultrasound;Energy conservation;Patient/family education;DME and/or AE instruction;Paraffin;Passive range of motion;Balance training;Cryotherapy;Fluidtherapy    Plan start with cane exercise in supine, progress to review of theraband exercises, review median n. glides with right arm supported and elevated, coordination, typing,    Consulted and Agree with Plan of Care Patient             Patient will benefit from skilled therapeutic intervention in order to improve the following deficits and impairments:   Body Structure / Function / Physical Skills: ADL, Flexibility, ROM, UE functional use, Decreased knowledge of use of DME, FMC, Balance, Dexterity, Gait, Sensation, GMC, Endurance, IADL, Coordination, Strength, Pain       Visit Diagnosis: Muscle weakness (generalized)  Unsteadiness on feet  Other abnormalities of gait and mobility  Other lack of coordination  Other disturbances of skin sensation    Problem List Patient Active Problem List   Diagnosis Date Noted   Abscess of right foot 08/16/2020   Cellulitis of right foot 08/16/2020   Microhematuria 08/03/2020   Gastroesophageal reflux disease 07/07/2019   External otitis of right ear 03/09/2017   Thumb laceration, right, initial encounter 03/09/2017   Weakness of left side of body 11/19/2016   Stress due to illness of family member 06/10/2016   Syncope 04/23/2016   Onychomycosis 04/23/2016   Impacted ear wax 04/23/2016   Constipation due to opioid therapy 06/13/2014   Chronic pain syndrome 02/21/2014   Allergic reaction 02/20/2014   Neurofibromatosis, type 1 (HCC) 12/14/2013   Status post cervical arthrodesis 11/21/2013   Cervical myelopathy (HCC) 09/21/2013   Preventative health care 06/23/2010   HORNER'S SYNDROME 04/12/2009   Other psoriasis  08/24/2007   Tinea pedis 08/24/2007    Junious Dresser, OT/L 12/02/2020, 5:01 PM  [facility] [address] [place], [place], [address] Phone: [phone]   Fax:  [phone]  Name: Larry Riley MRN: [id number] Date of Birth: [birth year]

## 2020-12-04 ENCOUNTER — Ambulatory Visit: Payer: Managed Care, Other (non HMO)

## 2020-12-04 ENCOUNTER — Encounter: Payer: Self-pay | Admitting: Occupational Therapy

## 2020-12-04 ENCOUNTER — Other Ambulatory Visit: Payer: Self-pay

## 2020-12-04 ENCOUNTER — Ambulatory Visit: Payer: Managed Care, Other (non HMO) | Admitting: Occupational Therapy

## 2020-12-04 DIAGNOSIS — R2681 Unsteadiness on feet: Secondary | ICD-10-CM

## 2020-12-04 DIAGNOSIS — R208 Other disturbances of skin sensation: Secondary | ICD-10-CM

## 2020-12-04 DIAGNOSIS — R278 Other lack of coordination: Secondary | ICD-10-CM

## 2020-12-04 DIAGNOSIS — M6281 Muscle weakness (generalized): Secondary | ICD-10-CM | POA: Diagnosis not present

## 2020-12-04 DIAGNOSIS — R2689 Other abnormalities of gait and mobility: Secondary | ICD-10-CM

## 2020-12-04 NOTE — Therapy (Signed)
[facility] [address] [place], [place], [address] Phone: [phone]   Fax:  [phone]  Physical Therapy Treatment  Patient Details  Name: Larry Riley MRN: [id number] Date of Birth: 1980/07/16 Referring Provider (PT): Dr. Gaspar Cola   Encounter Date: 12/04/2020   PT End of Session - 12/04/20 1007     Visit Number 13    Number of Visits 20    Date for PT Re-Evaluation 12/03/20    Authorization Type C[employer] (60v max combined)    Authorization Time Period 11/22-12/26    Authorization - Number of Visits 20    Progress Note Due on Visit 20    PT Start Time 1015    PT Stop Time 1100    PT Time Calculation (min) 45 min    Equipment Utilized During Treatment Gait belt    Activity Tolerance Patient tolerated treatment well    Behavior During Therapy [facility] for tasks assessed/performed             Past Medical History:  Diagnosis Date   GERD (gastroesophageal reflux disease) 01/21/2017   MRSA (methicillin resistant Staphylococcus aureus) 2005   leg   Neurofibromatosis    tumor down spine and brain    Past Surgical History:  Procedure Laterality Date   hydrocephalus     [place]unt 1999[place]   NECK SURGERY      tumor removal   SPINE SURGERY  10/2013   c2-t2 fusion  , laminectomy c1-6-- Dr Rigoberto Noel-- [place]   VASECTOMY  02/2013    There were no vitals filed for this visit.   Subjective Assessment - 12/04/20 1017     Subjective Reports some low back an cervical pain elevated as he has resumed stretching since ROM restrictions lifted by surgeon    Pertinent History C2-T2 cervical fusion, neurofibromatosis, L foot drop    How long can you sit comfortably? >30 min    How long can you stand comfortably? >15 min    How long can you walk comfortably? >15 min    Patient Stated Goals Walk at least 1 mile, to get in and out of chair easier without using arms too much,    Pain Onset More than a month ago    Pain Onset  More than a month ago                               [facility] Adult PT Treatment/Exercise - 12/04/20 0001       Transfers   Transfers Sit to Stand;Stand to Sit;Stand Pivot Transfers    Sit to Stand 6: Modified independent (Device/Increase time)    Stand to Sit 6: Modified independent (Device/Increase time)    Stand Pivot Transfers 6: Modified independent (Device/Increase time)      Ambulation/Gait   Ambulation/Gait Yes    Ambulation/Gait Assistance 6: Modified independent (Device/Increase time)    Ambulation/Gait Assistance Details with self toss catch    Ambulation Distance (Feet) 250 Feet    Assistive device None    Gait Pattern Step-through pattern;Decreased hip/knee flexion - right    Ambulation Surface Level;Indoor    Gait Comments see treadmill info      Knee/Hip Exercises: Aerobic   Tread Mill 0%, 1.4 mph, 12' duration with need of B UE support, 1 min w/u and cooldown at 0.8 mph, 6# on LLE  Balance Exercises - 12/04/20 0001       Balance Exercises: Standing   Stepping Strategy Anterior;Lateral;Foam/compliant surface;10 reps;Limitations    Stepping Strategy Limitations from airex onto 6" block    Step Over Hurdles / Cones floor ladder w/o UE support, CGA, 2 trips forward, 2 trips bwd 2 feet per square and repeate with 1 foot per square.  Sideatepping 2 feet per square for 2 trips back/forth                  PT Short Term Goals - 11/19/20 0807       PT SHORT TERM GOAL #1   Title Patient will be able to ambulate 1050' without AD on level ground with SBA to improve short community ambulation    Baseline met 11/19/20    Time 4    Period Weeks    Status Achieved    Target Date 11/05/20      PT SHORT TERM GOAL #2   Title Pt will demo 5x sit to stand score <20 seconds without HHA to improve functional strength in bil LE    Baseline 11.41 secs without UE support 11/19/20    Time 4    Period Weeks    Status Achieved     Target Date 11/05/20      PT SHORT TERM GOAL #3   Title Patient will be able to ambulate 400 feet on grass without AD and SBA to improve safe community negotiation    Baseline met 11/19/20    Time 4    Period Weeks    Status Achieved    Target Date 11/05/20               PT Long Term Goals - 12/02/20 1656       PT LONG TERM GOAL #1   Title Pt will demo <12 seconds with 5x sit to stand without HHA to improve functional strength in bil LE; 12/02/20 5x STS in <10s    Baseline 11.41 11/19/20; 12/02/20 5x STS 10.6s    Time 8    Period Weeks    Status Revised      PT LONG TERM GOAL #2   Title Pt will be able to ambulate 25 min without AD with walking program at home to improve walking endurance to access community    Baseline not attempted, walking program issued (10/08/20); 12/02/20 Has been walking 25-30 min at home with wife    Time 8    Period Weeks    Status Achieved      PT LONG TERM GOAL #3   Title Patient will demo >20/30 on functional gait assessment to improve functional balance and gait    Baseline 9/30 (10/08/20); 12/02/20 FGA 18/30    Time 8    Period Weeks    Status On-going      PT LONG TERM GOAL #4   Title Patient to perform floor transfer with Mod I    Baseline TBD    Time 4    Period Weeks    Status New    Target Date 01/06/21                   Plan - 12/04/20 1058     Clinical Impression Statement Added 6# weight to LLE for treadmill walking, incresaed difficulty of floor ladder with single foot placement, ball self toss.catch for 235f and continued reactionary stepping from airex onto 6" step    Personal Factors and Comorbidities Comorbidity  3+    Comorbidities Neurofibromatosis, hx of cervical fusions, chronic neck pain, back pain, carpal tunnel syndrome, L foot drop    Examination-Activity Limitations Bend;Carry;Dressing;Lift;Reach Overhead;Sit;Sleep;Squat;Stairs;Stand;Transfers;Bathing    Examination-Participation Restrictions  Cleaning;Community Activity;Driving;Laundry;Meal Prep;Occupation;Shop;Yard Work    [employer] Evolving/Moderate complexity    Rehab Potential Good    PT Frequency 2x / week    PT Duration 4 weeks    PT Treatment/Interventions ADLs/Self Care Home Management;Cryotherapy;Electrical Stimulation;Moist Heat;Traction;Gait training;Stair training;Functional mobility training;Therapeutic activities;Therapeutic exercise;Balance training;Neuromuscular re-education;Patient/family education;Orthotic Fit/Training;Manual techniques;Scar mobilization;Passive range of motion;Dry needling;Energy conservation;Spinal Manipulations;Joint Manipulations    PT Next Visit Plan Continue high level balance, resisted gait with weight on LLE, treadmill training uphill, core as needed, reactionary strategies/stepping focus on step accuracy    PT Home Exercise Plan Walking program, [id number]    Consulted and Agree with Plan of Care Patient             Patient will benefit from skilled therapeutic intervention in order to improve the following deficits and impairments:  Abnormal gait, Decreased activity tolerance, Decreased balance, Decreased range of motion, Decreased mobility, Decreased endurance, Decreased skin integrity, Decreased strength, Difficulty walking, Increased fascial restricitons, Increased muscle spasms, Impaired flexibility, Impaired sensation, Impaired tone, Impaired UE functional use, Improper body mechanics, Postural dysfunction, Pain  Visit Diagnosis: Muscle weakness (generalized)  Unsteadiness on feet  Other abnormalities of gait and mobility  Other disturbances of skin sensation     Problem List Patient Active Problem List   Diagnosis Date Noted   Abscess of right foot 08/16/2020   Cellulitis of right foot 08/16/2020   Microhematuria 08/03/2020   Gastroesophageal reflux disease 07/07/2019   External otitis of right ear 03/09/2017   Thumb laceration, right, initial  encounter 03/09/2017   Weakness of left side of body 11/19/2016   Stress due to illness of family member 06/10/2016   Syncope 04/23/2016   Onychomycosis 04/23/2016   Impacted ear wax 04/23/2016   Constipation due to opioid therapy 06/13/2014   Chronic pain syndrome 02/21/2014   Allergic reaction 02/20/2014   Neurofibromatosis, type 1 ([place]) 12/14/2013   Status post cervical arthrodesis 11/21/2013   Cervical myelopathy ([place]) 09/21/2013   Preventative health care 06/23/2010   HORNER'S SYNDROME 04/12/2009   Other psoriasis 08/24/2007   Tinea pedis 08/24/2007    Lanice Shirts, PT 12/04/2020, 11:00 AM  [facility] [address] [place], [place], [address] Phone: [phone]   Fax:  [phone]  Name: Corion Sherrod MRN: [id number] Date of Birth: 08/29/1980

## 2020-12-04 NOTE — Therapy (Signed)
[facility] [address] [place], [place], [address] Phone: [phone]   Fax:  [phone]  Occupational Therapy Treatment  Patient Details  Name: Larry Riley MRN: [id number] Date of Birth: [birth year] Referring Provider (OT): Dr. Raynald Kemp   Encounter Date: 12/04/2020   OT End of Session - 12/04/20 0935     Visit Number 15    Number of Visits 25    Date for OT Re-Evaluation 01/01/21    Authorization Type cigna    Authorization - Visit Number 15    Authorization - Number of Visits 30    OT Start Time 0933    OT Stop Time 1015    OT Time Calculation (min) 42 min    Activity Tolerance Patient tolerated treatment well    Behavior During Therapy [facility] for tasks assessed/performed             Past Medical History:  Diagnosis Date   GERD (gastroesophageal reflux disease) 01/21/2017   MRSA (methicillin resistant Staphylococcus aureus) 2005   leg   Neurofibromatosis    tumor down spine and brain    Past Surgical History:  Procedure Laterality Date   hydrocephalus     [place]unt 1999[place]   NECK SURGERY      tumor removal   SPINE SURGERY  10/2013   c2-t2 fusion  , laminectomy c1-6-- Dr Raynald Kemp-- [place]   VASECTOMY  02/2013    There were no vitals filed for this visit.   Subjective Assessment - 12/04/20 0934     Subjective  "i had to switch my calls around today." Pt denies any changes. "Pain levels were real bad when I woke up this morning but since I took my meds it's down to about 3 or 4."    Pertinent History Larry Riley is a 40 y.o. 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 Raynald Kemp. PMH: Neurofibromatosis, type 1,   debulking of cervical spine tumor/Cervical athrodesis, Horners Syndrome, and hydrocephalus, numerous cervical surgeries over the years (6-7    Limitations no restrictions per MD now, progress at pt's own comfort level    Currently in Pain? Yes    Pain Score 4      Pain Location Neck    Pain Orientation Left    Pain Descriptors / Indicators Aching;Tightness    Pain Type Chronic pain    Pain Onset More than a month ago    Pain Frequency Constant             ADLs pt reports feeding and cutting up food is getting better but thinks he will order a rocker knife d/t inconsistent coordination and incresing independence and safety..  Reviewed Supine Cane Exercises.  Semi Circle Pegboard w RUE - min drops and difficulty.  Grooved Pegs  with RUE with mod/max difficulty and drops but increased coordination from previous times.                     OT Short Term Goals - 11/29/20 0742       OT SHORT TERM GOAL #1   Title Pt will be I with HEP -11/20/20    Time 6    Period Weeks    Status Achieved    Target Date 11/20/20      OT SHORT TERM GOAL #2   Title Pt will demonstrate improved coordination for ADLs as evidenced by placing 9 pegs in 2 mins  or less  for RUE    Baseline 6 pegs placed in 2 mins    Time 6    Period Weeks    Status Achieved   1 min 38 secs     OT SHORT TERM GOAL #3   Title Pt will perform bathing and dressing with min A    Time 6    Period Weeks    Status Achieved      OT SHORT TERM GOAL #4   Title Pt will demonstrate understaning of adapted strategies/ AE to assist with ADLS (such as feeding, cutting food, using computer mouse.)    Time 6    Period Weeks    Status On-going      OT SHORT TERM GOAL #5   Title Pt will demonstrate improved coordination for ADLs as evidenced by decreasing 9 hole peg test to 50 secs or less for LUE    Baseline LUE 55.72 secs    Time 6    Period Weeks    Status Achieved   50.82, 40.19     OT SHORT TERM GOAL #6   Title Pt will demonstrate improved functional use of RUE as evidenced by increasing box/ blocks score to 37 blocks or greater.    Time 6    Period Weeks    Status On-going   31 blocks              OT Long Term Goals - 11/26/20 0730       OT LONG  TERM GOAL #1   Title Pt will be I with upgraded HEP prn-01/02/20    Time 12    Period Weeks    Status On-going      OT LONG TERM GOAL #2   Title Pt will demonstrate improved coordination for ADLs as evidenced by completing 9 hole peg test in 90 secs or less with RUE.    Baseline 6 pegs placed in 2 mins    Time 12    Period Weeks    Status On-going      OT LONG TERM GOAL #3   Title Pt will perfrorm all basic ADLs modified indpendently    Time 12    Period Weeks    Status On-going      OT LONG TERM GOAL #4   Title Patient will be able to type with both hands within reasonable period of time in preparation for work activities.    Time 12    Period Weeks    Status On-going      OT LONG TERM GOAL #5   Title Pt will perform basic home management and cooking modified independently    Time 12    Period Weeks    Status New      OT LONG TERM GOAL #6   Title Pt will improve ability to fasten buttons as shown by being able to fasten/unfasten 3 buttons in less than 60 secs or less    Time 12    Period Weeks    Status New                   Plan - 12/04/20 6644     Clinical Impression Statement Pt with good recall of cane exercises. Encouraged to do more at home.    OT Occupational Profile and History Problem Focused Assessment - Including review of records relating to presenting problem    Occupational performance deficits (Please refer to evaluation for details): ADL's;IADL's;Work;Leisure;Play;Rest and Sleep;Social Participation  Body Structure / Function / Physical Skills ADL;Flexibility;ROM;UE functional use;Decreased knowledge of use of DME;FMC;Balance;Dexterity;Gait;Sensation;GMC;Endurance;IADL;Coordination;Strength;Pain    Rehab Potential Good    Clinical Decision Making Several treatment options, min-mod task modification necessary    Comorbidities Affecting Occupational Performance: May have comorbidities impacting occupational performance    Modification or  Assistance to Complete Evaluation  Min-Moderate modification of tasks or assist with assess necessary to complete eval    OT Frequency 2x / week   plus eval, anticipate d/c after 8 weeks dependent on progress.   OT Duration 12 weeks    OT Treatment/Interventions Self-care/ADL training;Ultrasound;Energy conservation;Patient/family education;DME and/or AE instruction;Paraffin;Passive range of motion;Balance training;Cryotherapy;Fluidtherapy    Plan start with cane exercise in supine, progress to review of theraband exercises, review median n. glides with right arm supported and elevated, coordination, typing,    Consulted and Agree with Plan of Care Patient             Patient will benefit from skilled therapeutic intervention in order to improve the following deficits and impairments:   Body Structure / Function / Physical Skills: ADL, Flexibility, ROM, UE functional use, Decreased knowledge of use of DME, FMC, Balance, Dexterity, Gait, Sensation, GMC, Endurance, IADL, Coordination, Strength, Pain       Visit Diagnosis: Muscle weakness (generalized)  Unsteadiness on feet  Other abnormalities of gait and mobility  Other lack of coordination  Other disturbances of skin sensation    Problem List Patient Active Problem List   Diagnosis Date Noted   Abscess of right foot 08/16/2020   Cellulitis of right foot 08/16/2020   Microhematuria 08/03/2020   Gastroesophageal reflux disease 07/07/2019   External otitis of right ear 03/09/2017   Thumb laceration, right, initial encounter 03/09/2017   Weakness of left side of body 11/19/2016   Stress due to illness of family member 06/10/2016   Syncope 04/23/2016   Onychomycosis 04/23/2016   Impacted ear wax 04/23/2016   Constipation due to opioid therapy 06/13/2014   Chronic pain syndrome 02/21/2014   Allergic reaction 02/20/2014   Neurofibromatosis, type 1 (HCC) 12/14/2013   Status post cervical arthrodesis 11/21/2013   Cervical  myelopathy (HCC) 09/21/2013   Preventative health care 06/23/2010   HORNER'S SYNDROME 04/12/2009   Other psoriasis 08/24/2007   Tinea pedis 08/24/2007    Junious Dresser, OT/L 12/04/2020, 9:55 AM  [facility] [address] [place], [place], [address] Phone: [phone]   Fax:  [phone]  Name: Hershal Eriksson MRN: [id number] Date of Birth: 1980-06-28

## 2020-12-10 ENCOUNTER — Other Ambulatory Visit: Payer: Self-pay

## 2020-12-10 ENCOUNTER — Encounter: Payer: Self-pay | Admitting: Occupational Therapy

## 2020-12-10 ENCOUNTER — Ambulatory Visit: Payer: Managed Care, Other (non HMO) | Admitting: Occupational Therapy

## 2020-12-10 ENCOUNTER — Ambulatory Visit: Payer: Managed Care, Other (non HMO)

## 2020-12-10 DIAGNOSIS — R2689 Other abnormalities of gait and mobility: Secondary | ICD-10-CM

## 2020-12-10 DIAGNOSIS — R278 Other lack of coordination: Secondary | ICD-10-CM

## 2020-12-10 DIAGNOSIS — M6281 Muscle weakness (generalized): Secondary | ICD-10-CM

## 2020-12-10 DIAGNOSIS — R2681 Unsteadiness on feet: Secondary | ICD-10-CM

## 2020-12-10 DIAGNOSIS — R208 Other disturbances of skin sensation: Secondary | ICD-10-CM

## 2020-12-10 DIAGNOSIS — M542 Cervicalgia: Secondary | ICD-10-CM

## 2020-12-10 NOTE — Therapy (Signed)
[facility] [address] [place], [place], [address] Phone: [phone]   Fax:  [phone]  Occupational Therapy Treatment  Patient Details  Name: Larry Riley MRN: [id number] Date of Birth: [birth year] Referring Provider (OT): Dr. Raynald Kemp   Encounter Date: 12/10/2020   OT End of Session - 12/10/20 1619     Visit Number 16    Number of Visits 25    Date for OT Re-Evaluation 01/01/21    Authorization Type cigna    Authorization - Visit Number 16    Authorization - Number of Visits 30    OT Start Time 1615    OT Stop Time 1657    OT Time Calculation (min) 42 min    Activity Tolerance Patient tolerated treatment well    Behavior During Therapy [facility] for tasks assessed/performed             Past Medical History:  Diagnosis Date   GERD (gastroesophageal reflux disease) 01/21/2017   MRSA (methicillin resistant Staphylococcus aureus) 2005   leg   Neurofibromatosis    tumor down spine and brain    Past Surgical History:  Procedure Laterality Date   hydrocephalus     [place]unt 1999[place]   NECK SURGERY      tumor removal   SPINE SURGERY  10/2013   c2-t2 fusion  , laminectomy c1-6-- Dr Raynald Kemp-- [place]   VASECTOMY  02/2013    There were no vitals filed for this visit.   Subjective Assessment - 12/10/20 1618     Subjective  "my nerves are really bothering me today"    Pertinent History Larry Riley is a 40 y.o. 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 Raynald Kemp. PMH: Neurofibromatosis, type 1,   debulking of cervical spine tumor/Cervical athrodesis, Horners Syndrome, and hydrocephalus, numerous cervical surgeries over the years (6-7    Limitations no restrictions per MD now, progress at pt's own comfort level    Currently in Pain? Yes    Pain Score 6     Pain Location Hand    Pain Orientation Right    Pain Descriptors / Indicators Aching;Sore    Pain Type Chronic pain     Pain Onset More than a month ago    Pain Frequency Constant             Small Pegs with RUE with mod/max difficulty and drops. Pt completed with copying pattern with no cues  Hand Gripper: with RUE on level 2 with black spring. Pt picked up 1 inch blocks with gripper with min drops and min difficulty.  Arm Bike: for conditioning and reciprocal movements on Level 2 for 6 minutes (forward and backward)                      OT Short Term Goals - 11/29/20 0742       OT SHORT TERM GOAL #1   Title Pt will be I with HEP -11/20/20    Time 6    Period Weeks    Status Achieved    Target Date 11/20/20      OT SHORT TERM GOAL #2   Title Pt will demonstrate improved coordination for ADLs as evidenced by placing 9 pegs in 2 mins  or less for RUE    Baseline 6 pegs placed in 2 mins    Time 6    Period Weeks    Status Achieved  1 min 38 secs     OT SHORT TERM GOAL #3   Title Pt will perform bathing and dressing with min A    Time 6    Period Weeks    Status Achieved      OT SHORT TERM GOAL #4   Title Pt will demonstrate understaning of adapted strategies/ AE to assist with ADLS (such as feeding, cutting food, using computer mouse.)    Time 6    Period Weeks    Status On-going      OT SHORT TERM GOAL #5   Title Pt will demonstrate improved coordination for ADLs as evidenced by decreasing 9 hole peg test to 50 secs or less for LUE    Baseline LUE 55.72 secs    Time 6    Period Weeks    Status Achieved   50.82, 40.19     OT SHORT TERM GOAL #6   Title Pt will demonstrate improved functional use of RUE as evidenced by increasing box/ blocks score to 37 blocks or greater.    Time 6    Period Weeks    Status On-going   31 blocks              OT Long Term Goals - 11/26/20 0730       OT LONG TERM GOAL #1   Title Pt will be I with upgraded HEP prn-01/02/20    Time 12    Period Weeks    Status On-going      OT LONG TERM GOAL #2   Title Pt will  demonstrate improved coordination for ADLs as evidenced by completing 9 hole peg test in 90 secs or less with RUE.    Baseline 6 pegs placed in 2 mins    Time 12    Period Weeks    Status On-going      OT LONG TERM GOAL #3   Title Pt will perfrorm all basic ADLs modified indpendently    Time 12    Period Weeks    Status On-going      OT LONG TERM GOAL #4   Title Patient will be able to type with both hands within reasonable period of time in preparation for work activities.    Time 12    Period Weeks    Status On-going      OT LONG TERM GOAL #5   Title Pt will perform basic home management and cooking modified independently    Time 12    Period Weeks    Status New      OT LONG TERM GOAL #6   Title Pt will improve ability to fasten buttons as shown by being able to fasten/unfasten 3 buttons in less than 60 secs or less    Time 12    Period Weeks    Status New                   Plan - 12/10/20 1621     Clinical Impression Statement Pt with increased pain and discomfort today but continues to progress towards goals    OT Occupational Profile and History Problem Focused Assessment - Including review of records relating to presenting problem    Occupational performance deficits (Please refer to evaluation for details): ADL's;IADL's;Work;Leisure;Play;Rest and Sleep;Social Participation    Body Structure / Function / Physical Skills ADL;Flexibility;ROM;UE functional use;Decreased knowledge of use of DME;FMC;Balance;Dexterity;Gait;Sensation;GMC;Endurance;IADL;Coordination;Strength;Pain    Rehab Potential Good    Clinical Decision Making Several treatment  options, min-mod task modification necessary    Comorbidities Affecting Occupational Performance: May have comorbidities impacting occupational performance    Modification or Assistance to Complete Evaluation  Min-Moderate modification of tasks or assist with assess necessary to complete eval    OT Frequency 2x / week    plus eval, anticipate d/c after 8 weeks dependent on progress.   OT Duration 12 weeks    OT Treatment/Interventions Self-care/ADL training;Ultrasound;Energy conservation;Patient/family education;DME and/or AE instruction;Paraffin;Passive range of motion;Balance training;Cryotherapy;Fluidtherapy    Plan review HEP as needed, typing, coordination, continue progressing towards goals.    Consulted and Agree with Plan of Care Patient             Patient will benefit from skilled therapeutic intervention in order to improve the following deficits and impairments:   Body Structure / Function / Physical Skills: ADL, Flexibility, ROM, UE functional use, Decreased knowledge of use of DME, FMC, Balance, Dexterity, Gait, Sensation, GMC, Endurance, IADL, Coordination, Strength, Pain       Visit Diagnosis: Muscle weakness (generalized)  Other abnormalities of gait and mobility  Other disturbances of skin sensation  Unsteadiness on feet  Other lack of coordination  Cervicalgia    Problem List Patient Active Problem List   Diagnosis Date Noted   Abscess of right foot 08/16/2020   Cellulitis of right foot 08/16/2020   Microhematuria 08/03/2020   Gastroesophageal reflux disease 07/07/2019   External otitis of right ear 03/09/2017   Thumb laceration, right, initial encounter 03/09/2017   Weakness of left side of body 11/19/2016   Stress due to illness of family member 06/10/2016   Syncope 04/23/2016   Onychomycosis 04/23/2016   Impacted ear wax 04/23/2016   Constipation due to opioid therapy 06/13/2014   Chronic pain syndrome 02/21/2014   Allergic reaction 02/20/2014   Neurofibromatosis, type 1 (HCC) 12/14/2013   Status post cervical arthrodesis 11/21/2013   Cervical myelopathy (HCC) 09/21/2013   Preventative health care 06/23/2010   HORNER'S SYNDROME 04/12/2009   Other psoriasis 08/24/2007   Tinea pedis 08/24/2007    Junious Dresser, OT/L 12/10/2020, 4:54 PM  [facility] [address] [place], [place], [address] Phone: [phone]   Fax:  [phone]  Name: Larry Riley MRN: [id number] Date of Birth: 03-10-1980

## 2020-12-10 NOTE — Therapy (Signed)
[facility] [address] [place], [place], [address] Phone: [phone]   Fax:  [phone]  Physical Therapy Treatment  Patient Details  Name: Larry Riley MRN: [id number] Date of Birth: 1980/02/23 Referring Provider (PT): Dr. Gaspar Cola   Encounter Date: 12/10/2020   PT End of Session - 12/10/20 1655     Visit Number 14    Number of Visits 20    Date for PT Re-Evaluation 12/03/20    Authorization Type [employer] (60v max combined)    Authorization Time Period 11/22-12/26    Authorization - Visit Number 10    Authorization - Number of Visits 20    Progress Note Due on Visit 20    PT Start Time 1700    PT Stop Time 1745    PT Time Calculation (min) 45 min    Equipment Utilized During Treatment Gait belt    Activity Tolerance Patient tolerated treatment well    Behavior During Therapy [facility] for tasks assessed/performed             Past Medical History:  Diagnosis Date   GERD (gastroesophageal reflux disease) 01/21/2017   MRSA (methicillin resistant Staphylococcus aureus) 2005   leg   Neurofibromatosis    tumor down spine and brain    Past Surgical History:  Procedure Laterality Date   hydrocephalus     shunt 1999[place]   NECK SURGERY      tumor removal   SPINE SURGERY  10/2013   c2-t2 fusion  , laminectomy c1-6-- Dr Rigoberto Noel-- [facility]   VASECTOMY  02/2013    There were no vitals filed for this visit.   Subjective Assessment - 12/10/20 1658     Subjective Reporting elevated neck/back pain today, present when waking and less as day went on, slightly above baseline for him    Pertinent History C2-T2 cervical fusion, neurofibromatosis, L foot drop    How long can you sit comfortably? >30 min    How long can you stand comfortably? >15 min    How long can you walk comfortably? >15 min    Patient Stated Goals Walk at least 1 mile, to get in and out of chair easier without using arms too much,    Pain Onset  More than a month ago    Pain Onset More than a month ago                               [facility] Adult PT Treatment/Exercise - 12/10/20 0001       Transfers   Transfers Sit to Stand;Stand to Sit;Stand Pivot Transfers    Sit to Stand 6: Modified independent (Device/Increase time)    Stand to Sit 6: Modified independent (Device/Increase time)    Stand Pivot Transfers 6: Modified independent (Device/Increase time)      Ambulation/Gait   Gait Comments see treadmill info      Knee/Hip Exercises: Aerobic   Tread Mill 0%, 1.5 mph, 12' duration with need of B UE support, 1 min w/u and cooldown at 0.8 mph, 6# on LLE      Knee/Hip Exercises: Seated   Long Arc Quad Strengthening;Both;1 set;15 reps;Weights;Limitations    Long Arc Quad Weight 3 lbs.    Long [employer] Limitations with lat press alternating, weight on R only    Marching Strengthening;Both;1 set;15 reps;Weights;Limitations    Marching Limitations alt with lat press, 3# on R  Balance Exercises - 12/10/20 0001       Balance Exercises: Standing   Stepping Strategy Anterior;Lateral;Foam/compliant surface;10 reps;Limitations    Stepping Strategy Limitations from airex onto 6" block, no UE support    Rockerboard Anterior/posterior;Intermittent UE support;Limitations    Rockerboard Limitations from RB cone tapping 10x each LE    Step Ups Forward;Intermittent UE support;Limitations    Step Ups Limitations runners step 10x each from high profile RB    Step Over Hurdles / Cones floor ladder w/o UE support, CGA, 2 trips forward, 2 trips bwd 2 feet per square and repeated with 1 foot per square fwd only.  Sideatepping 2 feet per square for 1 trips back/forth                  PT Short Term Goals - 11/19/20 0807       PT SHORT TERM GOAL #1   Title Patient will be able to ambulate 1050' without AD on level ground with SBA to improve short community ambulation    Baseline met 11/19/20     Time 4    Period Weeks    Status Achieved    Target Date 11/05/20      PT SHORT TERM GOAL #2   Title Pt will demo 5x sit to stand score <20 seconds without HHA to improve functional strength in bil LE    Baseline 11.41 secs without UE support 11/19/20    Time 4    Period Weeks    Status Achieved    Target Date 11/05/20      PT SHORT TERM GOAL #3   Title Patient will be able to ambulate 400 feet on grass without AD and SBA to improve safe community negotiation    Baseline met 11/19/20    Time 4    Period Weeks    Status Achieved    Target Date 11/05/20               PT Long Term Goals - 12/02/20 1656       PT LONG TERM GOAL #1   Title Pt will demo <12 seconds with 5x sit to stand without HHA to improve functional strength in bil LE; 12/02/20 5x STS in <10s    Baseline 11.41 11/19/20; 12/02/20 5x STS 10.6s    Time 8    Period Weeks    Status Revised      PT LONG TERM GOAL #2   Title Pt will be able to ambulate 25 min without AD with walking program at home to improve walking endurance to access community    Baseline not attempted, walking program issued (10/08/20); 12/02/20 Has been walking 25-30 min at home with wife    Time 8    Period Weeks    Status Achieved      PT LONG TERM GOAL #3   Title Patient will demo >20/30 on functional gait assessment to improve functional balance and gait    Baseline 9/30 (10/08/20); 12/02/20 FGA 18/30    Time 8    Period Weeks    Status On-going      PT LONG TERM GOAL #4   Title Patient to perform floor transfer with Mod I    Baseline TBD    Time 4    Period Weeks    Status New    Target Date 01/06/21                   Plan - 12/10/20 1748  Clinical Impression Statement Continued treadmill walking with weighted limb, advance stepping tasks using RB to challenge balance and reactions, overall foot placement improved as evidenced by increased acuracy of foot ladder performance.  Needed single UE support fior runner  step task    Personal Factors and Comorbidities Comorbidity 3+    Comorbidities Neurofibromatosis, hx of cervical fusions, chronic neck pain, back pain, carpal tunnel syndrome, L foot drop    Examination-Activity Limitations Bend;Carry;Dressing;Lift;Reach Overhead;Sit;Sleep;Squat;Stairs;Stand;Transfers;Bathing    Examination-Participation Restrictions Cleaning;Community Activity;Driving;Laundry;Meal Prep;Occupation;Shop;Yard Work    [employer] Evolving/Moderate complexity    Rehab Potential Good    PT Frequency 2x / week    PT Duration 4 weeks    PT Treatment/Interventions ADLs/Self Care Home Management;Cryotherapy;Electrical Stimulation;Moist Heat;Traction;Gait training;Stair training;Functional mobility training;Therapeutic activities;Therapeutic exercise;Balance training;Neuromuscular re-education;Patient/family education;Orthotic Fit/Training;Manual techniques;Scar mobilization;Passive range of motion;Dry needling;Energy conservation;Spinal Manipulations;Joint Manipulations    PT Next Visit Plan Continue high level balance, resisted gait with weight on LLE, treadmill training uphill, core as needed, reactionary strategies/stepping focus on step accuracy and balance    PT Home Exercise Plan Walking program, [id number]    Consulted and Agree with Plan of Care Patient             Patient will benefit from skilled therapeutic intervention in order to improve the following deficits and impairments:  Abnormal gait, Decreased activity tolerance, Decreased balance, Decreased range of motion, Decreased mobility, Decreased endurance, Decreased skin integrity, Decreased strength, Difficulty walking, Increased fascial restricitons, Increased muscle spasms, Impaired flexibility, Impaired sensation, Impaired tone, Impaired UE functional use, Improper body mechanics, Postural dysfunction, Pain  Visit Diagnosis: Muscle weakness (generalized)  Other abnormalities of gait and  mobility  Other disturbances of skin sensation  Unsteadiness on feet     Problem List Patient Active Problem List   Diagnosis Date Noted   Abscess of right foot 08/16/2020   Cellulitis of right foot 08/16/2020   Microhematuria 08/03/2020   Gastroesophageal reflux disease 07/07/2019   External otitis of right ear 03/09/2017   Thumb laceration, right, initial encounter 03/09/2017   Weakness of left side of body 11/19/2016   Stress due to illness of family member 06/10/2016   Syncope 04/23/2016   Onychomycosis 04/23/2016   Impacted ear wax 04/23/2016   Constipation due to opioid therapy 06/13/2014   Chronic pain syndrome 02/21/2014   Allergic reaction 02/20/2014   Neurofibromatosis, type 1 ([place]) 12/14/2013   Status post cervical arthrodesis 11/21/2013   Cervical myelopathy ([place]) 09/21/2013   Preventative health care 06/23/2010   HORNER'S SYNDROME 04/12/2009   Other psoriasis 08/24/2007   Tinea pedis 08/24/2007    Lanice Shirts, PT 12/10/2020, 5:51 PM  [facility] [address] [place], [place], [address] Phone: [phone]   Fax:  [phone]  Name: Larry Riley MRN: [id number] Date of Birth: 1980/11/20

## 2020-12-12 ENCOUNTER — Other Ambulatory Visit: Payer: Self-pay

## 2020-12-12 ENCOUNTER — Ambulatory Visit: Payer: Managed Care, Other (non HMO) | Admitting: Occupational Therapy

## 2020-12-12 ENCOUNTER — Ambulatory Visit: Payer: Managed Care, Other (non HMO) | Attending: Neurosurgery

## 2020-12-12 DIAGNOSIS — R208 Other disturbances of skin sensation: Secondary | ICD-10-CM | POA: Diagnosis present

## 2020-12-12 DIAGNOSIS — M6281 Muscle weakness (generalized): Secondary | ICD-10-CM | POA: Diagnosis present

## 2020-12-12 DIAGNOSIS — R2689 Other abnormalities of gait and mobility: Secondary | ICD-10-CM

## 2020-12-12 DIAGNOSIS — R278 Other lack of coordination: Secondary | ICD-10-CM

## 2020-12-12 DIAGNOSIS — R2681 Unsteadiness on feet: Secondary | ICD-10-CM | POA: Diagnosis present

## 2020-12-12 DIAGNOSIS — R29898 Other symptoms and signs involving the musculoskeletal system: Secondary | ICD-10-CM | POA: Diagnosis present

## 2020-12-12 NOTE — Therapy (Signed)
[facility] [address] [place], [place], [address] Phone: [phone]   Fax:  [phone]  Occupational Therapy Treatment  Patient Details  Name: Larry Riley MRN: [id number] Date of Birth: [birth year] Referring Provider (OT): Dr. Raynald Kemp   Encounter Date: 12/12/2020   OT End of Session - 12/12/20 1548     Visit Number 17    Number of Visits 25    Date for OT Re-Evaluation 01/01/21    Authorization Type cigna    Authorization - Visit Number 17    Authorization - Number of Visits 30    OT Start Time 1539    OT Stop Time 1615    OT Time Calculation (min) 36 min    Activity Tolerance Patient tolerated treatment well    Behavior During Therapy [facility] for tasks assessed/performed             Past Medical History:  Diagnosis Date   GERD (gastroesophageal reflux disease) 01/21/2017   MRSA (methicillin resistant Staphylococcus aureus) 2005   leg   Neurofibromatosis    tumor down spine and brain    Past Surgical History:  Procedure Laterality Date   hydrocephalus     [place]unt 1999[place]   NECK SURGERY      tumor removal   SPINE SURGERY  10/2013   c2-t2 fusion  , laminectomy c1-6-- Dr Raynald Kemp-- [facility]   VASECTOMY  02/2013    There were no vitals filed for this visit.    Pain: 4-5/10 for neck and left shoulder / scapula,chronic pain Alleviating: rest, laying in supine Aggravating: malpositioning  Supine closed chain shoulder flexion, chest press and scapular retraction, min v.c Seated at table reviewed median n. Gliding exercises, min v.c and demonstration Discussed ergonomic keyboards and positioning for work. Simulated roller ball mouse and rotating a golf ball on tabletop with left thumb. Soft tissue mobs to trigger point at border of Left scapula Placing medium pegs into pegboard with RUE, min difficulty and v.c for correct positioning/ posture Removing pegs with in hand  manipulation.                     OT Education - 12/12/20 1554     Education Details --    Person(s) Educated --    Methods --    Comprehension --              OT Short Term Goals - 11/29/20 0742       OT SHORT TERM GOAL #1   Title Pt will be I with HEP -11/20/20    Time 6    Period Weeks    Status Achieved    Target Date 11/20/20      OT SHORT TERM GOAL #2   Title Pt will demonstrate improved coordination for ADLs as evidenced by placing 9 pegs in 2 mins  or less for RUE    Baseline 6 pegs placed in 2 mins    Time 6    Period Weeks    Status Achieved   1 min 38 secs     OT SHORT TERM GOAL #3   Title Pt will perform bathing and dressing with min A    Time 6    Period Weeks    Status Achieved      OT SHORT TERM GOAL #4   Title Pt will demonstrate understaning of adapted strategies/ AE to assist with ADLS (such as feeding, cutting food, using computer mouse.)  Time 6    Period Weeks    Status On-going      OT SHORT TERM GOAL #5   Title Pt will demonstrate improved coordination for ADLs as evidenced by decreasing 9 hole peg test to 50 secs or less for LUE    Baseline LUE 55.72 secs    Time 6    Period Weeks    Status Achieved   50.82, 40.19     OT SHORT TERM GOAL #6   Title Pt will demonstrate improved functional use of RUE as evidenced by increasing box/ blocks score to 37 blocks or greater.    Time 6    Period Weeks    Status On-going   31 blocks              OT Long Term Goals - 12/12/20 1610       OT LONG TERM GOAL #1   Title Pt will be I with upgraded HEP prn-01/02/20    Time 12    Period Weeks    Status On-going      OT LONG TERM GOAL #2   Title Pt will demonstrate improved coordination for ADLs as evidenced by completing 9 hole peg test in 90 secs or less with RUE.    Baseline 6 pegs placed in 2 mins    Time 12    Period Weeks    Status On-going      OT LONG TERM GOAL #3   Title Pt will perfrorm all basic ADLs  modified indpendently    Time 12    Period Weeks    Status Achieved      OT LONG TERM GOAL #4   Title Patient will be able to type with both hands within reasonable period of time in preparation for work activities.    Time 12    Period Weeks    Status On-going      OT LONG TERM GOAL #5   Title Pt will perform basic home management and cooking modified independently    Time 12    Period Weeks    Status On-going      OT LONG TERM GOAL #6   Title Pt will improve ability to fasten buttons as shown by being able to fasten/unfasten 3 buttons in less than 60 secs or less    Time 12    Period Weeks    Status New                   Plan - 12/12/20 1549     Clinical Impression Statement Pt is progressing towards goals. He has requested accomodations from work, he will get work to fax the information for therapist review.    OT Occupational Profile and History Problem Focused Assessment - Including review of records relating to presenting problem    Occupational performance deficits (Please refer to evaluation for details): ADL's;IADL's;Work;Leisure;Play;Rest and Sleep;Social Participation    Body Structure / Function / Physical Skills ADL;Flexibility;ROM;UE functional use;Decreased knowledge of use of DME;FMC;Balance;Dexterity;Gait;Sensation;GMC;Endurance;IADL;Coordination;Strength;Pain    Rehab Potential Good    Clinical Decision Making Several treatment options, min-mod task modification necessary    Comorbidities Affecting Occupational Performance: May have comorbidities impacting occupational performance    Modification or Assistance to Complete Evaluation  Min-Moderate modification of tasks or assist with assess necessary to complete eval    OT Frequency 2x / week   plus eval, anticipate d/c after 8 weeks dependent on progress.   OT Duration 12 weeks  OT Treatment/Interventions Self-care/ADL training;Ultrasound;Energy conservation;Patient/family education;DME and/or AE  instruction;Paraffin;Passive range of motion;Balance training;Cryotherapy;Fluidtherapy    Plan review HEP as needed, typing, coordination, continue progressing towards goals.    Consulted and Agree with Plan of Care Patient             Patient will benefit from skilled therapeutic intervention in order to improve the following deficits and impairments:   Body Structure / Function / Physical Skills: ADL, Flexibility, ROM, UE functional use, Decreased knowledge of use of DME, FMC, Balance, Dexterity, Gait, Sensation, GMC, Endurance, IADL, Coordination, Strength, Pain       Visit Diagnosis: Muscle weakness (generalized)  Other abnormalities of gait and mobility  Other disturbances of skin sensation  Other lack of coordination    Problem List Patient Active Problem List   Diagnosis Date Noted   Abscess of right foot 08/16/2020   Cellulitis of right foot 08/16/2020   Microhematuria 08/03/2020   Gastroesophageal reflux disease 07/07/2019   External otitis of right ear 03/09/2017   Thumb laceration, right, initial encounter 03/09/2017   Weakness of left side of body 11/19/2016   Stress due to illness of family member 06/10/2016   Syncope 04/23/2016   Onychomycosis 04/23/2016   Impacted ear wax 04/23/2016   Constipation due to opioid therapy 06/13/2014   Chronic pain syndrome 02/21/2014   Allergic reaction 02/20/2014   Neurofibromatosis, type 1 ([facility]) 12/14/2013   Status post cervical arthrodesis 11/21/2013   Cervical myelopathy ([facility]) 09/21/2013   Preventative health care 06/23/2010   HORNER'S SYNDROME 04/12/2009   Other psoriasis 08/24/2007   Tinea pedis 08/24/2007    Shashwat Cleary, OT 12/12/2020, 4:11 PM  [facility] [address] [place], [place], [address] Phone: [phone]   Fax:  [phone]  Name: Larry Riley MRN: [id number] Date of Birth: 05/02/1980

## 2020-12-12 NOTE — Therapy (Signed)
[facility] [address] [place], [place], [address] Phone: [phone]   Fax:  [phone]  Physical Therapy Treatment  Patient Details  Name: Cleotis Sparr MRN: [id number] Date of Birth: July 01, 1980 Referring Provider (PT): Dr. Gaspar Cola   Encounter Date: 12/12/2020   PT End of Session - 12/12/20 1708     Visit Number 15    Number of Visits 20    Date for PT Re-Evaluation 01/06/21    Authorization Type [employer] (60v max combined)    Authorization Time Period 11/22-12/26    Authorization - Number of Visits 20    Progress Note Due on Visit 20    PT Start Time 1620    PT Stop Time 1700    PT Time Calculation (min) 40 min    Equipment Utilized During Treatment Gait belt    Activity Tolerance Patient tolerated treatment well    Behavior During Therapy [facility] for tasks assessed/performed             Past Medical History:  Diagnosis Date   GERD (gastroesophageal reflux disease) 01/21/2017   MRSA (methicillin resistant Staphylococcus aureus) 2005   leg   Neurofibromatosis    tumor down spine and brain    Past Surgical History:  Procedure Laterality Date   hydrocephalus     [place]unt 1999[place]   NECK SURGERY      tumor removal   SPINE SURGERY  10/2013   c2-t2 fusion  , laminectomy c1-6-- Dr Rigoberto Noel-- [place]   VASECTOMY  02/2013    There were no vitals filed for this visit.   Subjective Assessment - 12/12/20 1621     Subjective Has been doing well overall but neck and back pain slightly elevated due to work stress    Pertinent History C2-T2 cervical fusion, neurofibromatosis, L foot drop    How long can you sit comfortably? >30 min    How long can you stand comfortably? >15 min    How long can you walk comfortably? >15 min    Patient Stated Goals Walk at least 1 mile, to get in and out of chair easier without using arms too much,    Pain Onset More than a month ago    Pain Onset More than a month ago                                [facility] Adult PT Treatment/Exercise - 12/12/20 0001       Transfers   Transfers Sit to Stand;Stand to Sit;Stand Pivot Transfers    Sit to Stand 6: Modified independent (Device/Increase time)    Stand to Sit 6: Modified independent (Device/Increase time)    Stand Pivot Transfers 6: Modified independent (Device/Increase time)    Number of Reps 10 reps    Comments from [employer] no UE support      Ambulation/Gait   Gait Comments see treadmill info      Knee/Hip Exercises: Aerobic   Tread Mill 0%, 1.5 mph, 12' duration with need of B UE support, 1 min w/u and cooldown at 0.8 mph, 6# on LLE      Knee/Hip Exercises: Seated   Long Arc Quad Strengthening;Both;1 set;15 reps;Limitations    Long Arc Quad Limitations with lat press alternating, from sitting disk    Marching Strengthening;Both;1 set;15 reps;Limitations    Marching Limitations alt with lat press, from balance disk  Balance Exercises - 12/12/20 0001       Balance Exercises: Standing   Rockerboard Anterior/posterior;10 reps;Intermittent UE support;Limitations    Rockerboard Limitations performed RB squats and step ups in // bars    Other Standing Exercises marching on airex pad , 10x each LE                  PT Short Term Goals - 11/19/20 0807       PT SHORT TERM GOAL #1   Title Patient will be able to ambulate 1050' without AD on level ground with SBA to improve short community ambulation    Baseline met 11/19/20    Time 4    Period Weeks    Status Achieved    Target Date 11/05/20      PT SHORT TERM GOAL #2   Title Pt will demo 5x sit to stand score <20 seconds without HHA to improve functional strength in bil LE    Baseline 11.41 secs without UE support 11/19/20    Time 4    Period Weeks    Status Achieved    Target Date 11/05/20      PT SHORT TERM GOAL #3   Title Patient will be able to ambulate 400 feet on grass without AD and SBA to improve  safe community negotiation    Baseline met 11/19/20    Time 4    Period Weeks    Status Achieved    Target Date 11/05/20               PT Long Term Goals - 12/12/20 1716       PT LONG TERM GOAL #1   Title Pt will demo <12 seconds with 5x sit to stand without HHA to improve functional strength in bil LE; 12/02/20 5x STS in <10s    Baseline 11.41 11/19/20; 12/02/20 5x STS 10.6s    Time 8    Period Weeks    Status Revised    Target Date 01/06/21      PT LONG TERM GOAL #2   Title Pt will be able to ambulate 25 min without AD with walking program at home to improve walking endurance to access community    Baseline not attempted, walking program issued (10/08/20); 12/02/20 Has been walking 25-30 min at home with wife    Time 8    Period Weeks    Status Achieved    Target Date 01/06/21      PT LONG TERM GOAL #3   Title Patient will demo >20/30 on functional gait assessment to improve functional balance and gait    Baseline 9/30 (10/08/20); 12/02/20 FGA 18/30    Time 8    Period Weeks    Status On-going    Target Date 01/06/21      PT LONG TERM GOAL #4   Title Patient to perform floor transfer with Mod I    Baseline TBD    Time 4    Period Weeks    Status New    Target Date 01/06/21                   Plan - 12/12/20 1709     Clinical Impression Statement Treadmill training for carryover into gait emphasizing heel toe pattern with heel strike and avoid pounding and focus on foot placement.  instructed in RB tasks to perform at home including squats and step ups.  Added sitting disk to seated core tasks to challenge  core and seated balance focus on accurate step placement.    Personal Factors and Comorbidities Comorbidity 3+    Comorbidities Neurofibromatosis, hx of cervical fusions, chronic neck pain, back pain, carpal tunnel syndrome, L foot drop    Examination-Activity Limitations Bend;Carry;Dressing;Lift;Reach  Overhead;Sit;Sleep;Squat;Stairs;Stand;Transfers;Bathing    Examination-Participation Restrictions Cleaning;Community Activity;Driving;Laundry;Meal Prep;Occupation;Shop;Yard Work    [employer] Evolving/Moderate complexity    Rehab Potential Good    PT Frequency 2x / week    PT Duration 4 weeks    PT Treatment/Interventions ADLs/Self Care Home Management;Cryotherapy;Electrical Stimulation;Moist Heat;Traction;Gait training;Stair training;Functional mobility training;Therapeutic activities;Therapeutic exercise;Balance training;Neuromuscular re-education;Patient/family education;Orthotic Fit/Training;Manual techniques;Scar mobilization;Passive range of motion;Dry needling;Energy conservation;Spinal Manipulations;Joint Manipulations    PT Next Visit Plan Continue high level balance, resisted gait with weight on LLE, treadmill training uphill, core as needed, reactionary strategies/stepping focus on step accuracy and balance, circuit training    PT Home Exercise Plan Walking program, [id number]    Consulted and Agree with Plan of Care Patient             Patient will benefit from skilled therapeutic intervention in order to improve the following deficits and impairments:  Abnormal gait, Decreased activity tolerance, Decreased balance, Decreased range of motion, Decreased mobility, Decreased endurance, Decreased skin integrity, Decreased strength, Difficulty walking, Increased fascial restricitons, Increased muscle spasms, Impaired flexibility, Impaired sensation, Impaired tone, Impaired UE functional use, Improper body mechanics, Postural dysfunction, Pain  Visit Diagnosis: Unsteadiness on feet  Other lack of coordination  Muscle weakness (generalized)     Problem List Patient Active Problem List   Diagnosis Date Noted   Abscess of right foot 08/16/2020   Cellulitis of right foot 08/16/2020   Microhematuria 08/03/2020   Gastroesophageal reflux disease 07/07/2019    External otitis of right ear 03/09/2017   Thumb laceration, right, initial encounter 03/09/2017   Weakness of left side of body 11/19/2016   Stress due to illness of family member 06/10/2016   Syncope 04/23/2016   Onychomycosis 04/23/2016   Impacted ear wax 04/23/2016   Constipation due to opioid therapy 06/13/2014   Chronic pain syndrome 02/21/2014   Allergic reaction 02/20/2014   Neurofibromatosis, type 1 ([place]) 12/14/2013   Status post cervical arthrodesis 11/21/2013   Cervical myelopathy ([place]) 09/21/2013   Preventative health care 06/23/2010   HORNER'S SYNDROME 04/12/2009   Other psoriasis 08/24/2007   Tinea pedis 08/24/2007    Lanice Shirts, PT 12/12/2020, 5:18 PM  [facility] [address] [place], [place], [address] Phone: [phone]   Fax:  [phone]  Name: Abb Gobert MRN: [id number] Date of Birth: December 26, 1980

## 2020-12-17 ENCOUNTER — Ambulatory Visit: Payer: Managed Care, Other (non HMO) | Admitting: Physical Therapy

## 2020-12-17 ENCOUNTER — Ambulatory Visit: Payer: Managed Care, Other (non HMO) | Admitting: Occupational Therapy

## 2020-12-17 ENCOUNTER — Other Ambulatory Visit: Payer: Self-pay

## 2020-12-17 DIAGNOSIS — R2681 Unsteadiness on feet: Secondary | ICD-10-CM | POA: Diagnosis not present

## 2020-12-17 DIAGNOSIS — R278 Other lack of coordination: Secondary | ICD-10-CM

## 2020-12-17 DIAGNOSIS — R208 Other disturbances of skin sensation: Secondary | ICD-10-CM

## 2020-12-17 DIAGNOSIS — R2689 Other abnormalities of gait and mobility: Secondary | ICD-10-CM

## 2020-12-17 DIAGNOSIS — M6281 Muscle weakness (generalized): Secondary | ICD-10-CM

## 2020-12-17 NOTE — Therapy (Signed)
[facility] [address] [place], [place], [address] Phone: [phone]   Fax:  [phone]  Occupational Therapy Treatment  Patient Details  Name: Larry Riley MRN: [id number] Date of Birth: 1980/08/02 Referring Provider (OT): Dr. Raynald Kemp   Encounter Date: 12/17/2020   OT End of Session - 12/17/20 0800     Visit Number 18    Number of Visits 25    Date for OT Re-Evaluation 01/01/21    Authorization Type c[employer]    Authorization - Visit Number 18    Authorization - Number of Visits 30    OT Start Time [phone]    OT Stop Time 0800    OT Time Calculation (min) 42 min    Activity Tolerance Patient tolerated treatment well    Behavior During Therapy [facility] for tasks assessed/performed             Past Medical History:  Diagnosis Date   GERD (gastroesophageal reflux disease) 01/21/2017   MRSA (methicillin resistant Staphylococcus aureus) 2005   leg   Neurofibromatosis    tumor down spine and brain    Past Surgical History:  Procedure Laterality Date   hydrocephalus     [place]unt 1999[place]   NECK SURGERY      tumor removal   SPINE SURGERY  10/2013   c2-t2 fusion  , laminectomy c1-6-- Dr Raynald Kemp-- [facility]   VASECTOMY  02/2013    There were no vitals filed for this visit.   Subjective Assessment - 12/17/20 0759     Subjective  Pt reports his pain is good today    Currently in Pain? Yes    Pain Score 4     Pain Location Shoulder    Pain Orientation Right    Pain Descriptors / Indicators Aching    Pain Onset More than a month ago    Pain Frequency Constant    Aggravating Factors  activity, malpositioning    Pain Relieving Factors rest    Pain Onset More than a month ago                Treatment: Placing [place] pegs into pegboard with RUE, for increased fine motor coordination, removing with tweezers for sustained pinch, min difficulty. Gripper set at level 2 for sustained grip, min difficulty Arm bike  level 3 x 5 mins for conditioning Discussed plans to wrap up therapy the week of 01/01/21.                  OT Education - 12/17/20 0808     Education Details reveiwed yellow theraband exercises 10 reps each, min v.c    Person(s) Educated Patient    Methods Explanation;Demonstration;Verbal cues    Comprehension Verbalized understanding;Returned demonstration;Verbal cues required              OT Short Term Goals - 11/29/20 0742       OT SHORT TERM GOAL #1   Title Pt will be I with HEP -11/20/20    Time 6    Period Weeks    Status Achieved    Target Date 11/20/20      OT SHORT TERM GOAL #2   Title Pt will demonstrate improved coordination for ADLs as evidenced by placing 9 pegs in 2 mins  or less for RUE    Baseline 6 pegs placed in 2 mins    Time 6    Period Weeks    Status Achieved   1 min 38 secs  OT SHORT TERM GOAL #3   Title Pt will perform bathing and dressing with min A    Time 6    Period Weeks    Status Achieved      OT SHORT TERM GOAL #4   Title Pt will demonstrate understaning of adapted strategies/ AE to assist with ADLS (such as feeding, cutting food, using computer mouse.)    Time 6    Period Weeks    Status On-going      OT SHORT TERM GOAL #5   Title Pt will demonstrate improved coordination for ADLs as evidenced by decreasing 9 hole peg test to 50 secs or less for LUE    Baseline LUE 55.72 secs    Time 6    Period Weeks    Status Achieved   50.82, 40.19     OT SHORT TERM GOAL #6   Title Pt will demonstrate improved functional use of RUE as evidenced by increasing box/ blocks score to 37 blocks or greater.    Time 6    Period Weeks    Status On-going   31 blocks              OT Long Term Goals - 12/17/20 0755       OT LONG TERM GOAL #1   Title Pt will be I with upgraded HEP prn-01/02/20    Time 12    Period Weeks    Status On-going      OT LONG TERM GOAL #2   Title Pt will demonstrate improved coordination for  ADLs as evidenced by completing 9 hole peg test in 90 secs or less with RUE.    Baseline 6 pegs placed in 2 mins    Time 12    Period Weeks    Status On-going      OT LONG TERM GOAL #3   Title Pt will perfrorm all basic ADLs modified indpendently    Time 12    Period Weeks    Status Achieved      OT LONG TERM GOAL #4   Title Patient will be able to type with both hands within reasonable period of time in preparation for work activities.    Time 12    Period Weeks    Status On-going      OT LONG TERM GOAL #5   Title Pt will perform basic home management and cooking modified independently    Time 12    Period Weeks    Status On-going      OT LONG TERM GOAL #6   Title Pt will improve ability to fasten buttons as shown by being able to fasten/unfasten 3 buttons in less than 60 secs or less    Time 12    Period Weeks    Status New                   Plan - 12/17/20 0801     Clinical Impression Statement Pt is progressing towards goals. He has decreased overall pain and strength and coordination is improving.    OT Occupational Profile and History Problem Focused Assessment - Including review of records relating to presenting problem    Occupational performance deficits (Please refer to evaluation for details): ADL's;IADL's;Work;Leisure;Play;Rest and Sleep;Social Participation    Body Structure / Function / Physical Skills ADL;Flexibility;ROM;UE functional use;Decreased knowledge of use of DME;FMC;Balance;Dexterity;Gait;Sensation;GMC;Endurance;IADL;Coordination;Strength;Pain    Rehab Potential Good    Clinical Decision Making Several treatment options, min-mod task modification necessary  Comorbidities Affecting Occupational Performance: May have comorbidities impacting occupational performance    Modification or Assistance to Complete Evaluation  Min-Moderate modification of tasks or assist with assess necessary to complete eval    OT Frequency 2x / week   plus eval,  anticipate d/c after 8 weeks dependent on progress.   OT Duration 12 weeks    OT Treatment/Interventions Self-care/ADL training;Ultrasound;Energy conservation;Patient/family education;DME and/or AE instruction;Paraffin;Passive range of motion;Balance training;Cryotherapy;Fluidtherapy    Plan typing, coordination, continue progressing towards goals.    Consulted and Agree with Plan of Care Patient             Patient will benefit from skilled therapeutic intervention in order to improve the following deficits and impairments:   Body Structure / Function / Physical Skills: ADL, Flexibility, ROM, UE functional use, Decreased knowledge of use of DME, FMC, Balance, Dexterity, Gait, Sensation, GMC, Endurance, IADL, Coordination, Strength, Pain       Visit Diagnosis: Muscle weakness (generalized)  Other abnormalities of gait and mobility  Other disturbances of skin sensation  Other lack of coordination    Problem List Patient Active Problem List   Diagnosis Date Noted   Abscess of right foot 08/16/2020   Cellulitis of right foot 08/16/2020   Microhematuria 08/03/2020   Gastroesophageal reflux disease 07/07/2019   External otitis of right ear 03/09/2017   Thumb laceration, right, initial encounter 03/09/2017   Weakness of left side of body 11/19/2016   Stress due to illness of family member 06/10/2016   Syncope 04/23/2016   Onychomycosis 04/23/2016   Impacted ear wax 04/23/2016   Constipation due to opioid therapy 06/13/2014   Chronic pain syndrome 02/21/2014   Allergic reaction 02/20/2014   Neurofibromatosis, type 1 ([facility]) 12/14/2013   Status post cervical arthrodesis 11/21/2013   Cervical myelopathy ([facility]) 09/21/2013   Preventative health care 06/23/2010   HORNER'S SYNDROME 04/12/2009   Other psoriasis 08/24/2007   Tinea pedis 08/24/2007    Blayre Papania, OT 12/17/2020, 8:10 AM  [facility] [address] [place], [place], [address] Phone: [phone]   Fax:  [phone]  Name: Larry Riley MRN: [id number] Date of Birth: Oct 29, 1980

## 2020-12-18 ENCOUNTER — Encounter: Payer: Self-pay | Admitting: Physical Therapy

## 2020-12-18 NOTE — Therapy (Signed)
[facility] [address] [place], [place], [address] Phone: [phone]   Fax:  [phone]  Physical Therapy Treatment  Patient Details  Name: Larry Riley MRN: [id number] Date of Birth: 11/18/1980 Referring Provider (PT): Dr. Gaspar Cola   Encounter Date: 12/17/2020   PT End of Session - 12/18/20 1258     Visit Number 16    Number of Visits 20    Date for PT Re-Evaluation 01/06/21    Authorization Type [employer] (60v max combined)    Authorization Time Period 11/22-12/26    Authorization - Visit Number 11    Authorization - Number of Visits 20    Progress Note Due on Visit 41    PT Start Time 0802    PT Stop Time [phone]   pt needed to leave early due to transportation   PT Time Calculation (min) 30 min    Activity Tolerance Patient tolerated treatment well    Behavior During Therapy [facility] for tasks assessed/performed             Past Medical History:  Diagnosis Date   GERD (gastroesophageal reflux disease) 01/21/2017   MRSA (methicillin resistant Staphylococcus aureus) 2005   leg   Neurofibromatosis    tumor down spine and brain    Past Surgical History:  Procedure Laterality Date   hydrocephalus     shunt 1999[place]   NECK SURGERY      tumor removal   SPINE SURGERY  10/2013   c2-t2 fusion  , laminectomy c1-6-- Dr Rigoberto Noel-- [place]   VASECTOMY  02/2013    There were no vitals filed for this visit.   Subjective Assessment - 12/18/20 1239     Subjective Pt states he is doing better - reports less pain today than he has had in recent previous sessions.  Pt states he needs to end session early today due to having only 1 car and needing to get to work    Pertinent History C2-T2 cervical fusion, neurofibromatosis, L foot drop    How long can you sit comfortably? >30 min    How long can you stand comfortably? >15 min    How long can you walk comfortably? >15 min    Patient Stated Goals Walk at least 1 mile,  to get in and out of chair easier without using arms too much,    Currently in Pain? Yes    Pain Score 4     Pain Location Shoulder    Pain Orientation Right    Pain Descriptors / Indicators Aching    Pain Type Chronic pain    Pain Onset More than a month ago    Pain Frequency Constant    Pain Onset More than a month ago               TherEx:  Pt instructed in HEP for isolated Lt hip strengthening and also for hamstring strengthening - Medbridge FENGWLKZ:  Lt hip abduction - no weight - performed in sidelying position - 10 reps  Lt clam shell - 3# 10 reps Lt hip extension with knee extended no weight 10 reps  Lt hip extension with knee flexed at 90 degrees - no weight - 10 reps - in prone position  Lt knee flexion (in prone position) with 3# weight; cues to lower leg slowly for increased eccentric hamstring strengthening  PT Education - 12/18/20 1258     Education Details Medbridge HEP - FENGWLKZ    Person(s) Educated Patient    Methods Explanation;Demonstration;Handout    Comprehension Verbalized understanding;Returned demonstration              PT Short Term Goals - 12/18/20 1332       PT SHORT TERM GOAL #1   Title Patient will be able to ambulate 1050' without AD on level ground with SBA to improve short community ambulation    Baseline met 11/19/20    Time 4    Period Weeks    Status Achieved    Target Date 11/05/20      PT SHORT TERM GOAL #2   Title Pt will demo 5x sit to stand score <20 seconds without HHA to improve functional strength in bil LE    Baseline 11.41 secs without UE support 11/19/20    Time 4    Period Weeks    Status Achieved    Target Date 11/05/20      PT SHORT TERM GOAL #3   Title Patient will be able to ambulate 400 feet on grass without AD and SBA to improve safe community negotiation    Baseline met 11/19/20    Time 4    Period Weeks    Status Achieved    Target Date 11/05/20                PT Long Term Goals - 12/18/20 1332       PT LONG TERM GOAL #1   Title Pt will demo <12 seconds with 5x sit to stand without HHA to improve functional strength in bil LE; 12/02/20 5x STS in <10s    Baseline 11.41 11/19/20; 12/02/20 5x STS 10.6s    Time 8    Period Weeks    Status Revised    Target Date 01/06/21      PT LONG TERM GOAL #2   Title Pt will be able to ambulate 25 min without AD with walking program at home to improve walking endurance to access community    Baseline not attempted, walking program issued (10/08/20); 12/02/20 Has been walking 25-30 min at home with wife    Time 8    Period Weeks    Status Achieved    Target Date 01/06/21      PT LONG TERM GOAL #3   Title Patient will demo >20/30 on functional gait assessment to improve functional balance and gait    Baseline 9/30 (10/08/20); 12/02/20 FGA 18/30    Time 8    Period Weeks    Status On-going    Target Date 01/06/21      PT LONG TERM GOAL #4   Title Patient to perform floor transfer with Mod I    Baseline TBD    Time 4    Period Weeks    Status New    Target Date 01/06/21                   Plan - 12/18/20 1259     Clinical Impression Statement Skilled PT session focused on establishing HEP to address Lt hip musc. and hamstring weakness with isolated muscle groups targeted.  Pt able to perform PRE's with 3# weight for clam shell exercise and hamstring strengthening.  Cont with POC.    Personal Factors and Comorbidities Comorbidity 3+    Comorbidities Neurofibromatosis, hx of cervical fusions, chronic neck pain, back pain, carpal tunnel  syndrome, L foot drop    Examination-Activity Limitations Bend;Carry;Dressing;Lift;Reach Overhead;Sit;Sleep;Squat;Stairs;Stand;Transfers;Bathing    Examination-Participation Restrictions Cleaning;Community Activity;Driving;Laundry;Meal Prep;Occupation;Shop;Yard Work    [employer] Evolving/Moderate complexity    Rehab  Potential Good    PT Frequency 2x / week    PT Duration 4 weeks    PT Treatment/Interventions ADLs/Self Care Home Management;Cryotherapy;Electrical Stimulation;Moist Heat;Traction;Gait training;Stair training;Functional mobility training;Therapeutic activities;Therapeutic exercise;Balance training;Neuromuscular re-education;Patient/family education;Orthotic Fit/Training;Manual techniques;Scar mobilization;Passive range of motion;Dry needling;Energy conservation;Spinal Manipulations;Joint Manipulations    PT Next Visit Plan Continue high level balance, resisted gait with weight on LLE, treadmill training uphill, core as needed, reactionary strategies/stepping focus on step accuracy and balance, circuit training    PT Home Exercise Plan Walking program, [id number]    Consulted and Agree with Plan of Care Patient             Patient will benefit from skilled therapeutic intervention in order to improve the following deficits and impairments:  Abnormal gait, Decreased activity tolerance, Decreased balance, Decreased range of motion, Decreased mobility, Decreased endurance, Decreased skin integrity, Decreased strength, Difficulty walking, Increased fascial restricitons, Increased muscle spasms, Impaired flexibility, Impaired sensation, Impaired tone, Impaired UE functional use, Improper body mechanics, Postural dysfunction, Pain  Visit Diagnosis: Muscle weakness (generalized)     Problem List Patient Active Problem List   Diagnosis Date Noted   Abscess of right foot 08/16/2020   Cellulitis of right foot 08/16/2020   Microhematuria 08/03/2020   Gastroesophageal reflux disease 07/07/2019   External otitis of right ear 03/09/2017   Thumb laceration, right, initial encounter 03/09/2017   Weakness of left side of body 11/19/2016   Stress due to illness of family member 06/10/2016   Syncope 04/23/2016   Onychomycosis 04/23/2016   Impacted ear wax 04/23/2016   Constipation due to opioid therapy  06/13/2014   Chronic pain syndrome 02/21/2014   Allergic reaction 02/20/2014   Neurofibromatosis, type 1 ([place]) 12/14/2013   Status post cervical arthrodesis 11/21/2013   Cervical myelopathy ([place]) 09/21/2013   Preventative health care 06/23/2010   HORNER'S SYNDROME 04/12/2009   Other psoriasis 08/24/2007   Tinea pedis 08/24/2007    Alda Lea, PT 12/18/2020, 1:33 PM  [facility] [address] [place], [place], [address] Phone: [phone]   Fax:  [phone]  Name: Larry Riley MRN: [id number] Date of Birth: 1980/06/08

## 2020-12-19 ENCOUNTER — Other Ambulatory Visit: Payer: Self-pay

## 2020-12-19 ENCOUNTER — Ambulatory Visit: Payer: Managed Care, Other (non HMO) | Admitting: Occupational Therapy

## 2020-12-19 ENCOUNTER — Ambulatory Visit: Payer: Managed Care, Other (non HMO) | Admitting: Physical Therapy

## 2020-12-19 ENCOUNTER — Encounter: Payer: Self-pay | Admitting: Occupational Therapy

## 2020-12-19 DIAGNOSIS — R2681 Unsteadiness on feet: Secondary | ICD-10-CM | POA: Diagnosis not present

## 2020-12-19 DIAGNOSIS — M6281 Muscle weakness (generalized): Secondary | ICD-10-CM

## 2020-12-19 DIAGNOSIS — R278 Other lack of coordination: Secondary | ICD-10-CM

## 2020-12-19 DIAGNOSIS — R2689 Other abnormalities of gait and mobility: Secondary | ICD-10-CM

## 2020-12-19 DIAGNOSIS — R208 Other disturbances of skin sensation: Secondary | ICD-10-CM

## 2020-12-19 NOTE — Patient Instructions (Signed)

## 2020-12-19 NOTE — Therapy (Signed)
[facility] [address] [place], [place], [address] Phone: [phone]   Fax:  [phone]  Occupational Therapy Treatment  Patient Details  Name: Larry Riley MRN: [id number] Date of Birth: 1980-03-03 Referring Provider (OT): Dr. Raynald Kemp   Encounter Date: 12/19/2020   OT End of Session - 12/19/20 0848     Visit Number 19    Number of Visits 25    Date for OT Re-Evaluation 01/01/21    Authorization Type cigna    Authorization - Visit Number 19    Authorization - Number of Visits 30    OT Start Time 0845    OT Stop Time 0930    OT Time Calculation (min) 45 min    Activity Tolerance Patient tolerated treatment well    Behavior During Therapy [facility] for tasks assessed/performed             Past Medical History:  Diagnosis Date   GERD (gastroesophageal reflux disease) 01/21/2017   MRSA (methicillin resistant Staphylococcus aureus) 2005   leg   Neurofibromatosis    tumor down spine and brain    Past Surgical History:  Procedure Laterality Date   hydrocephalus     [place]unt 1999[place]   NECK SURGERY      tumor removal   SPINE SURGERY  10/2013   c2-t2 fusion  , laminectomy c1-6-- Dr Raynald Kemp-- [facility]   VASECTOMY  02/2013    There were no vitals filed for this visit.   Subjective Assessment - 12/19/20 0847     Subjective  I've been busy but good. The pain has been lower this week.    Pertinent History Larry Riley is a 40 y.o. 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 Raynald Kemp. PMH: Neurofibromatosis, type 1,   debulking of cervical spine tumor/Cervical athrodesis, Horners Syndrome, and hydrocephalus, numerous cervical surgeries over the years (6-7    Limitations no restrictions per MD now, progress at pt's own comfort level    Currently in Pain? Yes    Pain Score 4     Pain Location Back    Pain Orientation Right    Pain Descriptors / Indicators Aching    Pain Type Chronic  pain    Pain Onset More than a month ago    Pain Frequency Constant              Adapted Equipment pt reports he has been in touch with the department at his job that does this and will bring in form to be signed re: necessity for keyboard and mouse.  Theraband review reviewed with min/mod cues.   Connect Four in hand manipulation mod drops and with mod difficulty with RUE.   Hand Gripper: with RUE on level 3 with black spring. Pt picked up 1 inch blocks with gripper with min drops and min difficulty. Upgraded level from last session with minimal difficulty.   Arm Bike: for conditioning and reciprocal movements on Level 2 for 6 minutes (forward and backward)                      OT Short Term Goals - 11/29/20 0742       OT SHORT TERM GOAL #1   Title Pt will be I with HEP -11/20/20    Time 6    Period Weeks    Status Achieved    Target Date 11/20/20      OT SHORT TERM  GOAL #2   Title Pt will demonstrate improved coordination for ADLs as evidenced by placing 9 pegs in 2 mins  or less for RUE    Baseline 6 pegs placed in 2 mins    Time 6    Period Weeks    Status Achieved   1 min 38 secs     OT SHORT TERM GOAL #3   Title Pt will perform bathing and dressing with min A    Time 6    Period Weeks    Status Achieved      OT SHORT TERM GOAL #4   Title Pt will demonstrate understaning of adapted strategies/ AE to assist with ADLS (such as feeding, cutting food, using computer mouse.)    Time 6    Period Weeks    Status On-going      OT SHORT TERM GOAL #5   Title Pt will demonstrate improved coordination for ADLs as evidenced by decreasing 9 hole peg test to 50 secs or less for LUE    Baseline LUE 55.72 secs    Time 6    Period Weeks    Status Achieved   50.82, 40.19     OT SHORT TERM GOAL #6   Title Pt will demonstrate improved functional use of RUE as evidenced by increasing box/ blocks score to 37 blocks or greater.    Time 6    Period Weeks     Status On-going   31 blocks              OT Long Term Goals - 12/17/20 0755       OT LONG TERM GOAL #1   Title Pt will be I with upgraded HEP prn-01/02/20    Time 12    Period Weeks    Status On-going      OT LONG TERM GOAL #2   Title Pt will demonstrate improved coordination for ADLs as evidenced by completing 9 hole peg test in 90 secs or less with RUE.    Baseline 6 pegs placed in 2 mins    Time 12    Period Weeks    Status On-going      OT LONG TERM GOAL #3   Title Pt will perfrorm all basic ADLs modified indpendently    Time 12    Period Weeks    Status Achieved      OT LONG TERM GOAL #4   Title Patient will be able to type with both hands within reasonable period of time in preparation for work activities.    Time 12    Period Weeks    Status On-going      OT LONG TERM GOAL #5   Title Pt will perform basic home management and cooking modified independently    Time 12    Period Weeks    Status On-going      OT LONG TERM GOAL #6   Title Pt will improve ability to fasten buttons as shown by being able to fasten/unfasten 3 buttons in less than 60 secs or less    Time 12    Period Weeks    Status New                   Plan - 12/19/20 0917     Clinical Impression Statement Pt continuing to progress towards goals with increased strength and coordination with RUE.    OT Occupational Profile and History Problem Focused Assessment - Including review of  records relating to presenting problem    Occupational performance deficits (Please refer to evaluation for details): ADL's;IADL's;Work;Leisure;Play;Rest and Sleep;Social Participation    Body Structure / Function / Physical Skills ADL;Flexibility;ROM;UE functional use;Decreased knowledge of use of DME;FMC;Balance;Dexterity;Gait;Sensation;GMC;Endurance;IADL;Coordination;Strength;Pain    Rehab Potential Good    Clinical Decision Making Several treatment options, min-mod task modification necessary     Comorbidities Affecting Occupational Performance: May have comorbidities impacting occupational performance    Modification or Assistance to Complete Evaluation  Min-Moderate modification of tasks or assist with assess necessary to complete eval    OT Frequency 2x / week   plus eval, anticipate d/c after 8 weeks dependent on progress.   OT Duration 12 weeks    OT Treatment/Interventions Self-care/ADL training;Ultrasound;Energy conservation;Patient/family education;DME and/or AE instruction;Paraffin;Passive range of motion;Balance training;Cryotherapy;Fluidtherapy    Plan typing, coordination, continue progressing towards goals.    Consulted and Agree with Plan of Care Patient             Patient will benefit from skilled therapeutic intervention in order to improve the following deficits and impairments:   Body Structure / Function / Physical Skills: ADL, Flexibility, ROM, UE functional use, Decreased knowledge of use of DME, FMC, Balance, Dexterity, Gait, Sensation, GMC, Endurance, IADL, Coordination, Strength, Pain       Visit Diagnosis: Muscle weakness (generalized)  Other abnormalities of gait and mobility  Other lack of coordination  Unsteadiness on feet  Other disturbances of skin sensation    Problem List Patient Active Problem List   Diagnosis Date Noted   Abscess of right foot 08/16/2020   Cellulitis of right foot 08/16/2020   Microhematuria 08/03/2020   Gastroesophageal reflux disease 07/07/2019   External otitis of right ear 03/09/2017   Thumb laceration, right, initial encounter 03/09/2017   Weakness of left side of body 11/19/2016   Stress due to illness of family member 06/10/2016   Syncope 04/23/2016   Onychomycosis 04/23/2016   Impacted ear wax 04/23/2016   Constipation due to opioid therapy 06/13/2014   Chronic pain syndrome 02/21/2014   Allergic reaction 02/20/2014   Neurofibromatosis, type 1 ([facility]) 12/14/2013   Status post cervical arthrodesis  11/21/2013   Cervical myelopathy ([facility]) 09/21/2013   Preventative health care 06/23/2010   HORNER'S SYNDROME 04/12/2009   Other psoriasis 08/24/2007   Tinea pedis 08/24/2007    Junious Dresser, OT 12/19/2020, 9:26 AM  [facility] [address] [place], [place], [address] Phone: [phone]   Fax:  [phone]  Name: Masud Holub MRN: [id number] Date of Birth: 1980/10/23

## 2020-12-20 NOTE — Therapy (Signed)
[facility] [address] [place], [place], [address] Phone: [phone]   Fax:  [phone]  Physical Therapy Treatment  Patient Details  Name: Larry Riley MRN: [id number] Date of Birth: 1980/02/03 Referring Provider (PT): Dr. Mindi Junker   Encounter Date: 12/19/2020   PT End of Session - 12/20/20 0848     Visit Number 17    Number of Visits 20    Date for PT Re-Evaluation 01/06/21    Authorization Type [employer] (60v max combined)    Authorization Time Period 11/22-12/26    Authorization - Visit Number 12    Authorization - Number of Visits 20    Progress Note Due on Visit 20    PT Start Time [phone]   pt arrived 69" late - thought appt was 8:15   PT Stop Time 0845    PT Time Calculation (min) 29 min    Activity Tolerance Patient tolerated treatment well    Behavior During Therapy [facility] for tasks assessed/performed             Past Medical History:  Diagnosis Date   GERD (gastroesophageal reflux disease) 01/21/2017   MRSA (methicillin resistant Staphylococcus aureus) 2005   leg   Neurofibromatosis    tumor down spine and brain    Past Surgical History:  Procedure Laterality Date   hydrocephalus     shunt 1999-[place]   NECK SURGERY      tumor removal   SPINE SURGERY  10/2013   c2-t2 fusion  , laminectomy c1-6-- Dr Raynald Kemp-- [place]   VASECTOMY  02/2013    There were no vitals filed for this visit.   Subjective Assessment - 12/19/20 0816     Subjective Pt reports he is having a little more pain in Rt shoulder and low back than he had on Tuesday this week, but states it is not too bad; going to [facility]    Pertinent History C2-T2 cervical fusion, neurofibromatosis, L foot drop    How long can you sit comfortably? >30 min    How long can you stand comfortably? >15 min    How long can you walk comfortably? >15 min    Patient Stated Goals Walk at least 1 mile, to get in and out of chair easier  without using arms too much,    Currently in Pain? Yes    Pain Score 4    shoulder and low back 4/10   Pain Location Shoulder    Pain Orientation Right    Pain Descriptors / Indicators Aching    Pain Type Chronic pain    Pain Onset More than a month ago    Pain Frequency Intermittent    Pain Onset More than a month ago             TherEx:  bridging x 5 reps with ball placed between knees for increased stability and to facilitate increased hip adduction  Bridging with hip abduction/adduction 5 reps Bridging with marching 5 reps Bridging with LE extension - each leg - 5 reps each leg (difficult to stay lifted when RLE extended due to Lt hip extensor weakness)  Sidelying Lt hip abduction - no weight - 10 reps  Sidelying Lt hip abduction  with hip flexion/extension - no weight - 10 reps  Lt hip extension control exercise off side of mat - 3# weight used - 10 reps   Prone position - Lt knee flexion/extension with 3# weight 10 reps  Lt hip extension - no weight - knee extended - 10 reps  Neuro Re-ed: Quadruped position - lifting opposite UE/LE 3 reps each side with attempted 5 sec hold Lt hip extension with knee flexed at 90 degrees - pushing foot upward 10 reps with min assist to maintain hip extension Lt knee flexion/extension with LLE lifted in quadruped position - 10 reps - min assist to keep leg lifted in hip extension  NeuroRe-ed:    SLS activity - pt performed cone taps (2 cones) with SBA - no UE support used Rockerboard EO - 10 reps without UE support Stepping forward/back from board to floor 10 reps each leg                                PT Short Term Goals - 12/20/20 0852       PT SHORT TERM GOAL #1   Title Patient will be able to ambulate 1050' without AD on level ground with SBA to improve short community ambulation    Baseline met 11/19/20    Time 4    Period Weeks    Status Achieved    Target Date 11/05/20       PT SHORT TERM GOAL #2   Title Pt will demo 5x sit to stand score <20 seconds without HHA to improve functional strength in bil LE    Baseline 11.41 secs without UE support 11/19/20    Time 4    Period Weeks    Status Achieved    Target Date 11/05/20      PT SHORT TERM GOAL #3   Title Patient will be able to ambulate 400 feet on grass without AD and SBA to improve safe community negotiation    Baseline met 11/19/20    Time 4    Period Weeks    Status Achieved    Target Date 11/05/20               PT Long Term Goals - 12/20/20 0852       PT LONG TERM GOAL #1   Title Pt will demo <12 seconds with 5x sit to stand without HHA to improve functional strength in bil LE; 12/02/20 5x STS in <10s    Baseline 11.41 11/19/20; 12/02/20 5x STS 10.6s    Time 8    Period Weeks    Status Revised    Target Date 01/06/21      PT LONG TERM GOAL #2   Title Pt will be able to ambulate 25 min without AD with walking program at home to improve walking endurance to access community    Baseline not attempted, walking program issued (10/08/20); 12/02/20 Has been walking 25-30 min at home with wife    Time 8    Period Weeks    Status Achieved    Target Date 01/06/21      PT LONG TERM GOAL #3   Title Patient will demo >20/30 on functional gait assessment to improve functional balance and gait    Baseline 9/30 (10/08/20); 12/02/20 FGA 18/30    Time 8    Period Weeks    Status On-going    Target Date 01/06/21      PT LONG TERM GOAL #4   Title Patient to perform floor transfer with Mod I    Baseline TBD    Time 4    Period Weeks    Status New  Target Date 01/06/21                   Plan - 12/20/20 0849     Clinical Impression Statement Pt had moderate difficulty maintaining balance in quadruped position, performing "bird dog" pose, indicative of decreased core stabilization.  Pt had more difficulty balancing on LLE with RLE extended with LUE lifted - due to Lt hip musc.  weakness.  Pt did well with SLS activity in standing with mild LOB but without UE support.  Cont with POC.    Personal Factors and Comorbidities Comorbidity 3+    Comorbidities Neurofibromatosis, hx of cervical fusions, chronic neck pain, back pain, carpal tunnel syndrome, L foot drop    Examination-Activity Limitations Bend;Carry;Dressing;Lift;Reach Overhead;Sit;Sleep;Squat;Stairs;Stand;Transfers;Bathing    Examination-Participation Restrictions Cleaning;Community Activity;Driving;Laundry;Meal Prep;Occupation;Shop;Yard Work    [employer] Evolving/Moderate complexity    Rehab Potential Good    PT Frequency 2x / week    PT Duration 4 weeks    PT Treatment/Interventions ADLs/Self Care Home Management;Cryotherapy;Electrical Stimulation;Moist Heat;Traction;Gait training;Stair training;Functional mobility training;Therapeutic activities;Therapeutic exercise;Balance training;Neuromuscular re-education;Patient/family education;Orthotic Fit/Training;Manual techniques;Scar mobilization;Passive range of motion;Dry needling;Energy conservation;Spinal Manipulations;Joint Manipulations    PT Next Visit Plan Continue high level balance, resisted gait with weight on LLE, treadmill training uphill, core as needed, reactionary strategies/stepping focus on step accuracy and balance, circuit training    PT Home Exercise Plan Walking program, [id number]    Consulted and Agree with Plan of Care Patient             Patient will benefit from skilled therapeutic intervention in order to improve the following deficits and impairments:  Abnormal gait, Decreased activity tolerance, Decreased balance, Decreased range of motion, Decreased mobility, Decreased endurance, Decreased skin integrity, Decreased strength, Difficulty walking, Increased fascial restricitons, Increased muscle spasms, Impaired flexibility, Impaired sensation, Impaired tone, Impaired UE functional use, Improper body mechanics,  Postural dysfunction, Pain  Visit Diagnosis: Muscle weakness (generalized)  Unsteadiness on feet     Problem List Patient Active Problem List   Diagnosis Date Noted   Abscess of right foot 08/16/2020   Cellulitis of right foot 08/16/2020   Microhematuria 08/03/2020   Gastroesophageal reflux disease 07/07/2019   External otitis of right ear 03/09/2017   Thumb laceration, right, initial encounter 03/09/2017   Weakness of left side of body 11/19/2016   Stress due to illness of family member 06/10/2016   Syncope 04/23/2016   Onychomycosis 04/23/2016   Impacted ear wax 04/23/2016   Constipation due to opioid therapy 06/13/2014   Chronic pain syndrome 02/21/2014   Allergic reaction 02/20/2014   Neurofibromatosis, type 1 ([place]) 12/14/2013   Status post cervical arthrodesis 11/21/2013   Cervical myelopathy ([place]) 09/21/2013   Preventative health care 06/23/2010   HORNER'S SYNDROME 04/12/2009   Other psoriasis 08/24/2007   Tinea pedis 08/24/2007    Alda Lea, PT 12/20/2020, 8:54 AM  [facility] [address] [place], [place], [address] Phone: [phone]   Fax:  [phone]  Name: Larry Riley MRN: [id number] Date of Birth: 03-31-1980

## 2020-12-24 ENCOUNTER — Encounter: Payer: Self-pay | Admitting: Occupational Therapy

## 2020-12-24 ENCOUNTER — Ambulatory Visit: Payer: Managed Care, Other (non HMO) | Admitting: Physical Therapy

## 2020-12-24 ENCOUNTER — Ambulatory Visit: Payer: Managed Care, Other (non HMO) | Admitting: Occupational Therapy

## 2020-12-24 ENCOUNTER — Other Ambulatory Visit: Payer: Self-pay

## 2020-12-24 DIAGNOSIS — M6281 Muscle weakness (generalized): Secondary | ICD-10-CM

## 2020-12-24 DIAGNOSIS — R29898 Other symptoms and signs involving the musculoskeletal system: Secondary | ICD-10-CM

## 2020-12-24 DIAGNOSIS — R2681 Unsteadiness on feet: Secondary | ICD-10-CM | POA: Diagnosis not present

## 2020-12-24 DIAGNOSIS — R278 Other lack of coordination: Secondary | ICD-10-CM

## 2020-12-24 DIAGNOSIS — R2689 Other abnormalities of gait and mobility: Secondary | ICD-10-CM

## 2020-12-24 NOTE — Therapy (Signed)
[facility] [address] [place], [place], [address] Phone: [phone]   Fax:  [phone]  Occupational Therapy Treatment  Patient Details  Name: Larry Riley MRN: [id number] Date of Birth: 1980-09-05 Referring Provider (OT): Dr. Rigoberto Noel   Encounter Date: 12/24/2020   OT End of Session - 12/24/20 1621     Visit Number 20    Number of Visits 25    Date for OT Re-Evaluation 01/01/21    Authorization Type cigna    Authorization - Visit Number 69    Authorization - Number of Visits 30    OT Start Time 9485    OT Stop Time 1700    OT Time Calculation (min) 43 min    Activity Tolerance Patient tolerated treatment well    Behavior During Therapy [facility] for tasks assessed/performed             Past Medical History:  Diagnosis Date   GERD (gastroesophageal reflux disease) 01/21/2017   MRSA (methicillin resistant Staphylococcus aureus) 2005   leg   Neurofibromatosis    tumor down spine and brain    Past Surgical History:  Procedure Laterality Date   hydrocephalus     [place]unt 1999[place]   NECK SURGERY      tumor removal   SPINE SURGERY  10/2013   c2-t2 fusion  , laminectomy c1-6-- Dr Rigoberto Noel-- [facility]   VASECTOMY  02/2013    There were no vitals filed for this visit.   Subjective Assessment - 12/24/20 1620     Subjective  Pain level went up a bit this weekend    Pertinent History Larry Riley is a 40 y.o. 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 Rigoberto Noel. PMH: Neurofibromatosis, type 1,   debulking of cervical spine tumor/Cervical athrodesis, Horners Syndrome, and hydrocephalus, numerous cervical surgeries over the years (6-7    Limitations no restrictions per MD now, progress at pt's own comfort level    Currently in Pain? Yes    Pain Score 4     Pain Location Neck    Pain Orientation Right    Pain Descriptors / Indicators Aching;Tightness    Pain Type Chronic pain     Pain Onset More than a month ago    Pain Frequency Constant             Reviewed and checked some of the goals. Pt has met button goal and 9 hole peg test goal.  Typing Games clouds with fair accuracy and speed, Typing test with 18 wpm net speed.  Pt has opted out of rescheduling last appt - verbalized understanding of scheduling.  Sent note with patient re: accommodations for keyboard and mouse to MD and/or employer.                     OT Education - 12/24/20 1623     Education Details pt verbalized understanding of last appt and scheduling and verbalized not needing another visit next week.    Person(s) Educated Patient    Methods Explanation    Comprehension Verbalized understanding              OT Short Term Goals - 12/24/20 1647       OT SHORT TERM GOAL #1   Title Pt will be I with HEP -11/20/20    Time 6    Period Weeks    Status Achieved    Target Date 11/20/20  OT SHORT TERM GOAL #2   Title Pt will demonstrate improved coordination for ADLs as evidenced by placing 9 pegs in 2 mins  or less for RUE    Baseline 6 pegs placed in 2 mins    Time 6    Period Weeks    Status Achieved   1 min 38 secs     OT SHORT TERM GOAL #3   Title Pt will perform bathing and dressing with min A    Time 6    Period Weeks    Status Achieved      OT SHORT TERM GOAL #4   Title Pt will demonstrate understaning of adapted strategies/ AE to assist with ADLS (such as feeding, cutting food, using computer mouse.)    Time 6    Period Weeks    Status On-going      OT SHORT TERM GOAL #5   Title Pt will demonstrate improved coordination for ADLs as evidenced by decreasing 9 hole peg test to 50 secs or less for LUE    Baseline LUE 55.72 secs    Time 6    Period Weeks    Status Achieved   50.82, 40.19     OT SHORT TERM GOAL #6   Title Pt will demonstrate improved functional use of RUE as evidenced by increasing box/ blocks score to 37 blocks or greater.     Time 6    Period Weeks    Status On-going   12/24/20 - 33 blocks              OT Long Term Goals - 12/24/20 1634       OT LONG TERM GOAL #1   Title Pt will be I with upgraded HEP prn-01/02/20    Time 12    Period Weeks    Status On-going      OT LONG TERM GOAL #2   Title Pt will demonstrate improved coordination for ADLs as evidenced by completing 9 hole peg test in 90 secs or less with RUE.    Baseline 6 pegs placed in 2 mins    Time 12    Period Weeks    Status Achieved   12/24/20 RUE 83.37s     OT LONG TERM GOAL #3   Title Pt will perfrorm all basic ADLs modified indpendently    Time 12    Period Weeks    Status Achieved      OT LONG TERM GOAL #4   Title Patient will be able to type with both hands within reasonable period of time in preparation for work activities.    Time 12    Period Weeks    Status On-going   typing test with net speed 18 wpm using both hands 12/24/20     OT LONG TERM GOAL #5   Title Pt will perform basic home management and cooking modified independently    Time 12    Period Weeks    Status On-going   reporting making eggs, potatoes and steak on the weekends 12/24/20     OT LONG TERM GOAL #6   Title Pt will improve ability to fasten buttons as shown by being able to fasten/unfasten 3 buttons in less than 60 secs or less    Time 12    Period Weeks    Status Achieved   12/24/20 - 50 s                  Plan -  12/24/20 1633     Clinical Impression Statement Pt plan to discharge next session.    OT Occupational Profile and History Problem Focused Assessment - Including review of records relating to presenting problem    Occupational performance deficits (Please refer to evaluation for details): ADL's;IADL's;Work;Leisure;Play;Rest and Sleep;Social Participation    Body Structure / Function / Physical Skills ADL;Flexibility;ROM;UE functional use;Decreased knowledge of use of  DME;FMC;Balance;Dexterity;Gait;Sensation;GMC;Endurance;IADL;Coordination;Strength;Pain    Rehab Potential Good    Clinical Decision Making Several treatment options, min-mod task modification necessary    Comorbidities Affecting Occupational Performance: May have comorbidities impacting occupational performance    Modification or Assistance to Complete Evaluation  Min-Moderate modification of tasks or assist with assess necessary to complete eval    OT Frequency 2x / week   plus eval, anticipate d/c after 8 weeks dependent on progress.   OT Duration 12 weeks    OT Treatment/Interventions Self-care/ADL training;Ultrasound;Energy conservation;Patient/family education;DME and/or AE instruction;Paraffin;Passive range of motion;Balance training;Cryotherapy;Fluidtherapy    Plan typing, coordination, continue progressing towards goals.    Consulted and Agree with Plan of Care Patient             Patient will benefit from skilled therapeutic intervention in order to improve the following deficits and impairments:   Body Structure / Function / Physical Skills: ADL, Flexibility, ROM, UE functional use, Decreased knowledge of use of DME, FMC, Balance, Dexterity, Gait, Sensation, GMC, Endurance, IADL, Coordination, Strength, Pain       Visit Diagnosis: Muscle weakness (generalized)  Other abnormalities of gait and mobility  Other lack of coordination  Unsteadiness on feet  Other symptoms and signs involving the musculoskeletal system    Problem List Patient Active Problem List   Diagnosis Date Noted   Abscess of right foot 08/16/2020   Cellulitis of right foot 08/16/2020   Microhematuria 08/03/2020   Gastroesophageal reflux disease 07/07/2019   External otitis of right ear 03/09/2017   Thumb laceration, right, initial encounter 03/09/2017   Weakness of left side of body 11/19/2016   Stress due to illness of family member 06/10/2016   Syncope 04/23/2016   Onychomycosis 04/23/2016    Impacted ear wax 04/23/2016   Constipation due to opioid therapy 06/13/2014   Chronic pain syndrome 02/21/2014   Allergic reaction 02/20/2014   Neurofibromatosis, type 1 ([place]) 12/14/2013   Status post cervical arthrodesis 11/21/2013   Cervical myelopathy ([place]) 09/21/2013   Preventative health care 06/23/2010   HORNER'S SYNDROME 04/12/2009   Other psoriasis 08/24/2007   Tinea pedis 08/24/2007    Zachery Conch, OT 12/24/2020, 4:53 PM  [facility] [address] [place], [place], [address] Phone: [phone]   Fax:  [phone]  Name: Larry Riley MRN: [id number] Date of Birth: [birth year]

## 2020-12-24 NOTE — Patient Instructions (Signed)
To whom it may concern,  Larry Riley has been seen at our clinic for occupational therapy. He has made progress but would still benefit from adaptive equipment for work accommodations. He would benefit from an adapted keyboard and adapted mouse to increase efficiency and comfort, decrease pain, and maintain ability to perform work duties. If you agree, will you consider approval of these items?  Thank you so much,  Kallie Edward, MOT, OTR/L   [email or web address] [phone] (phone) [phone] (fax)

## 2020-12-25 NOTE — Therapy (Signed)
[facility] [address] [place], [place], [address] Phone: [phone]   Fax:  [phone]  Physical Therapy Treatment  Patient Details  Name: Larry Riley MRN: [id number] Date of Birth: 1980/04/19 Referring Provider (PT): Dr. Gaspar Cola   Encounter Date: 12/24/2020   PT End of Session - 12/25/20 1559     Visit Number 18    Number of Visits 20    Date for PT Re-Evaluation 01/06/21    Authorization - Visit Number 50    Authorization - Number of Visits 20    PT Start Time 4503    PT Stop Time 8882    PT Time Calculation (min) 40 min             Past Medical History:  Diagnosis Date   GERD (gastroesophageal reflux disease) 01/21/2017   MRSA (methicillin resistant Staphylococcus aureus) 2005   leg   Neurofibromatosis    tumor down spine and brain    Past Surgical History:  Procedure Laterality Date   hydrocephalus     shunt 1999-[place]   NECK SURGERY      tumor removal   SPINE SURGERY  10/2013   c2-t2 fusion  , laminectomy c1-6-- Dr Rigoberto Noel-- [facility]   VASECTOMY  02/2013    There were no vitals filed for this visit.   Subjective Assessment - 12/24/20 1544     Subjective Pt reports he is having a little more pain in Rt shoulder and low back than he had on Tuesday this week, but states it is not too bad; going to [facility]    Pertinent History C2-T2 cervical fusion, neurofibromatosis, L foot drop    How long can you sit comfortably? >30 min    How long can you stand comfortably? >15 min    How long can you walk comfortably? >15 min    Patient Stated Goals Walk at least 1 mile, to get in and out of chair easier without using arms too much,    Currently in Pain? Yes    Pain Score 4     Pain Location Back    Pain Orientation Right    Pain Descriptors / Indicators Aching;Tightness    Pain Type Chronic pain    Pain Onset More than a month ago    Pain Frequency Constant    Pain Onset More  than a month ago                  TherEx:  bridging x 5 reps with ball placed between knees for increased stability and to facilitate increased hip adduction  Bridging with hip abduction/adduction 5 reps Bridging with marching 5 reps Bridging with LE extension - each leg - 5 reps each leg (difficult to stay lifted when RLE extended due to Lt hip extensor weakness)  Sidelying Lt hip abduction - no weight - 10 reps  Sidelying Lt hip abduction  with hip flexion/extension - no weight - 10 reps  Lt hip extension control exercise off side of mat - 3# weight used - 10 reps   Prone position - Lt knee flexion/extension with 3# weight 10 reps                           Lt hip extension - no weight - knee extended - 10 reps  Neuro Re-ed: Quadruped position - lifting opposite UE/LE 3 reps each side with 3 sec hold Attempted to  perform Lt 1/2 kneeling position but Lt hamstring started cramping so pt returned to sitting to stretch out LLE   NeuroRe-ed:    SLS activity - pt performed cone taps (2 cones) with SBA - no UE support used Tap ups to 1st step 5 reps standing on floor; 5 reps each foot standing on Airex  with UE support on rails as needed  Pt performed tipping the 2 cones over and standing upright - 3 reps each LE with CGA for balance as needed                           PT Short Term Goals - 12/25/20 1602       PT SHORT TERM GOAL #1   Title Patient will be able to ambulate 1050' without AD on level ground with SBA to improve short community ambulation    Baseline met 11/19/20    Time 4    Period Weeks    Status Achieved    Target Date 11/05/20      PT SHORT TERM GOAL #2   Title Pt will demo 5x sit to stand score <20 seconds without HHA to improve functional strength in bil LE    Baseline 11.41 secs without UE support 11/19/20    Time 4    Period Weeks    Status Achieved    Target Date 11/05/20      PT SHORT TERM GOAL #3   Title Patient will be  able to ambulate 400 feet on grass without AD and SBA to improve safe community negotiation    Baseline met 11/19/20    Time 4    Period Weeks    Status Achieved    Target Date 11/05/20               PT Long Term Goals - 12/25/20 1602       PT LONG TERM GOAL #1   Title Pt will demo <12 seconds with 5x sit to stand without HHA to improve functional strength in bil LE; 12/02/20 5x STS in <10s    Baseline 11.41 11/19/20; 12/02/20 5x STS 10.6s    Time 8    Period Weeks    Status Revised    Target Date 01/06/21      PT LONG TERM GOAL #2   Title Pt will be able to ambulate 25 min without AD with walking program at home to improve walking endurance to access community    Baseline not attempted, walking program issued (10/08/20); 12/02/20 Has been walking 25-30 min at home with wife    Time 8    Period Weeks    Status Achieved    Target Date 01/06/21      PT LONG TERM GOAL #3   Title Patient will demo >20/30 on functional gait assessment to improve functional balance and gait    Baseline 9/30 (10/08/20); 12/02/20 FGA 18/30    Time 8    Period Weeks    Status On-going    Target Date 01/06/21      PT LONG TERM GOAL #4   Title Patient to perform floor transfer with Mod I    Baseline TBD    Time 4    Period Weeks    Status New    Target Date 01/06/21                   Plan - 12/25/20 1600  Clinical Impression Statement Skilled PT session focused on core strengthening, LLE strengthening and SLS activities for bil. LE's.  Pt had moderate difficulty maintaining balance in quadruped position (lifting opposite UE/LE) - able to hold for approx. 3 secs prior to LOB, indicative of decr. core strength.  Pt noted to have decreased SLS on LLE compared to RLE.  Cont with POC.    Personal Factors and Comorbidities Comorbidity 3+    Comorbidities Neurofibromatosis, hx of cervical fusions, chronic neck pain, back pain, carpal tunnel syndrome, L foot drop    Examination-Activity  Limitations Bend;Carry;Dressing;Lift;Reach Overhead;Sit;Sleep;Squat;Stairs;Stand;Transfers;Bathing    Examination-Participation Restrictions Cleaning;Community Activity;Driving;Laundry;Meal Prep;Occupation;Shop;Yard Work    [employer] officer Evolving/Moderate complexity    Rehab Potential Good    PT Frequency 2x / week    PT Duration 4 weeks    PT Treatment/Interventions ADLs/Self Care Home Management;Cryotherapy;Electrical Stimulation;Moist Heat;Traction;Gait training;Stair training;Functional mobility training;Therapeutic activities;Therapeutic exercise;Balance training;Neuromuscular re-education;Patient/family education;Orthotic Fit/Training;Manual techniques;Scar mobilization;Passive range of motion;Dry needling;Energy conservation;Spinal Manipulations;Joint Manipulations    PT Next Visit Plan Continue high level balance, resisted gait with weight on LLE, treadmill training uphill, core as needed, reactionary strategies/stepping focus on step accuracy and balance, circuit training    PT Home Exercise Plan Walking program, [id number]    Consulted and Agree with Plan of Care Patient             Patient will benefit from skilled therapeutic intervention in order to improve the following deficits and impairments:  Abnormal gait, Decreased activity tolerance, Decreased balance, Decreased range of motion, Decreased mobility, Decreased endurance, Decreased skin integrity, Decreased strength, Difficulty walking, Increased fascial restricitons, Increased muscle spasms, Impaired flexibility, Impaired sensation, Impaired tone, Impaired UE functional use, Improper body mechanics, Postural dysfunction, Pain  Visit Diagnosis: Muscle weakness (generalized)  Unsteadiness on feet     Problem List Patient Active Problem List   Diagnosis Date Noted   Abscess of right foot 08/16/2020   Cellulitis of right foot 08/16/2020   Microhematuria 08/03/2020   Gastroesophageal reflux disease  07/07/2019   External otitis of right ear 03/09/2017   Thumb laceration, right, initial encounter 03/09/2017   Weakness of left side of body 11/19/2016   Stress due to illness of family member 06/10/2016   Syncope 04/23/2016   Onychomycosis 04/23/2016   Impacted ear wax 04/23/2016   Constipation due to opioid therapy 06/13/2014   Chronic pain syndrome 02/21/2014   Allergic reaction 02/20/2014   Neurofibromatosis, type 1 ([place]) 12/14/2013   Status post cervical arthrodesis 11/21/2013   Cervical myelopathy ([place]) 09/21/2013   Preventative health care 06/23/2010   HORNER'S SYNDROME 04/12/2009   Other psoriasis 08/24/2007   Tinea pedis 08/24/2007    Alda Lea, PT 12/25/2020, 4:03 PM  [facility] [address] [place], [place], [address] Phone: [phone]   Fax:  [phone]  Name: Gahel Safley MRN: [id number] Date of Birth: December 05, 1980

## 2020-12-26 ENCOUNTER — Encounter: Payer: Self-pay | Admitting: Occupational Therapy

## 2020-12-26 ENCOUNTER — Ambulatory Visit: Payer: Managed Care, Other (non HMO) | Admitting: Physical Therapy

## 2020-12-26 ENCOUNTER — Other Ambulatory Visit: Payer: Self-pay

## 2020-12-26 ENCOUNTER — Ambulatory Visit: Payer: Managed Care, Other (non HMO) | Admitting: Occupational Therapy

## 2020-12-26 DIAGNOSIS — R2681 Unsteadiness on feet: Secondary | ICD-10-CM

## 2020-12-26 DIAGNOSIS — R2689 Other abnormalities of gait and mobility: Secondary | ICD-10-CM

## 2020-12-26 DIAGNOSIS — R208 Other disturbances of skin sensation: Secondary | ICD-10-CM

## 2020-12-26 DIAGNOSIS — M6281 Muscle weakness (generalized): Secondary | ICD-10-CM

## 2020-12-26 DIAGNOSIS — R278 Other lack of coordination: Secondary | ICD-10-CM

## 2020-12-26 DIAGNOSIS — R29898 Other symptoms and signs involving the musculoskeletal system: Secondary | ICD-10-CM

## 2020-12-26 NOTE — Therapy (Signed)
[facility] [address] [place], [place], [address] Phone: [phone]   Fax:  [phone]  Physical Therapy Treatment  Patient Details  Name: Larry Riley MRN: [id number] Date of Birth: 07/01/1980 Referring Provider (PT): Dr. Gaspar Cola   Encounter Date: 12/26/2020   PT End of Session - 12/26/20 1131     Visit Number 19    Number of Visits 20    Date for PT Re-Evaluation 01/06/21    Authorization Type [employer] (60v max combined)    Authorization - Visit Number 14    Authorization - Number of Visits 20    PT Start Time 0720    PT Stop Time 0800    PT Time Calculation (min) 40 min    Activity Tolerance Patient tolerated treatment well    Behavior During Therapy [facility] for tasks assessed/performed             Past Medical History:  Diagnosis Date   GERD (gastroesophageal reflux disease) 01/21/2017   MRSA (methicillin resistant Staphylococcus aureus) 2005   leg   Neurofibromatosis    tumor down spine and brain    Past Surgical History:  Procedure Laterality Date   hydrocephalus     shunt 1999-[place]   NECK SURGERY      tumor removal   SPINE SURGERY  10/2013   c2-t2 fusion  , laminectomy c1-6-- Dr Rigoberto Noel-- [facility]   VASECTOMY  02/2013    There were no vitals filed for this visit.   Subjective Assessment - 12/26/20 0722     Subjective Pt reports he can get up and down from the floor with UE support without difficulty; felt good to work his LE and core last session.  Plans to finish up with therapy after the next two visits.  Before weather became colder, pt has been walking 3/4 of a mile around the neighborhood, 25 minutes overall.    Pertinent History C2-T2 cervical fusion, neurofibromatosis, L foot drop    How long can you sit comfortably? >30 min    How long can you stand comfortably? >15 min    How long can you walk comfortably? >15 min    Patient Stated Goals Walk at least 1 mile, to get in and out of  chair easier without using arms too much,    Currently in Pain? Yes    Pain Score 4     Pain Location Neck    Pain Orientation Right    Pain Descriptors / Indicators Sore    Pain Type Chronic pain    Pain Onset More than a month ago    Pain Onset More than a month ago               [facility] Adult PT Treatment/Exercise - 12/26/20 0741       Ambulation/Gait   Ambulation/Gait Yes    Ambulation/Gait Assistance 5: Supervision    Ambulation/Gait Assistance Details resisted walking around gym with therapist providing manual resistance either constant or random in various directions back, laterally, diagonally or forwards to cause LOB with pt using stepping strategy to recover.      Exercises   Exercises Other Exercises    Other Exercises  Supine LE and core exercises: 1 set x 12 bridges each side with one foot on 4" block to shift weight laterally and bias activation of one LE over the other.  Placed bilat feet on phyisioball and performed small bridge with hamstring curl x 10 reps with therapist  assistance to keep ball stable.  Performed sustained hamstring curl with bilat LE and then performed 2 sets x 12 reps alternating LE raises off ball to focus on proximal hip and core stability.  Cues for breathing and activation of glutes instead of overuse of lumbar extensors.      Lumbar Exercises: Seated   Sit to Stand 10 reps;Limitations    Sit to Stand Limitations With R foot on block to bias weight over LLE; focus on slow controlled descent and coming to sit over 5-6 seconds with min A to keep weight shifted forwards over BOS                PT Education - 12/26/20 1131     Education Details plan to check goals next session and finalize HEP before D/C    Person(s) Educated Patient    Methods Explanation    Comprehension Verbalized understanding              PT Short Term Goals - 12/25/20 1602       PT SHORT TERM GOAL #1   Title Patient will be able to ambulate 1050'  without AD on level ground with SBA to improve short community ambulation    Baseline met 11/19/20    Time 4    Period Weeks    Status Achieved    Target Date 11/05/20      PT SHORT TERM GOAL #2   Title Pt will demo 5x sit to stand score <20 seconds without HHA to improve functional strength in bil LE    Baseline 11.41 secs without UE support 11/19/20    Time 4    Period Weeks    Status Achieved    Target Date 11/05/20      PT SHORT TERM GOAL #3   Title Patient will be able to ambulate 400 feet on grass without AD and SBA to improve safe community negotiation    Baseline met 11/19/20    Time 4    Period Weeks    Status Achieved    Target Date 11/05/20               PT Long Term Goals - 12/26/20 0722       PT LONG TERM GOAL #1   Title Pt will demo <12 seconds with 5x sit to stand without HHA to improve functional strength in bil LE; 12/02/20 5x STS in <10s    Baseline 11.41 11/19/20; 12/02/20 5x STS 10.6s    Time 8    Period Weeks    Status Revised    Target Date 01/06/21      PT LONG TERM GOAL #2   Title Pt will be able to ambulate 25 min without AD with walking program at home to improve walking endurance to access community    Baseline not attempted, walking program issued (10/08/20); 12/02/20 Has been walking 25-30 min at home with wife    Time 8    Period Weeks    Status Achieved    Target Date 01/06/21      PT LONG TERM GOAL #3   Title Patient will demo >20/30 on functional gait assessment to improve functional balance and gait    Baseline 9/30 (10/08/20); 12/02/20 FGA 18/30    Time 8    Period Weeks    Status On-going    Target Date 01/06/21      PT LONG TERM GOAL #4   Title Patient to perform floor transfer  with Mod I    Baseline Able to do MOD I with UE support    Time 4    Period Weeks    Status Achieved    Target Date 01/06/21                   Plan - 12/26/20 1132     Clinical Impression Statement Continued to focus on LLE and core  strength and stability training with use of physioball.  Continued higher level gait and balance training with use of resistance to provide external perturbations.  Plan to check goals next session and complete therapy after two more sessions.    Personal Factors and Comorbidities Comorbidity 3+    Comorbidities Neurofibromatosis, hx of cervical fusions, chronic neck pain, back pain, carpal tunnel syndrome, L foot drop    Examination-Activity Limitations Bend;Carry;Dressing;Lift;Reach Overhead;Sit;Sleep;Squat;Stairs;Stand;Transfers;Bathing    Examination-Participation Restrictions Cleaning;Community Activity;Driving;Laundry;Meal Prep;Occupation;Shop;Yard Work    [employer] officer Evolving/Moderate complexity    Rehab Potential Good    PT Frequency 2x / week    PT Duration 4 weeks    PT Treatment/Interventions ADLs/Self Care Home Management;Cryotherapy;Electrical Stimulation;Moist Heat;Traction;Gait training;Stair training;Functional mobility training;Therapeutic activities;Therapeutic exercise;Balance training;Neuromuscular re-education;Patient/family education;Orthotic Fit/Training;Manual techniques;Scar mobilization;Passive range of motion;Dry needling;Energy conservation;Spinal Manipulations;Joint Manipulations    PT Next Visit Plan check goals, finalize HEP, D/C on 12/22    PT Home Exercise Plan Walking program, [id number]    Consulted and Agree with Plan of Care Patient             Patient will benefit from skilled therapeutic intervention in order to improve the following deficits and impairments:  Abnormal gait, Decreased activity tolerance, Decreased balance, Decreased range of motion, Decreased mobility, Decreased endurance, Decreased skin integrity, Decreased strength, Difficulty walking, Increased fascial restricitons, Increased muscle spasms, Impaired flexibility, Impaired sensation, Impaired tone, Impaired UE functional use, Improper body mechanics, Postural  dysfunction, Pain  Visit Diagnosis: Muscle weakness (generalized)  Other abnormalities of gait and mobility  Unsteadiness on feet  Other symptoms and signs involving the musculoskeletal system     Problem List Patient Active Problem List   Diagnosis Date Noted   Abscess of right foot 08/16/2020   Cellulitis of right foot 08/16/2020   Microhematuria 08/03/2020   Gastroesophageal reflux disease 07/07/2019   External otitis of right ear 03/09/2017   Thumb laceration, right, initial encounter 03/09/2017   Weakness of left side of body 11/19/2016   Stress due to illness of family member 06/10/2016   Syncope 04/23/2016   Onychomycosis 04/23/2016   Impacted ear wax 04/23/2016   Constipation due to opioid therapy 06/13/2014   Chronic pain syndrome 02/21/2014   Allergic reaction 02/20/2014   Neurofibromatosis, type 1 ([place]) 12/14/2013   Status post cervical arthrodesis 11/21/2013   Cervical myelopathy ([place]) 09/21/2013   Preventative health care 06/23/2010   HORNER'S SYNDROME 04/12/2009   Other psoriasis 08/24/2007   Tinea pedis 08/24/2007    Rico Junker, PT, DPT 12/26/20    11:36 AM    [facility] [address] [place], [place], [address] Phone: [phone]   Fax:  [phone]  Name: Khameron Gruenwald MRN: [id number] Date of Birth: 1980/02/10

## 2020-12-27 NOTE — Therapy (Signed)
[facility] [address] [place], [place], [address] Phone: [phone]   Fax:  [phone]  Occupational Therapy Treatment  Patient Details  Name: Larry Riley MRN: [id number] Date of Birth: Jan 09, 1981 Referring Provider (OT): Dr. Rigoberto Noel   Encounter Date: 12/26/2020   OT End of Session - 12/27/20 0813     Visit Number 21    Number of Visits 25    Date for OT Re-Evaluation 01/01/21    Authorization Type cigna    Authorization - Visit Number 21    Authorization - Number of Visits 30    OT Start Time 0804    OT Stop Time 0830    OT Time Calculation (min) 26 min    Activity Tolerance Patient tolerated treatment well    Behavior During Therapy [facility] for tasks assessed/performed             Past Medical History:  Diagnosis Date   GERD (gastroesophageal reflux disease) 01/21/2017   MRSA (methicillin resistant Staphylococcus aureus) 2005   leg   Neurofibromatosis    tumor down spine and brain    Past Surgical History:  Procedure Laterality Date   hydrocephalus     [place]unt 1999[place]   NECK SURGERY      tumor removal   SPINE SURGERY  10/2013   c2-t2 fusion  , laminectomy c1-6-- Dr Rigoberto Noel-- [place]   VASECTOMY  02/2013    There were no vitals filed for this visit.   Subjective Assessment - 12/26/20 0815     Subjective  Pt reports pain is better after PT today    Pertinent History Larry Riley is a 40 y.o. 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 Rigoberto Noel. PMH: Neurofibromatosis, type 1,   debulking of cervical spine tumor/Cervical athrodesis, Horners Syndrome, and hydrocephalus, numerous cervical surgeries over the years (6-7    Limitations no restrictions per MD now, progress at pt's own comfort level    Currently in Pain? Yes    Pain Score 3     Pain Location Neck    Pain Orientation Right    Pain Descriptors / Indicators Aching    Pain Onset More than a month ago     Pain Frequency Constant    Aggravating Factors  activity, malpositioning    Pain Relieving Factors rest                       Treatment: Therapist checked progress towards goals.           OT Education - 12/27/20 0815     Education Details Upgraded previous theraband HEP to red , 10-15 reps each   Person(s) Educated Patient    Methods Explanation;Demonstration;Verbal cues    Comprehension Verbalized understanding;Returned demonstration              OT Short Term Goals - 12/26/20 0821       OT SHORT TERM GOAL #1   Title Pt will be I with HEP -11/20/20    Time 6    Period Weeks    Status Achieved    Target Date 11/20/20      OT SHORT TERM GOAL #2   Title Pt will demonstrate improved coordination for ADLs as evidenced by placing 9 pegs in 2 mins  or less for RUE    Baseline 6 pegs placed in 2 mins    Time 6    Period Weeks  Status Achieved   1 min 38 secs     OT SHORT TERM GOAL #3   Title Pt will perform bathing and dressing with min A    Time 6    Period Weeks    Status Achieved      OT SHORT TERM GOAL #4   Title Pt will demonstrate understaning of adapted strategies/ AE to assist with ADLS (such as feeding, cutting food, using computer mouse.)    Time 6    Period Weeks    Status Achieved      OT SHORT TERM GOAL #5   Title Pt will demonstrate improved coordination for ADLs as evidenced by decreasing 9 hole peg test to 50 secs or less for LUE    Baseline LUE 55.72 secs    Time 6    Period Weeks    Status Achieved   50.82, 40.19     OT SHORT TERM GOAL #6   Title Pt will demonstrate improved functional use of RUE as evidenced by increasing box/ blocks score to 37 blocks or greater.    Time 6    Period Weeks    Status Not Met   12/26/20 - 36 blocks, improved but not fully met due to numbness              OT Long Term Goals - 12/26/20 0825       OT LONG TERM GOAL #1   Title Pt will be I with upgraded HEP prn-01/02/20     Time 12    Period Weeks    Status Achieved      OT LONG TERM GOAL #2   Title Pt will demonstrate improved coordination for ADLs as evidenced by completing 9 hole peg test in 90 secs or less with RUE.    Baseline 6 pegs placed in 2 mins    Time 12    Period Weeks    Status Achieved   12/24/20 RUE 83.37s     OT LONG TERM GOAL #3   Title Pt will perfrorm all basic ADLs modified indpendently    Time 12    Period Weeks    Status Achieved      OT LONG TERM GOAL #4   Title Patient will be able to type with both hands within reasonable period of time in preparation for work activities.    Time 12    Period Weeks    Status Not Met   typing test with net speed 18 wpm using both hands 12/24/20, not fully met due to numbness in Outlook #5   Title Pt will perform basic home management and cooking modified independently    Time 12    Period Weeks    Status Achieved   reporting making eggs, potatoes and steak on the weekends 12/24/20     OT LONG TERM GOAL #6   Title Pt will improve ability to fasten buttons as shown by being able to fasten/unfasten 3 buttons in less than 60 secs or less    Time 12    Period Weeks    Status Achieved   12/24/20 - 50 s                  Plan - 12/26/20 0848     Clinical Impression Statement Pt has made good overall progress with strength and coordination. He did not fully achieve all goals due to the severity of deficits.  OT Occupational Profile and History Problem Focused Assessment - Including review of records relating to presenting problem    Occupational performance deficits (Please refer to evaluation for details): ADL's;IADL's;Work;Leisure;Play;Rest and Sleep;Social Participation    Body Structure / Function / Physical Skills ADL;Flexibility;ROM;UE functional use;Decreased knowledge of use of DME;FMC;Balance;Dexterity;Gait;Sensation;GMC;Endurance;IADL;Coordination;Strength;Pain    Rehab Potential Good    Clinical Decision  Making Several treatment options, min-mod task modification necessary    Comorbidities Affecting Occupational Performance: May have comorbidities impacting occupational performance    Modification or Assistance to Complete Evaluation  Min-Moderate modification of tasks or assist with assess necessary to complete eval    OT Frequency 2x / week   plus eval, anticipate d/c after 8 weeks dependent on progress.   OT Duration 12 weeks    OT Treatment/Interventions Self-care/ADL training;Ultrasound;Energy conservation;Patient/family education;DME and/or AE instruction;Paraffin;Passive range of motion;Balance training;Cryotherapy;Fluidtherapy    Plan d/c OT    Consulted and Agree with Plan of Care Patient             Patient will benefit from skilled therapeutic intervention in order to improve the following deficits and impairments:   Body Structure / Function / Physical Skills: ADL, Flexibility, ROM, UE functional use, Decreased knowledge of use of DME, FMC, Balance, Dexterity, Gait, Sensation, GMC, Endurance, IADL, Coordination, Strength, Pain       Visit Diagnosis: Muscle weakness (generalized)  Other lack of coordination  Unsteadiness on feet  Other symptoms and signs involving the musculoskeletal system  Other disturbances of skin sensation   OCCUPATIONAL THERAPY DISCHARGE SUMMARY    Current functional level related to goals / functional outcomes: Pt made good overall progress however he did not fully achieve goals due to severity of deficits.   Remaining deficits: Decreased strength, decreased coordination, pain,    Education / Equipment: T was instructed in HEP, equipment recommendations for work, and HEP. Pt verbalized understanding of all education.   Patient agrees to discharge. Patient goals were met. Patient is being discharged due to being pleased with the current functional level..    Problem List Patient Active Problem List   Diagnosis Date Noted   Abscess  of right foot 08/16/2020   Cellulitis of right foot 08/16/2020   Microhematuria 08/03/2020   Gastroesophageal reflux disease 07/07/2019   External otitis of right ear 03/09/2017   Thumb laceration, right, initial encounter 03/09/2017   Weakness of left side of body 11/19/2016   Stress due to illness of family member 06/10/2016   Syncope 04/23/2016   Onychomycosis 04/23/2016   Impacted ear wax 04/23/2016   Constipation due to opioid therapy 06/13/2014   Chronic pain syndrome 02/21/2014   Allergic reaction 02/20/2014   Neurofibromatosis, type 1 ([place]) 12/14/2013   Status post cervical arthrodesis 11/21/2013   Cervical myelopathy ([place]) 09/21/2013   Preventative health care 06/23/2010   HORNER'S SYNDROME 04/12/2009   Other psoriasis 08/24/2007   Tinea pedis 08/24/2007    Ellsie Violette, OT 12/27/2020, 9:13 AM  [facility] [address] [place], [place], [address] Phone: [phone]   Fax:  [phone]  Name: Larry Riley MRN: [id number] Date of Birth: August 25, 1980

## 2020-12-31 ENCOUNTER — Other Ambulatory Visit: Payer: Self-pay

## 2020-12-31 ENCOUNTER — Ambulatory Visit: Payer: Managed Care, Other (non HMO) | Admitting: Physical Therapy

## 2020-12-31 ENCOUNTER — Encounter: Payer: Managed Care, Other (non HMO) | Admitting: Occupational Therapy

## 2020-12-31 DIAGNOSIS — R278 Other lack of coordination: Secondary | ICD-10-CM

## 2020-12-31 DIAGNOSIS — M6281 Muscle weakness (generalized): Secondary | ICD-10-CM

## 2020-12-31 DIAGNOSIS — R2681 Unsteadiness on feet: Secondary | ICD-10-CM | POA: Diagnosis not present

## 2021-01-01 NOTE — Therapy (Signed)
[facility] [address] [place], [place], [address] Phone: [phone]   Fax:  [phone]  Physical Therapy Treatment  Patient Details  Name: Larry Riley MRN: [id number] Date of Birth: 1980-04-16 Referring Provider (PT): Dr. Gaspar Cola   Encounter Date: 12/31/2020   PT End of Session - 01/01/21 1644     Visit Number 20    Number of Visits 20    Date for PT Re-Evaluation 01/06/21    Authorization Type [employer] (60v max combined)    Authorization - Visit Number 15    Authorization - Number of Visits 20    PT Start Time 1535    PT Stop Time 1620    PT Time Calculation (min) 45 min    Activity Tolerance Patient tolerated treatment well    Behavior During Therapy [facility] for tasks assessed/performed             Past Medical History:  Diagnosis Date   GERD (gastroesophageal reflux disease) 01/21/2017   MRSA (methicillin resistant Staphylococcus aureus) 2005   leg   Neurofibromatosis    tumor down spine and brain    Past Surgical History:  Procedure Laterality Date   hydrocephalus     shunt 1999[place]   NECK SURGERY      tumor removal   SPINE SURGERY  10/2013   c2-t2 fusion  , laminectomy c1-6-- Dr Rigoberto Noel-- [place]   VASECTOMY  02/2013    There were no vitals filed for this visit.   Subjective Assessment - 01/01/21 1638     Subjective Pt's wife, [name], accompanying pt to PT - wants to know which exercises/machines he would be able to do at the gym after D/C from PT    Pertinent History C2-T2 cervical fusion, neurofibromatosis, L foot drop    How long can you sit comfortably? >30 min    How long can you stand comfortably? >15 min    How long can you walk comfortably? >15 min    Patient Stated Goals Walk at least 1 mile, to get in and out of chair easier without using arms too much,    Currently in Pain? No/denies    Pain Onset More than a month ago    Pain Onset More than a month ago                                [facility] Adult PT Treatment/Exercise - 01/01/21 0001       Exercises   Exercises Knee/Hip      Knee/Hip Exercises: Aerobic   Elliptical level 2.0 - 1" forward, 30 secs backward      Knee/Hip Exercises: Machines for Strengthening   Cybex Leg Press bil. LE's 90# 3 sets 10 reps:  LLE only 50# 5 reps, 60# 10 reps      Knee/Hip Exercises: Standing   Heel Raises Both;1 set;10 reps             Pt performed seated exercises on blue physioball - seated marching, knee extension with contralateral UE flexion 3 reps each Side with 5 sec hold  Roller placed under bil. Feet - pt performed knee extension/flexion (small ROM) with cues to sit erect - for improved core  Stabilization  Pt stood in inverted BOSU - placed in doorway for UE support bil. Pt performed weight shifts - anterior/posteriorly, laterally 10 reps each; small squats 10 reps with UE support  Placed one  foot in middle - moved other leg forward/back and then out to side 5 reps each direction- performed with each leg           PT Short Term Goals - 01/01/21 1644       PT SHORT TERM GOAL #1   Title Patient will be able to ambulate 1050' without AD on level ground with SBA to improve short community ambulation    Baseline met 11/19/20    Time 4    Period Weeks    Status Achieved    Target Date 11/05/20      PT SHORT TERM GOAL #2   Title Pt will demo 5x sit to stand score <20 seconds without HHA to improve functional strength in bil LE    Baseline 11.41 secs without UE support 11/19/20    Time 4    Period Weeks    Status Achieved    Target Date 11/05/20      PT SHORT TERM GOAL #3   Title Patient will be able to ambulate 400 feet on grass without AD and SBA to improve safe community negotiation    Baseline met 11/19/20    Time 4    Period Weeks    Status Achieved    Target Date 11/05/20               PT Long Term Goals - 01/01/21 1645       PT LONG TERM GOAL #1    Title Pt will demo <12 seconds with 5x sit to stand without HHA to improve functional strength in bil LE; 12/02/20 5x STS in <10s    Baseline 11.41 11/19/20; 12/02/20 5x STS 10.6s    Time 8    Period Weeks    Status Revised    Target Date 01/06/21      PT LONG TERM GOAL #2   Title Pt will be able to ambulate 25 min without AD with walking program at home to improve walking endurance to access community    Baseline not attempted, walking program issued (10/08/20); 12/02/20 Has been walking 25-30 min at home with wife    Time 8    Period Weeks    Status Achieved    Target Date 01/06/21      PT LONG TERM GOAL #3   Title Patient will demo >20/30 on functional gait assessment to improve functional balance and gait    Baseline 9/30 (10/08/20); 12/02/20 FGA 18/30    Time 8    Period Weeks    Status On-going    Target Date 01/06/21      PT LONG TERM GOAL #4   Title Patient to perform floor transfer with Mod I    Baseline Able to do MOD I with UE support    Time 4    Period Weeks    Status Achieved    Target Date 01/06/21                   Plan - 01/01/21 1645     Clinical Impression Statement PT session focused on strengthening exercises with use of machines that pt would be able to use at the gym after D/C from PT.  Pt able to perform elliptical 1" forward, 30 secs backward prior to fatigue, with exercise stopped after this time.  Pt able to safely use leg press and perform physioball exercises for core stabilization.  Cont with POC with D/C planned next visit.    Personal Factors  and Comorbidities Comorbidity 3+    Comorbidities Neurofibromatosis, hx of cervical fusions, chronic neck pain, back pain, carpal tunnel syndrome, L foot drop    Examination-Activity Limitations Bend;Carry;Dressing;Lift;Reach Overhead;Sit;Sleep;Squat;Stairs;Stand;Transfers;Bathing    Examination-Participation Restrictions Cleaning;Community Activity;Driving;Laundry;Meal Prep;Occupation;Shop;Yard Work     [employer] Evolving/Moderate complexity    Rehab Potential Good    PT Frequency 2x / week    PT Duration 4 weeks    PT Treatment/Interventions ADLs/Self Care Home Management;Cryotherapy;Electrical Stimulation;Moist Heat;Traction;Gait training;Stair training;Functional mobility training;Therapeutic activities;Therapeutic exercise;Balance training;Neuromuscular re-education;Patient/family education;Orthotic Fit/Training;Manual techniques;Scar mobilization;Passive range of motion;Dry needling;Energy conservation;Spinal Manipulations;Joint Manipulations    PT Next Visit Plan check goals, finalize HEP, D/C on 12/22    PT Home Exercise Plan Walking program, [id number]    Consulted and Agree with Plan of Care Patient             Patient will benefit from skilled therapeutic intervention in order to improve the following deficits and impairments:  Abnormal gait, Decreased activity tolerance, Decreased balance, Decreased range of motion, Decreased mobility, Decreased endurance, Decreased skin integrity, Decreased strength, Difficulty walking, Increased fascial restricitons, Increased muscle spasms, Impaired flexibility, Impaired sensation, Impaired tone, Impaired UE functional use, Improper body mechanics, Postural dysfunction, Pain  Visit Diagnosis: Muscle weakness (generalized)  Other lack of coordination     Problem List Patient Active Problem List   Diagnosis Date Noted   Abscess of right foot 08/16/2020   Cellulitis of right foot 08/16/2020   Microhematuria 08/03/2020   Gastroesophageal reflux disease 07/07/2019   External otitis of right ear 03/09/2017   Thumb laceration, right, initial encounter 03/09/2017   Weakness of left side of body 11/19/2016   Stress due to illness of family member 06/10/2016   Syncope 04/23/2016   Onychomycosis 04/23/2016   Impacted ear wax 04/23/2016   Constipation due to opioid therapy 06/13/2014   Chronic pain syndrome  02/21/2014   Allergic reaction 02/20/2014   Neurofibromatosis, type 1 ([place]) 12/14/2013   Status post cervical arthrodesis 11/21/2013   Cervical myelopathy ([place]) 09/21/2013   Preventative health care 06/23/2010   HORNER'S SYNDROME 04/12/2009   Other psoriasis 08/24/2007   Tinea pedis 08/24/2007    Alda Lea, PT 01/01/2021, 4:48 PM  [facility] [address] [place], [place], [address] Phone: [phone]   Fax:  [phone]  Name: Larry Riley MRN: [id number] Date of Birth: 29-Jul-1980

## 2021-01-02 ENCOUNTER — Ambulatory Visit: Payer: Managed Care, Other (non HMO) | Admitting: Physical Therapy

## 2021-01-02 ENCOUNTER — Other Ambulatory Visit: Payer: Self-pay

## 2021-01-02 ENCOUNTER — Encounter: Payer: Managed Care, Other (non HMO) | Admitting: Occupational Therapy

## 2021-01-02 DIAGNOSIS — R2681 Unsteadiness on feet: Secondary | ICD-10-CM | POA: Diagnosis not present

## 2021-01-02 DIAGNOSIS — M6281 Muscle weakness (generalized): Secondary | ICD-10-CM

## 2021-01-02 DIAGNOSIS — R2689 Other abnormalities of gait and mobility: Secondary | ICD-10-CM

## 2021-01-02 NOTE — Therapy (Signed)
[facility] °[address] °[place], [place], [address] °Phone: [phone]   Fax:  [phone] ° °Physical Therapy Treatment & Discharge Summary ° °Patient Details  °Name: Larry Riley °MRN: [id number] °Date of Birth: 12/11/1980 °Referring Provider (PT): Dr. Hamilton Hsu ° ° °Encounter Date: 01/02/2021 ° ° PT End of Session - 01/02/21 2001   ° ° Visit Number 21   ° Number of Visits 20   ° Date for PT Re-Evaluation 01/06/21   ° Authorization Type [employer] (60v max combined)   ° Authorization - Visit Number 16   ° Authorization - Number of Visits 20   ° PT Start Time 1532   ° PT Stop Time 1605   ° PT Time Calculation (min) 33 min   ° Activity Tolerance Patient tolerated treatment well   ° Behavior During Therapy WFL for tasks assessed/performed   ° °  °  ° °  ° ° °Past Medical History:  °Diagnosis Date  ° GERD (gastroesophageal reflux disease) 01/21/2017  ° MRSA (methicillin resistant Staphylococcus aureus) 2005  ° leg  ° Neurofibromatosis   ° tumor down spine and brain  ° ° °Past Surgical History:  °Procedure Laterality Date  ° hydrocephalus    ° shunt 1999-[place]  ° NECK SURGERY    °  tumor removal  ° SPINE SURGERY  10/2013  ° c2-t2 fusion  , laminectomy c1-6-- Dr HSU-- [facility]  ° VASECTOMY  02/2013  ° ° °There were no vitals filed for this visit. ° ° Subjective Assessment - 01/02/21 1529   ° ° Subjective Pt reports no changes since previous session on Tuesday this week; states he has made a lot of progress since his initial PT eval at end of September   ° Pertinent History C2-T2 cervical fusion, neurofibromatosis, L foot drop   ° How long can you sit comfortably? >30 min   ° How long can you stand comfortably? >15 min   ° How long can you walk comfortably? >15 min   ° Patient Stated Goals Walk at least 1 mile, to get in and out of chair easier without using arms too much,   ° Currently in Pain? No/denies   ° Pain Onset More than a month ago   ° Pain Onset More  than a month ago   ° °  °  ° °  ° ° ° ° ° [facility] PT Assessment - 01/02/21 1538   ° °  ° Functional Gait  Assessment  ° Gait assessed  Yes   ° Gait Level Surface Walks 20 ft in less than 5.5 sec, no assistive devices, good speed, no evidence for imbalance, normal gait pattern, deviates no more than 6 in outside of the 12 in walkway width.   4.6  ° Change in Gait Speed Able to smoothly change walking speed without loss of balance or gait deviation. Deviate no more than 6 in outside of the 12 in walkway width.   ° Gait with Horizontal Head Turns Performs head turns smoothly with slight change in gait velocity (eg, minor disruption to smooth gait path), deviates 6-10 in outside 12 in walkway width, or uses an assistive device.   ° Gait with Vertical Head Turns Performs head turns with no change in gait. Deviates no more than 6 in outside 12 in walkway width.   ° Gait and Pivot Turn Pivot turns safely within 3 sec and stops quickly with no loss of balance.   ° Step Over   Obstacle Is able to step over 2 stacked shoe boxes taped together (9 in total height) without changing gait speed. No evidence of imbalance.   ° Gait with Narrow Base of Support Ambulates less than 4 steps heel to toe or cannot perform without assistance.   ° Gait with Eyes Closed Walks 20 ft, uses assistive device, slower speed, mild gait deviations, deviates 6-10 in outside 12 in walkway width. Ambulates 20 ft in less than 9 sec but greater than 7 sec.   ° Ambulating Backwards Walks 20 ft, uses assistive device, slower speed, mild gait deviations, deviates 6-10 in outside 12 in walkway width.   ° Steps Alternating feet, must use rail.   ° Total Score 23   ° °  °  ° °  ° ° ° ° ° ° ° ° ° ° ° ° ° ° ° ° [facility] Adult PT Treatment/Exercise - 01/02/21 1542   ° °  ° Transfers  ° Transfers Sit to Stand;Stand to Sit   ° Sit to Stand 5: Supervision   ° Five time sit to stand comments  7.81   8.69 secs from chair; 7.81 secs from mat  °  ° Ambulation/Gait  ° Gait  velocity 9.22 secs = 3.56 ft/sec with no device   °  ° Standardized Balance Assessment  ° Standardized Balance Assessment Timed Up and Go Test   °  ° Timed Up and Go Test  ° TUG Normal TUG   ° Normal TUG (seconds) 7.4   ° °  °  ° °  ° ° ° ° ° ° ° ° ° ° PT Education - 01/02/21 1956   ° ° Education Details reviewed HEP, LTG's and progress towards goals   ° Person(s) Educated Patient   ° Methods Explanation   ° Comprehension Verbalized understanding   ° °  °  ° °  ° ° ° PT Short Term Goals - 01/02/21 2001   ° °  ° PT SHORT TERM GOAL #1  ° Title Patient will be able to ambulate 1050' without AD on level ground with SBA to improve short community ambulation   ° Baseline met 11/19/20   ° Time 4   ° Period Weeks   ° Status Achieved   ° Target Date 11/05/20   °  ° PT SHORT TERM GOAL #2  ° Title Pt will demo 5x sit to stand score <20 seconds without HHA to improve functional strength in bil LE   ° Baseline 11.41 secs without UE support 11/19/20   ° Time 4   ° Period Weeks   ° Status Achieved   ° Target Date 11/05/20   °  ° PT SHORT TERM GOAL #3  ° Title Patient will be able to ambulate 400 feet on grass without AD and SBA to improve safe community negotiation   ° Baseline met 11/19/20   ° Time 4   ° Period Weeks   ° Status Achieved   ° Target Date 11/05/20   ° °  °  ° °  ° ° ° ° PT Long Term Goals - 01/02/21 2002   ° °  ° PT LONG TERM GOAL #1  ° Title Pt will demo <12 seconds with 5x sit to stand without HHA to improve functional strength in bil LE; 12/02/20 5x STS in <10s   ° Baseline 11.41 11/19/20; 12/02/20 5x STS 10.6s;  7.81 secs from mat - 01-02-21   ° Time 8   °   Period Weeks   ° Status Achieved   ° Target Date 01/06/21   °  ° PT LONG TERM GOAL #2  ° Title Pt will be able to ambulate 25 min without AD with walking program at home to improve walking endurance to access community   ° Baseline not attempted, walking program issued (10/08/20); 12/02/20 Has been walking 25-30 min at home with wife   ° Time 8   ° Period Weeks   °  Status Achieved   ° Target Date 01/06/21   °  ° PT LONG TERM GOAL #3  ° Title Patient will demo >20/30 on functional gait assessment to improve functional balance and gait   ° Baseline 9/30 (10/08/20); 12/02/20 FGA 18/30; FGA score 23/30 on 01-02-21   ° Time 8   ° Period Weeks   ° Status Achieved   ° Target Date 01/06/21   °  ° PT LONG TERM GOAL #4  ° Title Patient to perform floor transfer with Mod I   ° Baseline Able to do MOD I with UE support   ° Time 4   ° Period Weeks   ° Status Achieved   ° Target Date 01/06/21   ° °  °  ° °  ° ° ° ° ° ° ° ° Plan - 01/02/21 2003   ° ° Clinical Impression Statement Pt has met 4/4 LTG's; FGA score has increased from 9/30 at eval to 23/30.  Pt has made excellent progress; D/C due to completion of program and no further needs identified.   ° Personal Factors and Comorbidities Comorbidity 3+   ° Comorbidities Neurofibromatosis, hx of cervical fusions, chronic neck pain, back pain, carpal tunnel syndrome, L foot drop   ° Examination-Activity Limitations Bend;Carry;Dressing;Lift;Reach Overhead;Sit;Sleep;Squat;Stairs;Stand;Transfers;Bathing   ° Examination-Participation Restrictions Cleaning;Community Activity;Driving;Laundry;Meal Prep;Occupation;Shop;Yard Work   ° Stability/Clinical Decision Making Evolving/Moderate complexity   ° Rehab Potential Good   ° PT Frequency 2x / week   ° PT Duration 4 weeks   ° PT Treatment/Interventions ADLs/Self Care Home Management;Cryotherapy;Electrical Stimulation;Moist Heat;Traction;Gait training;Stair training;Functional mobility training;Therapeutic activities;Therapeutic exercise;Balance training;Neuromuscular re-education;Patient/family education;Orthotic Fit/Training;Manual techniques;Scar mobilization;Passive range of motion;Dry needling;Energy conservation;Spinal Manipulations;Joint Manipulations   ° PT Next Visit Plan D/C on 12/22   ° PT Home Exercise Plan Walking program, [id number]   ° Consulted and Agree with Plan of Care Patient   ° °  °   ° °  ° ° °Patient will benefit from skilled therapeutic intervention in order to improve the following deficits and impairments:  Abnormal gait, Decreased activity tolerance, Decreased balance, Decreased range of motion, Decreased mobility, Decreased endurance, Decreased skin integrity, Decreased strength, Difficulty walking, Increased fascial restricitons, Increased muscle spasms, Impaired flexibility, Impaired sensation, Impaired tone, Impaired UE functional use, Improper body mechanics, Postural dysfunction, Pain ° °Visit Diagnosis: °Muscle weakness (generalized) ° °Unsteadiness on feet ° °Other abnormalities of gait and mobility ° ° ° ° °Problem List °Patient Active Problem List  ° Diagnosis Date Noted  ° Abscess of right foot 08/16/2020  ° Cellulitis of right foot 08/16/2020  ° Microhematuria 08/03/2020  ° Gastroesophageal reflux disease 07/07/2019  ° External otitis of right ear 03/09/2017  ° Thumb laceration, right, initial encounter 03/09/2017  ° Weakness of left side of body 11/19/2016  ° Stress due to illness of family member 06/10/2016  ° Syncope 04/23/2016  ° Onychomycosis 04/23/2016  ° Impacted ear wax 04/23/2016  ° Constipation due to opioid therapy 06/13/2014  ° Chronic pain syndrome 02/21/2014  ° Allergic reaction 02/20/2014  °   Neurofibromatosis, type 1 ([place]) 12/14/2013   Status post cervical arthrodesis 11/21/2013   Cervical myelopathy ([place]) 09/21/2013   Preventative health care 06/23/2010   HORNER'S SYNDROME 04/12/2009   Other psoriasis 08/24/2007   Tinea pedis 08/24/2007     PHYSICAL THERAPY DISCHARGE SUMMARY  Visits from Start of Care: 21  Current functional level related to goals / functional outcomes: See above for progress towards goals - pt has met 4/4 LTG's   Remaining deficits: Decreased high level balance skills and continued decreased high level gait; cont. LLE weakness (mild)   Education / Equipment: Pt has been instructed in a HEP for LLE strengthening and core  stabilization exercises  Patient agrees to discharge. Patient goals were met. Patient is being discharged due to meeting the stated rehab goals.   Alda Lea, PT 01/02/2021, 8:07 PM  [facility] [address] [place], [place], [address] Phone: [phone]   Fax:  [phone]  Name: Saige Busby MRN: [id number] Date of Birth: [birth year]

## 2021-01-15 ENCOUNTER — Other Ambulatory Visit: Payer: Self-pay | Admitting: Internal Medicine

## 2021-02-07 ENCOUNTER — Encounter: Payer: Self-pay | Admitting: Internal Medicine

## 2021-02-07 DIAGNOSIS — G894 Chronic pain syndrome: Secondary | ICD-10-CM

## 2021-02-07 MED ORDER — TRAMADOL HCL 50 MG PO TABS: ORAL_TABLET | ORAL | 2 refills | Status: DC

## 2021-05-21 ENCOUNTER — Other Ambulatory Visit: Payer: Self-pay

## 2021-05-21 MED ORDER — TIZANIDINE HCL 4 MG PO TABS: ORAL_TABLET | ORAL | 5 refills | Status: DC

## 2021-05-21 NOTE — Telephone Encounter (Signed)
Pharmacy requesting refill of Tizanidine  ?

## 2021-06-24 ENCOUNTER — Encounter: Payer: Self-pay | Admitting: Internal Medicine

## 2021-06-24 DIAGNOSIS — G894 Chronic pain syndrome: Secondary | ICD-10-CM

## 2021-06-25 MED ORDER — TRAMADOL HCL 50 MG PO TABS: ORAL_TABLET | ORAL | 2 refills | Status: DC

## 2021-06-25 NOTE — Telephone Encounter (Signed)
Patient requesting refill of Tramadol

## 2021-08-04 ENCOUNTER — Encounter: Payer: Self-pay | Admitting: Internal Medicine

## 2021-08-04 ENCOUNTER — Ambulatory Visit (INDEPENDENT_AMBULATORY_CARE_PROVIDER_SITE_OTHER): Payer: Managed Care, Other (non HMO) | Admitting: Internal Medicine

## 2021-08-04 VITALS — BP 110/60 | HR 76 | Temp 97.5°F | Ht 68.0 in | Wt 190.0 lb

## 2021-08-04 DIAGNOSIS — H9191 Unspecified hearing loss, right ear: Secondary | ICD-10-CM | POA: Diagnosis not present

## 2021-08-04 DIAGNOSIS — Z1159 Encounter for screening for other viral diseases: Secondary | ICD-10-CM

## 2021-08-04 DIAGNOSIS — R739 Hyperglycemia, unspecified: Secondary | ICD-10-CM

## 2021-08-04 DIAGNOSIS — K219 Gastro-esophageal reflux disease without esophagitis: Secondary | ICD-10-CM | POA: Diagnosis not present

## 2021-08-04 DIAGNOSIS — G894 Chronic pain syndrome: Secondary | ICD-10-CM

## 2021-08-04 DIAGNOSIS — Z0001 Encounter for general adult medical examination with abnormal findings: Secondary | ICD-10-CM

## 2021-08-04 NOTE — Assessment & Plan Note (Signed)
Also for lumbar surgury in the near future

## 2021-08-04 NOTE — Assessment & Plan Note (Signed)
Improved with irrigation,   Ceruminosis is noted.  Wax is removed by syringing and manual debridement. Instructions for home care to prevent wax buildup are given.  

## 2021-08-04 NOTE — Patient Instructions (Signed)
Your ear was irrigated today  Please continue all other medications as before, and refills have been done if requested.  Please have the pharmacy call with any other refills you may need.  Please continue your efforts at being more active, low cholesterol diet, and weight control.  You are otherwise up to date with prevention measures today.  Please keep your appointments with your specialists as you may have planned  Please have labs done at [facility] as you requested  Please make an Appointment to return for your 1 year visit, or sooner if needed, with Lab testing by Appointment as well, to be done about 3-5 days before at the FIRST FLOOR Lab (so this is for TWO appointments - please see the scheduling desk as you leave)

## 2021-08-04 NOTE — Assessment & Plan Note (Signed)
  Stable, pt to continue current medical treatment  - for a1c with labs at [facility]

## 2021-08-04 NOTE — Assessment & Plan Note (Signed)
Stable, cont current med tx - protonix 40 mg qd

## 2021-08-04 NOTE — Progress Notes (Signed)
Patient ID: Larry Riley, male   DOB: February 26, 1980, 41 y.o.   MRN: [id number]

## 2021-08-04 NOTE — Progress Notes (Signed)
Patient ID: Larry Riley, male   DOB: 1980-02-12, 41 y.o.   MRN: [id number]         Chief Complaint:: wellness exam and right hearing loss, hyperglycemia, gerd, chronic lbp       HPI:  Larry Riley is a 41 y.o. male here for wellness exam; due for hep c screen, o/w up to date                        Also Pt denies chest pain, increased sob or doe, wheezing, orthopnea, PND, increased LE swelling, palpitations, dizziness or syncope.   Pt denies polydipsia, polyuria, or new focal neuro s/s.    Pt denies fever, wt loss, night sweats, loss of appetite, or other constitutional symptoms  Pt continues to have recurring LBP without change in severity, bowel or bladder change, fever, wt loss,  worsening LE pain/numbness/weakness, gait change or falls, but may need lumbar surgury in the next year or two with worsening tumor activity.  Neck is much improved after surgury.  Has also ongoing chronic constipation better with linzess.  Also has 1 wk worsening right hearing loss without Ha, fever, sinus symptoms or tinnitus.     Wt Readings from Last 3 Encounters:  08/04/21 190 lb (86.2 kg)  08/16/20 190 lb (86.2 kg)  08/16/20 191 lb (86.6 kg)   BP Readings from Last 3 Encounters:  08/04/21 110/60  08/17/20 (!) 158/74  08/16/20 120/70   Immunization History  Administered Date(s) Administered   Influenza Inj Mdck Quad Pf 10/14/2018   Influenza,inj,Quad PF,6+ Mos 11/28/2012   Influenza-Unspecified 11/28/2012, 12/14/2013, 10/03/2016, 10/26/2016   PFIZER Comirnaty(Gray Top)Covid-19 Tri-Sucrose Vaccine 06/26/2019, 07/17/2019   PFIZER(Purple Top)SARS-COV-2 Vaccination 06/23/2019, 07/13/2019   Tdap 12/11/2011   Health Maintenance Due  Topic Date Due   Hepatitis C Screening  Never done      Past Medical History:  Diagnosis Date   GERD (gastroesophageal reflux disease) 01/21/2017   MRSA (methicillin resistant Staphylococcus aureus) 2005   leg   Neurofibromatosis    tumor down spine and brain   Past  Surgical History:  Procedure Laterality Date   hydrocephalus     shunt 1999-[place]   NECK SURGERY      tumor removal   SPINE SURGERY  10/2013   c2-t2 fusion  , laminectomy c1-6-- Dr Raynald Kemp-- [facility]   VASECTOMY  02/2013    reports that he has never smoked. He has never used smokeless tobacco. He reports current alcohol use of about 3.0 standard drinks of alcohol per week. He reports that he does not use drugs. family history includes Arthritis in his father, paternal grandfather, and paternal grandmother; Diabetes in his mother and paternal grandfather; Healthy in his maternal grandfather and maternal grandmother; Hyperlipidemia in his paternal grandfather; Hypertension in his father and paternal grandfather; Prostate cancer in his father and another family member. No Known Allergies Current Outpatient Medications on File Prior to Visit  Medication Sig Dispense Refill   acetaminophen (TYLENOL) 325 MG tablet Take 650 mg by mouth every 6 (six) hours as needed for mild pain, moderate pain, fever or headache.      bisacodyl (DULCOLAX) 5 MG EC tablet Take 5 mg by mouth daily as needed for mild constipation.      celecoxib (CELEBREX) 200 MG capsule TAKE 1 CAPSULE(200 MG) BY MOUTH TWICE DAILY 180 capsule 1   diphenhydrAMINE (BENADRYL) 25 mg capsule Take 25 mg by mouth at bedtime as needed for allergies or sleep.  guaiFENesin (MUCINEX) 600 MG 12 hr tablet Take 600 mg by mouth 2 (two) times daily as needed for cough or to loosen phlegm.     LINZESS 145 MCG CAPS capsule Take 145 mcg by mouth every morning.     pantoprazole (PROTONIX) 40 MG tablet TAKE 1 TABLET(40 MG) BY MOUTH DAILY 30 tablet 0   sildenafil (REVATIO) 20 MG tablet Take 1-5 tablets by mouth daily as needed. 50 tablet 5   tiZANidine (ZANAFLEX) 4 MG tablet TAKE 1 TABLET(4 MG) BY MOUTH EVERY 6 HOURS AS NEEDED FOR MUSCLE SPASMS 120 tablet 5   traMADol (ULTRAM) 50 MG tablet TAKE ONE TABLET BY MOUTH EVERY 8 HOURS AS NEEDED 90 tablet 2    EPINEPHrine (EPIPEN 2-PAK) 0.3 mg/0.3 mL IJ SOAJ injection Inject 0.3 mLs (0.3 mg total) into the muscle once. (Patient not taking: Reported on 08/04/2021) 1 Device 1   HYDROcodone-acetaminophen (NORCO/VICODIN) 5-325 MG tablet Take 1 tablet by mouth every 4 (four) hours as needed for moderate pain. (Patient not taking: Reported on 08/04/2021) 10 tablet 0   mupirocin ointment (BACTROBAN) 2 % Apply 1 application topically every other day. (Patient not taking: Reported on 08/04/2021) 15 g 0   terbinafine (LAMISIL) 250 MG tablet Take 1 tablet (250 mg total) by mouth daily. (Patient not taking: Reported on 08/04/2021) 90 tablet 0   terbinafine (LAMISIL) 250 MG tablet Please take one a day x 7days, repeat every 4 weeks x 4 months (Patient not taking: Reported on 08/04/2021) 28 tablet 0   No current facility-administered medications on file prior to visit.        ROS:  All others reviewed and negative.  Objective        PE:  BP 110/60 (BP Location: Right Arm, Patient Position: Sitting, Cuff Size: Large)   Pulse 76   Temp (!) 97.5 F (36.4 C) (Oral)   Ht 5\' 8"  (1.727 m)   Wt 190 lb (86.2 kg)   SpO2 98%   BMI 28.89 kg/m                 Constitutional: Pt appears in NAD               HENT: Head: NCAT.                Right Ear: External ear normal.  Right wax impaction resolved with irrigation, hearing improved               Left Ear: External ear normal.                Eyes: . Pupils are equal, round, and reactive to light. Conjunctivae and EOM are normal               Nose: without d/c or deformity               Neck: Neck supple. Gross normal ROM               Cardiovascular: Normal rate and regular rhythm.                 Pulmonary/Chest: Effort normal and breath sounds without rales or wheezing.                Abd:  Soft, NT, ND, + BS, no organomegaly               Neurological: Pt is alert. At baseline orientation, motor grossly intact  Skin: Skin is warm. No rashes, no other new  lesions, LE edema - none               Psychiatric: Pt behavior is normal without agitation   Micro: none  Cardiac tracings I have personally interpreted today:  none  Pertinent Radiological findings (summarize): none   Lab Results  Component Value Date   WBC 8.4 08/16/2020   HGB 13.7 08/16/2020   HCT 39.5 08/16/2020   PLT 437 (H) 08/16/2020   GLUCOSE 107 (H) 08/16/2020   CHOL 159 07/27/2019   TRIG 65 07/27/2019   HDL 48 07/27/2019   LDLCALC 98 07/27/2019   ALT 77 (H) 08/16/2020   AST 30 08/16/2020   NA 140 08/16/2020   K 4.1 08/16/2020   CL 104 08/16/2020   CREATININE 0.81 08/16/2020   BUN 20 08/16/2020   CO2 28 08/16/2020   TSH 3.25 06/13/2015   Assessment/Plan:  Larry Riley is a 41 y.o. White or Caucasian [1] male with  has a past medical history of GERD (gastroesophageal reflux disease) (01/21/2017), MRSA (methicillin resistant Staphylococcus aureus) (2005), and Neurofibromatosis.  Encounter for well adult exam with abnormal findings Age and sex appropriate education and counseling updated with regular exercise and diet Referrals for preventative services - for hep c screen Immunizations addressed - none needed Smoking counseling  - none needed Evidence for depression or other mood disorder - none significant Most recent labs reviewed. I have personally reviewed and have noted: 1) the patient's medical and social history 2) The patient's current medications and supplements 3) The patient's height, weight, and BMI have been recorded in the chart  Pt request s albs done at [facility]   Hyperglycemia  Stable, pt to continue current medical treatment  - for a1c with labs at [facility]   Gastroesophageal reflux disease Stable, cont current med tx - protonix 40 mg qd  Acute hearing loss, right Improved with irrigation,   Ceruminosis is noted.  Wax is removed by syringing and manual debridement. Instructions for home care to prevent wax buildup are given.  Chronic  pain syndrome Also for lumbar surgury in the near future  Followup: Return in about 1 year (around 08/05/2022).  Oliver Barre, MD 08/04/2021 8:11 PM [facility]  Primary Care - [facility]

## 2021-08-04 NOTE — Assessment & Plan Note (Addendum)
Age and sex appropriate education and counseling updated with regular exercise and diet Referrals for preventative services - for hep c screen Immunizations addressed - none needed Smoking counseling  - none needed Evidence for depression or other mood disorder - none significant Most recent labs reviewed. I have personally reviewed and have noted: 1) the patient's medical and social history 2) The patient's current medications and supplements 3) The patient's height, weight, and BMI have been recorded in the chart  Pt request s albs done at [facility]

## 2021-08-19 ENCOUNTER — Other Ambulatory Visit: Payer: Self-pay | Admitting: Internal Medicine

## 2021-08-20 ENCOUNTER — Other Ambulatory Visit: Payer: Self-pay | Admitting: Internal Medicine

## 2021-08-21 LAB — MICROSCOPIC EXAMINATION
Bacteria, UA: NONE SEEN
Casts: NONE SEEN /lpf
Epithelial Cells (non renal): NONE SEEN /hpf (ref 0–10)
RBC, Urine: >30 /hpf — AB (ref 0–2)
WBC, UA: NONE SEEN /hpf (ref 0–5)

## 2021-08-21 LAB — URINALYSIS, ROUTINE W REFLEX MICROSCOPIC
Bilirubin, UA: NEGATIVE
Glucose, UA: NEGATIVE
Ketones, UA: NEGATIVE
Leukocytes,UA: NEGATIVE
Nitrite, UA: NEGATIVE
Specific Gravity, UA: 1.028 (ref 1.005–1.030)
Urobilinogen, Ur: 0.2 mg/dL (ref 0.2–1.0)
pH, UA: 6 (ref 5.0–7.5)

## 2021-08-31 LAB — COMPREHENSIVE METABOLIC PANEL
ALT: 36 IU/L (ref 0–44)
AST: 28 IU/L (ref 0–40)
Albumin/Globulin Ratio: 2 (ref 1.2–2.2)
Albumin: 4.3 g/dL (ref 4.1–5.1)
Alkaline Phosphatase: 96 IU/L (ref 44–121)
BUN/Creatinine Ratio: 28 — ABNORMAL HIGH (ref 9–20)
BUN: 23 mg/dL (ref 6–24)
Bilirubin Total: 0.2 mg/dL (ref 0.0–1.2)
CO2: 21 mmol/L (ref 20–29)
Calcium: 9.5 mg/dL (ref 8.7–10.2)
Chloride: 106 mmol/L (ref 96–106)
Creatinine, Ser: 0.81 mg/dL (ref 0.76–1.27)
Globulin, Total: 2.2 g/dL (ref 1.5–4.5)
Glucose: 88 mg/dL (ref 70–99)
Potassium: 4.5 mmol/L (ref 3.5–5.2)
Sodium: 139 mmol/L (ref 134–144)
Total Protein: 6.5 g/dL (ref 6.0–8.5)
eGFR: 114 mL/min/{1.73_m2} (ref >59)

## 2021-08-31 LAB — CBC WITH DIFFERENTIAL/PLATELET
Basophils Absolute: 0.1 10*3/uL (ref 0.0–0.2)
Basos: 1 %
EOS (ABSOLUTE): 0.1 10*3/uL (ref 0.0–0.4)
Eos: 2 %
Hematocrit: 41.6 % (ref 37.5–51.0)
Hemoglobin: 14 g/dL (ref 13.0–17.7)
Immature Grans (Abs): 0 10*3/uL (ref 0.0–0.1)
Immature Granulocytes: 0 %
Lymphocytes Absolute: 1.9 10*3/uL (ref 0.7–3.1)
Lymphs: 25 %
MCH: 29.9 pg (ref 26.6–33.0)
MCHC: 33.7 g/dL (ref 31.5–35.7)
MCV: 89 fL (ref 79–97)
Monocytes Absolute: 0.6 10*3/uL (ref 0.1–0.9)
Monocytes: 8 %
Neutrophils Absolute: 4.7 10*3/uL (ref 1.4–7.0)
Neutrophils: 64 %
Platelets: 407 10*3/uL (ref 150–450)
RBC: 4.69 x10E6/uL (ref 4.14–5.80)
RDW: 13.3 % (ref 11.6–15.4)
WBC: 7.4 10*3/uL (ref 3.4–10.8)

## 2021-08-31 LAB — LIPID PANEL W/O CHOL/HDL RATIO
Cholesterol, Total: 152 mg/dL (ref 100–199)
HDL: 49 mg/dL
LDL Chol Calc (NIH): 70 mg/dL (ref 0–99)
Triglycerides: 202 mg/dL — ABNORMAL HIGH (ref 0–149)
VLDL Cholesterol Cal: 33 mg/dL (ref 5–40)

## 2021-08-31 LAB — URINALYSIS, ROUTINE W REFLEX MICROSCOPIC

## 2021-08-31 LAB — TSH: TSH: 1.44 u[IU]/mL (ref 0.450–4.500)

## 2021-08-31 LAB — SPECIMEN STATUS REPORT

## 2021-08-31 LAB — HGB A1C W/O EAG: Hgb A1c MFr Bld: 5.5 % (ref 4.8–5.6)

## 2021-09-17 ENCOUNTER — Other Ambulatory Visit: Payer: Self-pay

## 2021-09-17 IMAGING — DX DG FOOT COMPLETE 3+V*R*
2 series · 6 of 6 positions shown · non-contrast
Comparison: 08/16/2020.

CLINICAL DATA: 40-year-old male with possible foreign body in the
foot.

EXAM:
RIGHT FOOT COMPLETE - 3+ VIEW

[Series 1: foot · 0.14mm/px · 4 of 4 slices shown]
[im 1/4]
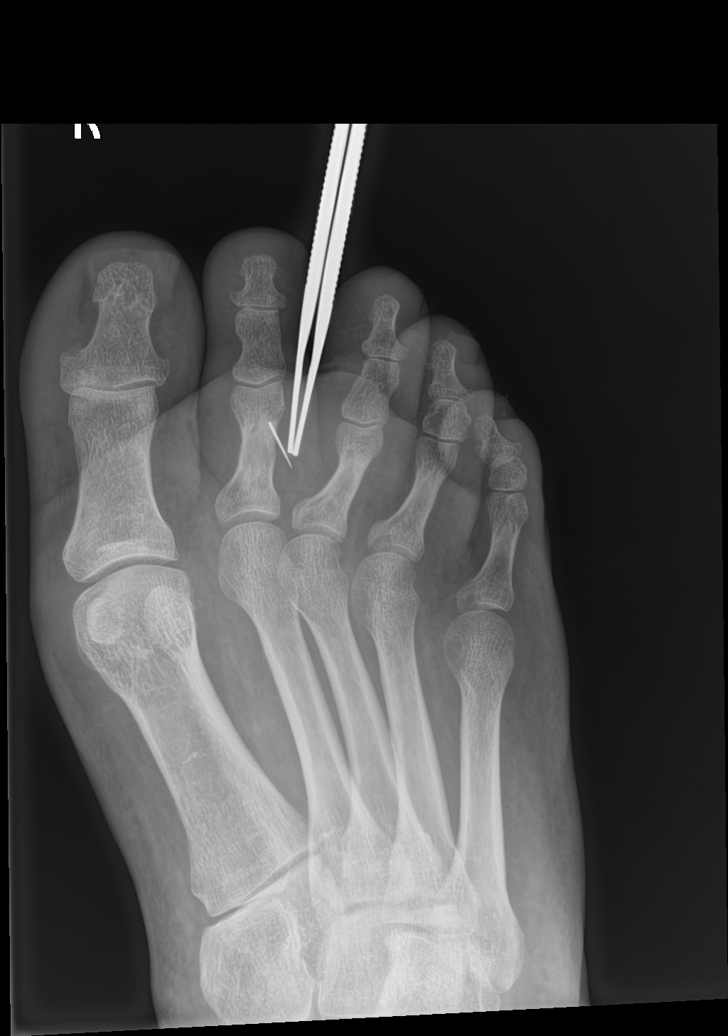
[im 2/4]
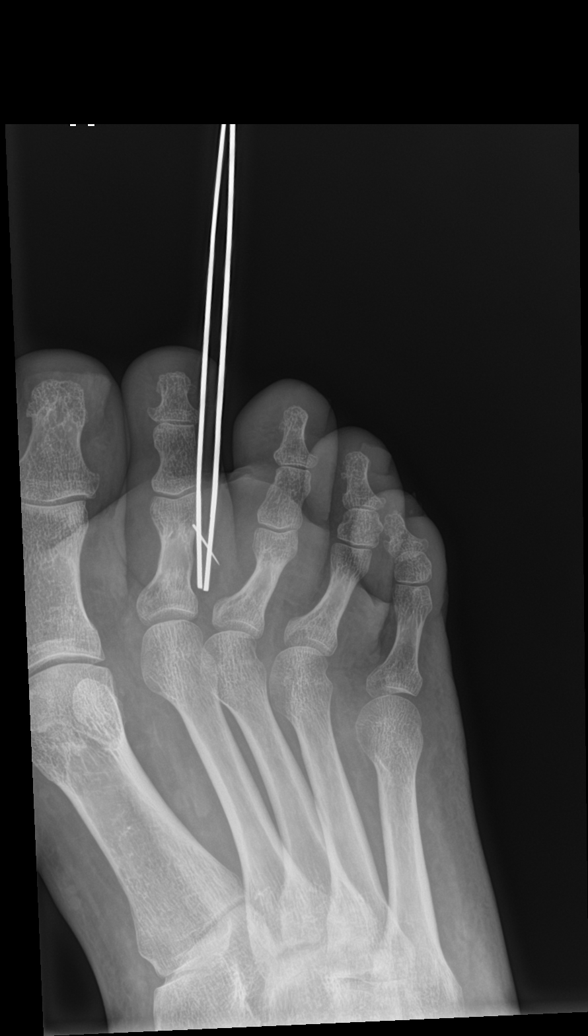
[im 3/4]
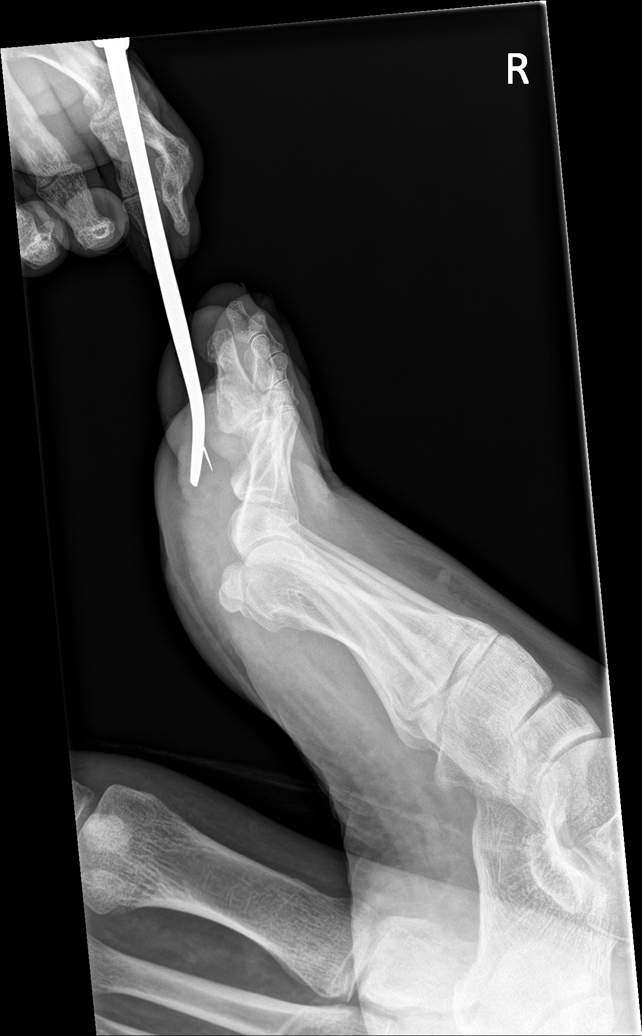
[im 4/4]
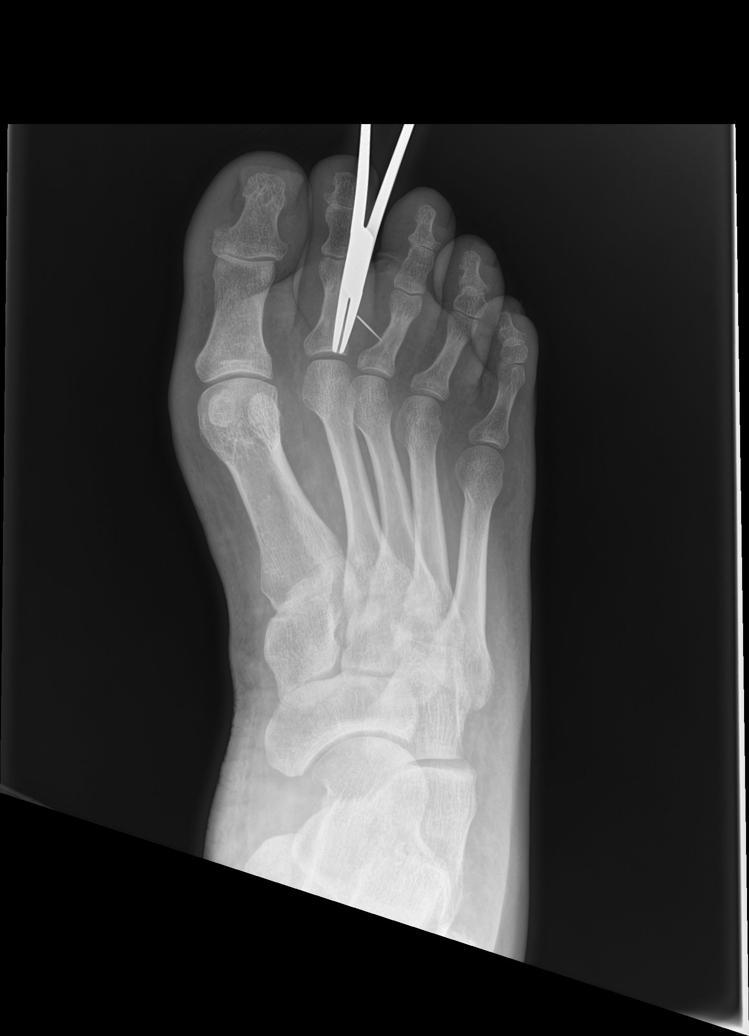

[Series 2: leg · 0.14mm/px · 2 of 2 slices shown]
[im 1/2]
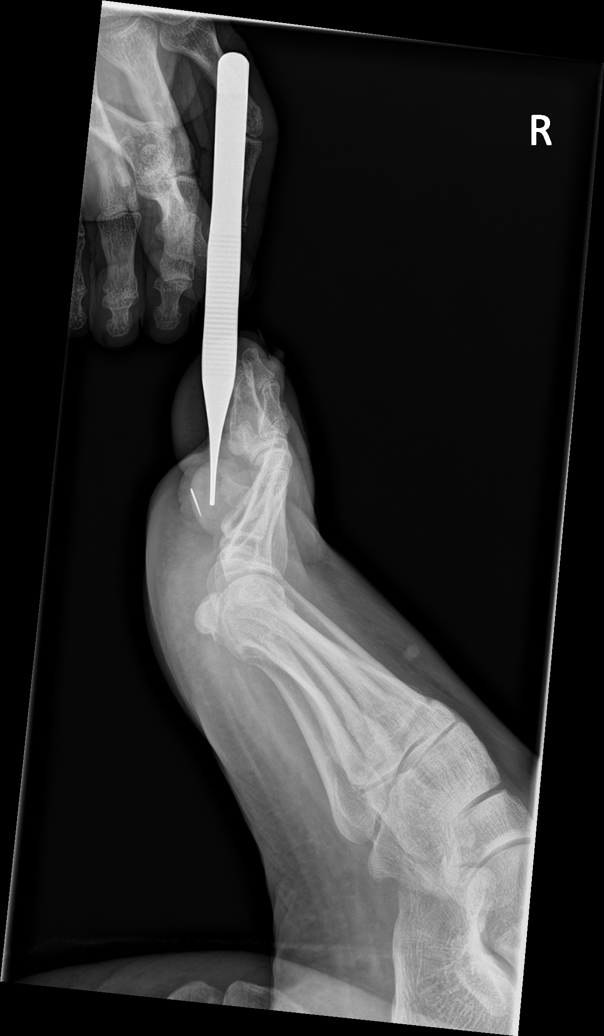
[im 2/2]
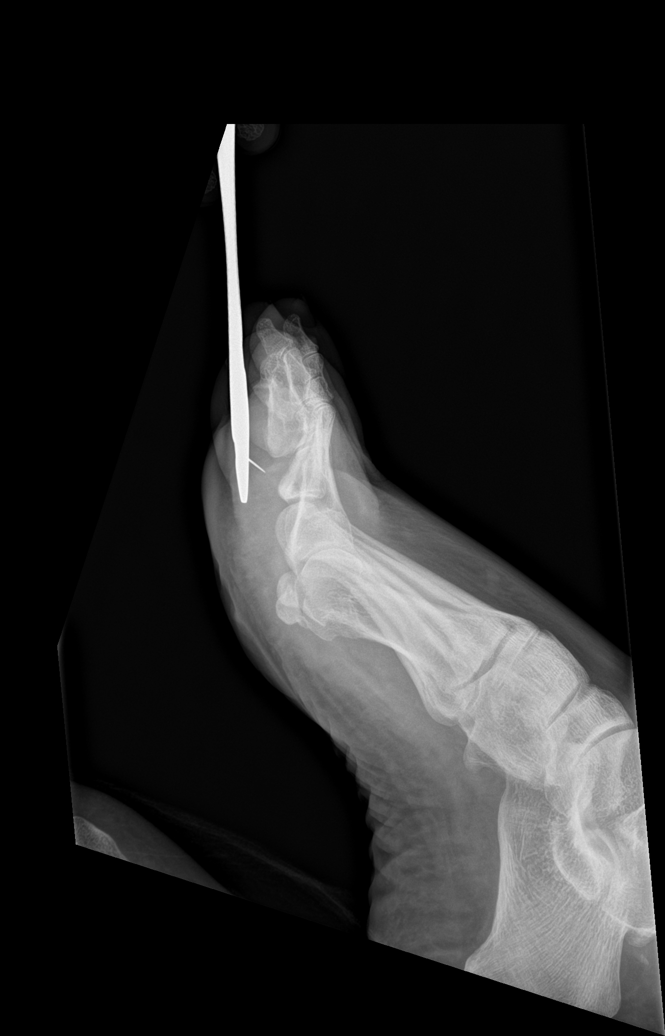

[6 of 6 positions shown; findings below may reference images not displayed]

FINDINGS: Multiple views of the right foot again demonstrate the presence of a
linear metallic foreign body in the soft tissues along the plantar
aspect of the foot adjacent to the second and third proximal
phalanges, with adjacent surgical probes in place on multiple
images.
IMPRESSION: 1. Retained linear metallic foreign body in the soft tissues of the
foot, as above, likely a broken fragment of a needle.

## 2021-09-17 MED ORDER — CELECOXIB 200 MG PO CAPS: ORAL_CAPSULE | ORAL | 2 refills | Status: DC

## 2021-11-05 ENCOUNTER — Encounter: Payer: Self-pay | Admitting: Internal Medicine

## 2021-11-05 DIAGNOSIS — G894 Chronic pain syndrome: Secondary | ICD-10-CM

## 2021-11-06 MED ORDER — TRAMADOL HCL 50 MG PO TABS: ORAL_TABLET | ORAL | 2 refills | Status: DC

## 2021-12-12 ENCOUNTER — Encounter: Payer: Self-pay | Admitting: Internal Medicine

## 2021-12-15 NOTE — Progress Notes (Unsigned)
MyChart Video Visit    Virtual Visit via Video Note   This visit type was conducted due to national recommendations for restrictions regarding the COVID-19 Pandemic (e.g. social distancing) in an effort to limit this patient's exposure and mitigate transmission in our community. This patient is at least at moderate risk for complications without adequate follow up. This format is felt to be most appropriate for this patient at this time. Physical exam was limited by quality of the video and audio technology used for the visit. CMA was able to get the patient set up on a video visit.  Patient location: Home. Patient and provider in visit Provider location: Office  I discussed the limitations of evaluation and management by telemedicine and the availability of in person appointments. The patient expressed understanding and agreed to proceed.  Visit Date: 12/16/2021  Today's healthcare provider: Hetty Blend, NP-C     Subjective:    Patient ID: Larry Riley, male    DOB: 1980-10-27, 41 y.o.   MRN: [id number]  No chief complaint on file.   HPI  Past Medical History:  Diagnosis Date   GERD (gastroesophageal reflux disease) 01/21/2017   MRSA (methicillin resistant Staphylococcus aureus) 2005   leg   Neurofibromatosis    tumor down spine and brain    Past Surgical History:  Procedure Laterality Date   hydrocephalus     shunt 1999-[place]   NECK SURGERY      tumor removal   SPINE SURGERY  10/2013   c2-t2 fusion  , laminectomy c1-6-- Dr Raynald Kemp-- [place]   VASECTOMY  02/2013    Family History  Problem Relation Age of Onset   Diabetes Mother    Arthritis Father    Hypertension Father    Prostate cancer Father    Arthritis Paternal Grandmother    Arthritis Paternal Grandfather    Diabetes Paternal Grandfather    Hyperlipidemia Paternal Grandfather    Hypertension Paternal Grandfather    Healthy Maternal Grandmother    Healthy Maternal Grandfather    Prostate cancer  Other     Social History   Socioeconomic History   Marital status: Married    Spouse name: Not on file   Number of children: 0   Years of education: 16   Highest education level: Not on file  Occupational History   Occupation: [employer]  Tobacco Use   Smoking status: Never   Smokeless tobacco: Never  Vaping Use   Vaping Use: Never used  Substance and Sexual Activity   Alcohol use: Yes    Alcohol/week: 3.0 standard drinks of alcohol    Types: 3 Glasses of wine per week   Drug use: No   Sexual activity: Never    Partners: Female  Other Topics Concern   Not on file  Social History Narrative   Fun: Scientist, physiological, fishing, hiking, skiing   Denies religious beliefs effecting health care.    Social Determinants of Health   Financial Resource Strain: Not on file  Food Insecurity: Not on file  Transportation Needs: Not on file  Physical Activity: Not on file  Stress: Not on file  Social Connections: Not on file  Intimate Partner Violence: Not on file    Outpatient Medications Prior to Visit  Medication Sig Dispense Refill   acetaminophen (TYLENOL) 325 MG tablet Take 650 mg by mouth every 6 (six) hours as needed for mild pain, moderate pain, fever or headache.      bisacodyl (DULCOLAX) 5 MG EC  tablet Take 5 mg by mouth daily as needed for mild constipation.      celecoxib (CELEBREX) 200 MG capsule TAKE 1 CAPSULE(200 MG) BY MOUTH TWICE DAILY 180 capsule 2   diphenhydrAMINE (BENADRYL) 25 mg capsule Take 25 mg by mouth at bedtime as needed for allergies or sleep.     EPINEPHrine (EPIPEN 2-PAK) 0.3 mg/0.3 mL IJ SOAJ injection Inject 0.3 mLs (0.3 mg total) into the muscle once. (Patient not taking: Reported on 08/04/2021) 1 Device 1   guaiFENesin (MUCINEX) 600 MG 12 hr tablet Take 600 mg by mouth 2 (two) times daily as needed for cough or to loosen phlegm.     HYDROcodone-acetaminophen (NORCO/VICODIN) 5-325 MG tablet Take 1 tablet by mouth every 4 (four) hours as needed for  moderate pain. (Patient not taking: Reported on 08/04/2021) 10 tablet 0   LINZESS 145 MCG CAPS capsule Take 145 mcg by mouth every morning.     mupirocin ointment (BACTROBAN) 2 % Apply 1 application topically every other day. (Patient not taking: Reported on 08/04/2021) 15 g 0   pantoprazole (PROTONIX) 40 MG tablet TAKE 1 TABLET(40 MG) BY MOUTH DAILY 30 tablet 0   sildenafil (REVATIO) 20 MG tablet Take 1-5 tablets by mouth daily as needed. 50 tablet 5   terbinafine (LAMISIL) 250 MG tablet Take 1 tablet (250 mg total) by mouth daily. (Patient not taking: Reported on 08/04/2021) 90 tablet 0   terbinafine (LAMISIL) 250 MG tablet Please take one a day x 7days, repeat every 4 weeks x 4 months (Patient not taking: Reported on 08/04/2021) 28 tablet 0   tiZANidine (ZANAFLEX) 4 MG tablet TAKE 1 TABLET(4 MG) BY MOUTH EVERY 6 HOURS AS NEEDED FOR MUSCLE SPASMS 120 tablet 5   traMADol (ULTRAM) 50 MG tablet TAKE ONE TABLET BY MOUTH EVERY 8 HOURS AS NEEDED 90 tablet 2   No facility-administered medications prior to visit.    No Known Allergies  ROS     Objective:    Physical Exam  There were no vitals taken for this visit. Wt Readings from Last 3 Encounters:  08/04/21 190 lb (86.2 kg)  08/16/20 190 lb (86.2 kg)  08/16/20 191 lb (86.6 kg)       Assessment & Plan:   Problem List Items Addressed This Visit   None   I am having Larry Kotyk Riley "[name]" maintain his acetaminophen, EPINEPHrine, bisacodyl, diphenhydrAMINE, guaiFENesin, terbinafine, Linzess, pantoprazole, HYDROcodone-acetaminophen, mupirocin ointment, sildenafil, terbinafine, tiZANidine, celecoxib, and traMADol.  No orders of the defined types were placed in this encounter.   I discussed the assessment and treatment plan with the patient. The patient was provided an opportunity to ask questions and all were answered. The patient agreed with the plan and demonstrated an understanding of the instructions.   The patient was advised to call  back or seek an in-person evaluation if the symptoms worsen or if the condition fails to improve as anticipated.  I provided *** minutes of face-to-face time during this encounter.   Harland Dingwall, NP-C [facility] [place] [phone] (phone) [phone] (fax)  [place]

## 2021-12-16 ENCOUNTER — Telehealth (INDEPENDENT_AMBULATORY_CARE_PROVIDER_SITE_OTHER): Payer: Managed Care, Other (non HMO) | Admitting: Family Medicine

## 2021-12-16 ENCOUNTER — Encounter: Payer: Self-pay | Admitting: Family Medicine

## 2021-12-16 DIAGNOSIS — J069 Acute upper respiratory infection, unspecified: Secondary | ICD-10-CM

## 2021-12-19 ENCOUNTER — Other Ambulatory Visit: Payer: Self-pay | Admitting: Internal Medicine

## 2022-02-04 ENCOUNTER — Ambulatory Visit: Payer: Managed Care, Other (non HMO) | Admitting: Internal Medicine

## 2022-02-04 VITALS — BP 108/62 | HR 68 | Temp 98.3°F | Ht 68.0 in | Wt 200.1 lb

## 2022-02-04 DIAGNOSIS — R739 Hyperglycemia, unspecified: Secondary | ICD-10-CM | POA: Diagnosis not present

## 2022-02-04 DIAGNOSIS — K219 Gastro-esophageal reflux disease without esophagitis: Secondary | ICD-10-CM

## 2022-02-04 DIAGNOSIS — G894 Chronic pain syndrome: Secondary | ICD-10-CM | POA: Diagnosis not present

## 2022-02-04 MED ORDER — GABAPENTIN 100 MG PO CAPS: 100.0000 mg | ORAL_CAPSULE | Freq: Three times a day (TID) | ORAL | 5 refills | Status: DC

## 2022-02-04 MED ORDER — TRAMADOL HCL 50 MG PO TABS: ORAL_TABLET | ORAL | 2 refills | Status: DC

## 2022-02-04 NOTE — Progress Notes (Unsigned)
    To see NS soon, may need LS spine

## 2022-02-04 NOTE — Patient Instructions (Signed)
Please take all new medication as prescribed  - the gabapentin  Please continue all other medications as before, and refills have been done if requested - tramadol  Please have the pharmacy call with any other refills you may need.  Please continue your efforts at being more active, low cholesterol diet, and weight control.  Please keep your appointments with your specialists as you may have planned  Please make an Appointment to return in 6 months, or sooner if needed

## 2022-02-05 ENCOUNTER — Encounter: Payer: Self-pay | Admitting: Internal Medicine

## 2022-02-05 NOTE — Assessment & Plan Note (Signed)
Stable overall, cont protonix 40 qd

## 2022-02-05 NOTE — Assessment & Plan Note (Signed)
?  Mild worsening recently, for contd tramadol prn, and add gabapentin 100 tid trial, f/u NS as planned with possible MRI LS spine

## 2022-02-05 NOTE — Assessment & Plan Note (Signed)
Lab Results  Component Value Date   HGBA1C 5.5 08/19/2021   Stable, pt to continue current medical treatment  - diet, wt control

## 2022-02-16 DIAGNOSIS — D492 Neoplasm of unspecified behavior of bone, soft tissue, and skin: Secondary | ICD-10-CM | POA: Insufficient documentation

## 2022-03-06 ENCOUNTER — Other Ambulatory Visit: Payer: Self-pay | Admitting: Internal Medicine

## 2022-03-06 ENCOUNTER — Encounter: Payer: Self-pay | Admitting: Internal Medicine

## 2022-03-06 ENCOUNTER — Other Ambulatory Visit: Payer: Self-pay

## 2022-03-06 ENCOUNTER — Other Ambulatory Visit: Payer: Self-pay | Admitting: Radiology

## 2022-03-06 MED ORDER — SILDENAFIL CITRATE 20 MG PO TABS: ORAL_TABLET | ORAL | 5 refills | Status: DC

## 2022-03-18 ENCOUNTER — Encounter: Payer: Self-pay | Admitting: Internal Medicine

## 2022-03-18 DIAGNOSIS — G894 Chronic pain syndrome: Secondary | ICD-10-CM

## 2022-03-19 MED ORDER — TRAMADOL HCL 50 MG PO TABS: ORAL_TABLET | ORAL | 2 refills | Status: DC

## 2022-04-23 DIAGNOSIS — D3610 Benign neoplasm of peripheral nerves and autonomic nervous system, unspecified: Secondary | ICD-10-CM | POA: Insufficient documentation

## 2022-04-30 ENCOUNTER — Telehealth: Payer: Self-pay

## 2022-04-30 NOTE — Transitions of Care (Post Inpatient/ED Visit) (Signed)
   04/30/2022  Name: Larry Riley MRN: [id number] DOB: [birth year]  Today's TOC FU Call Status: Today's TOC FU Call Status:: Unsuccessul Call (1st Attempt) Unsuccessful Call (1st Attempt) Date: 04/30/22  Attempted to reach the patient regarding the most recent Inpatient/ED visit.  Follow Up Plan: Additional outreach attempts will be made to reach the patient to complete the Transitions of Care (Post Inpatient/ED visit) call.   Signature Agnes Lawrence, CMA (AAMA)  [facility] [phone]

## 2022-05-13 NOTE — Transitions of Care (Post Inpatient/ED Visit) (Signed)
05/13/2022  Name: Costas Sena MRN: [id number] DOB: April 29, 1980  Today's TOC FU Call Status: Today's TOC FU Call Status:: Successful TOC FU Call Competed Unsuccessful Call (1st Attempt) Date: 04/30/22 [facility] FU Call Complete Date: 05/13/22  Transition Care Management Follow-up Telephone Call Date of Discharge: 04/29/22 Discharge Facility: Other [facility]) Name of Other ([facility]) [facility]charge Facility: [facility] Type of Discharge: Inpatient Admission Primary Inpatient Discharge Diagnosis:: Lumbar spine tumor How have you been since you were released from the hospital?: Better Any questions or concerns?: No  Items Reviewed: Did you receive and understand the discharge instructions provided?: Yes Medications obtained,verified, and reconciled?: Yes (Medications Reviewed) Any new allergies since your discharge?: No Dietary orders reviewed?: NA Do you have support at home?: Yes  Medications Reviewed Today: Medications Reviewed Today     Reviewed by Corwin Levins, MD (Physician) on 02/05/22 at 2058  Med List Status: <None>   Medication Order Taking? Sig Documenting Provider Last Dose Status Informant  acetaminophen (TYLENOL) 325 MG tablet [id number] Yes Take 650 mg by mouth every 6 (six) hours as needed for mild pain, moderate pain, fever or headache.  [provider] Taking Active Self  bisacodyl (DULCOLAX) 5 MG EC tablet [id number] Yes Take 5 mg by mouth daily as needed for mild constipation.  [provider] Taking Active Self  celecoxib (CELEBREX) 200 MG capsule [id number] Yes TAKE 1 CAPSULE(200 MG) BY MOUTH TWICE DAILY Corwin Levins, MD Taking Active   diphenhydrAMINE (BENADRYL) 25 mg capsule [id number] Yes Take 25 mg by mouth at bedtime as needed for allergies or sleep. [provider] Taking Active Self  EPINEPHrine (EPIPEN 2-PAK) 0.3 mg/0.3 mL IJ SOAJ injection [id number] Yes Inject 0.3 mLs (0.3 mg total) into the muscle once. Donato Schultz, DO Taking  Active   gabapentin (NEURONTIN) 100 MG capsule [id number] Yes Take 1 capsule (100 mg total) by mouth 3 (three) times daily. Corwin Levins, MD  Active   guaiFENesin (MUCINEX) 600 MG 12 hr tablet [id number] Yes Take 600 mg by mouth 2 (two) times daily as needed for cough or to loosen phlegm. [provider] Taking Active Self  LINZESS 145 MCG CAPS capsule [id number] Yes Take 145 mcg by mouth every morning. [provider] Taking Active   mupirocin ointment (BACTROBAN) 2 % [id number] Yes Apply 1 application topically every other day. Edwin Cap, DPM Taking Active   pantoprazole (PROTONIX) 40 MG tablet [id number] Yes TAKE 1 TABLET(40 MG) BY MOUTH DAILY Corwin Levins, MD Taking Active   sildenafil (REVATIO) 20 MG tablet [id number] Yes Take 1-5 tablets by mouth daily as needed. Corwin Levins, MD Taking Active   terbinafine (LAMISIL) 250 MG tablet [id number] Yes Take 1 tablet (250 mg total) by mouth daily. Lenn Sink, DPM Taking Active   terbinafine (LAMISIL) 250 MG tablet [id number] Yes Please take one a day x 7days, repeat every 4 weeks x 4 months Lenn Sink, DPM Taking Active   tiZANidine (ZANAFLEX) 4 MG tablet [id number] Yes TAKE 1 TABLET(4 MG) BY MOUTH EVERY 6 HOURS AS NEEDED FOR MUSCLE SPASMS Corwin Levins, MD Taking Active   traMADol Janean Sark) 50 MG tablet [id number]  TAKE ONE TABLET BY MOUTH EVERY 8 HOURS AS NEEDED Corwin Levins, MD  Active             Home Care and Equipment/Supplies: Were Home Health Services Ordered?: NA Any new equipment or medical supplies ordered?: NA  Functional Questionnaire: Do you need assistance  with bathing/showering or dressing?: No Do you need assistance with meal preparation?: No Do you need assistance with eating?: No Do you have difficulty maintaining continence: No Do you need assistance with getting out of bed/getting out of a chair/moving?: No Do you have difficulty managing or taking your medications?: No  Follow up  appointments reviewed: PCP Follow-up appointment confirmed?: NA Specialist Hospital Follow-up appointment confirmed?: Yes Date of Specialist follow-up appointment?:  (pt is still in rehab facility- appt not scheduled yet) Follow-Up Specialty Provider:: Dr. Raynald Kemp Do you need transportation to your follow-up appointment?: No Do you understand care options if your condition(s) worsen?: Yes-patient verbalized understanding    SIGNATURE  Agnes Lawrence, CMA (AAMA)  [facility] [phone]

## 2022-06-23 ENCOUNTER — Telehealth: Payer: Self-pay

## 2022-06-23 NOTE — Transitions of Care (Post Inpatient/ED Visit) (Signed)
06/23/2022  Name: Larry Riley MRN: [id number] DOB: 05-13-1980  Today's TOC FU Call Status: Today's TOC FU Call Status:: Successful TOC FU Call Competed TOC FU Call Complete Date: 06/23/22  Transition Care Management Follow-up Telephone Call Date of Discharge: 06/22/22 Name of Other [facility] Discharge Facility: [facility] Type of Discharge: Inpatient Admission Primary Inpatient Discharge Diagnosis:: Incontinence without sensory awareness How have you been since you were released from the hospital?: Better Any questions or concerns?: No  Items Reviewed: Did you receive and understand the discharge instructions provided?: Yes Medications obtained,verified, and reconciled?: Yes (Medications Reviewed) Any new allergies since your discharge?: No Dietary orders reviewed?: Yes Do you have support at home?: Yes  Medications Reviewed Today: Medications Reviewed Today     Reviewed by [name], LPN (Licensed Practical Nurse) on 06/23/22 at 1021  Med List Status: <None>   Medication Order Taking? Sig Documenting Provider Last Dose Status Informant  acetaminophen (TYLENOL) 325 MG tablet [id number] Yes Take 650 mg by mouth every 6 (six) hours as needed for mild pain, moderate pain, fever or headache.  [provider] Taking Active Self  bisacodyl (DULCOLAX) 5 MG EC tablet [id number] Yes Take 5 mg by mouth daily as needed for mild constipation.  [provider] Taking Active Self  celecoxib (CELEBREX) 200 MG capsule [id number] Yes TAKE 1 CAPSULE(200 MG) BY MOUTH TWICE DAILY Corwin Levins, MD Taking Active   diphenhydrAMINE (BENADRYL) 25 mg capsule [id number] Yes Take 25 mg by mouth at bedtime as needed for allergies or sleep. [provider] Taking Active Self  EPINEPHrine (EPIPEN 2-PAK) 0.3 mg/0.3 mL IJ SOAJ injection [id number] Yes Inject 0.3 mLs (0.3 mg total) into the muscle once. Donato Schultz, DO Taking Active   gabapentin (NEURONTIN) 100 MG  capsule [id number] No Take 1 capsule (100 mg total) by mouth 3 (three) times daily.  Patient not taking: Reported on 06/23/2022   Corwin Levins, MD Not Taking Active   guaiFENesin (MUCINEX) 600 MG 12 hr tablet [id number] Yes Take 600 mg by mouth 2 (two) times daily as needed for cough or to loosen phlegm. [provider] Taking Active Self  LINZESS 145 MCG CAPS capsule [id number] Yes Take 145 mcg by mouth every morning. [provider] Taking Active   mupirocin ointment (BACTROBAN) 2 % [id number] Yes Apply 1 application topically every other day. Edwin Cap, DPM Taking Active   pantoprazole (PROTONIX) 40 MG tablet [id number] Yes TAKE 1 TABLET(40 MG) BY MOUTH DAILY Corwin Levins, MD Taking Active   sildenafil (REVATIO) 20 MG tablet [id number] No Take 1-5 tablets by mouth daily as needed.  Patient not taking: Reported on 06/23/2022   Corwin Levins, MD Not Taking Active   terbinafine (LAMISIL) 250 MG tablet [id number] No Take 1 tablet (250 mg total) by mouth daily.  Patient not taking: Reported on 06/23/2022   Lenn Sink, DPM Not Taking Active   terbinafine (LAMISIL) 250 MG tablet [id number] No Please take one a day x 7days, repeat every 4 weeks x 4 months  Patient not taking: Reported on 06/23/2022   Lenn Sink, DPM Not Taking Active   tiZANidine (ZANAFLEX) 4 MG tablet [id number] Yes TAKE 1 TABLET(4 MG) BY MOUTH EVERY 6 HOURS AS NEEDED FOR MUSCLE SPASMS Corwin Levins, MD Taking Active   traMADol (ULTRAM) 50 MG tablet [id number] No TAKE ONE TABLET BY MOUTH EVERY 8 HOURS AS NEEDED  Patient not taking: Reported on 06/23/2022   Jonny Ruiz,  Len Blalock, MD Not Taking Active             Home Care and Equipment/Supplies: Were Home Health Services Ordered?: Yes Name of Home Health Agency:: unsure of name Has Agency set up a time to come to your home?: No Any new equipment or medical supplies ordered?: Yes Name of Medical supply agency?: [facility] Were you able to get the  equipment/medical supplies?: Yes (walker/ [facility]- getting wheelchair and shower bench) Do you have any questions related to the use of the equipment/supplies?: No  Functional Questionnaire: Do you need assistance with bathing/showering or dressing?: Yes Do you need assistance with meal preparation?: Yes Do you need assistance with eating?: No Do you have difficulty maintaining continence: Yes Do you need assistance with getting out of bed/getting out of a chair/moving?: Yes Do you have difficulty managing or taking your medications?: Yes (wife assists with meds and ADL)  Follow up appointments reviewed: PCP Follow-up appointment confirmed?: Yes Date of PCP follow-up appointment?: 07/02/22 Follow-up Provider: Dr Jonny Ruiz [facility] Follow-up appointment confirmed?: Yes Date of Specialist follow-up appointment?: 07/02/22 Follow-Up Specialty Provider:: urology Do you need transportation to your follow-up appointment?: No Do you understand care options if your condition(s) worsen?: Yes-patient verbalized understanding    SIGNATURE  [name] LPN [facility] Nurse Health Advisor Direct Dial [phone]

## 2022-06-29 ENCOUNTER — Other Ambulatory Visit: Payer: Self-pay

## 2022-06-29 ENCOUNTER — Other Ambulatory Visit: Payer: Self-pay | Admitting: Internal Medicine

## 2022-06-29 ENCOUNTER — Encounter: Payer: Self-pay | Admitting: Internal Medicine

## 2022-06-29 MED ORDER — TAMSULOSIN HCL 0.4 MG PO CAPS
0.4000 mg | ORAL_CAPSULE | Freq: Every day | ORAL | 0 refills | Status: DC
  Filled 2022-06-29: qty 30, 30d supply, fill #0

## 2022-06-30 ENCOUNTER — Other Ambulatory Visit: Payer: Self-pay

## 2022-06-30 MED ORDER — TIZANIDINE HCL 4 MG PO TABS: ORAL_TABLET | ORAL | 0 refills | Status: DC

## 2022-06-30 MED ORDER — TAMSULOSIN HCL 0.4 MG PO CAPS: 0.4000 mg | ORAL_CAPSULE | Freq: Every day | ORAL | 0 refills | Status: AC

## 2022-07-02 ENCOUNTER — Encounter: Payer: Self-pay | Admitting: Podiatry

## 2022-07-02 ENCOUNTER — Encounter: Payer: Self-pay | Admitting: Internal Medicine

## 2022-07-02 ENCOUNTER — Ambulatory Visit (INDEPENDENT_AMBULATORY_CARE_PROVIDER_SITE_OTHER): Payer: Managed Care, Other (non HMO) | Admitting: Internal Medicine

## 2022-07-02 ENCOUNTER — Ambulatory Visit: Payer: Managed Care, Other (non HMO) | Admitting: Podiatry

## 2022-07-02 VITALS — BP 122/76 | HR 90 | Temp 97.7°F | Ht 68.0 in | Wt 185.0 lb

## 2022-07-02 DIAGNOSIS — B351 Tinea unguium: Secondary | ICD-10-CM

## 2022-07-02 DIAGNOSIS — R739 Hyperglycemia, unspecified: Secondary | ICD-10-CM

## 2022-07-02 DIAGNOSIS — M79674 Pain in right toe(s): Secondary | ICD-10-CM | POA: Diagnosis not present

## 2022-07-02 DIAGNOSIS — M79675 Pain in left toe(s): Secondary | ICD-10-CM

## 2022-07-02 DIAGNOSIS — R32 Unspecified urinary incontinence: Secondary | ICD-10-CM | POA: Diagnosis not present

## 2022-07-02 DIAGNOSIS — Q8501 Neurofibromatosis, type 1: Secondary | ICD-10-CM

## 2022-07-02 DIAGNOSIS — K219 Gastro-esophageal reflux disease without esophagitis: Secondary | ICD-10-CM

## 2022-07-02 NOTE — Patient Instructions (Signed)
Ok to continue the tramadol for pain with refills as needed  Please continue all other medications as before, and refills have been done if requested.  Please have the pharmacy call with any other refills you may need.  Please continue your efforts at being more active, low cholesterol diet, and weight control  Please keep your appointments with your specialists as you may have planned  No further lab work needed today  Please make an Appointment to return in 6 months, or sooner if needed

## 2022-07-02 NOTE — Progress Notes (Signed)
Patient ID: Panayiotis Rainville, male   DOB: 02/11/1980, 42 y.o.   MRN: [id number]        Chief Complaint: follow up post hospn 4/11 4/12 NS procedure x 2       HPI:  Custer Pimenta is a 42 y.o. male here with c/o above, with post op leg weakness, decreased sensation to legs, bowel and bladder incontinence, then 2 mo rehab at [facility] then home June 10.  Wife present, overall doing ok.  Has NS appt fu in sept 2024, and urology soon with bladder scan tomorrow.  Pt denies chest pain, increased sob or doe, wheezing, orthopnea, PND, increased LE swelling, palpitations, dizziness or syncope.   Pt denies polydipsia, polyuria, or new focal neuro s/s.   Denies worsening reflux, abd pain, dysphagia, n/v, bowel change or blood.       Wt Readings from Last 3 Encounters:  07/02/22 185 lb (83.9 kg)  02/04/22 200 lb 2 oz (90.8 kg)  08/04/21 190 lb (86.2 kg)   BP Readings from Last 3 Encounters:  07/02/22 122/76  02/04/22 108/62  08/04/21 110/60         Past Medical History:  Diagnosis Date   GERD (gastroesophageal reflux disease) 01/21/2017   MRSA (methicillin resistant Staphylococcus aureus) 2005   leg   Neurofibromatosis    tumor down spine and brain   Past Surgical History:  Procedure Laterality Date   hydrocephalus     [place]unt 1999[place]   NECK SURGERY      tumor removal   SPINE SURGERY  10/2013   c2-t2 fusion  , laminectomy c1-6-- Dr Raynald Kemp-- [facility]   VASECTOMY  02/2013    reports that he has never smoked. He has never used smokeless tobacco. He reports current alcohol use of about 3.0 standard drinks of alcohol per week. He reports that he does not use drugs. family history includes Arthritis in his father, paternal grandfather, and paternal grandmother; Diabetes in his mother and paternal grandfather; Healthy in his maternal grandfather and maternal grandmother; Hyperlipidemia in his paternal grandfather; Hypertension in his father and paternal grandfather; Prostate cancer in his father and another  family member. No Known Allergies Current Outpatient Medications on File Prior to Visit  Medication Sig Dispense Refill   acetaminophen (TYLENOL) 325 MG tablet Take 650 mg by mouth every 6 (six) hours as needed for mild pain, moderate pain, fever or headache.      bisacodyl (DULCOLAX) 5 MG EC tablet Take 5 mg by mouth daily as needed for mild constipation.      celecoxib (CELEBREX) 200 MG capsule TAKE 1 CAPSULE(200 MG) BY MOUTH TWICE DAILY 180 capsule 2   diphenhydrAMINE (BENADRYL) 25 mg capsule Take 25 mg by mouth at bedtime as needed for allergies or sleep.     EPINEPHrine (EPIPEN 2-PAK) 0.3 mg/0.3 mL IJ SOAJ injection Inject 0.3 mLs (0.3 mg total) into the muscle once. 1 Device 1   gabapentin (NEURONTIN) 100 MG capsule Take 1 capsule (100 mg total) by mouth 3 (three) times daily. 90 capsule 5   guaiFENesin (MUCINEX) 600 MG 12 hr tablet Take 600 mg by mouth 2 (two) times daily as needed for cough or to loosen phlegm.     HYDROcodone-acetaminophen (NORCO/VICODIN) 5-325 MG tablet Take 1 tablet by mouth every 6 (six) hours as needed for moderate pain.     LINZESS 145 MCG CAPS capsule Take 145 mcg by mouth every morning.     mupirocin ointment (BACTROBAN) 2 % Apply 1 application topically  every other day. 15 g 0   pantoprazole (PROTONIX) 40 MG tablet TAKE 1 TABLET(40 MG) BY MOUTH DAILY 30 tablet 0   sildenafil (REVATIO) 20 MG tablet Take 1-5 tablets by mouth daily as needed. 50 tablet 5   tamsulosin (FLOMAX) 0.4 MG CAPS capsule Take 1 capsule (0.4 mg total) by mouth daily after supper. Annual appt due in July must see provider for future refills 30 capsule 0   terbinafine (LAMISIL) 250 MG tablet Take 1 tablet (250 mg total) by mouth daily. 90 tablet 0   terbinafine (LAMISIL) 250 MG tablet Please take one a day x 7days, repeat every 4 weeks x 4 months 28 tablet 0   tiZANidine (ZANAFLEX) 4 MG tablet Take 1 tablet by mouth every 6 hours as need for muscle spasm. Annual appt is due pt must see provider  for future refills 120 tablet 0   traMADol (ULTRAM) 50 MG tablet TAKE ONE TABLET BY MOUTH EVERY 8 HOURS AS NEEDED 90 tablet 2   No current facility-administered medications on file prior to visit.        ROS:  All others reviewed and negative.  Objective        PE:  BP 122/76 (BP Location: Right Arm, Patient Position: Sitting, Cuff Size: Normal)   Pulse 90   Temp 97.7 F (36.5 C) (Oral)   Ht 5\' 8"  (1.727 m)   Wt 185 lb (83.9 kg)   SpO2 98%   BMI 28.13 kg/m                 Constitutional: Pt appears in NAD               HENT: Head: NCAT.                Right Ear: External ear normal.                 Left Ear: External ear normal.                Eyes: . Pupils are equal, round, and reactive to light. Conjunctivae and EOM are normal               Nose: without d/c or deformity               Neck: Neck supple. Gross normal ROM               Cardiovascular: Normal rate and regular rhythm.                 Pulmonary/Chest: Effort normal and breath sounds without rales or wheezing.                Abd:  Soft, NT, ND, + BS, no organomegaly               Neurological: Pt is alert. At baseline orientation               Skin: Skin is warm. No rashes, no other new lesions, LE edema - none              Psychiatric: Pt behavior is normal without agitation   Micro: none  Cardiac tracings I have personally interpreted today:  none  Pertinent Radiological findings (summarize): none   Lab Results  Component Value Date   WBC 7.4 08/19/2021   HGB 14.0 08/19/2021   HCT 41.6 08/19/2021   PLT 407 08/19/2021   GLUCOSE 88 08/19/2021   CHOL 152 08/19/2021  TRIG 202 (H) 08/19/2021   HDL 49 08/19/2021   LDLCALC 70 08/19/2021   ALT 36 08/19/2021   AST 28 08/19/2021   NA 139 08/19/2021   K 4.5 08/19/2021   CL 106 08/19/2021   CREATININE 0.81 08/19/2021   BUN 23 08/19/2021   CO2 21 08/19/2021   TSH 1.440 08/19/2021   HGBA1C 5.5 08/19/2021   Assessment/Plan:  Fortino Haag is a 42 y.o.  White or Caucasian [1] male with  has a past medical history of GERD (gastroesophageal reflux disease) (01/21/2017), MRSA (methicillin resistant Staphylococcus aureus) (2005), and Neurofibromatosis.  Neurofibromatosis, type 1 (HCC) Now post op stable, for f/u NS as planned, ok for tramadol restart prn pain  Hyperglycemia Lab Results  Component Value Date   HGBA1C 5.5 08/19/2021   Stable, pt to continue current medical treatment  - diet, wt control   Urinary incontinence For f/u urology tomorrow  Gastroesophageal reflux disease Stable overall, cont protoninx 40 qd  Followup: Return in about 6 months (around 01/01/2023).  Oliver Barre, MD 07/04/2022 8:34 PM [facility]

## 2022-07-04 ENCOUNTER — Encounter: Payer: Self-pay | Admitting: Internal Medicine

## 2022-07-04 DIAGNOSIS — R32 Unspecified urinary incontinence: Secondary | ICD-10-CM | POA: Insufficient documentation

## 2022-07-04 NOTE — Assessment & Plan Note (Signed)
Lab Results  Component Value Date   HGBA1C 5.5 08/19/2021   Stable, pt to continue current medical treatment  - diet, wt control  

## 2022-07-04 NOTE — Assessment & Plan Note (Signed)
Stable overall, cont protoninx 40 qd

## 2022-07-04 NOTE — Progress Notes (Signed)
Subjective:   Patient ID: Larry Riley, male   DOB: 42 y.o.   MRN: [id number]   HPI Patient presents with thickened nailbeds of both feet that are dystrophic and can be painful at times.  Patient had this treated previously but it always reoccurs and are very difficult for him to cut   ROS      Objective:  Physical Exam  Neurovascular status intact with mycotic nail infection 1-5 both feet painful when pressed     Assessment:  Chronic mycotic nail infection 1-5 both feet with pain     Plan:  Debrided nailbeds 1-5 both feet no angiogenic bleeding reappoint routine care

## 2022-07-04 NOTE — Assessment & Plan Note (Addendum)
Now post op stable, for f/u NS as planned, ok for tramadol restart prn pain

## 2022-07-04 NOTE — Assessment & Plan Note (Signed)
For f/u urology tomorrow

## 2022-07-09 ENCOUNTER — Ambulatory Visit (INDEPENDENT_AMBULATORY_CARE_PROVIDER_SITE_OTHER): Payer: 59 | Admitting: Behavioral Health

## 2022-07-09 ENCOUNTER — Encounter: Payer: Self-pay | Admitting: Behavioral Health

## 2022-07-09 DIAGNOSIS — F411 Generalized anxiety disorder: Secondary | ICD-10-CM

## 2022-07-09 NOTE — Progress Notes (Signed)
                Janah Mcculloh M Sehaj Kolden, LCMHC 

## 2022-07-09 NOTE — Progress Notes (Addendum)
[facility] Counselor Initial Adult Exam  Name: Larry Riley Date: 07/09/2022 MRN: [id number] DOB: [birth year] PCP: Corwin Levins, MD  Time spent: 60 minutes, 10 AM until 11 AM.This session was held via video teletherapy. The patient consented to the video teletherapy and was located in his home during this session. He is aware it is the responsibility of the patient to secure confidentiality on his end of the session. The provider was in a private home office for the duration of this session.      Guardian/Payee: Self  Paperwork requested: No   Reason for Visit /Presenting Problem: Anxiety, depression, medical issues  Larry Riley is a 42 year old married male who presents with symptoms of anxiety and depression primarily related to health issues and medical treatment including significant recovery from the most recent surgery.  He currently lives with his wife whom he has been married to since 08/02/13.  He and his wife met through mutual friends.  They do not have any children but do have 2 dogs, 1 a carin terrier and the other a carinterrier mix.  He works for [employer] although he is on leave until July 12 which can be extended if needed.  His wife is not currently working.  The patient grew up with his parents in [place].  When they were around 12 his parents separated and divorced.  His mother remarried in about a year and his father did not remarry.  The first couple of years with his stepfather were somewhat rocky but that got better and he had a fair relationship with his stepfather until his stepfather died in 2017/08/02.  His mother is still alive and currently lives with his biological brother in [place].  His father still lives in [place] but has multiple health issues.  He has never had a great relationship with his biological parents.  He also has a biological sister.  He reports a fair relationship with her but not much of a relationship with his biological brother.  He also has 3  step brothers whom he does not have a significant relationship with either.  The patient moved to the area with 2 of his friends who attended the [place] [employer].  They encouraged him to move to the area as he was looking for work.  He initially worked in [employer] area around 2009 2010 was recruited by [employer] where he has been since 08-03-2018.  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  Very young he was diagnosed with neurofibromatosis type I.  Type I means that there is no family history of.  It is characterized by tumors and nerve endings in his car especially difficult up and down his spine.  He was diagnosed in August 02, 1993 in eighth grade and has had multiple surgeries including in [id number]-Jul-2018Jul 22, 2022 and then 2 back to back surgeries on his back in April of this year.  The first 1 okay but the second came with significant complications.  He was already having some issues saying he had foot drag in 08/02/2016 and has worn a foot or a brace fairly consistently since then.  With this most recent back issue he lost control of his bladder and bowels and has difficulty standing and walking.  He was in a rehabilitation facility for 2 months coming home just a few weeks ago.  He is gaining some strength back and his legs but has to use a walker and  is beginning to have some more strength to be able to stand up on his own while relying on a walker.  He is starting to at least have a little more bladder control.  He also reports intimacy issues which is difficult for him but doctors do feel most of this is related to nerve issues connected to the surgery.  He reports that the time in the rehabilitation facility was very difficult for him emotionally.  He could see his wife daily but said that the question what he would be able to do moving forward since he was having difficulty walking and with bowel and bladder function.  He says that he has always lived with the pain  level of about a 4 or 5 since high school but has always been very careful about taking pain medication for fear of addiction.  Years ago he was on hydrocodone but wanted to come off of that and now primarily takes tramadol and gabapentin.  He says he allows his wife to manage his medication.  In the span of just a few months around 2018 he had health issues.  His stepfather died.  His wife's father died.  That has been very difficult for her emotionally to deal with.  Her mother lives in the area and is a support as her church members and faith is a big part of the patient and his wife's lives.  The patient reports a history of anxiety but what he describes as self frustration especially in related to medical issues.  He reports some minimal depression primarily related to his time in a rehabilitation but feels that is manageable now.  Coping skills include time with his wife, playing with his dogs, listening to music, breathing.  He always feels that he is done a pretty good job of challenging negative thoughts and using positive self-talk.  When he was younger he had to push himself a lot physically with all of the surgeries and difficulty this had and he is learning to push himself mentally and emotionally and that also is a goal for him for therapy.  He does contract for safety having no thoughts of hurting himself or anyone else.  Mental Status Exam: Appearance:   Well Groomed     Behavior:  Appropriate  Motor:  Normal  Speech/Language:   Normal Rate  Affect:  Appropriate  Mood:  normal  Thought process:  normal  Thought content:    WNL  Sensory/Perceptual disturbances:    WNL  Orientation:  oriented to person, place, time/date, situation, day of week, month of year, and year  Attention:  Good  Concentration:  Good  Memory:  WNL  Fund of knowledge:   Good  Insight:    Good  Judgment:   Good  Impulse Control:  Good    Reported Symptoms: Anxiety and depression  Risk  Assessment: Danger to Self:  No Self-injurious Behavior: No Danger to Others: No Duty to Warn:no Physical Aggression / Violence:No  Access to Firearms a concern: No  Gang Involvement:No  Patient / guardian was educated about steps to take if suicide or homicide risk level increases between visits: n/a While future psychiatric events cannot be accurately predicted, the patient does not currently require acute inpatient psychiatric care and does not currently meet [facility] involuntary commitment criteria.  Substance Abuse History: Current substance abuse: No     Past Psychiatric History:   Previous psychological history is significant for anxiety and depression Outpatient Providers: Primary care  physician, various specialist History of Psych Hospitalization:  None reported Psychological Testing:  n/a    Abuse History:  Victim of: No.,  None reported    Report needed: No. Victim of Neglect:No. Perpetrator of none reported  Witness / Exposure to Domestic Violence: None reported  Protective Services Involvement: No  Witness to [employer] Violence:   None reported  Family History:  Family History  Problem Relation Age of Onset   Diabetes Mother    Arthritis Father    Hypertension Father    Prostate cancer Father    Arthritis Paternal Grandmother    Arthritis Paternal Grandfather    Diabetes Paternal Grandfather    Hyperlipidemia Paternal Grandfather    Hypertension Paternal Grandfather    Healthy Maternal Grandmother    Healthy Maternal Grandfather    Prostate cancer Other     Living situation: the patient lives with their spouse  Sexual Orientation: Straight  Relationship Status: married  Name of spouse / other: If a parent, number of children / ages: Patient has no children  Support Systems: spouse Discharge family, coworkers  Surveyor, quantity Stress:   None reported  Income/Employment/Disability: Employment  Financial planner: No   Educational  History: Education: Risk manager: Protestant  Any cultural differences that may affect / interfere with treatment:  not applicable   Recreation/Hobbies: The patient reports that he has always been active and enjoyed outdoors but medical issues have limited what he can do.  He enjoys time with his wife.  He enjoys playing with his dog  Stressors: Health problems    Strengths: Supportive Relationships, Family, Church, Spirituality, Hopefulness, Journalist, newspaper, and Able to Communicate Effectively  Barriers:     Legal History: Pending legal issue / charges:  None reported. History of legal issue / charges:  None reported  Medical History/Surgical History: reviewed Past Medical History:  Diagnosis Date   GERD (gastroesophageal reflux disease) 01/21/2017   MRSA (methicillin resistant Staphylococcus aureus) 2005   leg   Neurofibromatosis    tumor down spine and brain    Past Surgical History:  Procedure Laterality Date   hydrocephalus     [place]unt 1999[place]   NECK SURGERY      tumor removal   SPINE SURGERY  10/2013   c2-t2 fusion  , laminectomy c1-6-- Dr Raynald Kemp-- [facility]   VASECTOMY  02/2013    Medications: Current Outpatient Medications  Medication Sig Dispense Refill   acetaminophen (TYLENOL) 325 MG tablet Take 650 mg by mouth every 6 (six) hours as needed for mild pain, moderate pain, fever or headache.      bisacodyl (DULCOLAX) 5 MG EC tablet Take 5 mg by mouth daily as needed for mild constipation.      celecoxib (CELEBREX) 200 MG capsule TAKE 1 CAPSULE(200 MG) BY MOUTH TWICE DAILY 180 capsule 2   diphenhydrAMINE (BENADRYL) 25 mg capsule Take 25 mg by mouth at bedtime as needed for allergies or sleep.     EPINEPHrine (EPIPEN 2-PAK) 0.3 mg/0.3 mL IJ SOAJ injection Inject 0.3 mLs (0.3 mg total) into the muscle once. 1 Device 1   gabapentin (NEURONTIN) 100 MG capsule Take 1 capsule (100 mg total) by mouth 3 (three) times daily. 90 capsule 5    guaiFENesin (MUCINEX) 600 MG 12 hr tablet Take 600 mg by mouth 2 (two) times daily as needed for cough or to loosen phlegm.     HYDROcodone-acetaminophen (NORCO/VICODIN) 5-325 MG tablet Take 1 tablet by mouth every 6 (six) hours as needed for  moderate pain.     LINZESS 145 MCG CAPS capsule Take 145 mcg by mouth every morning.     mupirocin ointment (BACTROBAN) 2 % Apply 1 application topically every other day. 15 g 0   pantoprazole (PROTONIX) 40 MG tablet TAKE 1 TABLET(40 MG) BY MOUTH DAILY 30 tablet 0   sildenafil (REVATIO) 20 MG tablet Take 1-5 tablets by mouth daily as needed. 50 tablet 5   tamsulosin (FLOMAX) 0.4 MG CAPS capsule Take 1 capsule (0.4 mg total) by mouth daily after supper. Annual appt due in July must see provider for future refills 30 capsule 0   terbinafine (LAMISIL) 250 MG tablet Take 1 tablet (250 mg total) by mouth daily. 90 tablet 0   terbinafine (LAMISIL) 250 MG tablet Please take one a day x 7days, repeat every 4 weeks x 4 months 28 tablet 0   tiZANidine (ZANAFLEX) 4 MG tablet Take 1 tablet by mouth every 6 hours as need for muscle spasm. Annual appt is due pt must see provider for future refills 120 tablet 0   traMADol (ULTRAM) 50 MG tablet TAKE ONE TABLET BY MOUTH EVERY 8 HOURS AS NEEDED 90 tablet 2   No current facility-administered medications for this visit.    No Known Allergies  Diagnoses:  Generalized anxiety disorder related primarily to medical condition  Plan of Care: I will meet with the patient every 2 weeks via video session.  Goals are to help the patient navigate the stress and anxiety that comes along with a history of health issues and multiple surgeries.   French Ana, [facility]

## 2022-07-20 ENCOUNTER — Encounter: Payer: Self-pay | Admitting: Internal Medicine

## 2022-07-20 MED ORDER — GABAPENTIN 600 MG PO TABS
600.0000 mg | ORAL_TABLET | Freq: Three times a day (TID) | ORAL | 1 refills | Status: DC
Start: 2022-07-20 — End: 2023-01-08

## 2022-08-07 ENCOUNTER — Ambulatory Visit: Payer: 59 | Admitting: Behavioral Health

## 2022-08-07 ENCOUNTER — Encounter: Payer: Self-pay | Admitting: Behavioral Health

## 2022-08-07 DIAGNOSIS — F411 Generalized anxiety disorder: Secondary | ICD-10-CM | POA: Diagnosis not present

## 2022-08-07 NOTE — Progress Notes (Signed)
                Craig M Peters, LCMHC 

## 2022-08-07 NOTE — Progress Notes (Signed)
[facility] Counselor/Therapist Progress Note  Patient ID: Killion Homrich, MRN: [id number],    Date: 08/07/2022  Time Spent: 60 minutes from 11 AM until 12 PM.This session was held via video teletherapy. The patient consented to the video teletherapy and was located in his home office during this session.  He is aware it is the responsibility of the patient to secure confidentiality on his end of the session. The provider was in a private home office for the duration of this session.      Treatment Type: Individual Therapy  Reported Symptoms: Anxiety/stress  Mental Status Exam: Appearance:  Casual     Behavior: Appropriate  Motor: Normal  Speech/Language:  Normal Rate  Affect: Appropriate  Mood: normal  Thought process: normal  Thought content:   WNL  Sensory/Perceptual disturbances:   WNL  Orientation: oriented to person, place, time/date, situation, day of week, and month of year  Attention: Good  Concentration: Good  Memory: WNL  Fund of knowledge:  Good  Insight:   Good  Judgment:  Good  Impulse Control: Good   Risk Assessment: Danger to Self:  No Self-injurious Behavior: No Danger to Others: No Duty to Warn:no Physical Aggression / Violence:No  Access to Firearms a concern: No  Gang Involvement:No   Subjective: Reviewed the treatment plan goals with the patient he is in agreement.  A formal treatment plan is as listed below.  The patient is now in his second full back at work after his surgery earlier this year.  He acknowledges that the first couple days were difficult in terms of stamina because he had not worked in a while and is in the midst of recovery as well as physical therapy.  He still is tired but is making it through that it may fairly well.  Physical therapy is coming to his home to 3 times per week based on his unavailability and the last couple of times he has walked with a 4-prong that came up and down the hall for about 10 minutes which  is substantial improvement from being on a walker.  He has tried to go out 1 time to get some shoes which support him more.  He said stamina wise he did okay but he also has to be very aware of gastrointestinal as well as bladder issues based on the surgeries she went through.  He is also seeing steady improvement with that.  He understands and has spoken with his doctors about the gradual regeneration of nerves after the surgery and knows that it could be something that he has to be very patient with.  He feels that he is being patient with that.  There is some anxiety in the recovery process he feels that he is managing that fairly well.  We also talked about his relationship with his father and how he has had to set boundaries.  His father lives in [place] and has limited here.  He is in a apartment where he has a caregiver as well as a Child psychotherapist but the patient wonders if he might have to go to assisted living sometime in the near future.  He knows it is but he cannot go on a mission to care for his father that his father had a stroke at times does not always remember the boundaries that the patient is setting with him.  He is setting very clear boundaries and that he cannot speak to  Worked a and that he is back to work  and father does not call him in 6 PM he works until about 630 because his father limiting his phone calls to 30 minutes.  His mother and sister live in the same state but are interactive with his father at all.  Talked about what healthy boundaries with his father and family members look like.  He feels some responsibility for not adequately explaining to his wife before they got married what his family dynamics are like especially with his parents and at times his wife just has to step away from interaction with his parents.  The patient feels some guilt with that so we will talk about that when the next session.  He says that makes him feel that he does not stand up for his wife  especially in terms of family so we can work on some assertiveness training in that area in future sessions.  Interventions: Cognitive Behavioral Therapy  Diagnosis: Generalized anxiety disorder  Plan: I will meet with the patient every 2 to 3 weeks via care agility.  Treatment plan:  French Ana, [facility]

## 2022-08-14 ENCOUNTER — Encounter: Payer: Self-pay | Admitting: Internal Medicine

## 2022-08-17 ENCOUNTER — Encounter: Payer: Self-pay | Admitting: Internal Medicine

## 2022-08-17 ENCOUNTER — Ambulatory Visit (INDEPENDENT_AMBULATORY_CARE_PROVIDER_SITE_OTHER): Payer: Managed Care, Other (non HMO) | Admitting: Internal Medicine

## 2022-08-17 VITALS — HR 97 | Temp 97.8°F | Ht 68.0 in

## 2022-08-17 DIAGNOSIS — K219 Gastro-esophageal reflux disease without esophagitis: Secondary | ICD-10-CM

## 2022-08-17 DIAGNOSIS — I959 Hypotension, unspecified: Secondary | ICD-10-CM | POA: Insufficient documentation

## 2022-08-17 DIAGNOSIS — R600 Localized edema: Secondary | ICD-10-CM

## 2022-08-17 DIAGNOSIS — R739 Hyperglycemia, unspecified: Secondary | ICD-10-CM | POA: Diagnosis not present

## 2022-08-17 NOTE — Assessment & Plan Note (Signed)
Lab Results  Component Value Date   HGBA1C 5.5 08/19/2021   Stable, pt to continue current medical treatment  - diet, wt control

## 2022-08-17 NOTE — Assessment & Plan Note (Signed)
Stable,continue current med tx plan

## 2022-08-17 NOTE — Assessment & Plan Note (Addendum)
Recent onset, ? Nutrition related with wt loss and malnourished at rehab - for labs tomorrow including LFTs with total protein, albumin; also for compression stockings to leg, avoid diuretic for now due to lower bp, for leg elevation as much as practical

## 2022-08-17 NOTE — Patient Instructions (Addendum)
Dr Jonny Ruiz NPI number [id number],  fax [phone]  With your BP on the lower side, please drink more fluids  Please go to ED for any worsening dizziness, fatigue, or falls  Please keep the legs elevated as much as practical, and try compression stockings to the knees during the daytime only  Please continue all other medications as before, and refills have been done if requested.  Please have the pharmacy call with any other refills you may need.  Please continue your efforts at being more active, low cholesterol diet, and weight control.  You are otherwise up to date with prevention measures today.  Please keep your appointments with your specialists as you may have planned  You are given the order for labs including for physical and cortisol level (adrenal gland) today to have done at [facility]  Please make an Appointment to return in 6 months, or sooner if needed

## 2022-08-17 NOTE — Assessment & Plan Note (Addendum)
An infrequent recurring issue per pt, etiology unclear, continue to increase po fluids for now and f/up BP at home; may also need added salt to diet despite edema, also consider midodrine if persists and/or neurology referral (has not been seen for years); no evidence today for infection sepsis, overt bleeding, and doubt chf, cirrhosis or renal failure - but will need lab tomorrow as planned; consdier further such as echo, does also have MRI abd incidentally pending for left renal lesion eval

## 2022-08-17 NOTE — Progress Notes (Signed)
Patient ID: Larry Riley, male   DOB: [birth year], 42 y.o.   MRN: [id number]        Chief Complaint: follow up bilateral leg swelling, low BP, hx neurofibromatosis, s/p recent spine surgury       HPI:  Larry Riley is a 42 y.o. male here after back to work recently s/p recent spine surgury and prolonged rehab total 2 months, mostly lying flat with leg elevated, now back at work with sitting up in the chair at the home computer desk with legs dependent most of the day now with worsening swelling right > left;  Pt denies chest pain, increased sob or doe, wheezing, orthopnea, PND,  palpitations, or syncope, though incidentally dizzy today with lower BP which pt states happens infrequently with exertion such as coming here out of the home to doctor visits.  BP has been 70's - 80's SBP today, pt has been dizzy, feels some improved with drinking fluids while waiting here.  Declines to go to ED.   Last episode syncope 2018  Pt denies polydipsia, polyuria, or new focal neuro s/s.    Pt denies fever,, night sweats, loss of appetite, or other constitutional symptoms, though appears to have lost wt overall from 200 to 185 recently.  Denies worsening depressive symptoms, suicidal ideation, or panic  Works for [employer]  - incidiently has yearly labs scheduled to be done tomorrow.  Did have abnormal UA June 4 with [facility] c/w likely chronic uti.  Also incidentally has spot to the left kidney, for fup MRI abd soon.   Denies urinary symptoms such as dysuria, frequency, urgency, flank pain, hematuria or n/v, fever, chills.  No cough  Denies worsening reflux, abd pain, dysphagia, n/v, bowel change or blood.       Wt Readings from Last 3 Encounters:  07/02/22 185 lb (83.9 kg)  02/04/22 200 lb 2 oz (90.8 kg)  08/04/21 190 lb (86.2 kg)   BP Readings from Last 3 Encounters:  07/02/22 122/76  02/04/22 108/62  08/04/21 110/60         Past Medical History:  Diagnosis Date   Anxiety    Depression    GERD  (gastroesophageal reflux disease) 01/21/2017   MRSA (methicillin resistant Staphylococcus aureus) 2005   leg   Neurofibromatosis    tumor down spine and brain   Past Surgical History:  Procedure Laterality Date   hydrocephalus     shunt 1999[place]   NECK SURGERY      tumor removal   SPINE SURGERY  10/2013   c2-t2 fusion  , laminectomy c1-6-- Dr Raynald Kemp-- [facility]   VASECTOMY  02/2013    reports that he has never smoked. He has never used smokeless tobacco. He reports current alcohol use of about 3.0 standard drinks of alcohol per week. He reports that he does not use drugs. family history includes Arthritis in his father, paternal grandfather, and paternal grandmother; Diabetes in his mother and paternal grandfather; Healthy in his maternal grandfather and maternal grandmother; Hyperlipidemia in his paternal grandfather; Hypertension in his father and paternal grandfather; Prostate cancer in his father and another family member. No Known Allergies Current Outpatient Medications on File Prior to Visit  Medication Sig Dispense Refill   acetaminophen (TYLENOL) 325 MG tablet Take 650 mg by mouth every 6 (six) hours as needed for mild pain, moderate pain, fever or headache.      bisacodyl (DULCOLAX) 5 MG EC tablet Take 5 mg by mouth daily as needed for mild  constipation.      celecoxib (CELEBREX) 200 MG capsule TAKE 1 CAPSULE(200 MG) BY MOUTH TWICE DAILY 180 capsule 2   diphenhydrAMINE (BENADRYL) 25 mg capsule Take 25 mg by mouth at bedtime as needed for allergies or sleep.     gabapentin (NEURONTIN) 600 MG tablet Take 1 tablet (600 mg total) by mouth 3 (three) times daily. 270 tablet 1   guaiFENesin (MUCINEX) 600 MG 12 hr tablet Take 600 mg by mouth 2 (two) times daily as needed for cough or to loosen phlegm.     LINZESS 145 MCG CAPS capsule Take 145 mcg by mouth every morning.     pantoprazole (PROTONIX) 40 MG tablet TAKE 1 TABLET(40 MG) BY MOUTH DAILY 30 tablet 0   sildenafil (REVATIO) 20 MG  tablet Take 1-5 tablets by mouth daily as needed. 50 tablet 5   tamsulosin (FLOMAX) 0.4 MG CAPS capsule Take 1 capsule (0.4 mg total) by mouth daily after supper. Annual appt due in July must see provider for future refills 30 capsule 0   tiZANidine (ZANAFLEX) 4 MG tablet Take 1 tablet by mouth every 6 hours as need for muscle spasm. Annual appt is due pt must see provider for future refills 120 tablet 0   traMADol (ULTRAM) 50 MG tablet TAKE ONE TABLET BY MOUTH EVERY 8 HOURS AS NEEDED 90 tablet 2   EPINEPHrine (EPIPEN 2-PAK) 0.3 mg/0.3 mL IJ SOAJ injection Inject 0.3 mLs (0.3 mg total) into the muscle once. (Patient not taking: Reported on 08/17/2022) 1 Device 1   HYDROcodone-acetaminophen (NORCO/VICODIN) 5-325 MG tablet Take 1 tablet by mouth every 6 (six) hours as needed for moderate pain. (Patient not taking: Reported on 08/17/2022)     mupirocin ointment (BACTROBAN) 2 % Apply 1 application topically every other day. (Patient not taking: Reported on 08/17/2022) 15 g 0   terbinafine (LAMISIL) 250 MG tablet Take 1 tablet (250 mg total) by mouth daily. (Patient not taking: Reported on 08/17/2022) 90 tablet 0   terbinafine (LAMISIL) 250 MG tablet Please take one a day x 7days, repeat every 4 weeks x 4 months (Patient not taking: Reported on 08/17/2022) 28 tablet 0   No current facility-administered medications on file prior to visit.        ROS:  All others reviewed and negative.  Objective        PE:  Pulse 97   Temp 97.8 F (36.6 C) (Oral)   Ht 5\' 8"  (1.727 m)   SpO2 98%   BMI 28.13 kg/m                 Constitutional: Pt appears in NAD, fatigued, non toxic but dizzy and bows head occasinoally               HENT: Head: NCAT.                Right Ear: External ear normal.                 Left Ear: External ear normal.                Eyes: . Pupils are equal, round, and reactive to light. Conjunctivae and EOM are normal               Nose: without d/c or deformity               Neck: Neck supple.  Gross normal ROM  Cardiovascular: Normal rate and regular rhythm.                 Pulmonary/Chest: Effort normal and breath sounds without rales or wheezing.                Abd:  Soft, NT, ND, + BS, no organomegaly               Neurological: Pt is alert. At baseline orientation, motor grossly intact               Skin: Skin is warm. No rashes, no other new lesions, LE edema - trace to 1+ bilateral right > left               Psychiatric: Pt behavior is normal without agitation   Micro: none  Cardiac tracings I have personally interpreted today:  none  Pertinent Radiological findings (summarize): none   Lab Results  Component Value Date   WBC 7.4 08/19/2021   HGB 14.0 08/19/2021   HCT 41.6 08/19/2021   PLT 407 08/19/2021   GLUCOSE 88 08/19/2021   CHOL 152 08/19/2021   TRIG 202 (H) 08/19/2021   HDL 49 08/19/2021   LDLCALC 70 08/19/2021   ALT 36 08/19/2021   AST 28 08/19/2021   NA 139 08/19/2021   K 4.5 08/19/2021   CL 106 08/19/2021   CREATININE 0.81 08/19/2021   BUN 23 08/19/2021   CO2 21 08/19/2021   TSH 1.440 08/19/2021   HGBA1C 5.5 08/19/2021   Assessment/Plan:  Airic Salmela is a 42 y.o. White or Caucasian [1] male with  has a past medical history of Anxiety, Depression, GERD (gastroesophageal reflux disease) (01/21/2017), MRSA (methicillin resistant Staphylococcus aureus) (2005), and Neurofibromatosis.  Peripheral edema Recent onset, ? Nutrition related with wt loss and malnourished at rehab - for labs tomorrow including LFTs with total protein, albumin; also for compression stockings to leg, avoid diuretic for now due to lower bp, for leg elevation as much as practical  Hypotension An infrequent recurring issue per pt, etiology unclear, continue to increase po fluids for now and f/up BP at home; may also need added salt to diet despite edema, also consider midodrine if persists and/or neurology referral (has not been seen for years); no evidence today for  infection sepsis, overt bleeding, and doubt chf, cirrhosis or renal failure - but will need lab tomorrow as planned; consdier further such as echo, does also have MRI abd incidentally pending for left renal lesion eval  Hyperglycemia Lab Results  Component Value Date   HGBA1C 5.5 08/19/2021   Stable, pt to continue current medical treatment  - diet, wt control   Gastroesophageal reflux disease Stable,continue current med tx plan  Followup: No follow-ups on file.  Oliver Barre, MD 08/17/2022 8:28 PM [facility]

## 2022-08-19 ENCOUNTER — Ambulatory Visit (INDEPENDENT_AMBULATORY_CARE_PROVIDER_SITE_OTHER): Payer: 59 | Admitting: Behavioral Health

## 2022-08-19 ENCOUNTER — Encounter: Payer: Self-pay | Admitting: Behavioral Health

## 2022-08-19 ENCOUNTER — Encounter: Payer: Self-pay | Admitting: Internal Medicine

## 2022-08-19 DIAGNOSIS — F411 Generalized anxiety disorder: Secondary | ICD-10-CM

## 2022-08-19 DIAGNOSIS — F331 Major depressive disorder, recurrent, moderate: Secondary | ICD-10-CM

## 2022-08-19 NOTE — Progress Notes (Signed)
[facility] Counselor/Therapist Progress Note  Patient ID: Larry Riley, MRN: [id number],    Date: 08/19/2022  Time Spent: 56 minutes from 10 AM until 10:56 AM This session was held via video teletherapy. The patient consented to the video teletherapy and was located in his home office during this session.  He is aware it is the responsibility of the patient to secure confidentiality on his end of the session. The provider was in a private home office for the duration of this session.      Treatment Type: Individual Therapy  Reported Symptoms: Anxiety/stress  Mental Status Exam: Appearance:  Casual     Behavior: Appropriate  Motor: Normal  Speech/Language:  Normal Rate  Affect: Appropriate  Mood: normal  Thought process: normal  Thought content:   WNL  Sensory/Perceptual disturbances:   WNL  Orientation: oriented to person, place, time/date, situation, day of week, and month of year  Attention: Good  Concentration: Good  Memory: WNL  Fund of knowledge:  Good  Insight:   Good  Judgment:  Good  Impulse Control: Good   Risk Assessment: Danger to Self:  No Self-injurious Behavior: No Danger to Others: No Duty to Warn:no Physical Aggression / Violence:No  Access to Firearms a concern: No  Gang Involvement:No   Subjective: The pt. Continues with Physical Therapy and is making progress. He is having feet swelling and will have an MRI to look at that. We looked at his relationships with family members and feelings associated with that. Processed healthy boundaries, feelings connected to boundaries and began to look at healthy and assertive communication.  Interventions: Cognitive Behavioral Therapy  Diagnosis: Generalized anxiety disorder  Plan: I will meet with the patient every 2 to 3 weeks via care agility.  Treatment plan: We will use cognitive behavioral therapy as well as dialectical behavior therapy to help the patient reduce anxiety/stress primarily  related to medical issues.  The goal is to reduce anxiety by 50% with a target date of February 12, 2023.  Goals for reducing anxiety include helping him manage thoughts and worrisome thinking contributing to feelings of anxiety as well as recognition of those thoughts.  We will look to resolve any core conflicts outside of the medical and health issues that are contributing to anxiety including family situations and relationships.  In addition to that we will identify causes for anxiety and explore ways to lower it as well as improve his ability to manage anxiety symptoms and better handle stress.  Interventions will be to provide education about anxiety and stress to help him identify causes and symptoms.  Introduce coping skills and problem solution skills to manage anxiety including DBT distress tolerance and mindfulness skills.  We will also use cognitive behavioral therapy to identify and change anxiety provoking thought and behavior patterns.  French Ana, [facility]                  French Ana, [facility]

## 2022-09-03 ENCOUNTER — Encounter: Payer: Self-pay | Admitting: Internal Medicine

## 2022-09-03 DIAGNOSIS — G894 Chronic pain syndrome: Secondary | ICD-10-CM

## 2022-09-03 MED ORDER — TRAMADOL HCL 50 MG PO TABS
ORAL_TABLET | ORAL | 2 refills | Status: DC
Start: 2022-09-03 — End: 2022-12-07

## 2022-09-11 ENCOUNTER — Encounter: Payer: Self-pay | Admitting: Behavioral Health

## 2022-09-11 ENCOUNTER — Ambulatory Visit (INDEPENDENT_AMBULATORY_CARE_PROVIDER_SITE_OTHER): Payer: 59 | Admitting: Behavioral Health

## 2022-09-11 DIAGNOSIS — F411 Generalized anxiety disorder: Secondary | ICD-10-CM

## 2022-09-11 NOTE — Progress Notes (Signed)
[facility] Counselor/Therapist Progress Note  Patient ID: Larry Riley, MRN: [id number],    Date: 09/11/2022  Time Spent: 55 minutes from 10:03 AM until 10:58 AM This session was held via video teletherapy. The patient consented to the video teletherapy and was located in his home office during this session.  He is aware it is the responsibility of the patient to secure confidentiality on his end of the session. The provider was in a private home office for the duration of this session.      Treatment Type: Individual Therapy  Reported Symptoms: Anxiety/stress  Mental Status Exam: Appearance:  Casual     Behavior: Appropriate  Motor: Normal  Speech/Language:  Normal Rate  Affect: Appropriate  Mood: normal  Thought process: normal  Thought content:   WNL  Sensory/Perceptual disturbances:   WNL  Orientation: oriented to person, place, time/date, situation, day of week, and month of year  Attention: Good  Concentration: Good  Memory: WNL  Fund of knowledge:  Good  Insight:   Good  Judgment:  Good  Impulse Control: Good   Risk Assessment: Danger to Self:  No Self-injurious Behavior: No Danger to Others: No Duty to Warn:no Physical Aggression / Violence:No  Access to Firearms a concern: No  Gang Involvement:No   Subjective: The patient is progressing physically.  His pain is decreasing but his mobility and strength are increasing through physical therapy.  He is currently doing that at home but he hopes to progress to where he can go to the physical therapist office where he has access to more to work with.  He went to a doctor's visit yesterday.  His wife drove but they were in the car 30 minutes but he still chose to use the walker to go to the appointment and set up being in the wheelchair and said he did well with that although he was tired.  He is feeling more stable going up and down stairs.  A recent MRI showed a spot on his kidney but the doctor did not  express major concern about it but does want to look at it again in a few months advising the patient to call if there were any other changes.  He got some good news at work and that he was given to new responsibilities which she had been asking for.  He feels that is a positive step in the advancement of his career within the company and is looking forward to continuing these new responsibilities.  We talked about his relationship with his father who he acknowledges has some mental health struggles and how he is setting good boundaries but also is flexing those boundaries as he feels the need to care for his father from states away.  We talked about how he starts building relationships with his siblings again in a way that is gradual. He does contract for safety having no thoughts of hurting himself or anyone else. Interventions: Cognitive Behavioral Therapy  Diagnosis: Generalized anxiety disorder  Plan: I will meet with the patient every 2 to 3 weeks via care agility.  Treatment plan: We will use cognitive behavioral therapy as well as dialectical behavior therapy to help the patient reduce anxiety/stress primarily related to medical issues.  The goal is to reduce anxiety by 50% with a target date of February 12, 2023.  Goals for reducing anxiety include helping him manage thoughts and worrisome thinking contributing to feelings of anxiety as well as recognition of those thoughts.  We will  look to resolve any core conflicts outside of the medical and health issues that are contributing to anxiety including family situations and relationships.  In addition to that we will identify causes for anxiety and explore ways to lower it as well as improve his ability to manage anxiety symptoms and better handle stress.  Interventions will be to provide education about anxiety and stress to help him identify causes and symptoms.  Introduce coping skills and problem solution skills to manage anxiety including DBT  distress tolerance and mindfulness skills.  We will also use cognitive behavioral therapy to identify and change anxiety provoking thought and behavior patterns.  French Ana, [facility]                  French Ana, [facility]               French Ana, [facility]

## 2022-09-23 ENCOUNTER — Ambulatory Visit (INDEPENDENT_AMBULATORY_CARE_PROVIDER_SITE_OTHER): Payer: 59 | Admitting: Behavioral Health

## 2022-09-23 ENCOUNTER — Encounter: Payer: Self-pay | Admitting: Behavioral Health

## 2022-09-23 DIAGNOSIS — F411 Generalized anxiety disorder: Secondary | ICD-10-CM

## 2022-09-23 NOTE — Progress Notes (Signed)
[facility] Counselor/Therapist Progress Note  Patient ID: Larry Riley, MRN: [id number],    Date: 09/23/2022  Time Spent: 56 minutes from 10:02 AM until 10:58 AM This session was held via video teletherapy. The patient consented to the video teletherapy and was located in his home office during this session.  He is aware it is the responsibility of the patient to secure confidentiality on his end of the session. The provider was in a private home office for the duration of this session.      Treatment Type: Individual Therapy  Reported Symptoms: Anxiety/stress  Mental Status Exam: Appearance:  Casual     Behavior: Appropriate  Motor: Normal  Speech/Language:  Normal Rate  Affect: Appropriate  Mood: normal  Thought process: normal  Thought content:   WNL  Sensory/Perceptual disturbances:   WNL  Orientation: oriented to person, place, time/date, situation, day of week, and month of year  Attention: Good  Concentration: Good  Memory: WNL  Fund of knowledge:  Good  Insight:   Good  Judgment:  Good  Impulse Control: Good   Risk Assessment: Danger to Self:  No Self-injurious Behavior: No Danger to Others: No Duty to Warn:no Physical Aggression / Violence:No  Access to Firearms a concern: No  Gang Involvement:No   Subjective: The patient continues to progress physically.  He will finish his in-home physical therapy soon and hopes to be able to start physical therapy at the therapist office where he has an extended number of visits.  He feels that he is getting stronger every day.  He is working full days.  He does get up every couple of hours to walk around.  I reminded him about some of the coping skills for stress and anxiety and encouraged him to practice them throughout the day including adding a mindfulness exercise.  We will expand on that in future sessions.  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  He still is enjoying it.  He will meet with his manager this afternoon but feels that he is doing well.  His father was in the hospital again and he was concerned but he is checking on him almost daily and said that his father presented with some optimism after going through rehabilitation.  The patient is trying to find a balance in how to support his father living so far away.  He also is going to reach out to his sister again which encourage his father to just to attempt to improve that relationship.  He feels that his anxiety is better as his health gets better but there is some inherent with stress and family situations.  We will continue to add coping skills for reducing anxiety and I encouraged him to practice mindfulness.  He asked that I send him some mindfulness information/exercises to [employer]odge [email or web address].  He does contract for safety having no thoughts of hurting himself or anyone else. Interventions: Cognitive Behavioral Therapy  Diagnosis: Generalized anxiety disorder  Plan: I will meet with the patient every 2 to 3 weeks via care agility.  Treatment plan: We will use cognitive behavioral therapy as well as dialectical behavior therapy to help the patient reduce anxiety/stress primarily related to medical issues.  The goal is to reduce anxiety by 50% with a target date of February 12, 2023.  Goals for reducing anxiety include helping him manage  thoughts and worrisome thinking contributing to feelings of anxiety as well as recognition of those thoughts.  We will look to resolve any core conflicts outside of the medical and health issues that are contributing to anxiety including family situations and relationships.  In addition to that we will identify causes for anxiety and explore ways to lower it as well as improve his ability to manage anxiety symptoms and better handle stress.  Interventions will be to provide education  about anxiety and stress to help him identify causes and symptoms.  Introduce coping skills and problem solution skills to manage anxiety including DBT distress tolerance and mindfulness skills.  We will also use cognitive behavioral therapy to identify and change anxiety provoking thought and behavior patterns.  French Ana, [facility]                  French Ana, [facility]               French Ana, [facility]               French Ana, [facility]

## 2022-10-02 ENCOUNTER — Encounter: Payer: Self-pay | Admitting: Behavioral Health

## 2022-10-06 ENCOUNTER — Ambulatory Visit: Payer: Managed Care, Other (non HMO) | Attending: Nurse Practitioner | Admitting: Physical Therapy

## 2022-10-06 ENCOUNTER — Encounter: Payer: Self-pay | Admitting: Physical Therapy

## 2022-10-06 DIAGNOSIS — M6281 Muscle weakness (generalized): Secondary | ICD-10-CM | POA: Insufficient documentation

## 2022-10-06 DIAGNOSIS — R293 Abnormal posture: Secondary | ICD-10-CM | POA: Insufficient documentation

## 2022-10-06 DIAGNOSIS — R2681 Unsteadiness on feet: Secondary | ICD-10-CM | POA: Diagnosis present

## 2022-10-06 DIAGNOSIS — R2689 Other abnormalities of gait and mobility: Secondary | ICD-10-CM | POA: Insufficient documentation

## 2022-10-06 NOTE — Therapy (Signed)
OUTPATIENT PHYSICAL THERAPY NEURO EVALUATION   Patient Name: Larry Riley MRN: [id number] DOB:10-07-1980, 42 y.o., male Today's Date: 10/06/2022   PCP: Larry Riley., MD REFERRING PROVIDER: Juliann Pares, FNP  END OF SESSION:  PT End of Session - 10/06/22 1935     Visit Number 1    Number of Visits 21   3x/wk for 4 wks, 2x/wk for 4 wks = 20 visits total   Date for PT Re-Evaluation 12/04/22    Authorization Type [employer]    Authorization - Visit Number 1    Authorization - Number of Visits 60    PT Start Time 1405    PT Stop Time 1445    PT Time Calculation (min) 40 min    Equipment Utilized During Treatment Gait belt    Activity Tolerance Patient tolerated treatment well    Behavior During Therapy WFL for tasks assessed/performed             Past Medical History:  Diagnosis Date   Anxiety    Depression    GERD (gastroesophageal reflux disease) 01/21/2017   MRSA (methicillin resistant Staphylococcus aureus) 2005   leg   Neurofibromatosis    tumor down spine and brain   Past Surgical History:  Procedure Laterality Date   hydrocephalus     shunt 1999-[place]   NECK SURGERY      tumor removal   SPINE SURGERY  10/2013   c2-t2 fusion  , laminectomy c1-6-- Dr Raynald Kemp-- [place]   VASECTOMY  02/2013   Patient Active Problem List   Diagnosis Date Noted   Peripheral edema 08/17/2022   Hypotension 08/17/2022   Urinary incontinence 07/04/2022   Neurofibroma 04/23/2022   Neoplasm of unspecified behavior of bone, soft tissue, and skin 02/16/2022   Acute hearing loss, right 08/04/2021   Hyperglycemia 08/04/2021   Abscess of right foot 08/16/2020   Cellulitis of right foot 08/16/2020   Microhematuria 08/03/2020   Gastroesophageal reflux disease 07/07/2019   External otitis of right ear 03/09/2017   Thumb laceration, right, initial encounter 03/09/2017   Weakness of left side of body 11/19/2016   Stress due to illness of family member 06/10/2016   Syncope  04/23/2016   Onychomycosis 04/23/2016   Impacted ear wax 04/23/2016   Constipation due to opioid therapy 06/13/2014   Chronic pain syndrome 02/21/2014   Allergic reaction 02/20/2014   Neurofibromatosis, type 1 (HCC) 12/14/2013   Status post cervical arthrodesis 11/21/2013   Cervical myelopathy (HCC) 09/21/2013   Encounter for well adult exam with abnormal findings 06/23/2010   HORNER'S SYNDROME 04/12/2009   Other psoriasis 08/24/2007   Tinea pedis 08/24/2007    ONSET DATE: 04-23-22  REFERRING DIAG:  Diagnosis  D49.2 (ICD-10-CM) - Neoplasm of unspecified behavior of bone, soft tissue, and skin    THERAPY DIAG:  Other abnormalities of gait and mobility  Muscle weakness (generalized)  Unsteadiness on feet  Abnormal posture  Rationale for Evaluation and Treatment: Rehabilitation  SUBJECTIVE STATEMENT:  "Larry Riley"   Pt presents for PT eval using RW for assistance with ambulation; accompanied by his wife, Larry Riley. Pt underwent T11-L1  and L3-L5 laminectomies on 4-11 & 4-12 for intradural tumor resections due to neurofibromatosis.  Pt was hospitalized at [facility] 04-23-22 - 04-29-22.  Insurance denied admission to inpatient rehab so pt was discharged to SNF for additional rehab prior to returning home (04-29-22 - 06-22-22).  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  Pt is wearing AFO on LLE- states he obtained this brace approx. 4 yrs ago   Pt accompanied by:  spouse, Larry Riley  PERTINENT HISTORY: s/p T11-L1 Laminectomies for intradural tumor resections & L3-L5 laminectomies for intradural tumor resections (hospitalization 04-23-22 - 04-29-22;  Neurofibromatosis 1 diagnosed in 1995  History of foot surgery 08/2020  Patient had  metal removed from R foot and infected tissue removed  Neurofibromatosis (CMS/HCC)  Neurofibromatosis, type 1 (von Recklinghausen's disease) (CMS/HCC)  Status post cervical arthrodesis 11/21/2013 - cervical fusion C2-T2, laminectomy C1- C6   PAIN:  Are you having pain? Yes: NPRS scale: 3-4/10 Pain location: low back; tense in shoulders Aggravating factors: sitting in recliner chair Relieving factors: Gabapentin Pain was 7-8/10 prior to surgery (on average day) - pt reports pain is much improved since surgery  PRECAUTIONS: Fall  RED FLAGS: Bowel or bladder incontinence: Yes: Improving - pt states he has mostly bladder incontinence at night; little control over starting/stopping  WEIGHT BEARING RESTRICTIONS: No  FALLS: Has patient fallen in last 6 months? Yes. Number of falls 2  LIVING ENVIRONMENT: Lives with: lives with their spouse Lives in: House/apartment Stairs: Yes: External: 2 steps; can reach both Has following equipment at home: Dan Humphreys - 2 wheeled, Wheelchair (manual), Tour manager, and hemiwalker  PLOF: Independent with basic ADLs, Independent with household mobility without device, Independent with community mobility without device, and Independent with transfers; pt drove prior to surgery on 04-23-22 (has not driven since)  PATIENT GOALS: "walk without use of device, as I was prior to surgery"; work on balance  OBJECTIVE:   DIAGNOSTIC FINDINGS: N/A  COGNITION: Overall cognitive status: Within functional limits for tasks assessed   SENSATION: Impaired in bil. LE's   COORDINATION: Decreased in bil. LE's wi  POSTURE: forward head, decreased thoracic kyphosis, and head is laterally flexed to Lt side  LOWER EXTREMITY ROM:   WFL's except for Lt ankle ROM - pt has worn AFO for > 4 yrs Decreased Rt active dorsiflexion and plantarflexion due to weakness  LOWER EXTREMITY MMT:    MMT Right Eval Left Eval  Hip flexion 5 5  Hip extension    Hip abduction    Hip  adduction    Hip internal rotation    Hip external rotation    Knee flexion 4+ (seated position) 4 (seated position)  Knee extension 5 5  Ankle dorsiflexion 3- 0-1  Ankle plantarflexion    Ankle inversion    Ankle eversion    (Blank rows = not tested)  BED MOBILITY:  Modified independent   TRANSFERS: Assistive device utilized: Environmental consultant - 2 wheeled  Sit to stand: Modified independence short distances; SBA community amb. With RW Stand to sit: Modified independence  CURB: TBA  STAIRS:  TBA  GAIT: Gait pattern: step through pattern, decreased ankle dorsiflexion- Right, decreased ankle dorsiflexion- Left, genu recurvatum- Left, and trunk rotated posterior- Left Distance walked: 7'  Assistive device utilized: Environmental consultant - 2 wheeled Level of assistance: SBA Comments: AFO worn on LLE - carbon fiber with an anterior guard added (appears to be custom fabricated - pt states he obtained this brace approx. 4 yrs ago)  FUNCTIONAL TESTS:  Timed up and go (TUG): 18.97 secs with RW but pt was not safe - pt was instructed to ambulate at his normal speed but ambulated at fast pace and was unsteady with turns so score is not totally accurate - CGA was provided for safety 10 meter walk test: 15.94 secs with RW = 2.06 ft/sec Berg Balance Scale: 26/56  [facility] PT Assessment - 10/06/22 0001       Standardized Balance Assessment   Standardized Balance Assessment Berg Balance Test      Berg Balance Test   Sit to Stand Able to stand  independently using hands    Standing Unsupported Able to stand 2 minutes with supervision    Sitting with Back Unsupported but Feet Supported on Floor or Stool Able to sit safely and securely 2 minutes    Stand to Sit Controls descent by using hands    Transfers Able to transfer safely, definite need of hands    Standing Unsupported with Eyes Closed Able to stand 3 seconds    Standing Unsupported with Feet Together Needs help to attain position but able to stand for 30  seconds with feet together    From Standing, Reach Forward with Outstretched Arm Reaches forward but needs supervision    From Standing Position, Pick up Object from Floor Unable to pick up and needs supervision    From Standing Position, Turn to Look Behind Over each Shoulder Needs supervision when turning    Turn 360 Degrees Needs close supervision or verbal cueing    Standing Unsupported, Alternately Place Feet on Step/Stool Able to complete >2 steps/needs minimal assist    Standing Unsupported, One Foot in Front Able to take small step independently and hold 30 seconds    Standing on One Leg Unable to try or needs assist to prevent fall    Total Score 26             PATIENT SURVEYS:  N/A for his diagnosis - s/p laminectomies for tumor resections  TODAY'S TREATMENT:                                                                                                                              DATE: Eval only    PATIENT EDUCATION: Education details: Eval results with POC and frequency discussed with pt & wife; discussed possible aquatic PT in 4-6 weeks - pt is interested; reviewed exercises issued by home health Person educated: Patient and Spouse Education method: Explanation Education comprehension: verbalized understanding  HOME EXERCISE PROGRAM: To be established  GOALS: Goals reviewed with patient? Yes  SHORT TERM GOALS: Target date: 11-06-22 (4 weeks)  Pt will perform sit to stand transfer with 1 UE  support only from standard chair and will stabilize upon initial standing without bracing with legs. Baseline:  bil. UE support needed from mat table Goal status: INITIAL  2.  Improve Berg balance test score to >/= 31/56 to demo improved standing balance. Baseline: 26/56 Goal status: INITIAL  3.  Pt will amb. 115' with hemiwalker with CGA on flat, even surface. Baseline: gait with hemiwalker to be assessed Goal status: INITIAL  4.  Increase gait velocity to >/= 2.4  ft/sec with RW for increased gait efficiency. Baseline: 15.94 secs = 2.06 ft/sec Goal status: INITIAL  5.  Improve TUG score to </= 17 secs with RW with pt demonstrating correct hand placement with sit to/from stand transfers and safe speed, I.e. not rushing. Baseline: 18.97 secs with RW but not safe Goal status: INITIAL  6.  Independent with HEP for balance and strengthening exercises. Baseline:  Goal status: INITIAL  LONG TERM GOALS: Target date: 12-04-22 (8 weeks)  Pt will perform sit to stand transfer without UE support from standard chair and will stabilize upon initial standing without bracing with legs. Baseline:  bil. UE support needed from mat table Goal status: INITIAL   2.  Pt will amb. 350' with quad cane with supervision on flat, even surface for increased community accessibility. Baseline: pt using RW- modified independent household, SBA community amb. Goal status: INITIAL  3.  Improve Berg balance test score to >/= 36/56 to demo improved standing balance. Baseline: 26/56 Goal status: INITIAL  4.   Increase gait velocity to >/= 3.0 ft/sec with RW for increased gait efficiency. Baseline: 15.94 secs = 2.06 ft/sec Goal status: INITIAL  5.  Improve TUG score to </= 15 secs with hemiwalker (or quad cane) with pt demonstrating correct hand placement with sit to/from stand transfers and safe speed, I.e. not rushing. Baseline: 18.97 secs with RW but not safe Goal status: INITIAL  6.  Transfer floor to stand with UE support on sturdy object or furniture with supervision. Baseline: TBA Goal status: INITIAL  ASSESSMENT:  CLINICAL IMPRESSION: Patient is a 42 y.o. gentleman who was seen today for physical therapy evaluation and treatment for gait and balance dysfunction due to s/p laminectomies (T11-L1 & L3-L5) for intradural tumor resections on 04-23-22 due to Neurofibromatosis 1. PMH includes s/p cervical fusion and cervical laminectomy in Nov. 2015 due to tumor due to  neurofibromatosis, which was initially diagnosed in 1995. Pt has Lt foot drop and is wearing Lt AFO which he says was obtained approx. 4 years ago and is currently using a RW.  Pt is at high risk for fall with Berg balance test score 26/56 and TUG score 18.97 secs, however, pt was unsafe during this functional assessment due to ambulating at fast pace and having unsteadiness with the turns.  Pt also presents with gait and postural deviations including Lt knee genu recurvatum in stance, decreased Rt dorsiflexion in swing and postural deviations including forward head posture with head laterally flexed to Lt side.  Pt will benefit from PT to address LE weakness, gait and balance deficits.  OBJECTIVE IMPAIRMENTS: Abnormal gait, decreased activity tolerance, decreased balance, decreased coordination, decreased knowledge of use of DME, decreased strength, decreased safety awareness, and impaired sensation.   ACTIVITY LIMITATIONS: carrying, lifting, bending, standing, squatting, stairs, transfers, locomotion level, and driving  PARTICIPATION LIMITATIONS: meal prep, cleaning, laundry, driving, shopping, community activity, and yard work  PERSONAL FACTORS: Past/current experiences and 1 comorbidity: h/o cervical tumors with s/p cervical fusion  are also affecting  patient's functional outcome.   REHAB POTENTIAL: Good  CLINICAL DECISION MAKING: Evolving/moderate complexity  EVALUATION COMPLEXITY: Moderate  PLAN:  PT FREQUENCY:  3x/week for 4 wks followed by 2x/wk for 4 wks  PT DURATION: 8 weeks  PLANNED INTERVENTIONS: Therapeutic exercises, Therapeutic activity, Neuromuscular re-education, Balance training, Gait training, Patient/Family education, Self Care, Stair training, Orthotic/Fit training, DME instructions, and Aquatic Therapy  PLAN FOR NEXT SESSION: please issue standing balance exercises - hip kicks 3 directions, marching, sit to stand, etc. ; assess gait with hemiwalker or other  LRAD   Kary Kos, PT 10/06/2022, 7:42 PM

## 2022-10-13 ENCOUNTER — Encounter: Payer: Self-pay | Admitting: Physical Therapy

## 2022-10-13 ENCOUNTER — Ambulatory Visit: Payer: Managed Care, Other (non HMO) | Attending: Nurse Practitioner | Admitting: Physical Therapy

## 2022-10-13 VITALS — BP 115/85 | HR 80

## 2022-10-13 DIAGNOSIS — R293 Abnormal posture: Secondary | ICD-10-CM | POA: Diagnosis present

## 2022-10-13 DIAGNOSIS — R2681 Unsteadiness on feet: Secondary | ICD-10-CM | POA: Insufficient documentation

## 2022-10-13 DIAGNOSIS — R278 Other lack of coordination: Secondary | ICD-10-CM | POA: Diagnosis present

## 2022-10-13 DIAGNOSIS — M6281 Muscle weakness (generalized): Secondary | ICD-10-CM | POA: Diagnosis present

## 2022-10-13 DIAGNOSIS — R2689 Other abnormalities of gait and mobility: Secondary | ICD-10-CM | POA: Diagnosis present

## 2022-10-13 NOTE — Therapy (Signed)
OUTPATIENT PHYSICAL THERAPY NEURO TREATMENT   Patient Name: Larry Riley MRN: [id number] DOB:08/30/1980, 42 y.o., male Today's Date: 10/13/2022   PCP: Corwin Levins., MD REFERRING PROVIDER: Juliann Pares, FNP  END OF SESSION:  PT End of Session - 10/13/22 1618     Visit Number 2    Number of Visits 21    Date for PT Re-Evaluation 12/04/22    Authorization Type [employer]    Authorization - Visit Number 2    Authorization - Number of Visits 60    PT Start Time 1616    PT Stop Time 1700    PT Time Calculation (min) 44 min    Equipment Utilized During Treatment Gait belt    Activity Tolerance Patient tolerated treatment well    Behavior During Therapy WFL for tasks assessed/performed             Past Medical History:  Diagnosis Date   Anxiety    Depression    GERD (gastroesophageal reflux disease) 01/21/2017   MRSA (methicillin resistant Staphylococcus aureus) 2005   leg   Neurofibromatosis    tumor down spine and brain   Past Surgical History:  Procedure Laterality Date   hydrocephalus     shunt 1999-[place]   NECK SURGERY      tumor removal   SPINE SURGERY  10/2013   c2-t2 fusion  , laminectomy c1-6-- Dr Raynald Kemp-- [place]   VASECTOMY  02/2013   Patient Active Problem List   Diagnosis Date Noted   Peripheral edema 08/17/2022   Hypotension 08/17/2022   Urinary incontinence 07/04/2022   Neurofibroma 04/23/2022   Neoplasm of unspecified behavior of bone, soft tissue, and skin 02/16/2022   Acute hearing loss, right 08/04/2021   Hyperglycemia 08/04/2021   Abscess of right foot 08/16/2020   Cellulitis of right foot 08/16/2020   Microhematuria 08/03/2020   Gastroesophageal reflux disease 07/07/2019   External otitis of right ear 03/09/2017   Thumb laceration, right, initial encounter 03/09/2017   Weakness of left side of body 11/19/2016   Stress due to illness of family member 06/10/2016   Syncope 04/23/2016   Onychomycosis 04/23/2016   Impacted ear  wax 04/23/2016   Constipation due to opioid therapy 06/13/2014   Chronic pain syndrome 02/21/2014   Allergic reaction 02/20/2014   Neurofibromatosis, type 1 (HCC) 12/14/2013   Status post cervical arthrodesis 11/21/2013   Cervical myelopathy (HCC) 09/21/2013   Encounter for well adult exam with abnormal findings 06/23/2010   Disorder of autonomic nervous system 04/12/2009   Other psoriasis 08/24/2007   Tinea pedis 08/24/2007    ONSET DATE: 04-23-22  REFERRING DIAG:  Diagnosis  D49.2 (ICD-10-CM) - Neoplasm of unspecified behavior of bone, soft tissue, and skin    THERAPY DIAG:  Other abnormalities of gait and mobility  Muscle weakness (generalized)  Unsteadiness on feet  Rationale for Evaluation and Treatment: Rehabilitation  SUBJECTIVE STATEMENT:  "Larry Riley"  Patient presents to PT with 2WW. Denies falls/near falls. Patient reports otherwise doing well.  Pt accompanied by:  spouse, [name]  PERTINENT HISTORY: s/p T11-L1 Laminectomies for intradural tumor resections & L3-L5 laminectomies for intradural tumor resections (hospitalization 04-23-22 - 04-29-22;  Neurofibromatosis 1 diagnosed in 1995  History of foot surgery 08/2020  Patient had metal removed from R foot and infected tissue removed  Neurofibromatosis (CMS/HCC)  Neurofibromatosis, type 1 (von Recklinghausen's disease) (CMS/HCC)  Status post cervical arthrodesis 11/21/2013 - cervical fusion C2-T2, laminectomy C1- C6  PAIN:  Are you having pain? Yes: NPRS scale: 3/10 Pain location: low back; tense in shoulders Aggravating factors: sitting in recliner chair Relieving factors: Gabapentin Pain was 7-8/10 prior to surgery (on average day) - pt reports pain is much improved since surgery  PRECAUTIONS: Fall  RED FLAGS: Bowel or bladder  incontinence: Yes: Improving - pt states he has mostly bladder incontinence at night; little control over starting/stopping  WEIGHT BEARING RESTRICTIONS: No  FALLS: Has patient fallen in last 6 months? Yes. Number of falls 2  LIVING ENVIRONMENT: Lives with: lives with their spouse Lives in: House/apartment Stairs: Yes: External: 2 steps; can reach both Has following equipment at home: Dan Humphreys - 2 wheeled, Wheelchair (manual), Tour manager, and hemiwalker  PLOF: Independent with basic ADLs, Independent with household mobility without device, Independent with community mobility without device, and Independent with transfers; pt drove prior to surgery on 04-23-22 (has not driven since)  PATIENT GOALS: "walk without use of device, as I was prior to surgery"; work on balance  OBJECTIVE:   DIAGNOSTIC FINDINGS: N/A  COGNITION: Overall cognitive status: Within functional limits for tasks assessed   SENSATION: Impaired in bil. LE's   COORDINATION: Decreased in bil. LE's wi  POSTURE: forward head, decreased thoracic kyphosis, and head is laterally flexed to Lt side  LOWER EXTREMITY ROM:   WFL's except for Lt ankle ROM - pt has worn AFO for > 4 yrs Decreased Rt active dorsiflexion and plantarflexion due to weakness  LOWER EXTREMITY MMT:    MMT Right Eval Left Eval  Hip flexion 5 5  Hip extension    Hip abduction    Hip adduction    Hip internal rotation    Hip external rotation    Knee flexion 4+ (seated position) 4 (seated position)  Knee extension 5 5  Ankle dorsiflexion 3- 0-1  Ankle plantarflexion    Ankle inversion    Ankle eversion    (Blank rows = not tested)  BED MOBILITY:  Modified independent   TRANSFERS: Assistive device utilized: Environmental consultant - 2 wheeled  Sit to stand: Modified independence short distances; SBA community amb. With RW Stand to sit: Modified independence  CURB: TBA  STAIRS:  TBA  GAIT: Gait pattern: step through pattern, decreased ankle  dorsiflexion- Right, decreased ankle dorsiflexion- Left, genu recurvatum- Left, and trunk rotated posterior- Left Distance walked: 53' Assistive device utilized: Environmental consultant - 2 wheeled Level of assistance: SBA Comments: AFO worn on LLE - carbon fiber with an anterior guard added (appears to be custom fabricated - pt states he obtained this brace approx. 4 yrs ago)  FUNCTIONAL TESTS:  Timed up and go (TUG): 18.97 secs with RW but pt was not safe - pt was instructed to ambulate at his normal speed but ambulated at fast pace and was unsteady with turns so score is not totally accurate - CGA was provided for safety 10 meter walk test: 15.94 secs with RW =  2.06 ft/sec Berg Balance Scale: 26/56    PATIENT SURVEYS:  N/A for his diagnosis - s/p laminectomies for tumor resections  TODAY'S TREATMENT:                                                                                                                               Vitals:   10/13/22 1625  BP: 115/85  Pulse: 80   TherEx:   Created HEP and reviewed during session: - Sit to Stand  -  2 sets - 5 reps - Sit to Stand with Resistance Around Legs from elevated surface - 2 - 5 reps (green band on airex to minimize knee valgus) - Standing March with Unilateral Counter Support  - 2 sets - 20 reps - Standing 3-Way Leg Reach with Resistance at Ankles and Counter Support  - 2-3 sets - 10 reps (trialed with and without greenband, do either depending on fatigue level)   Gait: GAIT: Gait pattern: step through pattern, decreased ankle dorsiflexion- Right, decreased ankle dorsiflexion- Left, genu recurvatum- Left, and trunk rotated posterior- Left Distance walked: 1 x 115' Assistive device utilized: Environmental consultant - 2 wheeled Level of assistance: SBA Comments: AFO worn on LLE - carbon fiber with an anterior guard added (appears to be custom fabricated - pt states he obtained this brace approx. 4 yrs ago); decreased safety with sitting in chair for transfers  noted  GAIT: Gait pattern: step through pattern, decreased ankle dorsiflexion- Right, decreased ankle dorsiflexion- Left, genu recurvatum- Left, and trunk rotated posterior- Left Distance walked: 1 x 230' Assistive device utilized: hemiwalker on right side Level of assistance: CGA Comments: AFO worn on LLE - carbon fiber with an anterior guard added (appears to be custom fabricated - pt states he obtained this brace approx. 4 yrs ago); required cues on pacing as patient tends to rock of leg rests, no major LOB but get throws of balance lightly with bulk of hemiwalker  GAIT: Gait pattern: step through pattern, decreased ankle dorsiflexion- Right, decreased ankle dorsiflexion- Left, genu recurvatum- Left, and trunk rotated posterior- Left Distance walked: 1 x 115' Assistive device utilized: SBQC on right side Level of assistance: CGA Comments: AFO worn on LLE - carbon fiber with an anterior guard, improved maneuverability, no major LOB but increased weightbearing/grip through UE, mild lateral instability  PATIENT EDUCATION: Education details: Initial HEP  Person educated: Patient and Spouse Education method: Explanation Education comprehension: verbalized understanding  HOME EXERCISE PROGRAM:  Access Code: [email or web address] URL: [email or web address] Date: 10/13/2022 Prepared by: Maryruth Eve  Exercises - Sit to Stand  - 1 x daily - 7 x weekly - 2-3 sets - 10 reps - Sit to Stand with Resistance Around Legs  - 1 x daily - 7 x weekly - 2-3 sets - 5 reps - Standing March with Unilateral Counter Support  - 1 x daily - 7 x weekly - 3 sets - 20 reps - Standing 3-Way Leg Reach with Resistance at  Ankles and Counter Support  - 1 x daily - 7 x weekly - 2-3 sets - 10 reps  Walking with hemiwalker with gait belt and spouse supervision ~1x day  GOALS: Goals reviewed with patient? Yes  SHORT TERM GOALS: Target date: 11-06-22 (4 weeks)  Pt will perform sit to stand transfer with 1  UE support only from standard chair and will stabilize upon initial standing without bracing with legs. Baseline:  bil. UE support needed from mat table Goal status: INITIAL  2.  Improve Berg balance test score to >/= 31/56 to demo improved standing balance. Baseline: 26/56 Goal status: INITIAL  3.  Pt will amb. 115' with hemiwalker with CGA on flat, even surface. Baseline: gait with hemiwalker to be assessed Goal status: INITIAL  4.  Increase gait velocity to >/= 2.4 ft/sec with RW for increased gait efficiency. Baseline: 15.94 secs = 2.06 ft/sec Goal status: INITIAL  5.  Improve TUG score to </= 17 secs with RW with pt demonstrating correct hand placement with sit to/from stand transfers and safe speed, I.e. not rushing. Baseline: 18.97 secs with RW but not safe Goal status: INITIAL  6.  Independent with HEP for balance and strengthening exercises. Baseline:  Goal status: INITIAL  LONG TERM GOALS: Target date: 12-04-22 (8 weeks)  Pt will perform sit to stand transfer without UE support from standard chair and will stabilize upon initial standing without bracing with legs. Baseline:  bil. UE support needed from mat table Goal status: INITIAL   2.  Pt will amb. 350' with quad cane with supervision on flat, even surface for increased community accessibility. Baseline: pt using RW- modified independent household, SBA community amb. Goal status: INITIAL  3.  Improve Berg balance test score to >/= 36/56 to demo improved standing balance. Baseline: 26/56 Goal status: INITIAL  4.   Increase gait velocity to >/= 3.0 ft/sec with RW for increased gait efficiency. Baseline: 15.94 secs = 2.06 ft/sec Goal status: INITIAL  5.  Improve TUG score to </= 15 secs with hemiwalker (or quad cane) with pt demonstrating correct hand placement with sit to/from stand transfers and safe speed, I.e. not rushing. Baseline: 18.97 secs with RW but not safe Goal status: INITIAL  6.  Transfer floor  to stand with UE support on sturdy object or furniture with supervision. Baseline: TBA Goal status: INITIAL  ASSESSMENT:  CLINICAL IMPRESSION: Skilled PT session emphasized gait training with assessment of appropriate AD. Patient able to safely ambulate short distances with CGA with use of hemiwalker and SBQC; recommend further assessment to determine most appropriate AD moving forward as patient does exibit still mild lateral instability with reduction in AD. Initiated LE HEP with emphasis on strengthening; will plan to incorporate more balance work in future sessions. Patient demonstrates increased knee valgus to stabilize when coming to stand; ultilized resistance and elevated surface to minimize compensation. Plan to progress as able. Patient will benefit from PT to address LE weakness, gait and balance deficits. Continue POC.   OBJECTIVE IMPAIRMENTS: Abnormal gait, decreased activity tolerance, decreased balance, decreased coordination, decreased knowledge of use of DME, decreased strength, decreased safety awareness, and impaired sensation.   ACTIVITY LIMITATIONS: carrying, lifting, bending, standing, squatting, stairs, transfers, locomotion level, and driving  PARTICIPATION LIMITATIONS: meal prep, cleaning, laundry, driving, shopping, community activity, and yard work  PERSONAL FACTORS: Past/current experiences and 1 comorbidity: h/o cervical tumors with s/p cervical fusion  are also affecting patient's functional outcome.   REHAB POTENTIAL: Good  CLINICAL  DECISION MAKING: Evolving/moderate complexity  EVALUATION COMPLEXITY: Moderate  PLAN:  PT FREQUENCY:  3x/week for 4 wks followed by 2x/wk for 4 wks  PT DURATION: 8 weeks  PLANNED INTERVENTIONS: Therapeutic exercises, Therapeutic activity, Neuromuscular re-education, Balance training, Gait training, Patient/Family education, Self Care, Stair training, Orthotic/Fit training, DME instructions, and Aquatic Therapy  PLAN FOR NEXT  SESSION:  assess gait with hemiwalker or other LRAD (progress as able), work on eccentric knee control and proximal stability, stepping reaction, add balance work to [employer], review initial HEP as needed  Carmelia Bake, PT, DPT 10/13/2022, 5:28 PM

## 2022-10-15 ENCOUNTER — Encounter: Payer: Self-pay | Admitting: Physical Therapy

## 2022-10-15 ENCOUNTER — Ambulatory Visit: Payer: Managed Care, Other (non HMO) | Admitting: Physical Therapy

## 2022-10-15 DIAGNOSIS — M6281 Muscle weakness (generalized): Secondary | ICD-10-CM

## 2022-10-15 DIAGNOSIS — R2681 Unsteadiness on feet: Secondary | ICD-10-CM

## 2022-10-15 DIAGNOSIS — R2689 Other abnormalities of gait and mobility: Secondary | ICD-10-CM

## 2022-10-15 NOTE — Therapy (Signed)
OUTPATIENT PHYSICAL THERAPY NEURO TREATMENT   Patient Name: Taner Rzepka MRN: [id number] DOB:1980/11/07, 42 y.o., male Today's Date: 10/15/2022   PCP: Corwin Levins., MD REFERRING PROVIDER: Juliann Pares, FNP  END OF SESSION:  PT End of Session - 10/15/22 1856     Visit Number 3    Number of Visits 21    Date for PT Re-Evaluation 12/04/22    Authorization Type [employer]    Authorization - Number of Visits 60    PT Start Time 1450    PT Stop Time 1534    PT Time Calculation (min) 44 min    Equipment Utilized During Treatment Gait belt;Other (comment)   SBQC, SPC with rubber quad base   Activity Tolerance Patient tolerated treatment well    Behavior During Therapy WFL for tasks assessed/performed              Past Medical History:  Diagnosis Date   Anxiety    Depression    GERD (gastroesophageal reflux disease) 01/21/2017   MRSA (methicillin resistant Staphylococcus aureus) 2005   leg   Neurofibromatosis    tumor down spine and brain   Past Surgical History:  Procedure Laterality Date   hydrocephalus     shunt 1999-[place]   NECK SURGERY      tumor removal   SPINE SURGERY  10/2013   c2-t2 fusion  , laminectomy c1-6-- Dr Raynald Kemp-- [place]   VASECTOMY  02/2013   Patient Active Problem List   Diagnosis Date Noted   Peripheral edema 08/17/2022   Hypotension 08/17/2022   Urinary incontinence 07/04/2022   Neurofibroma 04/23/2022   Neoplasm of unspecified behavior of bone, soft tissue, and skin 02/16/2022   Acute hearing loss, right 08/04/2021   Hyperglycemia 08/04/2021   Abscess of right foot 08/16/2020   Cellulitis of right foot 08/16/2020   Microhematuria 08/03/2020   Gastroesophageal reflux disease 07/07/2019   External otitis of right ear 03/09/2017   Thumb laceration, right, initial encounter 03/09/2017   Weakness of left side of body 11/19/2016   Stress due to illness of family member 06/10/2016   Syncope 04/23/2016   Onychomycosis 04/23/2016    Impacted ear wax 04/23/2016   Constipation due to opioid therapy 06/13/2014   Chronic pain syndrome 02/21/2014   Allergic reaction 02/20/2014   Neurofibromatosis, type 1 (HCC) 12/14/2013   Status post cervical arthrodesis 11/21/2013   Cervical myelopathy (HCC) 09/21/2013   Encounter for well adult exam with abnormal findings 06/23/2010   Disorder of autonomic nervous system 04/12/2009   Other psoriasis 08/24/2007   Tinea pedis 08/24/2007    ONSET DATE: 04-23-22  REFERRING DIAG:  Diagnosis  D49.2 (ICD-10-CM) - Neoplasm of unspecified behavior of bone, soft tissue, and skin    THERAPY DIAG:  Other abnormalities of gait and mobility  Muscle weakness (generalized)  Unsteadiness on feet  Rationale for Evaluation and Treatment: Rehabilitation  SUBJECTIVE STATEMENT:  "[name]"  Patient reports he is a little tired today - has been working since 7:30 this morning - but is doing OK.  Reports he was pleased that he was able to walk 2 laps with [facility] in previous session this week.  Pt accompanied by:  spouse, [name]  PERTINENT HISTORY: s/p T11-L1 Laminectomies for intradural tumor resections & L3-L5 laminectomies for intradural tumor resections (hospitalization 04-23-22 - 04-29-22;  Neurofibromatosis 1 diagnosed in 1995  History of foot surgery 08/2020  Patient had metal removed from R foot and infected tissue removed  Neurofibromatosis (CMS/HCC)  Neurofibromatosis, type 1 (von Recklinghausen's disease) (CMS/HCC)  Status post cervical arthrodesis 11/21/2013 - cervical fusion C2-T2, laminectomy C1- C6  PAIN:  Are you having pain? Yes: NPRS scale: 3/10 Pain location: low back; tense in shoulders Aggravating factors: sitting in recliner chair Relieving factors: Gabapentin Pain was 7-8/10 prior to surgery (on  average day) - pt reports pain is much improved since surgery  PRECAUTIONS: Fall  RED FLAGS: Bowel or bladder incontinence: Yes: Improving - pt states he has mostly bladder incontinence at night; little control over starting/stopping  WEIGHT BEARING RESTRICTIONS: No  FALLS: Has patient fallen in last 6 months? Yes. Number of falls 2  LIVING ENVIRONMENT: Lives with: lives with their spouse Lives in: House/apartment Stairs: Yes: External: 2 steps; can reach both Has following equipment at home: Dan Humphreys - 2 wheeled, Wheelchair (manual), Tour manager, and hemiwalker  PLOF: Independent with basic ADLs, Independent with household mobility without device, Independent with community mobility without device, and Independent with transfers; pt drove prior to surgery on 04-23-22 (has not driven since)  PATIENT GOALS: "walk without use of device, as I was prior to surgery"; work on balance  OBJECTIVE:   DIAGNOSTIC FINDINGS: N/A  COGNITION: Overall cognitive status: Within functional limits for tasks assessed   SENSATION: Impaired in bil. LE's   COORDINATION: Decreased in bil. LE's wi  POSTURE: forward head, decreased thoracic kyphosis, and head is laterally flexed to Lt side  LOWER EXTREMITY ROM:   WFL's except for Lt ankle ROM - pt has worn AFO for > 4 yrs Decreased Rt active dorsiflexion and plantarflexion due to weakness  LOWER EXTREMITY MMT:    MMT Right Eval Left Eval  Hip flexion 5 5  Hip extension    Hip abduction    Hip adduction    Hip internal rotation    Hip external rotation    Knee flexion 4+ (seated position) 4 (seated position)  Knee extension 5 5  Ankle dorsiflexion 3- 0-1  Ankle plantarflexion    Ankle inversion    Ankle eversion    (Blank rows = not tested)  BED MOBILITY:  Modified independent   TRANSFERS: Assistive device utilized: Environmental consultant - 2 wheeled  Sit to stand: Modified independence short distances; SBA community amb. With RW Stand to sit:  Modified independence  CURB: TBA  STAIRS:  TBA  GAIT: Gait pattern: step through pattern, decreased ankle dorsiflexion- Right, decreased ankle dorsiflexion- Left, genu recurvatum- Left, and trunk rotated posterior- Left Distance walked: 44' Assistive device utilized: Environmental consultant - 2 wheeled Level of assistance: SBA Comments: AFO worn on LLE - carbon fiber with an anterior guard added (appears to be custom fabricated - pt states he obtained this brace approx. 4 yrs ago)  FUNCTIONAL TESTS:  Timed up and go (TUG): 18.97 secs with RW but pt was not safe - pt was instructed to ambulate at his normal speed but ambulated at fast  pace and was unsteady with turns so score is not totally accurate - CGA was provided for safety 10 meter walk test: 15.94 secs with RW = 2.06 ft/sec Berg Balance Scale: 26/56 3-4   PATIENT SURVEYS:  N/A for his diagnosis - s/p laminectomies for tumor resections  TODAY'S TREATMENT:      10-15-22 Pt using RW for assistance with gait                                                                                                                           TherEx:   Removed Lt shoe and AFO to assess dorsflexion and plantarflexion - pt able to perform min. Active dorsiflexion and plantarflexion -MMT grade 2-/5 for each muscle group  Pt performed Lt hamstring strengthening in prone positon 10 reps with min assist to hold lower leg in neutral position and to assist with full ROM as pt had difficulty flexing Lt knee past approx. 90 degrees Seated position - pt performed Lt knee flexion with red theraband 10 reps with 3-4 sec hold   Neuro Re-ed: Pt performed Lt knee extension control exercise in standing inside // bars with green theraband - 15 reps in bilateral stance, Lt foot forward stance, and Lt foot backward stance - to facilitate knee control in stance with reduced hyperextension; issued this exercise for HEP  Pt performed stepping strategy exercise with gradual reduction  in UE support on // bars - stepping forward/back with RLE, stepping out/in with RLE and stepping back/forward with RLE - all 10 reps each for improved LLE SLS - prior to leaving // bars ----  Pt gait trained 10' x 2 reps forward/backwards inside bars with min. UE support to work on dynamic balance (pt needed seated rest period after this exercise)  GAIT: Gait pattern: step through pattern, decreased ankle dorsiflexion- Right, decreased ankle dorsiflexion- Left, genu recurvatum- Left, and trunk rotated posterior- Left Distance walked: 1 x 115' with SBQC:   approx. 63' with SPC with rubber quad tip (at end of session) Assistive device utilized: [facility]; SPC with rubber quad base trialed at end of session (min to mod assist needed for safety) - increased unsteadiness noted partially due to fatigue (as well as weakness in RLE) Level of assistance: CGA to min assist with SBQC:  min to mod assist with SPC with quad tip  Comments: AFO worn on LLE - carbon fiber with an anterior guard added (appears to be custom fabricated - pt states he obtained this brace approx. 4 yrs ago); Lt genu recurvatum noted in stance with use of RW   Access Code: [email or web address] - added to HEP URL: [email or web address] Date: 10/15/2022 Prepared by: Maebelle Munroe  Exercises - Seated Hamstring Curl with Anchored Resistance  - 1 x daily - 7 x weekly - 1-2 sets - 10 reps - 2-3 sec hold - Standing Terminal Knee Extension with Resistance  - 1 x daily - 7 x weekly - 3 sets - 15 reps  PATIENT EDUCATION: Education details: Added seated Lt knee flexion with red theraband and knee extension control ex. With green theraband to HEP [facility]); informed pt and his wife to order [facility] to have as pt able to use this device at home (with assistance) in near future Person educated: Patient and Spouse Education method: Explanation Education comprehension: verbalized understanding  HOME EXERCISE PROGRAM:  Access Code:  [email or web address] URL: [email or web address] Date: 10/13/2022 Prepared by: Maryruth Eve  Exercises - Sit to Stand  - 1 x daily - 7 x weekly - 2-3 sets - 10 reps - Sit to Stand with Resistance Around Legs  - 1 x daily - 7 x weekly - 2-3 sets - 5 reps - Standing March with Unilateral Counter Support  - 1 x daily - 7 x weekly - 3 sets - 20 reps - Standing 3-Way Leg Reach with Resistance at Ankles and Counter Support  - 1 x daily - 7 x weekly - 2-3 sets - 10 reps  Walking with hemiwalker with gait belt and spouse supervision ~1x day  GOALS: Goals reviewed with patient? Yes  SHORT TERM GOALS: Target date: 11-06-22 (4 weeks)  Pt will perform sit to stand transfer with 1 UE support only from standard chair and will stabilize upon initial standing without bracing with legs. Baseline:  bil. UE support needed from mat table Goal status: INITIAL  2.  Improve Berg balance test score to >/= 31/56 to demo improved standing balance. Baseline: 26/56 Goal status: INITIAL  3.  Pt will amb. 115' with hemiwalker with CGA on flat, even surface. Baseline: gait with hemiwalker to be assessed Goal status: INITIAL  4.  Increase gait velocity to >/= 2.4 ft/sec with RW for increased gait efficiency. Baseline: 15.94 secs = 2.06 ft/sec Goal status: INITIAL  5.  Improve TUG score to </= 17 secs with RW with pt demonstrating correct hand placement with sit to/from stand transfers and safe speed, I.e. not rushing. Baseline: 18.97 secs with RW but not safe Goal status: INITIAL  6.  Independent with HEP for balance and strengthening exercises. Baseline:  Goal status: INITIAL  LONG TERM GOALS: Target date: 12-04-22 (8 weeks)  Pt will perform sit to stand transfer without UE support from standard chair and will stabilize upon initial standing without bracing with legs. Baseline:  bil. UE support needed from mat table Goal status: INITIAL   2.  Pt will amb. 350' with quad cane with supervision  on flat, even surface for increased community accessibility. Baseline: pt using RW- modified independent household, SBA community amb. Goal status: INITIAL  3.  Improve Berg balance test score to >/= 36/56 to demo improved standing balance. Baseline: 26/56 Goal status: INITIAL  4.   Increase gait velocity to >/= 3.0 ft/sec with RW for increased gait efficiency. Baseline: 15.94 secs = 2.06 ft/sec Goal status: INITIAL  5.  Improve TUG score to </= 15 secs with hemiwalker (or quad cane) with pt demonstrating correct hand placement with sit to/from stand transfers and safe speed, I.e. not rushing. Baseline: 18.97 secs with RW but not safe Goal status: INITIAL  6.  Transfer floor to stand with UE support on sturdy object or furniture with supervision. Baseline: TBA Goal status: INITIAL  ASSESSMENT:  CLINICAL IMPRESSION: PT session focused on LLE strengthening including Lt hamstring strengthening and Lt knee extension control exercises to reduce Lt knee hyperextension in stance.  Session also focused on gait training with [facility] and trial of SPC with rubber quad  base.  SPC was trialed at end of session when pt was fatigued and pt able to amb. Only approx. 63' with need for seated rest period due to LE weakness and feeling that "my legs are giving out".  SBQC safer and more appropriate assistive device for pt at this time due to need for increased stability due to LLE weakness.  Pt is progressing well towards goals.  Continue POC.   OBJECTIVE IMPAIRMENTS: Abnormal gait, decreased activity tolerance, decreased balance, decreased coordination, decreased knowledge of use of DME, decreased strength, decreased safety awareness, and impaired sensation.   ACTIVITY LIMITATIONS: carrying, lifting, bending, standing, squatting, stairs, transfers, locomotion level, and driving  PARTICIPATION LIMITATIONS: meal prep, cleaning, laundry, driving, shopping, community activity, and yard work  PERSONAL FACTORS:  Past/current experiences and 1 comorbidity: h/o cervical tumors with s/p cervical fusion  are also affecting patient's functional outcome.   REHAB POTENTIAL: Good  CLINICAL DECISION MAKING: Evolving/moderate complexity  EVALUATION COMPLEXITY: Moderate  PLAN:  PT FREQUENCY:  3x/week for 4 wks followed by 2x/wk for 4 wks  PT DURATION: 8 weeks  PLANNED INTERVENTIONS: Therapeutic exercises, Therapeutic activity, Neuromuscular re-education, Balance training, Gait training, Patient/Family education, Self Care, Stair training, Orthotic/Fit training, DME instructions, and Aquatic Therapy  PLAN FOR NEXT SESSION:  please check to see if pt has any ?'s or problems with the 2 exercises I added - work on gait training with SBQC:  any LLE strengthening exercises - closed chain, tall kneeling, etc.:  standing balance exercises/activities  Jennie Hannay, Donavan Burnet, PT 10/15/2022, 7:00 PM

## 2022-10-16 ENCOUNTER — Ambulatory Visit: Payer: Managed Care, Other (non HMO) | Admitting: Physical Therapy

## 2022-10-16 DIAGNOSIS — M6281 Muscle weakness (generalized): Secondary | ICD-10-CM

## 2022-10-16 DIAGNOSIS — R2689 Other abnormalities of gait and mobility: Secondary | ICD-10-CM | POA: Diagnosis not present

## 2022-10-16 DIAGNOSIS — R293 Abnormal posture: Secondary | ICD-10-CM

## 2022-10-16 DIAGNOSIS — R2681 Unsteadiness on feet: Secondary | ICD-10-CM

## 2022-10-16 NOTE — Therapy (Signed)
OUTPATIENT PHYSICAL THERAPY NEURO TREATMENT   Patient Name: Larry Riley MRN: [id number] DOB:[birth year], 42 y.o., male Today's Date: 10/16/2022   PCP: Corwin Levins., MD REFERRING PROVIDER: Juliann Pares, FNP  END OF SESSION:  PT End of Session - 10/16/22 1448     Visit Number 4    Number of Visits 21    Date for PT Re-Evaluation 12/04/22    Authorization Type [employer]    Authorization - Number of Visits 60    PT Start Time 1445    PT Stop Time 1530    PT Time Calculation (min) 45 min    Equipment Utilized During Treatment Gait belt;Other (comment)   SBQC, SPC with rubber quad base   Activity Tolerance Patient tolerated treatment well    Behavior During Therapy WFL for tasks assessed/performed               Past Medical History:  Diagnosis Date   Anxiety    Depression    GERD (gastroesophageal reflux disease) 01/21/2017   MRSA (methicillin resistant Staphylococcus aureus) 2005   leg   Neurofibromatosis    tumor down spine and brain   Past Surgical History:  Procedure Laterality Date   hydrocephalus     shunt 1999-[place]   NECK SURGERY      tumor removal   SPINE SURGERY  10/2013   c2-t2 fusion  , laminectomy c1-6-- Dr Raynald Kemp-- [place]   VASECTOMY  02/2013   Patient Active Problem List   Diagnosis Date Noted   Peripheral edema 08/17/2022   Hypotension 08/17/2022   Urinary incontinence 07/04/2022   Neurofibroma 04/23/2022   Neoplasm of unspecified behavior of bone, soft tissue, and skin 02/16/2022   Acute hearing loss, right 08/04/2021   Hyperglycemia 08/04/2021   Abscess of right foot 08/16/2020   Cellulitis of right foot 08/16/2020   Microhematuria 08/03/2020   Gastroesophageal reflux disease 07/07/2019   External otitis of right ear 03/09/2017   Thumb laceration, right, initial encounter 03/09/2017   Weakness of left side of body 11/19/2016   Stress due to illness of family member 06/10/2016   Syncope 04/23/2016   Onychomycosis 04/23/2016    Impacted ear wax 04/23/2016   Constipation due to opioid therapy 06/13/2014   Chronic pain syndrome 02/21/2014   Allergic reaction 02/20/2014   Neurofibromatosis, type 1 (HCC) 12/14/2013   Status post cervical arthrodesis 11/21/2013   Cervical myelopathy (HCC) 09/21/2013   Encounter for well adult exam with abnormal findings 06/23/2010   Disorder of autonomic nervous system 04/12/2009   Other psoriasis 08/24/2007   Tinea pedis 08/24/2007    ONSET DATE: 04-23-22  REFERRING DIAG:  Diagnosis  D49.2 (ICD-10-CM) - Neoplasm of unspecified behavior of bone, soft tissue, and skin    THERAPY DIAG:  Other abnormalities of gait and mobility  Muscle weakness (generalized)  Unsteadiness on feet  Abnormal posture  Rationale for Evaluation and Treatment: Rehabilitation  SUBJECTIVE STATEMENT:  "Larry Riley"  Pt denies any falls since last session. Pt reports ongoing chronic pain in shoulders and lower back.  Pt accompanied by:  spouse, Larry Riley  PERTINENT HISTORY: s/p T11-L1 Laminectomies for intradural tumor resections & L3-L5 laminectomies for intradural tumor resections (hospitalization 04-23-22 - 04-29-22;  Neurofibromatosis 1 diagnosed in 1995  History of foot surgery 08/2020  Patient had metal removed from R foot and infected tissue removed  Neurofibromatosis (CMS/HCC)  Neurofibromatosis, type 1 (von Recklinghausen's disease) (CMS/HCC)  Status post cervical arthrodesis 11/21/2013 - cervical fusion C2-T2, laminectomy C1- C6  PAIN:  Are you having pain? Yes: NPRS scale: 3/10 Pain location: low back; tense in shoulders Aggravating factors: sitting in recliner chair Relieving factors: Gabapentin Pain was 7-8/10 prior to surgery (on average day) - pt reports pain is much improved since surgery  PRECAUTIONS:  Fall  RED FLAGS: Bowel or bladder incontinence: Yes: Improving - pt states he has mostly bladder incontinence at night; little control over starting/stopping  WEIGHT BEARING RESTRICTIONS: No  FALLS: Has patient fallen in last 6 months? Yes. Number of falls 2  LIVING ENVIRONMENT: Lives with: lives with their spouse Lives in: House/apartment Stairs: Yes: External: 2 steps; can reach both Has following equipment at home: Dan Humphreys - 2 wheeled, Wheelchair (manual), Tour manager, and hemiwalker  PLOF: Independent with basic ADLs, Independent with household mobility without device, Independent with community mobility without device, and Independent with transfers; pt drove prior to surgery on 04-23-22 (has not driven since)  PATIENT GOALS: "walk without use of device, as I was prior to surgery"; work on balance  OBJECTIVE:   DIAGNOSTIC FINDINGS: N/A  COGNITION: Overall cognitive status: Within functional limits for tasks assessed   SENSATION: Impaired in bil. LE's   COORDINATION: Decreased in bil. LE's wi  POSTURE: forward head, decreased thoracic kyphosis, and head is laterally flexed to Lt side  LOWER EXTREMITY ROM:   WFL's except for Lt ankle ROM - pt has worn AFO for > 4 yrs Decreased Rt active dorsiflexion and plantarflexion due to weakness  LOWER EXTREMITY MMT:    MMT Right Eval Left Eval  Hip flexion 5 5  Hip extension    Hip abduction    Hip adduction    Hip internal rotation    Hip external rotation    Knee flexion 4+ (seated position) 4 (seated position)  Knee extension 5 5  Ankle dorsiflexion 3- 0-1  Ankle plantarflexion    Ankle inversion    Ankle eversion    (Blank rows = not tested)  BED MOBILITY:  Modified independent   TRANSFERS: Assistive device utilized: Environmental consultant - 2 wheeled  Sit to stand: Modified independence short distances; SBA community amb. With RW Stand to sit: Modified independence  CURB: TBA  STAIRS:  TBA  GAIT: Gait pattern: step  through pattern, decreased ankle dorsiflexion- Right, decreased ankle dorsiflexion- Left, genu recurvatum- Left, and trunk rotated posterior- Left Distance walked: 74' Assistive device utilized: Environmental consultant - 2 wheeled Level of assistance: SBA Comments: AFO worn on LLE - carbon fiber with an anterior guard added (appears to be custom fabricated - pt states he obtained this brace approx. 4 yrs ago)  FUNCTIONAL TESTS:  Timed up and go (TUG): 18.97 secs with RW but pt was not safe - pt was instructed to ambulate at his normal speed but ambulated at fast pace and was unsteady with turns so score is not totally accurate - CGA was provided for safety 10 meter walk test: 15.94 secs  with RW = 2.06 ft/sec Berg Balance Scale: 26/56 3-4   PATIENT SURVEYS:  N/A for his diagnosis - s/p laminectomies for tumor resections  TODAY'S TREATMENT:   NMR Sit to stands from elevated mat table x 10 reps Pt initially exhibits some compensations by hyperextending B knees and bracing against mat table and genu valgum Added yoga block between knees and RW in front of patient for one UE support with transfer with decreased compensations noted 2 x 10 reps Standing mini-squats 2 x 10 reps with 8" foam pad behind patient as target and use of RW for BUE support  Lateral sidestepping in // bars 6 x 10 ft L/R with progression from BUE support to just B fingertip support Pt needs cues to "lead with heel" for hip abd activation Decreased LE control noted with onset of fatigue  Gait Gait pattern: decreased hip/knee flexion- Left and ataxic Distance walked: 230 ft Assistive device utilized:  SBQC with 4 rubber prongs Level of assistance: Min A Comments: trial with this device at beginning of session, does have onset of fatigue by end of 2nd lap with decreased BLE control and some ataxia of movement  Gait pattern: decreased hip/knee flexion- Left and ataxic Distance walked: 50 ft Assistive device utilized:  hurrycane Level  of assistance: Min A Comments: very unsteady, path deviation, ataxia; unsafe to continue so terminated trial after 50 ft  Gait pattern: decreased hip/knee flexion- Left and ataxic Distance walked: 175 Assistive device utilized: Quad cane small base Level of assistance: Min A Comments: onset of decreased stability due to onset of low back spasms/weakness but is safer with device when he is less fatigued; encouraged pt and his wife to go ahead and purchase device but that he should use device with physical assist at home and be mindful of fatigue levels    PATIENT EDUCATION: Education details: continue HEP, see above regarding SBQC Person educated: Patient and Spouse Education method: Explanation Education comprehension: verbalized understanding  HOME EXERCISE PROGRAM:  Access Code: [email or web address] URL: [email or web address] Date: 10/13/2022 Prepared by: Maryruth Eve  Exercises - Sit to Stand  - 1 x daily - 7 x weekly - 2-3 sets - 10 reps - Sit to Stand with Resistance Around Legs  - 1 x daily - 7 x weekly - 2-3 sets - 5 reps - Standing March with Unilateral Counter Support  - 1 x daily - 7 x weekly - 3 sets - 20 reps - Standing 3-Way Leg Reach with Resistance at Ankles and Counter Support  - 1 x daily - 7 x weekly - 2-3 sets - 10 reps  Walking with hemiwalker with gait belt and spouse supervision ~1x day  [employer] [email or web address]  GOALS: Goals reviewed with patient? Yes  SHORT TERM GOALS: Target date: 11-06-22 (4 weeks)  Pt will perform sit to stand transfer with 1 UE support only from standard chair and will stabilize upon initial standing without bracing with legs. Baseline:  bil. UE support needed from mat table Goal status: INITIAL  2.  Improve Berg balance test score to >/= 31/56 to demo improved standing balance. Baseline: 26/56 Goal status: INITIAL  3.  Pt will amb. 115' with hemiwalker with CGA on flat, even surface. Baseline: gait with hemiwalker to be  assessed Goal status: INITIAL  4.  Increase gait velocity to >/= 2.4 ft/sec with RW for increased gait efficiency. Baseline: 15.94 secs = 2.06 ft/sec Goal status: INITIAL  5.  Improve TUG score to </= 17 secs with  RW with pt demonstrating correct hand placement with sit to/from stand transfers and safe speed, I.e. not rushing. Baseline: 18.97 secs with RW but not safe Goal status: INITIAL  6.  Independent with HEP for balance and strengthening exercises. Baseline:  Goal status: INITIAL  LONG TERM GOALS: Target date: 12-04-22 (8 weeks)  Pt will perform sit to stand transfer without UE support from standard chair and will stabilize upon initial standing without bracing with legs. Baseline:  bil. UE support needed from mat table Goal status: INITIAL   2.  Pt will amb. 350' with quad cane with supervision on flat, even surface for increased community accessibility. Baseline: pt using RW- modified independent household, SBA community amb. Goal status: INITIAL  3.  Improve Berg balance test score to >/= 36/56 to demo improved standing balance. Baseline: 26/56 Goal status: INITIAL  4.   Increase gait velocity to >/= 3.0 ft/sec with RW for increased gait efficiency. Baseline: 15.94 secs = 2.06 ft/sec Goal status: INITIAL  5.  Improve TUG score to </= 15 secs with hemiwalker (or quad cane) with pt demonstrating correct hand placement with sit to/from stand transfers and safe speed, I.e. not rushing. Baseline: 18.97 secs with RW but not safe Goal status: INITIAL  6.  Transfer floor to stand with UE support on sturdy object or furniture with supervision. Baseline: TBA Goal status: INITIAL  ASSESSMENT:  CLINICAL IMPRESSION: Emphasis of skilled PT session on continuing to trial gait with various devices to determine safest LRAD at this time. Pt exhibits safest and most controlled gait with use of SBQC though he does continue to exhibit gait impairments as noted above with decrease in  balance with onset of fatigue. Pt also continues to exhibit ongoing core and LB weakness leading to decreased safety and balance with functional mobility. Pt continues to benefit from skilled therapy services to work towards LTGs. Continue POC.   OBJECTIVE IMPAIRMENTS: Abnormal gait, decreased activity tolerance, decreased balance, decreased coordination, decreased knowledge of use of DME, decreased strength, decreased safety awareness, and impaired sensation.   ACTIVITY LIMITATIONS: carrying, lifting, bending, standing, squatting, stairs, transfers, locomotion level, and driving  PARTICIPATION LIMITATIONS: meal prep, cleaning, laundry, driving, shopping, community activity, and yard work  PERSONAL FACTORS: Past/current experiences and 1 comorbidity: h/o cervical tumors with s/p cervical fusion  are also affecting patient's functional outcome.   REHAB POTENTIAL: Good  CLINICAL DECISION MAKING: Evolving/moderate complexity  EVALUATION COMPLEXITY: Moderate  PLAN:  PT FREQUENCY:  3x/week for 4 wks followed by 2x/wk for 4 wks  PT DURATION: 8 weeks  PLANNED INTERVENTIONS: Therapeutic exercises, Therapeutic activity, Neuromuscular re-education, Balance training, Gait training, Patient/Family education, Self Care, Stair training, Orthotic/Fit training, DME instructions, and Aquatic Therapy  PLAN FOR NEXT SESSION:  how is HEP? - work on Investment banker, operational with SBQC:  any LLE strengthening exercises - closed chain, tall kneeling, etc.:  standing balance exercises/activities; functional strengthening (sit to stands, squats)   Peter Congo, PT, DPT, CSRS  10/16/2022, 3:31 PM

## 2022-10-18 ENCOUNTER — Encounter: Payer: Self-pay | Admitting: Internal Medicine

## 2022-10-18 DIAGNOSIS — R300 Dysuria: Secondary | ICD-10-CM

## 2022-10-19 ENCOUNTER — Ambulatory Visit: Payer: Managed Care, Other (non HMO) | Admitting: Physical Therapy

## 2022-10-19 DIAGNOSIS — R278 Other lack of coordination: Secondary | ICD-10-CM

## 2022-10-19 DIAGNOSIS — R2681 Unsteadiness on feet: Secondary | ICD-10-CM

## 2022-10-19 DIAGNOSIS — M6281 Muscle weakness (generalized): Secondary | ICD-10-CM

## 2022-10-19 DIAGNOSIS — R293 Abnormal posture: Secondary | ICD-10-CM

## 2022-10-19 DIAGNOSIS — R2689 Other abnormalities of gait and mobility: Secondary | ICD-10-CM

## 2022-10-19 MED ORDER — TIZANIDINE HCL 4 MG PO TABS: ORAL_TABLET | ORAL | 3 refills | Status: DC

## 2022-10-19 NOTE — Therapy (Signed)
OUTPATIENT PHYSICAL THERAPY NEURO TREATMENT   Patient Name: Larry Riley MRN: [id number] DOB:1980/10/07, 42 y.o., male Today's Date: 10/19/2022   PCP: Corwin Levins., MD REFERRING PROVIDER: Juliann Pares, FNP  END OF SESSION:  PT End of Session - 10/19/22 1448     Visit Number 5    Number of Visits 21    Date for PT Re-Evaluation 12/04/22    Authorization Type [employer]    Authorization - Number of Visits 60    PT Start Time 1449    PT Stop Time 1531    PT Time Calculation (min) 42 min    Equipment Utilized During Treatment Gait belt;Other (comment)   SBQC, SPC with rubber quad base   Activity Tolerance Patient tolerated treatment well;Patient limited by fatigue    Behavior During Therapy [facility] for tasks assessed/performed                Past Medical History:  Diagnosis Date   Anxiety    Depression    GERD (gastroesophageal reflux disease) 01/21/2017   MRSA (methicillin resistant Staphylococcus aureus) 2005   leg   Neurofibromatosis    tumor down spine and brain   Past Surgical History:  Procedure Laterality Date   hydrocephalus     [place]unt 1999[place]   NECK SURGERY      tumor removal   SPINE SURGERY  10/2013   c2-t2 fusion  , laminectomy c1-6-- Dr Raynald Kemp-- [place]   VASECTOMY  02/2013   Patient Active Problem List   Diagnosis Date Noted   Peripheral edema 08/17/2022   Hypotension 08/17/2022   Urinary incontinence 07/04/2022   Neurofibroma 04/23/2022   Neoplasm of unspecified behavior of bone, soft tissue, and skin 02/16/2022   Acute hearing loss, right 08/04/2021   Hyperglycemia 08/04/2021   Abscess of right foot 08/16/2020   Cellulitis of right foot 08/16/2020   Microhematuria 08/03/2020   Gastroesophageal reflux disease 07/07/2019   External otitis of right ear 03/09/2017   Thumb laceration, right, initial encounter 03/09/2017   Weakness of left side of body 11/19/2016   Stress due to illness of family member 06/10/2016   Syncope 04/23/2016    Onychomycosis 04/23/2016   Impacted ear wax 04/23/2016   Constipation due to opioid therapy 06/13/2014   Chronic pain syndrome 02/21/2014   Allergic reaction 02/20/2014   Neurofibromatosis, type 1 (HCC) 12/14/2013   Status post cervical arthrodesis 11/21/2013   Cervical myelopathy (HCC) 09/21/2013   Encounter for well adult exam with abnormal findings 06/23/2010   Disorder of autonomic nervous system 04/12/2009   Other psoriasis 08/24/2007   Tinea pedis 08/24/2007    ONSET DATE: 04-23-22  REFERRING DIAG:  Diagnosis  D49.2 (ICD-10-CM) - Neoplasm of unspecified behavior of bone, soft tissue, and skin    THERAPY DIAG:  Other abnormalities of gait and mobility  Muscle weakness (generalized)  Unsteadiness on feet  Abnormal posture  Other lack of coordination  Rationale for Evaluation and Treatment: Rehabilitation  SUBJECTIVE STATEMENT:  "Larry Riley"  Pt denies any falls since last session. Did not have much energy this weekend. For HEP, practiced sit to stands and a little bit of marching.   Sees Doctor on Wednesday afternoon after PT visit to discuss antibiotics.  Pt accompanied by:  spouse, [name]  PERTINENT HISTORY: s/p T11-L1 Laminectomies for intradural tumor resections & L3-L5 laminectomies for intradural tumor resections (hospitalization 04-23-22 - 04-29-22;  Neurofibromatosis 1 diagnosed in 1995  History of foot surgery 08/2020  Patient had metal removed from R foot and infected tissue removed  Neurofibromatosis (CMS/HCC)  Neurofibromatosis, type 1 (von Recklinghausen's disease) (CMS/HCC)  Status post cervical arthrodesis 11/21/2013 - cervical fusion C2-T2, laminectomy C1- C6  PAIN:  Are you having pain? "Little bit, took pain meds later today"   PRECAUTIONS: Fall  RED FLAGS: Bowel  or bladder incontinence: Yes: Improving - pt states he has mostly bladder incontinence at night; little control over starting/stopping  WEIGHT BEARING RESTRICTIONS: No  FALLS: Has patient fallen in last 6 months? Yes. Number of falls 2  LIVING ENVIRONMENT: Lives with: lives with their spouse Lives in: House/apartment Stairs: Yes: External: 2 steps; can reach both Has following equipment at home: Dan Humphreys - 2 wheeled, Wheelchair (manual), Tour manager, and hemiwalker  PLOF: Independent with basic ADLs, Independent with household mobility without device, Independent with community mobility without device, and Independent with transfers; pt drove prior to surgery on 04-23-22 (has not driven since)  PATIENT GOALS: "walk without use of device, as I was prior to surgery"; work on balance  OBJECTIVE:   DIAGNOSTIC FINDINGS: N/A  COGNITION: Overall cognitive status: Within functional limits for tasks assessed   SENSATION: Impaired in bil. LE's   COORDINATION: Decreased in bil. LE's wi  POSTURE: forward head, decreased thoracic kyphosis, and head is laterally flexed to Lt side  LOWER EXTREMITY ROM:   WFL's except for Lt ankle ROM - pt has worn AFO for > 4 yrs Decreased Rt active dorsiflexion and plantarflexion due to weakness  LOWER EXTREMITY MMT:    MMT Right Eval Left Eval  Hip flexion 5 5  Hip extension    Hip abduction    Hip adduction    Hip internal rotation    Hip external rotation    Knee flexion 4+ (seated position) 4 (seated position)  Knee extension 5 5  Ankle dorsiflexion 3- 0-1  Ankle plantarflexion    Ankle inversion    Ankle eversion    (Blank rows = not tested)  BED MOBILITY:  Modified independent   TRANSFERS: Assistive device utilized: Environmental consultant - 2 wheeled  Sit to stand: Modified independence short distances; SBA community amb. With RW Stand to sit: Modified independence  CURB: TBA  STAIRS:  TBA  GAIT: Gait pattern: step through pattern, decreased  ankle dorsiflexion- Right, decreased ankle dorsiflexion- Left, genu recurvatum- Left, and trunk rotated posterior- Left Distance walked: 49' Assistive device utilized: Environmental consultant - 2 wheeled Level of assistance: SBA Comments: AFO worn on LLE - carbon fiber with an anterior guard added (appears to be custom fabricated - pt states he obtained this brace approx. 4 yrs ago)  FUNCTIONAL TESTS:  Timed up and go (TUG): 18.97 secs with RW but pt was not safe - pt was instructed to ambulate at his normal speed but ambulated at fast pace and was unsteady with turns so score is not totally accurate - CGA was provided for safety 10 meter walk test: 15.94 secs with RW = 2.06 ft/sec Berg Balance Scale:  26/56 3-4   PATIENT SURVEYS:  N/A for his diagnosis - s/p laminectomies for tumor resections  TODAY'S TREATMENT:  NMR Gait With quad cane ambulated 230 ft until pt felt knee hyperextend: no LOB, CGA progressing to min A to recover balance after hyperextension  Patient notes trying to work on looking straight ahead instead of at feet Patient demonstrates ataxic gait with decreased hip and knee flexion on the left  Tall kneeling hip hinge x8 Cued to hinge only to the point of a tolerable stretch in the quads Patient reports pain in left knee  Tall kneeling push-pull activity w/ 2# weighted bar 2 sets x 4 reps with 4 second isometric holds Added airex pad under knees to increase comfort in tall kneeling position Patient with poor hip extensor control- compensates with weight shift to right, brace w/ R foot, posterior lean  TherAct Sidelying Clamshells Bilaterally 2 sets x 10 repetitions Verbal and tactile cues to use heel of hand to palpate glute med contraction and keep hip forwards Pt reports as medium-hard difficulty on R L hip unable to lift off-- regressed to supine hip abduction x20, pt reports as difficult Added to HEP, see bolded below  MMT L Hip Abduction  2-/5  In quadruped, attempted  alternating arm lift offs, pt did not tolerate position after ~2 lifts bilaterally 2/2 fatigue   PATIENT EDUCATION: Education details: continue HEP, see above regarding SBQC Person educated: Patient and Spouse Education method: Explanation Education comprehension: verbalized understanding  HOME EXERCISE PROGRAM:  Access Code: [email or web address] URL: [email or web address] Date: 10/13/2022 Prepared by: Maryruth Eve  Exercises - Sit to Stand  - 1 x daily - 7 x weekly - 2-3 sets - 10 reps - Sit to Stand with Resistance Around Legs  - 1 x daily - 7 x weekly - 2-3 sets - 5 reps - Standing March with Unilateral Counter Support  - 1 x daily - 7 x weekly - 3 sets - 20 reps - Standing 3-Way Leg Reach with Resistance at Ankles and Counter Support  - 1 x daily - 7 x weekly - 2-3 sets - 10 reps - Clamshell  - 7 x weekly - 1 x daily - 3 sets - 10 reps - Supine Hip Abduction  - 7 x weekly - 1 x daily - 3 sets - 10 reps  Walking with hemiwalker with gait belt and spouse supervision ~1x day  Medbridge [email or web address]  GOALS: Goals reviewed with patient? Yes  SHORT TERM GOALS: Target date: 11-06-22 (4 weeks)  Pt will perform sit to stand transfer with 1 UE support only from standard chair and will stabilize upon initial standing without bracing with legs. Baseline:  bil. UE support needed from mat table Goal status: INITIAL  2.  Improve Berg balance test score to >/= 31/56 to demo improved standing balance. Baseline: 26/56 Goal status: INITIAL  3.  Pt will amb. 115' with hemiwalker with CGA on flat, even surface. Baseline: gait with hemiwalker to be assessed Goal status: INITIAL  4.  Increase gait velocity to >/= 2.4 ft/sec with RW for increased gait efficiency. Baseline: 15.94 secs = 2.06 ft/sec Goal status: INITIAL  5.  Improve TUG score to </= 17 secs with RW with pt demonstrating correct hand placement with sit to/from stand transfers and safe speed, I.e. not rushing. Baseline:  18.97 secs with RW but not safe Goal status: INITIAL  6.  Independent with HEP for balance and strengthening exercises. Baseline:  Goal status: INITIAL  LONG TERM GOALS: Target date: 12-04-22 (8 weeks)  Pt will perform sit to stand transfer without UE support from standard chair and will stabilize upon initial standing without bracing with legs. Baseline:  bil. UE support needed from mat table Goal status: INITIAL   2.  Pt will amb. 350' with quad cane with supervision on flat, even surface for increased community accessibility. Baseline: pt using RW- modified independent household, SBA community amb. Goal status: INITIAL  3.  Improve Berg balance test score to >/= 36/56 to demo improved standing balance. Baseline: 26/56 Goal status: INITIAL  4.   Increase gait velocity to >/= 3.0 ft/sec with RW for increased gait efficiency. Baseline: 15.94 secs = 2.06 ft/sec Goal status: INITIAL  5.  Improve TUG score to </= 15 secs with hemiwalker (or quad cane) with pt demonstrating correct hand placement with sit to/from stand transfers and safe speed, I.e. not rushing. Baseline: 18.97 secs with RW but not safe Goal status: INITIAL  6.  Transfer floor to stand with UE support on sturdy object or furniture with supervision. Baseline: TBA Goal status: INITIAL  ASSESSMENT:  CLINICAL IMPRESSION: Emphasis of skilled PT session on ambulation endurance with quad cane, core and LE strengthening. Pt demonstrates significantly weakened hip abductors, L>R at a 2-/5 MMT. Pt continues to benefit from skilled therapy services to work towards LTGs. Continue POC.   OBJECTIVE IMPAIRMENTS: Abnormal gait, decreased activity tolerance, decreased balance, decreased coordination, decreased knowledge of use of DME, decreased strength, decreased safety awareness, and impaired sensation.   ACTIVITY LIMITATIONS: carrying, lifting, bending, standing, squatting, stairs, transfers, locomotion level, and  driving  PARTICIPATION LIMITATIONS: meal prep, cleaning, laundry, driving, shopping, community activity, and yard work  PERSONAL FACTORS: Past/current experiences and 1 comorbidity: h/o cervical tumors with s/p cervical fusion  are also affecting patient's functional outcome.   REHAB POTENTIAL: Good  CLINICAL DECISION MAKING: Evolving/moderate complexity  EVALUATION COMPLEXITY: Moderate  PLAN:  PT FREQUENCY:  3x/week for 4 wks followed by 2x/wk for 4 wks  PT DURATION: 8 weeks  PLANNED INTERVENTIONS: Therapeutic exercises, Therapeutic activity, Neuromuscular re-education, Balance training, Gait training, Patient/Family education, Self Care, Stair training, Orthotic/Fit training, DME instructions, and Aquatic Therapy  PLAN FOR NEXT SESSION:  how is HEP? - work on Investment banker, operational with SBQC:  any LLE strengthening exercises - closed chain, tall kneeling, etc.:  standing balance exercises/activities; functional strengthening (sit to stands, squats), bioness (pt asks for "not too much", would need to bring shoes w/o brace), hip abduction strengthening, core, quadruped  Beverely Low, SPT  Peter Congo, PT, DPT, CSRS  10/19/2022, 3:58 PM

## 2022-10-21 ENCOUNTER — Ambulatory Visit (INDEPENDENT_AMBULATORY_CARE_PROVIDER_SITE_OTHER): Payer: Managed Care, Other (non HMO) | Admitting: Internal Medicine

## 2022-10-21 ENCOUNTER — Ambulatory Visit: Payer: Managed Care, Other (non HMO) | Admitting: Physical Therapy

## 2022-10-21 ENCOUNTER — Ambulatory Visit (INDEPENDENT_AMBULATORY_CARE_PROVIDER_SITE_OTHER): Payer: 59 | Admitting: Behavioral Health

## 2022-10-21 ENCOUNTER — Encounter: Payer: Self-pay | Admitting: Behavioral Health

## 2022-10-21 ENCOUNTER — Encounter: Payer: Self-pay | Admitting: Internal Medicine

## 2022-10-21 VITALS — BP 96/83 | HR 93 | Temp 97.1°F | Ht 64.0 in | Wt 200.0 lb

## 2022-10-21 DIAGNOSIS — L989 Disorder of the skin and subcutaneous tissue, unspecified: Secondary | ICD-10-CM

## 2022-10-21 DIAGNOSIS — F411 Generalized anxiety disorder: Secondary | ICD-10-CM

## 2022-10-21 DIAGNOSIS — R300 Dysuria: Secondary | ICD-10-CM

## 2022-10-21 DIAGNOSIS — R350 Frequency of micturition: Secondary | ICD-10-CM | POA: Insufficient documentation

## 2022-10-21 DIAGNOSIS — R739 Hyperglycemia, unspecified: Secondary | ICD-10-CM

## 2022-10-21 DIAGNOSIS — K219 Gastro-esophageal reflux disease without esophagitis: Secondary | ICD-10-CM | POA: Diagnosis not present

## 2022-10-21 LAB — POCT URINALYSIS DIPSTICK
Bilirubin, UA: NEGATIVE
Glucose, UA: NEGATIVE
Ketones, UA: NEGATIVE
Leukocytes, UA: NEGATIVE
Nitrite, UA: NEGATIVE
Protein, UA: NEGATIVE
Spec Grav, UA: 1.015 (ref 1.010–1.025)
Urobilinogen, UA: NEGATIVE U/dL — AB
pH, UA: 6 (ref 5.0–8.0)

## 2022-10-21 MED ORDER — AMOXICILLIN-POT CLAVULANATE 875-125 MG PO TABS: 1.0000 | ORAL_TABLET | Freq: Two times a day (BID) | ORAL | 0 refills | Status: DC

## 2022-10-21 NOTE — Progress Notes (Signed)
Patient ID: Larry Riley, male   DOB: 26-Feb-1980, 42 y.o.   MRN: [id number]        Chief Complaint: follow up dysuria with frequency, skin lesion.        HPI:  Larry Riley is a 42 y.o. male here with c/o 2-3 days onset dysuria with frequency, left flank pain, urgency and feels ill, feverish similar to prior hx of UTI.  Denies urinary symptoms such as  hematuria or n/v, chills. .Pt denies chest pain, increased sob or doe, wheezing, orthopnea, PND, increased LE swelling, palpitations, dizziness or syncope.   Pt denies polydipsia, polyuria, or new focal neuro s/s.  Also has several skin lesion to arms and torso, asks for derm referral.  Has had mild worsening reflux, but no other abd pain, dysphagia, n/v, bowel change or blood.       Wt Readings from Last 3 Encounters:  10/21/22 200 lb (90.7 kg)  07/02/22 185 lb (83.9 kg)  02/04/22 200 lb 2 oz (90.8 kg)   BP Readings from Last 3 Encounters:  10/22/22 118/79  10/21/22 96/83  10/13/22 115/85         Past Medical History:  Diagnosis Date   Anxiety    Depression    GERD (gastroesophageal reflux disease) 01/21/2017   MRSA (methicillin resistant Staphylococcus aureus) 2005   leg   Neurofibromatosis    tumor down spine and brain   Past Surgical History:  Procedure Laterality Date   hydrocephalus     [place]unt 1999[place]   NECK SURGERY      tumor removal   SPINE SURGERY  10/2013   c2-t2 fusion  , laminectomy c1-6-- Dr Raynald Kemp-- [place]   VASECTOMY  02/2013    reports that he has never smoked. He has never used smokeless tobacco. He reports current alcohol use of about 3.0 standard drinks of alcohol per week. He reports that he does not use drugs. family history includes Arthritis in his father, paternal grandfather, and paternal grandmother; Diabetes in his mother and paternal grandfather; Healthy in his maternal grandfather and maternal grandmother; Hyperlipidemia in his paternal grandfather; Hypertension in his father and paternal  grandfather; Prostate cancer in his father and another family member. No Known Allergies Current Outpatient Medications on File Prior to Visit  Medication Sig Dispense Refill   acetaminophen (TYLENOL) 325 MG tablet Take 650 mg by mouth every 6 (six) hours as needed for mild pain, moderate pain, fever or headache.      bisacodyl (DULCOLAX) 5 MG EC tablet Take 5 mg by mouth daily as needed for mild constipation.      diphenhydrAMINE (BENADRYL) 25 mg capsule Take 25 mg by mouth at bedtime as needed for allergies or sleep.     gabapentin (NEURONTIN) 600 MG tablet Take 1 tablet (600 mg total) by mouth 3 (three) times daily. 270 tablet 1   guaiFENesin (MUCINEX) 600 MG 12 hr tablet Take 600 mg by mouth 2 (two) times daily as needed for cough or to loosen phlegm.     LINZESS 145 MCG CAPS capsule Take 145 mcg by mouth every morning.     pantoprazole (PROTONIX) 40 MG tablet TAKE 1 TABLET(40 MG) BY MOUTH DAILY 30 tablet 0   sildenafil (REVATIO) 20 MG tablet Take 1-5 tablets by mouth daily as needed. 50 tablet 5   tamsulosin (FLOMAX) 0.4 MG CAPS capsule Take 1 capsule (0.4 mg total) by mouth daily after supper. Annual appt due in July must see provider for future refills 30 capsule  0   tiZANidine (ZANAFLEX) 4 MG tablet Take 1 tablet by mouth every 6 hours as need for muscle spasm. Annual appt is due pt must see provider for future refills 120 tablet 3   traMADol (ULTRAM) 50 MG tablet TAKE ONE TABLET BY MOUTH EVERY 8 HOURS AS NEEDED 90 tablet 2   No current facility-administered medications on file prior to visit.        ROS:  All others reviewed and negative.  Objective        PE:  BP 96/83 (BP Location: Left Arm, Patient Position: Sitting, Cuff Size: Normal)   Pulse 93   Temp (!) 97.1 F (36.2 C) (Temporal)   Ht 5\' 4"  (1.626 m)   Wt 200 lb (90.7 kg)   SpO2 94%   BMI 34.33 kg/m                 Constitutional: Pt appears in NAD               HENT: Head: NCAT.                Right Ear: External  ear normal.                 Left Ear: External ear normal.                Eyes: . Pupils are equal, round, and reactive to light. Conjunctivae and EOM are normal               Nose: without d/c or deformity               Neck: Neck supple. Gross normal ROM               Cardiovascular: Normal rate and regular rhythm.                 Pulmonary/Chest: Effort normal and breath sounds without rales or wheezing.                Abd:  Soft, NT, ND, + BS, no organomegaly               Neurological: Pt is alert. At baseline orientation, motor grossly intact               Skin: Skin is warm.  LE edema - none, has several dark and tan skin lesions to torso and arms, also several cherry angiomas               Psychiatric: Pt behavior is normal without agitation   Micro: none  Cardiac tracings I have personally interpreted today:  none  Pertinent Radiological findings (summarize): none   Lab Results  Component Value Date   WBC 7.4 08/19/2021   HGB 14.0 08/19/2021   HCT 41.6 08/19/2021   PLT 407 08/19/2021   GLUCOSE 88 08/19/2021   CHOL 152 08/19/2021   TRIG 202 (H) 08/19/2021   HDL 49 08/19/2021   LDLCALC 70 08/19/2021   ALT 36 08/19/2021   AST 28 08/19/2021   NA 139 08/19/2021   K 4.5 08/19/2021   CL 106 08/19/2021   CREATININE 0.81 08/19/2021   BUN 23 08/19/2021   CO2 21 08/19/2021   TSH 1.440 08/19/2021   HGBA1C 5.5 08/19/2021   UA dip - neg  Assessment/Plan:  Larry Riley is a 42 y.o. White or Caucasian [1] male with  has a past medical history of Anxiety, Depression, GERD (gastroesophageal reflux disease) (01/21/2017),  MRSA (methicillin resistant Staphylococcus aureus) (2005), and Neurofibromatosis.  Urinary frequency With high suspicion for recurrent uti - for ua and cx, also augmentin course bid,  to f/u any worsening symptoms or concerns  Skin lesion Also for derm referral per pt request  Hyperglycemia Lab Results  Component Value Date   HGBA1C 5.5 08/19/2021    Stable, pt to continue current medical treatment  - diet, wt control   Gastroesophageal reflux disease Mild worsening, for tums prn, declines ppi for now Followup: Return if symptoms worsen or fail to improve.  Oliver Barre, MD 10/24/2022 4:48 PM [facility]

## 2022-10-21 NOTE — Patient Instructions (Addendum)
Please have the urine testing done if possible at [facility]  Please take all new medication as prescribed - the antibiotic  Please continue all other medications as before, and refills have been done if requested.  Please have the pharmacy call with any other refills you may need.  Please keep your appointments with your specialists as you may have planned  You will be contacted regarding the referral for: Dermatology

## 2022-10-21 NOTE — Progress Notes (Signed)
[facility] Counselor/Therapist Progress Note  Patient ID: Larry Riley, MRN: [id number],    Date: 10/21/2022  Time Spent: 42 minutes from 10:05 AM until 10:47 AM This session was held via video teletherapy. The patient consented to the video teletherapy and was located in his home office during this session.  He is aware it is the responsibility of the patient to secure confidentiality on his end of the session. The provider was in a private home office for the duration of this session.      Treatment Type: Individual Therapy  Reported Symptoms: Anxiety/stress  Mental Status Exam: Appearance:  Casual     Behavior: Appropriate  Motor: Normal  Speech/Language:  Normal Rate  Affect: Appropriate  Mood: normal  Thought process: normal  Thought content:   WNL  Sensory/Perceptual disturbances:   WNL  Orientation: oriented to person, place, time/date, situation, day of week, and month of year  Attention: Good  Concentration: Good  Memory: WNL  Fund of knowledge:  Good  Insight:   Good  Judgment:  Good  Impulse Control: Good   Risk Assessment: Danger to Self:  No Self-injurious Behavior: No Danger to Others: No Duty to Warn:no Physical Aggression / Violence:No  Access to Firearms a concern: No  Gang Involvement:No   Subjective: The patient continues to progress physically.  Today was difficult because he had a root canal yesterday and just did not feel well.  He is now doing outpatient physical therapy and it is helpful but also very physically demanding and he is trying to find a balance with doing the physical therapy and working saying he is fairly exhausted by the end of the workday.  He does see the benefit though.  He is now walking with a cane for the most part is excited about that.  He is able to be up long enough to help his wife do a few things around the kitchen as long as they are at And at level and he feels good about that progress.  He and his wife  have been able to go out and eat at restaurants a couple of times and he feels secure in that so he is enjoying a couple of date nights.  Work appears to be going well.  The biggest stressor for him now is his father's health.  He had system established where he talked to his father nightly but he thinks that he is seeing some cognitive changes because his father is forgetting or not respecting boundaries and calling during the day.  He tries to quickly assess if it is something that needs to be addressed and if not put off till tonight but he is going to reach out to his father's social worker to see if he might need to get his father and other cognitive assessment seeing some decline.  I encouraged him to do so to take some of the stress off of him.  He has been doing some mindfulness work especially in relation to a store track exercise that he does online and says that is a good resetting of his brain that he can do in a short amount of time throughout the day.  He does contract for safety having no thoughts of hurting himself or anyone else. Interventions: Cognitive Behavioral Therapy  Diagnosis: Generalized anxiety disorder  Plan: I will meet with the patient every 2 to 3 weeks via care agility.  Treatment plan: We will use cognitive behavioral therapy as well as dialectical behavior  therapy to help the patient reduce anxiety/stress primarily related to medical issues.  The goal is to reduce anxiety by 50% with a target date of February 12, 2023.  Goals for reducing anxiety include helping him manage thoughts and worrisome thinking contributing to feelings of anxiety as well as recognition of those thoughts.  We will look to resolve any core conflicts outside of the medical and health issues that are contributing to anxiety including family situations and relationships.  In addition to that we will identify causes for anxiety and explore ways to lower it as well as improve his ability to manage  anxiety symptoms and better handle stress.  Interventions will be to provide education about anxiety and stress to help him identify causes and symptoms.  Introduce coping skills and problem solution skills to manage anxiety including DBT distress tolerance and mindfulness skills.  We will also use cognitive behavioral therapy to identify and change anxiety provoking thought and behavior patterns.  Larry Riley, [facility]                  Larry Riley, [facility]               Larry Riley, [facility]               Larry Riley, [facility]               Larry Riley, [facility]

## 2022-10-22 ENCOUNTER — Other Ambulatory Visit: Payer: Self-pay

## 2022-10-22 ENCOUNTER — Encounter: Payer: Self-pay | Admitting: Physical Therapy

## 2022-10-22 ENCOUNTER — Ambulatory Visit: Payer: Managed Care, Other (non HMO) | Admitting: Physical Therapy

## 2022-10-22 ENCOUNTER — Other Ambulatory Visit: Payer: Self-pay | Admitting: Internal Medicine

## 2022-10-22 VITALS — BP 118/79 | HR 80

## 2022-10-22 DIAGNOSIS — R2681 Unsteadiness on feet: Secondary | ICD-10-CM

## 2022-10-22 DIAGNOSIS — R2689 Other abnormalities of gait and mobility: Secondary | ICD-10-CM

## 2022-10-22 DIAGNOSIS — M6281 Muscle weakness (generalized): Secondary | ICD-10-CM

## 2022-10-22 NOTE — Therapy (Signed)
OUTPATIENT PHYSICAL THERAPY NEURO TREATMENT   Patient Name: Larry Riley MRN: [id number] DOB:1980-03-09, 42 y.o., male Today's Date: 10/26/2022   PCP: Corwin Levins., MD REFERRING PROVIDER: Juliann Pares, FNP  END OF SESSION:    10/22/22 1540  PT Visits / Re-Eval  Visit Number 6  Number of Visits 21  Date for PT Re-Evaluation 12/04/22  Authorization  Authorization Type [employer]  Authorization - Number of Visits 60  PT Time Calculation  PT Start Time 1538  PT Stop Time 1616  PT Time Calculation (min) 38 min  PT - End of Session  Equipment Utilized During Treatment Gait belt;Other (comment)  Activity Tolerance Patient tolerated treatment well;Patient limited by fatigue  Behavior During Therapy [facility] for tasks assessed/performed      Past Medical History:  Diagnosis Date   Anxiety    Depression    GERD (gastroesophageal reflux disease) 01/21/2017   MRSA (methicillin resistant Staphylococcus aureus) 2005   leg   Neurofibromatosis    tumor down spine and brain   Past Surgical History:  Procedure Laterality Date   hydrocephalus     shunt 1999-[place]   NECK SURGERY      tumor removal   SPINE SURGERY  10/2013   c2-t2 fusion  , laminectomy c1-6-- Dr Raynald Kemp-- [place]   VASECTOMY  02/2013   Patient Active Problem List   Diagnosis Date Noted   Skin lesion 10/24/2022   Urinary frequency 10/21/2022   Peripheral edema 08/17/2022   Hypotension 08/17/2022   Urinary incontinence 07/04/2022   Neurofibroma 04/23/2022   Neoplasm of unspecified behavior of bone, soft tissue, and skin 02/16/2022   Acute hearing loss, right 08/04/2021   Hyperglycemia 08/04/2021   Abscess of right foot 08/16/2020   Cellulitis of right foot 08/16/2020   Microhematuria 08/03/2020   Gastroesophageal reflux disease 07/07/2019   External otitis of right ear 03/09/2017   Thumb laceration, right, initial encounter 03/09/2017   Weakness of left side of body 11/19/2016   Stress due to  illness of family member 06/10/2016   Syncope 04/23/2016   Onychomycosis 04/23/2016   Impacted ear wax 04/23/2016   Constipation due to opioid therapy 06/13/2014   Chronic pain syndrome 02/21/2014   Allergic reaction 02/20/2014   Neurofibromatosis, type 1 (HCC) 12/14/2013   Status post cervical arthrodesis 11/21/2013   Cervical myelopathy (HCC) 09/21/2013   Encounter for well adult exam with abnormal findings 06/23/2010   Disorder of autonomic nervous system 04/12/2009   Other psoriasis 08/24/2007   Tinea pedis 08/24/2007    ONSET DATE: 04-23-22  REFERRING DIAG:  Diagnosis  D49.2 (ICD-10-CM) - Neoplasm of unspecified behavior of bone, soft tissue, and skin    THERAPY DIAG:  Other abnormalities of gait and mobility  Unsteadiness on feet  Muscle weakness (generalized)  Rationale for Evaluation and Treatment: Rehabilitation  SUBJECTIVE STATEMENT:  "[name]"  Patient arrives to session with 2WW. Patient reports 1 near fall when first getting up due to feeling really dizzy. Patient was blood pressure was really low which may have caused it but reports feeling a bit better today. Patient reports getting first pair of glasses ever and is getting use to them.   Pt accompanied by:  spouse, [name]  PERTINENT HISTORY: s/p T11-L1 Laminectomies for intradural tumor resections & L3-L5 laminectomies for intradural tumor resections (hospitalization 04-23-22 - 04-29-22;  Neurofibromatosis 1 diagnosed in 1995  History of foot surgery 08/2020  Patient had metal removed from R foot and infected tissue removed  Neurofibromatosis (CMS/HCC)  Neurofibromatosis, type 1 (von Recklinghausen's disease) (CMS/HCC)  Status post cervical arthrodesis 11/21/2013 - cervical fusion C2-T2, laminectomy C1- C6  PAIN:  Are you having  pain? Yes: NPRS scale: 3/10 Pain location: low back; tense in shoulders Aggravating factors: sitting in recliner chair Relieving factors: Gabapentin Pain was 7-8/10 prior to surgery (on average day) - pt reports pain is much improved since surgery  PRECAUTIONS: Fall  RED FLAGS: Bowel or bladder incontinence: Yes: Improving - pt states he has mostly bladder incontinence at night; little control over starting/stopping  WEIGHT BEARING RESTRICTIONS: No  FALLS: Has patient fallen in last 6 months? Yes. Number of falls 2  LIVING ENVIRONMENT: Lives with: lives with their spouse Lives in: House/apartment Stairs: Yes: External: 2 steps; can reach both Has following equipment at home: Dan Humphreys - 2 wheeled, Wheelchair (manual), Tour manager, and hemiwalker  PLOF: Independent with basic ADLs, Independent with household mobility without device, Independent with community mobility without device, and Independent with transfers; pt drove prior to surgery on 04-23-22 (has not driven since)  PATIENT GOALS: "walk without use of device, as I was prior to surgery"; work on balance  OBJECTIVE:   DIAGNOSTIC FINDINGS: N/A  COGNITION: Overall cognitive status: Within functional limits for tasks assessed   SENSATION: Impaired in bil. LE's   COORDINATION: Decreased in bil. LE's wi  POSTURE: forward head, decreased thoracic kyphosis, and head is laterally flexed to Lt side  LOWER EXTREMITY ROM:   WFL's except for Lt ankle ROM - pt has worn AFO for > 4 yrs Decreased Rt active dorsiflexion and plantarflexion due to weakness  LOWER EXTREMITY MMT:    MMT Right Eval Left Eval  Hip flexion 5 5  Hip extension    Hip abduction    Hip adduction    Hip internal rotation    Hip external rotation    Knee flexion 4+ (seated position) 4 (seated position)  Knee extension 5 5  Ankle dorsiflexion 3- 0-1  Ankle plantarflexion    Ankle inversion    Ankle eversion    (Blank rows = not tested)  BED  MOBILITY:  Modified independent   TRANSFERS: Assistive device utilized: Environmental consultant - 2 wheeled  Sit to stand: Modified independence short distances; SBA community amb. With RW Stand to sit: Modified independence  CURB: TBA  STAIRS:  TBA  GAIT: Gait pattern: step through pattern, decreased ankle dorsiflexion- Right, decreased ankle dorsiflexion- Left, genu recurvatum- Left, and trunk rotated posterior- Left Distance walked: 70' Assistive device utilized: Environmental consultant - 2 wheeled Level of assistance: SBA Comments: AFO worn on LLE - carbon fiber with an anterior guard added (appears to be custom fabricated - pt states he obtained this brace approx. 4 yrs ago)  FUNCTIONAL TESTS:  Timed up and go (TUG): 18.97 secs with RW but pt was not safe -  pt was instructed to ambulate at his normal speed but ambulated at fast pace and was unsteady with turns so score is not totally accurate - CGA was provided for safety 10 meter walk test: 15.94 secs with RW = 2.06 ft/sec Berg Balance Scale: 26/56 3-4   PATIENT SURVEYS:  N/A for his diagnosis - s/p laminectomies for tumor resections  TODAY'S TREATMENT:   Vitals:   10/22/22 1546  BP: 118/79  Pulse: 80   Gait  With SBQC and glasses  Gait pattern: decreased hip/knee flexion- Left and ataxic Distance walked: 230 ft Assistive device utilized:  SBQC with 4 rubber prongs Level of assistance: Min A Comments: Patient reports SBQC changes depth perception, requiring true minA intromittently due to level of instability particular on turns with change in depth perception; discussed extensive safety strategies with use of glasses at home and importance on having hands on assistance from caregiver and likely narrow hallway or along counter for safety until acclimates to new visual input  NMR:  On mat with minA for stabilty partiuclarly with fatigue Half kneel with base leg on foam on low mat with cone stacking on bench in front 2 x 8 cones Rested with UE on  bench in tall kneel  Half kneel with base leg on foam D2 cross pattern with cone stacking 2 x 6 cones Increased difficulty stabilizing with LLE in front Tall kneel hip hinge (trialed tidal tank - regressed to bar x 5 required minA due to difficulty with trunk control   PATIENT EDUCATION: Education details: continue HEP, see above regarding SBQC Person educated: Patient and Spouse Education method: Explanation Education comprehension: verbalized understanding  HOME EXERCISE PROGRAM:  Access Code: [email or web address] URL: [email or web address] Date: 10/13/2022 Prepared by: Maryruth Eve  Exercises - Sit to Stand  - 1 x daily - 7 x weekly - 2-3 sets - 10 reps - Sit to Stand with Resistance Around Legs  - 1 x daily - 7 x weekly - 2-3 sets - 5 reps - Standing March with Unilateral Counter Support  - 1 x daily - 7 x weekly - 3 sets - 20 reps - Standing 3-Way Leg Reach with Resistance at Ankles and Counter Support  - 1 x daily - 7 x weekly - 2-3 sets - 10 reps  Walking with hemiwalker with gait belt and spouse supervision ~1x day  [employer] [email or web address]  GOALS: Goals reviewed with patient? Yes  SHORT TERM GOALS: Target date: 11-06-22 (4 weeks)  Pt will perform sit to stand transfer with 1 UE support only from standard chair and will stabilize upon initial standing without bracing with legs. Baseline:  bil. UE support needed from mat table Goal status: INITIAL  2.  Improve Berg balance test score to >/= 31/56 to demo improved standing balance. Baseline: 26/56 Goal status: INITIAL  3.  Pt will amb. 115' with hemiwalker with CGA on flat, even surface. Baseline: gait with hemiwalker to be assessed Goal status: INITIAL  4.  Increase gait velocity to >/= 2.4 ft/sec with RW for increased gait efficiency. Baseline: 15.94 secs = 2.06 ft/sec Goal status: INITIAL  5.  Improve TUG score to </= 17 secs with RW with pt demonstrating correct hand placement with sit to/from stand  transfers and safe speed, I.e. not rushing. Baseline: 18.97 secs with RW but not safe Goal status: INITIAL  6.  Independent with HEP for balance and strengthening exercises. Baseline:  Goal status: INITIAL  LONG TERM GOALS: Target date: 12-04-22 (8 weeks)  Pt will perform sit to stand transfer without UE support from standard chair and will stabilize upon initial standing without bracing with legs. Baseline:  bil. UE support needed from mat table Goal status: INITIAL   2.  Pt will amb. 350' with quad cane with supervision on flat, even surface for increased community accessibility. Baseline: pt using RW- modified independent household, SBA community amb. Goal status: INITIAL  3.  Improve Berg balance test score to >/= 36/56 to demo improved standing balance. Baseline: 26/56 Goal status: INITIAL  4.   Increase gait velocity to >/= 3.0 ft/sec with RW for increased gait efficiency. Baseline: 15.94 secs = 2.06 ft/sec Goal status: INITIAL  5.  Improve TUG score to </= 15 secs with hemiwalker (or quad cane) with pt demonstrating correct hand placement with sit to/from stand transfers and safe speed, I.e. not rushing. Baseline: 18.97 secs with RW but not safe Goal status: INITIAL  6.  Transfer floor to stand with UE support on sturdy object or furniture with supervision. Baseline: TBA Goal status: INITIAL  ASSESSMENT:  CLINICAL IMPRESSION: Emphasis of skilled PT session on continuing to trial gait with [facility] with new visual input with patient having glasses for first time. Patient demonstrates notable increase in ataxic movements due to adjusting to changes in depth perception with glasses and clarity of vision requiring minA multiple times to prevent LOB. Will benefit from continued use practicing with caregiver and glasses to improve conformability with glasses. Ended session with low mat work in half kneel and tall kneel. Greatest stability challenges noted with LLE in front and with  trunk control challenges with use of weighted addition to tall kneel hip hinge. Continue to progress stance and trunk control to also help progress gait. Continue POC.  OBJECTIVE IMPAIRMENTS: Abnormal gait, decreased activity tolerance, decreased balance, decreased coordination, decreased knowledge of use of DME, decreased strength, decreased safety awareness, and impaired sensation.   ACTIVITY LIMITATIONS: carrying, lifting, bending, standing, squatting, stairs, transfers, locomotion level, and driving  PARTICIPATION LIMITATIONS: meal prep, cleaning, laundry, driving, shopping, community activity, and yard work  PERSONAL FACTORS: Past/current experiences and 1 comorbidity: h/o cervical tumors with s/p cervical fusion  are also affecting patient's functional outcome.   REHAB POTENTIAL: Good  CLINICAL DECISION MAKING: Evolving/moderate complexity  EVALUATION COMPLEXITY: Moderate  PLAN:  PT FREQUENCY:  3x/week for 4 wks followed by 2x/wk for 4 wks  PT DURATION: 8 weeks  PLANNED INTERVENTIONS: Therapeutic exercises, Therapeutic activity, Neuromuscular re-education, Balance training, Gait training, Patient/Family education, Self Care, Stair training, Orthotic/Fit training, DME instructions, and Aquatic Therapy  PLAN FOR NEXT SESSION:  how is HEP? - work on Investment banker, operational with SBQC:  any LLE strengthening exercises - closed chain, tall kneeling, etc.:  standing balance exercises/activities; functional strengthening (sit to stands, squats)  Bioness donned with balance activities and gait (recommend thigh cuff and ankle weight)    Maryruth Eve, PT, DPT 10/26/2022, 9:00 AM

## 2022-10-24 ENCOUNTER — Encounter: Payer: Self-pay | Admitting: Internal Medicine

## 2022-10-24 DIAGNOSIS — L989 Disorder of the skin and subcutaneous tissue, unspecified: Secondary | ICD-10-CM | POA: Insufficient documentation

## 2022-10-24 NOTE — Assessment & Plan Note (Signed)
Lab Results  Component Value Date   HGBA1C 5.5 08/19/2021   Stable, pt to continue current medical treatment  - diet, wt control

## 2022-10-24 NOTE — Assessment & Plan Note (Signed)
Also for derm referral per pt request

## 2022-10-24 NOTE — Assessment & Plan Note (Signed)
Mild worsening, for tums prn, declines ppi for now

## 2022-10-24 NOTE — Assessment & Plan Note (Signed)
With high suspicion for recurrent uti - for ua and cx, also augmentin course bid,  to f/u any worsening symptoms or concerns

## 2022-10-26 ENCOUNTER — Ambulatory Visit: Payer: Managed Care, Other (non HMO) | Admitting: Physical Therapy

## 2022-10-26 ENCOUNTER — Encounter: Payer: Self-pay | Admitting: Physical Therapy

## 2022-10-26 VITALS — BP 123/88 | HR 85

## 2022-10-26 DIAGNOSIS — M6281 Muscle weakness (generalized): Secondary | ICD-10-CM

## 2022-10-26 DIAGNOSIS — R278 Other lack of coordination: Secondary | ICD-10-CM

## 2022-10-26 DIAGNOSIS — R2681 Unsteadiness on feet: Secondary | ICD-10-CM

## 2022-10-26 DIAGNOSIS — R2689 Other abnormalities of gait and mobility: Secondary | ICD-10-CM | POA: Diagnosis not present

## 2022-10-26 DIAGNOSIS — R293 Abnormal posture: Secondary | ICD-10-CM

## 2022-10-26 NOTE — Therapy (Signed)
OUTPATIENT PHYSICAL THERAPY NEURO TREATMENT   Patient Name: Larry Riley MRN: [id number] DOB:1980-04-21, 42 y.o., male Today's Date: 10/26/2022   PCP: Corwin Levins., MD REFERRING PROVIDER: Juliann Pares, FNP  END OF SESSION:  PT End of Session - 10/26/22 1454     Visit Number 7    Number of Visits 21    Date for PT Re-Evaluation 12/04/22    Authorization Type [employer]    Authorization - Number of Visits 60    PT Start Time 1452    PT Stop Time 1530    PT Time Calculation (min) 38 min    Equipment Utilized During Treatment Gait belt    Activity Tolerance Patient tolerated treatment well;Patient limited by fatigue    Behavior During Therapy WFL for tasks assessed/performed              Past Medical History:  Diagnosis Date   Anxiety    Depression    GERD (gastroesophageal reflux disease) 01/21/2017   MRSA (methicillin resistant Staphylococcus aureus) 2005   leg   Neurofibromatosis    tumor down spine and brain   Past Surgical History:  Procedure Laterality Date   hydrocephalus     shunt 1999-[place]   NECK SURGERY      tumor removal   SPINE SURGERY  10/2013   c2-t2 fusion  , laminectomy c1-6-- Dr Raynald Kemp-- [place]   VASECTOMY  02/2013   Patient Active Problem List   Diagnosis Date Noted   Skin lesion 10/24/2022   Urinary frequency 10/21/2022   Peripheral edema 08/17/2022   Hypotension 08/17/2022   Urinary incontinence 07/04/2022   Neurofibroma 04/23/2022   Neoplasm of unspecified behavior of bone, soft tissue, and skin 02/16/2022   Acute hearing loss, right 08/04/2021   Hyperglycemia 08/04/2021   Abscess of right foot 08/16/2020   Cellulitis of right foot 08/16/2020   Microhematuria 08/03/2020   Gastroesophageal reflux disease 07/07/2019   External otitis of right ear 03/09/2017   Thumb laceration, right, initial encounter 03/09/2017   Weakness of left side of body 11/19/2016   Stress due to illness of family member 06/10/2016   Syncope  04/23/2016   Onychomycosis 04/23/2016   Impacted ear wax 04/23/2016   Constipation due to opioid therapy 06/13/2014   Chronic pain syndrome 02/21/2014   Allergic reaction 02/20/2014   Neurofibromatosis, type 1 (HCC) 12/14/2013   Status post cervical arthrodesis 11/21/2013   Cervical myelopathy (HCC) 09/21/2013   Encounter for well adult exam with abnormal findings 06/23/2010   Disorder of autonomic nervous system 04/12/2009   Other psoriasis 08/24/2007   Tinea pedis 08/24/2007    ONSET DATE: 04-23-22  REFERRING DIAG:  Diagnosis  D49.2 (ICD-10-CM) - Neoplasm of unspecified behavior of bone, soft tissue, and skin    THERAPY DIAG:  Other abnormalities of gait and mobility  Unsteadiness on feet  Muscle weakness (generalized)  Abnormal posture  Other lack of coordination  Rationale for Evaluation and Treatment: Rehabilitation  SUBJECTIVE STATEMENT:  "Larry Riley"  Patient reports that he is getting more used to his glasses. Denies falls/near falls. States that he tried with the [facility] at home too and continues to be more steady. Was pretty tired after last session.   Pt accompanied by:  spouse, Larry Riley  PERTINENT HISTORY: s/p T11-L1 Laminectomies for intradural tumor resections & L3-L5 laminectomies for intradural tumor resections (hospitalization 04-23-22 - 04-29-22;  Neurofibromatosis 1 diagnosed in 1995  History of foot surgery 08/2020  Patient had metal removed from R foot and infected tissue removed  Neurofibromatosis (CMS/HCC)  Neurofibromatosis, type 1 (von Recklinghausen's disease) (CMS/HCC)  Status post cervical arthrodesis 11/21/2013 - cervical fusion C2-T2, laminectomy C1- C6  PAIN:  Are you having pain? Yes: NPRS scale: 3/10 Pain location: low back; tense in shoulders Aggravating factors:  sitting in recliner chair Relieving factors: Gabapentin Pain was 7-8/10 prior to surgery (on average day) - pt reports pain is much improved since surgery  PRECAUTIONS: Fall  RED FLAGS: Bowel or bladder incontinence: Yes: Improving - pt states he has mostly bladder incontinence at night; little control over starting/stopping  WEIGHT BEARING RESTRICTIONS: No  FALLS: Has patient fallen in last 6 months? Yes. Number of falls 2  LIVING ENVIRONMENT: Lives with: lives with their spouse Lives in: House/apartment Stairs: Yes: External: 2 steps; can reach both Has following equipment at home: Dan Humphreys - 2 wheeled, Wheelchair (manual), Tour manager, and hemiwalker  PLOF: Independent with basic ADLs, Independent with household mobility without device, Independent with community mobility without device, and Independent with transfers; pt drove prior to surgery on 04-23-22 (has not driven since)  PATIENT GOALS: "walk without use of device, as I was prior to surgery"; work on balance  OBJECTIVE:   DIAGNOSTIC FINDINGS: N/A  COGNITION: Overall cognitive status: Within functional limits for tasks assessed   SENSATION: Impaired in bil. LE's   COORDINATION: Decreased in bil. LE's wi  POSTURE: forward head, decreased thoracic kyphosis, and head is laterally flexed to Lt side  LOWER EXTREMITY ROM:   WFL's except for Lt ankle ROM - pt has worn AFO for > 4 yrs Decreased Rt active dorsiflexion and plantarflexion due to weakness  LOWER EXTREMITY MMT:    MMT Right Eval Left Eval  Hip flexion 5 5  Hip extension    Hip abduction    Hip adduction    Hip internal rotation    Hip external rotation    Knee flexion 4+ (seated position) 4 (seated position)  Knee extension 5 5  Ankle dorsiflexion 3- 0-1  Ankle plantarflexion    Ankle inversion    Ankle eversion    (Blank rows = not tested)  BED MOBILITY:  Modified independent   TRANSFERS: Assistive device utilized: Environmental consultant - 2 wheeled  Sit  to stand: Modified independence short distances; SBA community amb. With RW Stand to sit: Modified independence  CURB: TBA  STAIRS:  TBA  GAIT: Gait pattern: step through pattern, decreased ankle dorsiflexion- Right, decreased ankle dorsiflexion- Left, genu recurvatum- Left, and trunk rotated posterior- Left Distance walked: 20' Assistive device utilized: Environmental consultant - 2 wheeled Level of assistance: SBA Comments: AFO worn on LLE - carbon fiber with an anterior guard added (appears to be custom fabricated - pt states he obtained this brace approx. 4 yrs ago)  FUNCTIONAL TESTS:  Timed up and go (TUG): 18.97 secs with RW but pt was not safe - pt was instructed to ambulate at his normal speed but ambulated at fast pace and was unsteady  with turns so score is not totally accurate - CGA was provided for safety 10 meter walk test: 15.94 secs with RW = 2.06 ft/sec Berg Balance Scale: 26/56 3-4   PATIENT SURVEYS:  N/A for his diagnosis - s/p laminectomies for tumor resections  TODAY'S TREATMENT:   Vitals:   10/26/22 1459  BP: 123/88  Pulse: 85    Bioness donned on LLE for neuromuscular activation and re-education see below for details  TherAct: Therapist assisted donning bioness and assisting patient don/doff shoes without built in AFO with minA due to patient not having shoe horn, trialed at various intensity but inability to achieve maximal activation of ankle dorsiflexion even with repositioning, held ankle cuff use at this time and opted for AFO as unable to achieve desired level of foot clearance and stability to patient's maximally tolerated level in today's session, trialed quad cuff on patient but would recommend hamstring use for thigh cuff in future session to minimize hamstring hyperextension through stance. Trialed 1 x 100 feet with thigh cuff alone donned on training gait mode on quad but demonstrated limitations in stability as patient demonstrates baseline tendency toward  hypertension    PATIENT EDUCATION: Education details: Continue HEP + Bioness POC moving forward Person educated: Patient and Spouse Education method: Explanation Education comprehension: verbalized understanding  HOME EXERCISE PROGRAM:  Access Code: [email or web address] URL: [email or web address] Date: 10/13/2022 Prepared by: Maryruth Eve  Exercises - Sit to Stand  - 1 x daily - 7 x weekly - 2-3 sets - 10 reps - Sit to Stand with Resistance Around Legs  - 1 x daily - 7 x weekly - 2-3 sets - 5 reps - Standing March with Unilateral Counter Support  - 1 x daily - 7 x weekly - 3 sets - 20 reps - Standing 3-Way Leg Reach with Resistance at Ankles and Counter Support  - 1 x daily - 7 x weekly - 2-3 sets - 10 reps  Walking with hemiwalker with gait belt and spouse supervision ~1x day  [employer] [email or web address]  GOALS: Goals reviewed with patient? Yes  SHORT TERM GOALS: Target date: 11-06-22 (4 weeks)  Pt will perform sit to stand transfer with 1 UE support only from standard chair and will stabilize upon initial standing without bracing with legs. Baseline:  bil. UE support needed from mat table Goal status: INITIAL  2.  Improve Berg balance test score to >/= 31/56 to demo improved standing balance. Baseline: 26/56 Goal status: INITIAL  3.  Pt will amb. 115' with hemiwalker with CGA on flat, even surface. Baseline: gait with hemiwalker to be assessed Goal status: INITIAL  4.  Increase gait velocity to >/= 2.4 ft/sec with RW for increased gait efficiency. Baseline: 15.94 secs = 2.06 ft/sec Goal status: INITIAL  5.  Improve TUG score to </= 17 secs with RW with pt demonstrating correct hand placement with sit to/from stand transfers and safe speed, I.e. not rushing. Baseline: 18.97 secs with RW but not safe Goal status: INITIAL  6.  Independent with HEP for balance and strengthening exercises. Baseline:  Goal status: INITIAL  LONG TERM GOALS: Target date: 12-04-22 (8  weeks)  Pt will perform sit to stand transfer without UE support from standard chair and will stabilize upon initial standing without bracing with legs. Baseline:  bil. UE support needed from mat table Goal status: INITIAL   2.  Pt will amb. 350' with quad cane with supervision on flat, even surface for increased community accessibility. Baseline: pt using  RW- modified independent household, SBA community amb. Goal status: INITIAL  3.  Improve Berg balance test score to >/= 36/56 to demo improved standing balance. Baseline: 26/56 Goal status: INITIAL  4.   Increase gait velocity to >/= 3.0 ft/sec with RW for increased gait efficiency. Baseline: 15.94 secs = 2.06 ft/sec Goal status: INITIAL  5.  Improve TUG score to </= 15 secs with hemiwalker (or quad cane) with pt demonstrating correct hand placement with sit to/from stand transfers and safe speed, I.e. not rushing. Baseline: 18.97 secs with RW but not safe Goal status: INITIAL  6.  Transfer floor to stand with UE support on sturdy object or furniture with supervision. Baseline: TBA Goal status: INITIAL  ASSESSMENT:  CLINICAL IMPRESSION: Emphasis of skilled PT session on Bioness trial with very limited results in today's session. Opted to not use ankle cuff as patient did not achieve desired level of ankle stability and instead returned patient to AFO shoe. May benefit from second trial to adjust positong to see if able to achieve desire results. Trialed use of quad thigh cuff but would strongly recommend hamstring use in future session if to trial again to limit hypertension through stance. Continue POC.  OBJECTIVE IMPAIRMENTS: Abnormal gait, decreased activity tolerance, decreased balance, decreased coordination, decreased knowledge of use of DME, decreased strength, decreased safety awareness, and impaired sensation.   ACTIVITY LIMITATIONS: carrying, lifting, bending, standing, squatting, stairs, transfers, locomotion level, and  driving  PARTICIPATION LIMITATIONS: meal prep, cleaning, laundry, driving, shopping, community activity, and yard work  PERSONAL FACTORS: Past/current experiences and 1 comorbidity: h/o cervical tumors with s/p cervical fusion  are also affecting patient's functional outcome.   REHAB POTENTIAL: Good  CLINICAL DECISION MAKING: Evolving/moderate complexity  EVALUATION COMPLEXITY: Moderate  PLAN:  PT FREQUENCY:  3x/week for 4 wks followed by 2x/wk for 4 wks  PT DURATION: 8 weeks  PLANNED INTERVENTIONS: Therapeutic exercises, Therapeutic activity, Neuromuscular re-education, Balance training, Gait training, Patient/Family education, Self Care, Stair training, Orthotic/Fit training, DME instructions, and Aquatic Therapy  PLAN FOR NEXT SESSION:  how is HEP? - work on Investment banker, operational with [employer]:  any LLE strengthening exercises - closed chain, tall kneeling, etc.:  standing balance exercises/activities; functional strengthening (sit to stands, squats)  Consider brief Bioness trial again with use of thigh cuff on hamstring and could retry ankle, if response still minimal recommend discontinuing bioness trial at this time and work on other interventions    Maryruth Eve, PT, DPT 10/26/2022, 5:34 PM

## 2022-10-28 ENCOUNTER — Ambulatory Visit: Payer: Managed Care, Other (non HMO) | Admitting: Physical Therapy

## 2022-10-28 VITALS — BP 116/93 | HR 90

## 2022-10-28 DIAGNOSIS — R278 Other lack of coordination: Secondary | ICD-10-CM

## 2022-10-28 DIAGNOSIS — M6281 Muscle weakness (generalized): Secondary | ICD-10-CM

## 2022-10-28 DIAGNOSIS — R2689 Other abnormalities of gait and mobility: Secondary | ICD-10-CM | POA: Diagnosis not present

## 2022-10-28 DIAGNOSIS — R293 Abnormal posture: Secondary | ICD-10-CM

## 2022-10-28 DIAGNOSIS — R2681 Unsteadiness on feet: Secondary | ICD-10-CM

## 2022-10-28 NOTE — Therapy (Signed)
OUTPATIENT PHYSICAL THERAPY NEURO TREATMENT   Patient Name: Larry Riley MRN: [id number] DOB:07-23-1980, 42 y.o., male Today's Date: 10/28/2022   PCP: Corwin Levins., MD REFERRING PROVIDER: Juliann Pares, FNP  END OF SESSION:  PT End of Session - 10/28/22 1446     Visit Number 8    Number of Visits 21    Date for PT Re-Evaluation 12/04/22    Authorization Type [employer]    Authorization - Number of Visits 60    PT Start Time 1447    PT Stop Time 1528    PT Time Calculation (min) 41 min    Equipment Utilized During Treatment Gait belt    Activity Tolerance Patient tolerated treatment well;Patient limited by fatigue    Behavior During Therapy WFL for tasks assessed/performed               Past Medical History:  Diagnosis Date   Anxiety    Depression    GERD (gastroesophageal reflux disease) 01/21/2017   MRSA (methicillin resistant Staphylococcus aureus) 2005   leg   Neurofibromatosis    tumor down spine and brain   Past Surgical History:  Procedure Laterality Date   hydrocephalus     shunt 1999-[place]   NECK SURGERY      tumor removal   SPINE SURGERY  10/2013   c2-t2 fusion  , laminectomy c1-6-- Dr Raynald Kemp-- [place]   VASECTOMY  02/2013   Patient Active Problem List   Diagnosis Date Noted   Skin lesion 10/24/2022   Urinary frequency 10/21/2022   Peripheral edema 08/17/2022   Hypotension 08/17/2022   Urinary incontinence 07/04/2022   Neurofibroma 04/23/2022   Neoplasm of unspecified behavior of bone, soft tissue, and skin 02/16/2022   Acute hearing loss, right 08/04/2021   Hyperglycemia 08/04/2021   Abscess of right foot 08/16/2020   Cellulitis of right foot 08/16/2020   Microhematuria 08/03/2020   Gastroesophageal reflux disease 07/07/2019   External otitis of right ear 03/09/2017   Thumb laceration, right, initial encounter 03/09/2017   Weakness of left side of body 11/19/2016   Stress due to illness of family member 06/10/2016   Syncope  04/23/2016   Onychomycosis 04/23/2016   Impacted ear wax 04/23/2016   Constipation due to opioid therapy 06/13/2014   Chronic pain syndrome 02/21/2014   Allergic reaction 02/20/2014   Neurofibromatosis, type 1 (HCC) 12/14/2013   Status post cervical arthrodesis 11/21/2013   Cervical myelopathy (HCC) 09/21/2013   Encounter for well adult exam with abnormal findings 06/23/2010   Disorder of autonomic nervous system 04/12/2009   Other psoriasis 08/24/2007   Tinea pedis 08/24/2007    ONSET DATE: 04-23-22  REFERRING DIAG:  Diagnosis  D49.2 (ICD-10-CM) - Neoplasm of unspecified behavior of bone, soft tissue, and skin    THERAPY DIAG:  Other abnormalities of gait and mobility  Unsteadiness on feet  Muscle weakness (generalized)  Abnormal posture  Other lack of coordination  Rationale for Evaluation and Treatment: Rehabilitation  SUBJECTIVE STATEMENT:  "Larry Riley"   Pretty good. "Sort of a wash" after the bioness. Some gasto issues may be aggravating pain.   Wants to work on in PT: core, weighted strengthening, and things that he cannot do at home yet.  Pt accompanied by:  spouse, [name]  PERTINENT HISTORY: s/p T11-L1 Laminectomies for intradural tumor resections & L3-L5 laminectomies for intradural tumor resections (hospitalization 04-23-22 - 04-29-22;  Neurofibromatosis 1 diagnosed in 1995  History of foot surgery 08/2020  Patient had metal removed from R foot and infected tissue removed  Neurofibromatosis (CMS/HCC)  Neurofibromatosis, type 1 (von Recklinghausen's disease) (CMS/HCC)  Status post cervical arthrodesis 11/21/2013 - cervical fusion C2-T2, laminectomy C1- C6  PAIN:  Are you having pain? Yes: NPRS scale: 6-7/10 Pain location: low back "stiff" Aggravating factors: sitting in recliner  chair Relieving factors: Gabapentin Pain was 7-8/10 prior to surgery (on average day) - pt reports pain is much improved since surgery  PRECAUTIONS: Fall  RED FLAGS: Bowel or bladder incontinence: Yes: Improving - pt states he has mostly bladder incontinence at night; little control over starting/stopping  WEIGHT BEARING RESTRICTIONS: No  FALLS: Has patient fallen in last 6 months? Yes. Number of falls 2  LIVING ENVIRONMENT: Lives with: lives with their spouse Lives in: House/apartment Stairs: Yes: External: 2 steps; can reach both Has following equipment at home: Dan Humphreys - 2 wheeled, Wheelchair (manual), Tour manager, and hemiwalker  PLOF: Independent with basic ADLs, Independent with household mobility without device, Independent with community mobility without device, and Independent with transfers; pt drove prior to surgery on 04-23-22 (has not driven since)  PATIENT GOALS: "walk without use of device, as I was prior to surgery"; work on balance  OBJECTIVE:   DIAGNOSTIC FINDINGS: N/A  COGNITION: Overall cognitive status: Within functional limits for tasks assessed   SENSATION: Impaired in bil. LE's   COORDINATION: Decreased in bil. LE's wi  POSTURE: forward head, decreased thoracic kyphosis, and head is laterally flexed to Lt side  LOWER EXTREMITY ROM:   WFL's except for Lt ankle ROM - pt has worn AFO for > 4 yrs Decreased Rt active dorsiflexion and plantarflexion due to weakness  LOWER EXTREMITY MMT:    MMT Right Eval Left Eval  Hip flexion 5 5  Hip extension    Hip abduction    Hip adduction    Hip internal rotation    Hip external rotation    Knee flexion 4+ (seated position) 4 (seated position)  Knee extension 5 5  Ankle dorsiflexion 3- 0-1  Ankle plantarflexion    Ankle inversion    Ankle eversion    (Blank rows = not tested)  BED MOBILITY:  Modified independent   TRANSFERS: Assistive device utilized: Environmental consultant - 2 wheeled  Sit to stand: Modified  independence short distances; SBA community amb. With RW Stand to sit: Modified independence  CURB: TBA  STAIRS:  TBA  GAIT: Gait pattern: step through pattern, decreased ankle dorsiflexion- Right, decreased ankle dorsiflexion- Left, genu recurvatum- Left, and trunk rotated posterior- Left Distance walked: 40' Assistive device utilized: Environmental consultant - 2 wheeled Level of assistance: SBA Comments: AFO worn on LLE - carbon fiber with an anterior guard added (appears to be custom fabricated - pt states he obtained this brace approx. 4 yrs ago)  FUNCTIONAL TESTS:  Timed up and go (TUG): 18.97 secs with RW but pt was not safe - pt was instructed to ambulate at his normal speed but ambulated at fast pace and was unsteady with turns  so score is not totally accurate - CGA was provided for safety 10 meter walk test: 15.94 secs with RW = 2.06 ft/sec Berg Balance Scale: 26/56 3-4   PATIENT SURVEYS:  N/A for his diagnosis - s/p laminectomies for tumor resections  TODAY'S TREATMENT:  TherAct Vitals:   10/28/22 1453  BP: (!) 116/93  Pulse: 90   Gait Training Outside ambulation w/ SBQC  Gait pattern: step through pattern, decreased ankle dorsiflexion- Right, decreased ankle dorsiflexion- Left, genu recurvatum- Left, and trunk rotated posterior- Left Distance walked: 330 ft Assistive device utilized: Quad cane small base Level of assistance: CGA>Mod A Comments: Ambulated over various surfaces including tile, thresholds, rubber entry mats, sidewalk, up and down ramps, 180* turns One LOB when turning requiring Mod A to correct, pt required seated rest break before returning inside using rolling walker   NMR Seated flexion with ball roll out forwards 3 reps x30 sec hold stretches Progressed to each side 1 rep x 30 sec hold each way  Tall kneel activities w/ airex pad under knees for comfort palloff press against green band w/ light pull resistance 3 sets x8 reps per side chop/lift 6# ball 2 sets  x8 reps each direction Hip hinges 2 sets x10 reps  PATIENT EDUCATION:  Education details: Continue HEP Person educated: Patient and Spouse Education method: Explanation Education comprehension: verbalized understanding  HOME EXERCISE PROGRAM:  Access Code: [email or web address] URL: [email or web address] Date: 10/13/2022 Prepared by: Maryruth Eve  Exercises - Sit to Stand  - 1 x daily - 7 x weekly - 2-3 sets - 10 reps - Sit to Stand with Resistance Around Legs  - 1 x daily - 7 x weekly - 2-3 sets - 5 reps - Standing March with Unilateral Counter Support  - 1 x daily - 7 x weekly - 3 sets - 20 reps - Standing 3-Way Leg Reach with Resistance at Ankles and Counter Support  - 1 x daily - 7 x weekly - 2-3 sets - 10 reps  Walking with hemiwalker with gait belt and spouse supervision ~1x day  [employer] [email or web address]  GOALS: Goals reviewed with patient? Yes  SHORT TERM GOALS: Target date: 11-06-22 (4 weeks)  Pt will perform sit to stand transfer with 1 UE support only from standard chair and will stabilize upon initial standing without bracing with legs. Baseline:  bil. UE support needed from mat table Goal status: INITIAL  2.  Improve Berg balance test score to >/= 31/56 to demo improved standing balance. Baseline: 26/56 Goal status: INITIAL  3.  Pt will amb. 115' with hemiwalker with CGA on flat, even surface. Baseline: gait with hemiwalker to be assessed Goal status: INITIAL  4.  Increase gait velocity to >/= 2.4 ft/sec with RW for increased gait efficiency. Baseline: 15.94 secs = 2.06 ft/sec Goal status: INITIAL  5.  Improve TUG score to </= 17 secs with RW with pt demonstrating correct hand placement with sit to/from stand transfers and safe speed, I.e. not rushing. Baseline: 18.97 secs with RW but not safe Goal status: INITIAL  6.  Independent with HEP for balance and strengthening exercises. Baseline:  Goal status: INITIAL  LONG TERM GOALS: Target date: 12-04-22  (8 weeks)  Pt will perform sit to stand transfer without UE support from standard chair and will stabilize upon initial standing without bracing with legs. Baseline:  bil. UE support needed from mat table Goal status: INITIAL   2.  Pt will amb. 350' with quad cane with supervision on  flat, even surface for increased community accessibility. Baseline: pt using RW- modified independent household, SBA community amb. Goal status: INITIAL  3.  Improve Berg balance test score to >/= 36/56 to demo improved standing balance. Baseline: 26/56 Goal status: INITIAL  4.   Increase gait velocity to >/= 3.0 ft/sec with RW for increased gait efficiency. Baseline: 15.94 secs = 2.06 ft/sec Goal status: INITIAL  5.  Improve TUG score to </= 15 secs with hemiwalker (or quad cane) with pt demonstrating correct hand placement with sit to/from stand transfers and safe speed, I.e. not rushing. Baseline: 18.97 secs with RW but not safe Goal status: INITIAL  6.  Transfer floor to stand with UE support on sturdy object or furniture with supervision. Baseline: TBA Goal status: INITIAL  ASSESSMENT:  CLINICAL IMPRESSION:  Emphasis of skilled PT session on ambulation w/ SBQC outdoors, low back stretching, and core and LE strengthening in tall kneeling. One LOB during outdoors ambulation w/ SBQC when turning requiring Mod A to recover standing balance, pt with difficulty going up inclines during this activity. Pt tolerated tall kneeling strengthening activities well and benefited from low back stretches. Continue POC.  OBJECTIVE IMPAIRMENTS: Abnormal gait, decreased activity tolerance, decreased balance, decreased coordination, decreased knowledge of use of DME, decreased strength, decreased safety awareness, and impaired sensation.   ACTIVITY LIMITATIONS: carrying, lifting, bending, standing, squatting, stairs, transfers, locomotion level, and driving  PARTICIPATION LIMITATIONS: meal prep, cleaning, laundry,  driving, shopping, community activity, and yard work  PERSONAL FACTORS: Past/current experiences and 1 comorbidity: h/o cervical tumors with s/p cervical fusion  are also affecting patient's functional outcome.   REHAB POTENTIAL: Good  CLINICAL DECISION MAKING: Evolving/moderate complexity  EVALUATION COMPLEXITY: Moderate  PLAN:  PT FREQUENCY:  3x/week for 4 wks followed by 2x/wk for 4 wks  PT DURATION: 8 weeks  PLANNED INTERVENTIONS: Therapeutic exercises, Therapeutic activity, Neuromuscular re-education, Balance training, Gait training, Patient/Family education, Self Care, Stair training, Orthotic/Fit training, DME instructions, and Aquatic Therapy  PLAN FOR NEXT SESSION:  how is HEP? - work on Investment banker, operational with SBQC:  any LLE strengthening exercises - closed chain, tall kneeling, etc.:  standing balance exercises/activities; functional strengthening (sit to stands, squats), quadruped? Prone? Ramps and curbs w/ cane.   Consider brief Bioness trial again with use of thigh cuff on hamstring and could retry ankle, if response still minimal recommend discontinuing bioness trial at this time and work on other interventions    Beverely Low, SPT  10/28/2022, 5:12 PM

## 2022-10-30 ENCOUNTER — Ambulatory Visit: Payer: Managed Care, Other (non HMO) | Admitting: Physical Therapy

## 2022-10-30 VITALS — BP 111/80 | HR 91

## 2022-10-30 DIAGNOSIS — R278 Other lack of coordination: Secondary | ICD-10-CM

## 2022-10-30 DIAGNOSIS — R2689 Other abnormalities of gait and mobility: Secondary | ICD-10-CM

## 2022-10-30 DIAGNOSIS — R293 Abnormal posture: Secondary | ICD-10-CM

## 2022-10-30 DIAGNOSIS — R2681 Unsteadiness on feet: Secondary | ICD-10-CM

## 2022-10-30 DIAGNOSIS — M6281 Muscle weakness (generalized): Secondary | ICD-10-CM

## 2022-10-30 NOTE — Therapy (Addendum)
OUTPATIENT PHYSICAL THERAPY NEURO TREATMENT   Patient Name: Larry Riley MRN: [id number] DOB:1980/12/02, 42 y.o., male Today's Date: 10/30/2022   PCP: Corwin Levins., MD REFERRING PROVIDER: Juliann Pares, FNP  END OF SESSION:  PT End of Session - 10/30/22 1403     Visit Number 9    Number of Visits 21    Date for PT Re-Evaluation 12/04/22    Authorization Type [employer]    Authorization - Number of Visits 60    PT Start Time 1403    Equipment Utilized During Treatment Gait belt    Activity Tolerance Patient tolerated treatment well;Patient limited by fatigue    Behavior During Therapy [facility] for tasks assessed/performed                Past Medical History:  Diagnosis Date   Anxiety    Depression    GERD (gastroesophageal reflux disease) 01/21/2017   MRSA (methicillin resistant Staphylococcus aureus) 2005   leg   Neurofibromatosis    tumor down spine and brain   Past Surgical History:  Procedure Laterality Date   hydrocephalus     shunt 1999-[place]   NECK SURGERY      tumor removal   SPINE SURGERY  10/2013   c2-t2 fusion  , laminectomy c1-6-- Dr Raynald Kemp-- [place]   VASECTOMY  02/2013   Patient Active Problem List   Diagnosis Date Noted   Skin lesion 10/24/2022   Urinary frequency 10/21/2022   Peripheral edema 08/17/2022   Hypotension 08/17/2022   Urinary incontinence 07/04/2022   Neurofibroma 04/23/2022   Neoplasm of unspecified behavior of bone, soft tissue, and skin 02/16/2022   Acute hearing loss, right 08/04/2021   Hyperglycemia 08/04/2021   Abscess of right foot 08/16/2020   Cellulitis of right foot 08/16/2020   Microhematuria 08/03/2020   Gastroesophageal reflux disease 07/07/2019   External otitis of right ear 03/09/2017   Thumb laceration, right, initial encounter 03/09/2017   Weakness of left side of body 11/19/2016   Stress due to illness of family member 06/10/2016   Syncope 04/23/2016   Onychomycosis 04/23/2016   Impacted ear wax  04/23/2016   Constipation due to opioid therapy 06/13/2014   Chronic pain syndrome 02/21/2014   Allergic reaction 02/20/2014   Neurofibromatosis, type 1 (HCC) 12/14/2013   Status post cervical arthrodesis 11/21/2013   Cervical myelopathy (HCC) 09/21/2013   Encounter for well adult exam with abnormal findings 06/23/2010   Disorder of autonomic nervous system 04/12/2009   Other psoriasis 08/24/2007   Tinea pedis 08/24/2007    ONSET DATE: 04-23-22  REFERRING DIAG:  Diagnosis  D49.2 (ICD-10-CM) - Neoplasm of unspecified behavior of bone, soft tissue, and skin    THERAPY DIAG:  Other abnormalities of gait and mobility  Unsteadiness on feet  Muscle weakness (generalized)  Abnormal posture  Other lack of coordination  Rationale for Evaluation and Treatment: Rehabilitation  SUBJECTIVE STATEMENT:  "Larry Riley"   Yesterday walked multiple laps indoors with wife, practiced pivoting a lot. Denies falls or acute changes. Reports low back pain/sacral pain that occurs most often after sitting for a long period of time. Points to an area below his lower spinal incision.  Pt accompanied by:  spouse, [name]  PERTINENT HISTORY: s/p T11-L1 Laminectomies for intradural tumor resections & L3-L5 laminectomies for intradural tumor resections (hospitalization 04-23-22 - 04-29-22;  Neurofibromatosis 1 diagnosed in 1995  History of foot surgery 08/2020  Patient had metal removed from R foot and infected tissue removed  Neurofibromatosis (CMS/HCC)  Neurofibromatosis, type 1 (von Recklinghausen's disease) (CMS/HCC)  Status post cervical arthrodesis 11/21/2013 - cervical fusion C2-T2, laminectomy C1- C6  PAIN:  Are you having pain? Yes: NPRS scale: 6/10 Pain location: low back "stiff" Aggravating factors: sitting in  recliner chair Relieving factors: Gabapentin Pain was 7-8/10 prior to surgery (on average day) - pt reports pain is much improved since surgery  PRECAUTIONS: Fall  RED FLAGS: Bowel or bladder incontinence: Yes: Improving - pt states he has mostly bladder incontinence at night; little control over starting/stopping  WEIGHT BEARING RESTRICTIONS: No  FALLS: Has patient fallen in last 6 months? Yes. Number of falls 2  LIVING ENVIRONMENT: Lives with: lives with their spouse Lives in: House/apartment Stairs: Yes: External: 2 steps; can reach both Has following equipment at home: Dan Humphreys - 2 wheeled, Wheelchair (manual), Tour manager, and hemiwalker  PLOF: Independent with basic ADLs, Independent with household mobility without device, Independent with community mobility without device, and Independent with transfers; pt drove prior to surgery on 04-23-22 (has not driven since)  PATIENT GOALS: "walk without use of device, as I was prior to surgery"; work on balance  OBJECTIVE:   DIAGNOSTIC FINDINGS: N/A  COGNITION: Overall cognitive status: Within functional limits for tasks assessed   SENSATION: Impaired in bil. LE's   COORDINATION: Decreased in bil. LE's wi  POSTURE: forward head, decreased thoracic kyphosis, and head is laterally flexed to Lt side  LOWER EXTREMITY ROM:   WFL's except for Lt ankle ROM - pt has worn AFO for > 4 yrs Decreased Rt active dorsiflexion and plantarflexion due to weakness  LOWER EXTREMITY MMT:    MMT Right Eval Left Eval  Hip flexion 5 5  Hip extension    Hip abduction    Hip adduction    Hip internal rotation    Hip external rotation    Knee flexion 4+ (seated position) 4 (seated position)  Knee extension 5 5  Ankle dorsiflexion 3- 0-1  Ankle plantarflexion    Ankle inversion    Ankle eversion    (Blank rows = not tested)  BED MOBILITY:  Modified independent   TRANSFERS: Assistive device utilized: Environmental consultant - 2 wheeled  Sit to stand:  Modified independence short distances; SBA community amb. With RW Stand to sit: Modified independence  CURB: TBA  STAIRS:  TBA  GAIT: Gait pattern: step through pattern, decreased ankle dorsiflexion- Right, decreased ankle dorsiflexion- Left, genu recurvatum- Left, and trunk rotated posterior- Left Distance walked: 86' Assistive device utilized: Environmental consultant - 2 wheeled Level of assistance: SBA Comments: AFO worn on LLE - carbon fiber with an anterior guard added (appears to be custom fabricated - pt states he obtained this brace approx. 4 yrs ago)  FUNCTIONAL TESTS:  Timed up and go (TUG): 18.97 secs with RW but pt was not safe - pt was instructed to ambulate at his normal speed but ambulated at fast  pace and was unsteady with turns so score is not totally accurate - CGA was provided for safety 10 meter walk test: 15.94 secs with RW = 2.06 ft/sec Berg Balance Scale: 26/56 3-4   PATIENT SURVEYS:  N/A for his diagnosis - s/p laminectomies for tumor resections  TODAY'S TREATMENT:  TherAct Vitals:   10/30/22 1410  BP: 111/80  Pulse: 91    Gait Training Outside ambulation w/ personal SBQC Gait pattern: step through pattern, decreased ankle dorsiflexion- Right, decreased ankle dorsiflexion- Left, genu recurvatum- Left, and trunk rotated posterior- Left Distance walked: 160 ft, then 120 ft Assistive device utilized: Quad cane small base Level of assistance: CGA>Mod A Comments: Ambulated over various surfaces including tile, thresholds, rubber entry mats, sidewalk, up and down ramps, 180* turns One LOB when turning requiring Mod A to correct, pt required seated rest break half-way through 2/2 back pain and before returning inside using rolling walker   TherEx Seated flexion with ball roll out forwards 3 reps x30 sec hold stretches Progressed to each side 1 rep x 30 sec hold each way Supine bent-knee lumbar rotations x1 min Pt reports alleviating of pain Knee to chest supine x30 sec  hold performed bilaterally progress to more medial pull x30 sec hold Supine figure 4 piriformis stretch bilaterally x30 sec hold  NMR for core activation and strengthening Supine posterior pelvic tilt holds 5 reps x5 second holds, repeated cueing for breathing Pelvic tilts with glute bridges x10 reps w/ 3 second hold in bridge Pelvic tilts with supine marches x10 reps bilaterally, pt has to reset pelvic tilt frequently  Verbally added stretches and core activities to HEP, pt and wife declined handout  PATIENT EDUCATION:  Education details: Continue HEP Person educated: Patient and Spouse Education method: Explanation Education comprehension: verbalized understanding  HOME EXERCISE PROGRAM:  Access Code: [email or web address] URL: [email or web address] Date: 10/13/2022 Prepared by: Maryruth Eve  Exercises - Sit to Stand  - 1 x daily - 7 x weekly - 2-3 sets - 10 reps - Sit to Stand with Resistance Around Legs  - 1 x daily - 7 x weekly - 2-3 sets - 5 reps - Standing March with Unilateral Counter Support  - 1 x daily - 7 x weekly - 3 sets - 20 reps - Standing 3-Way Leg Reach with Resistance at Ankles and Counter Support  - 1 x daily - 7 x weekly - 2-3 sets - 10 reps  Walking with hemiwalker with gait belt and spouse supervision ~1x day  Med[employer] [email or web address]  GOALS: Goals reviewed with patient? Yes  SHORT TERM GOALS: Target date: 11-06-22 (4 weeks)  Pt will perform sit to stand transfer with 1 UE support only from standard chair and will stabilize upon initial standing without bracing with legs. Baseline:  bil. UE support needed from mat table Goal status: INITIAL  2.  Improve Berg balance test score to >/= 31/56 to demo improved standing balance. Baseline: 26/56 Goal status: INITIAL  3.  Pt will amb. 115' with hemiwalker with CGA on flat, even surface. Baseline: gait with hemiwalker to be assessed Goal status: INITIAL  4.  Increase gait velocity to >/= 2.4 ft/sec  with RW for increased gait efficiency. Baseline: 15.94 secs = 2.06 ft/sec Goal status: INITIAL  5.  Improve TUG score to </= 17 secs with RW with pt demonstrating correct hand placement with sit to/from stand transfers and safe speed, I.e. not rushing. Baseline: 18.97 secs with RW but not safe Goal status: INITIAL  6.  Independent with HEP for balance and strengthening exercises. Baseline:  Goal status: INITIAL  LONG TERM GOALS: Target date: 12-04-22 (8 weeks)  Pt will perform sit to stand transfer without UE support from standard chair and will stabilize upon initial standing without bracing with legs. Baseline:  bil. UE support needed from mat table Goal status: INITIAL   2.  Pt will amb. 350' with quad cane with supervision on flat, even surface for increased community accessibility. Baseline: pt using RW- modified independent household, SBA community amb. Goal status: INITIAL  3.  Improve Berg balance test score to >/= 36/56 to demo improved standing balance. Baseline: 26/56 Goal status: INITIAL  4.   Increase gait velocity to >/= 3.0 ft/sec with RW for increased gait efficiency. Baseline: 15.94 secs = 2.06 ft/sec Goal status: INITIAL  5.  Improve TUG score to </= 15 secs with hemiwalker (or quad cane) with pt demonstrating correct hand placement with sit to/from stand transfers and safe speed, I.e. not rushing. Baseline: 18.97 secs with RW but not safe Goal status: INITIAL  6.  Transfer floor to stand with UE support on sturdy object or furniture with supervision. Baseline: TBA Goal status: INITIAL  ASSESSMENT:  CLINICAL IMPRESSION:  Emphasis of skilled PT session on ambulation w/ SBQC outdoors, low back stretching, and core strengthening in supine. One LOB during outdoors ambulation w/ SBQC when going up mild incline, Mod A to recover standing balance, pt improving in confidence with outdoor walking but still limited by low back pain. Trialed various low back stretches  to be used as needed at home and practiced core activation with posterior pelvic tilts, progressing by adding LE movements. Pt tolerated treatment well, Continue POC.  OBJECTIVE IMPAIRMENTS: Abnormal gait, decreased activity tolerance, decreased balance, decreased coordination, decreased knowledge of use of DME, decreased strength, decreased safety awareness, and impaired sensation.   ACTIVITY LIMITATIONS: carrying, lifting, bending, standing, squatting, stairs, transfers, locomotion level, and driving  PARTICIPATION LIMITATIONS: meal prep, cleaning, laundry, driving, shopping, community activity, and yard work  PERSONAL FACTORS: Past/current experiences and 1 comorbidity: h/o cervical tumors with s/p cervical fusion  are also affecting patient's functional outcome.   REHAB POTENTIAL: Good  CLINICAL DECISION MAKING: Evolving/moderate complexity  EVALUATION COMPLEXITY: Moderate  PLAN:  PT FREQUENCY:  3x/week for 4 wks followed by 2x/wk for 4 wks  PT DURATION: 8 weeks  PLANNED INTERVENTIONS: Therapeutic exercises, Therapeutic activity, Neuromuscular re-education, Balance training, Gait training, Patient/Family education, Self Care, Stair training, Orthotic/Fit training, DME instructions, and Aquatic Therapy  PLAN FOR NEXT SESSION:  10th PN, how is HEP? - work on Investment banker, operational with SBQC:  any LLE strengthening exercises - closed chain, tall kneeling, etc.:  standing balance exercises/activities; functional strengthening (sit to stands, squats), quadruped? Prone? Ramps and curbs w/ cane, core strengthening, low back stretches   Consider brief Bioness trial again with use of thigh cuff on hamstring and could retry ankle, if response still minimal recommend discontinuing bioness trial at this time and work on other interventions    Beverely Low, SPT  10/30/2022, 2:12 PM

## 2022-11-03 ENCOUNTER — Encounter: Payer: Self-pay | Admitting: Physical Therapy

## 2022-11-03 ENCOUNTER — Ambulatory Visit: Payer: Managed Care, Other (non HMO) | Admitting: Physical Therapy

## 2022-11-03 DIAGNOSIS — M6281 Muscle weakness (generalized): Secondary | ICD-10-CM

## 2022-11-03 DIAGNOSIS — R2689 Other abnormalities of gait and mobility: Secondary | ICD-10-CM | POA: Diagnosis not present

## 2022-11-03 NOTE — Therapy (Signed)
OUTPATIENT PHYSICAL THERAPY NEURO TREATMENT   Patient Name: Larry Riley MRN: [id number] DOB:1980/12/14, 42 y.o., male Today's Date: 11/03/2022   PCP: Larry Levins., MD REFERRING PROVIDER: Juliann Pares, FNP  END OF SESSION:  PT End of Session - 11/03/22 2011     Visit Number 10    Number of Visits 21    Date for PT Re-Evaluation 12/04/22    Authorization Type [employer]    Authorization - Visit Number 10    Authorization - Number of Visits 60    PT Start Time 1450    PT Stop Time 1533    PT Time Calculation (min) 43 min    Equipment Utilized During Treatment --    Activity Tolerance Patient tolerated treatment well    Behavior During Therapy [facility] for tasks assessed/performed                 Past Medical History:  Diagnosis Date   Anxiety    Depression    GERD (gastroesophageal reflux disease) 01/21/2017   MRSA (methicillin resistant Staphylococcus aureus) 2005   leg   Neurofibromatosis    tumor down spine and brain   Past Surgical History:  Procedure Laterality Date   hydrocephalus     shunt 1999-[place]   NECK SURGERY      tumor removal   SPINE SURGERY  10/2013   c2-t2 fusion  , laminectomy c1-6-- Dr Raynald Kemp-- [place]   VASECTOMY  02/2013   Patient Active Problem List   Diagnosis Date Noted   Skin lesion 10/24/2022   Urinary frequency 10/21/2022   Peripheral edema 08/17/2022   Hypotension 08/17/2022   Urinary incontinence 07/04/2022   Neurofibroma 04/23/2022   Neoplasm of unspecified behavior of bone, soft tissue, and skin 02/16/2022   Acute hearing loss, right 08/04/2021   Hyperglycemia 08/04/2021   Abscess of right foot 08/16/2020   Cellulitis of right foot 08/16/2020   Microhematuria 08/03/2020   Gastroesophageal reflux disease 07/07/2019   External otitis of right ear 03/09/2017   Thumb laceration, right, initial encounter 03/09/2017   Weakness of left side of body 11/19/2016   Stress due to illness of family member 06/10/2016    Syncope 04/23/2016   Onychomycosis 04/23/2016   Impacted ear wax 04/23/2016   Constipation due to opioid therapy 06/13/2014   Chronic pain syndrome 02/21/2014   Allergic reaction 02/20/2014   Neurofibromatosis, type 1 (HCC) 12/14/2013   Status post cervical arthrodesis 11/21/2013   Cervical myelopathy (HCC) 09/21/2013   Encounter for well adult exam with abnormal findings 06/23/2010   Disorder of autonomic nervous system 04/12/2009   Other psoriasis 08/24/2007   Tinea pedis 08/24/2007    ONSET DATE: 04-23-22  REFERRING DIAG:  Diagnosis  D49.2 (ICD-10-CM) - Neoplasm of unspecified behavior of bone, soft tissue, and skin    THERAPY DIAG:  Muscle weakness (generalized)  Rationale for Evaluation and Treatment: Rehabilitation  SUBJECTIVE STATEMENT:  "Larry Riley"   Pt reports he did a lot of exercises at home yesterday evening - went to bed at 9:00 because he was so tired. Wife reports they have been practicing walking in the hallway in their home with use of [facility] - also practicing turning with this ambulation.  Pt using RW for assistance with amb. To today's session.   Pt accompanied by:  spouse, Larry Riley  PERTINENT HISTORY: s/p T11-L1 Laminectomies for intradural tumor resections & L3-L5 laminectomies for intradural tumor resections (hospitalization 04-23-22 - 04-29-22;  Neurofibromatosis 1 diagnosed in 1995  History of foot surgery 08/2020  Patient had metal removed from R foot and infected tissue removed  Neurofibromatosis (CMS/HCC)  Neurofibromatosis, type 1 (von Recklinghausen's disease) (CMS/HCC)  Status post cervical arthrodesis 11/21/2013 - cervical fusion C2-T2, laminectomy C1- C6  PAIN:  Are you having pain? Yes: NPRS scale: 6/10 Pain location: low back "stiff" Aggravating factors: sitting in  recliner chair Relieving factors: Gabapentin Pain was 7-8/10 prior to surgery (on average day) - pt reports pain is much improved since surgery  PRECAUTIONS: Fall  RED FLAGS: Bowel or bladder incontinence: Yes: Improving - pt states he has mostly bladder incontinence at night; little control over starting/stopping  WEIGHT BEARING RESTRICTIONS: No  FALLS: Has patient fallen in last 6 months? Yes. Number of falls 2  LIVING ENVIRONMENT: Lives with: lives with their spouse Lives in: House/apartment Stairs: Yes: External: 2 steps; can reach both Has following equipment at home: Dan Humphreys - 2 wheeled, Wheelchair (manual), Tour manager, and hemiwalker  PLOF: Independent with basic ADLs, Independent with household mobility without device, Independent with community mobility without device, and Independent with transfers; pt drove prior to surgery on 04-23-22 (has not driven since)  PATIENT GOALS: "walk without use of device, as I was prior to surgery"; work on balance  OBJECTIVE:   DIAGNOSTIC FINDINGS: N/A  COGNITION: Overall cognitive status: Within functional limits for tasks assessed   SENSATION: Impaired in bil. LE's   COORDINATION: Decreased in bil. LE's wi  POSTURE: forward head, decreased thoracic kyphosis, and head is laterally flexed to Lt side  LOWER EXTREMITY ROM:   WFL's except for Lt ankle ROM - pt has worn AFO for > 4 yrs Decreased Rt active dorsiflexion and plantarflexion due to weakness  LOWER EXTREMITY MMT:    MMT Right Eval Left Eval  Hip flexion 5 5  Hip extension    Hip abduction    Hip adduction    Hip internal rotation    Hip external rotation    Knee flexion 4+ (seated position) 4 (seated position)  Knee extension 5 5  Ankle dorsiflexion 3- 0-1  Ankle plantarflexion    Ankle inversion    Ankle eversion    (Blank rows = not tested)  BED MOBILITY:  Modified independent   TRANSFERS: Assistive device utilized: Environmental consultant - 2 wheeled  Sit to stand:  Modified independence short distances; SBA community amb. With RW Stand to sit: Modified independence  CURB: TBA  STAIRS:  TBA  GAIT: Gait pattern: step through pattern, decreased ankle dorsiflexion- Right, decreased ankle dorsiflexion- Left, genu recurvatum- Left, and trunk rotated posterior- Left Distance walked: 29' Assistive device utilized: Environmental consultant - 2 wheeled Level of assistance: SBA Comments: AFO worn on LLE - carbon fiber with an anterior guard added (appears to be custom fabricated - pt states he obtained this brace approx. 4 yrs ago)  FUNCTIONAL TESTS:  Timed up and go (TUG): 18.97 secs with RW but pt  was not safe - pt was instructed to ambulate at his normal speed but ambulated at fast pace and was unsteady with turns so score is not totally accurate - CGA was provided for safety 10 meter walk test: 15.94 secs with RW = 2.06 ft/sec Berg Balance Scale: 26/56 3-4   PATIENT SURVEYS:  N/A for his diagnosis - s/p laminectomies for tumor resections  TODAY'S TREATMENT:  TherEx: Pelvic tilt 5 reps with 3-4 sec hold Pelvic tilt with hip flexion in hooklying position - 10 reps each leg Bridging x 10 reps Bridging with hip abdct./adduction 5 reps Bridging with RLE extension 5 reps;  attempted with LLE extension, but pt unable to extend Lt leg due to fatigue  Lt knee flexion prone 10 reps with min assist Lt hip extension with Lt knee flexed at 90 degrees with min assist to hold Lt knee in flexed position Lt clam shell exercise 10 reps x 2 sets - min assist for correct positioning  Attempted Lt hip abduction in Rt sidelying - pt unable to do this exercise due to Lt hip abdct. weakness  Tall kneeling position - kay bench in front - pt held position for 10 secs without UE support; performed horizontal head turns 5 reps with CGA, vertical head turns 5 reps  Performed moving ball in diagonal patterns "X" 5 reps each in tall kneeling position with CGA  Mini squats 10 reps in tall  kneeling - small range   Lt sidesitting position - leaning on Lt forearm on mat - abdominal/oblique strengthening by elevating trunk /ribcage - 10 reps  Pt performed low back stretches with green physioball - rolling ball forward, then forward and to Lt side for Rt low back musc. Stretching - approx. 3 reps 10 sec hold   PATIENT EDUCATION:  Education details: HEP updated - Medbridge [id number] -see below Person educated: Patient and Spouse Education method: Explanation Education comprehension: verbalized understanding  HOME EXERCISE PROGRAM:  Access Code: [email or web address] URL: [email or web address] Date: 11/03/2022 Prepared by: Maebelle Munroe  Exercises - Prone Knee Flexion AROM  - 1 x daily - 7 x weekly - 1 sets - 10 reps - 3 sec hold - Beginner Clam  - 1 x daily - 7 x weekly - 1 sets - 10 reps - Hooklying Isometric Clamshell  - 1 x daily - 7 x weekly - 1 sets - 10 reps - 3 sec hold - SEATED SIDE PLANK - LEFT SIDE  - 1 x daily - 7 x weekly - 3 sets - 10 reps   Access Code: [email or web address] URL: [email or web address] Date: 10/13/2022 Prepared by: Maryruth Eve  Exercises - Sit to Stand  - 1 x daily - 7 x weekly - 2-3 sets - 10 reps - Sit to Stand with Resistance Around Legs  - 1 x daily - 7 x weekly - 2-3 sets - 5 reps - Standing March with Unilateral Counter Support  - 1 x daily - 7 x weekly - 3 sets - 20 reps - Standing 3-Way Leg Reach with Resistance at Ankles and Counter Support  - 1 x daily - 7 x weekly - 2-3 sets - 10 reps  Walking with hemiwalker with gait belt and spouse supervision ~1x day  [employer] [email or web address]  GOALS: Goals reviewed with patient? Yes  SHORT TERM GOALS: Target date: 11-06-22 (4 weeks)  Pt will perform sit to stand transfer with 1 UE support only from standard chair and will stabilize upon initial standing without bracing with legs. Baseline:  bil. UE support needed from mat table Goal status: INITIAL  2.  Improve Berg balance test  score to >/= 31/56 to demo improved standing balance. Baseline: 26/56 Goal status: INITIAL  3.  Pt will amb. 115' with hemiwalker with CGA on flat, even surface. Baseline: gait with hemiwalker to be assessed Goal status: INITIAL  4.  Increase gait velocity to >/= 2.4 ft/sec with RW for increased gait efficiency. Baseline: 15.94 secs = 2.06 ft/sec Goal status: INITIAL  5.  Improve TUG score to </= 17 secs with RW with pt demonstrating correct hand placement with sit to/from stand transfers and safe speed, I.e. not rushing. Baseline: 18.97 secs with RW but not safe Goal status: INITIAL  6.  Independent with HEP for balance and strengthening exercises. Baseline:  Goal status: INITIAL  LONG TERM GOALS: Target date: 12-04-22 (8 weeks)  Pt will perform sit to stand transfer without UE support from standard chair and will stabilize upon initial standing without bracing with legs. Baseline:  bil. UE support needed from mat table Goal status: INITIAL   2.  Pt will amb. 350' with quad cane with supervision on flat, even surface for increased community accessibility. Baseline: pt using RW- modified independent household, SBA community amb. Goal status: INITIAL  3.  Improve Berg balance test score to >/= 36/56 to demo improved standing balance. Baseline: 26/56 Goal status: INITIAL  4.   Increase gait velocity to >/= 3.0 ft/sec with RW for increased gait efficiency. Baseline: 15.94 secs = 2.06 ft/sec Goal status: INITIAL  5.  Improve TUG score to </= 15 secs with hemiwalker (or quad cane) with pt demonstrating correct hand placement with sit to/from stand transfers and safe speed, I.e. not rushing. Baseline: 18.97 secs with RW but not safe Goal status: INITIAL  6.  Transfer floor to stand with UE support on sturdy object or furniture with supervision. Baseline: TBA Goal status: INITIAL  ASSESSMENT:  CLINICAL IMPRESSION:  PT session focused on LLE strengthening, core  stabilization, and core strengthening.  Pt able to perform Lt clam shell exercise in Rt sidelying position but unable to abduct LLE in this position due to weakness.  Pt reported fatigue and low back tightness at end of session - tightness was relieved with use of physioball for stretching by rolling ball forward; pt reported increased tightness in Rt lateral low back compared to Lt lateral low back.  Continue POC.  OBJECTIVE IMPAIRMENTS: Abnormal gait, decreased activity tolerance, decreased balance, decreased coordination, decreased knowledge of use of DME, decreased strength, decreased safety awareness, and impaired sensation.   ACTIVITY LIMITATIONS: carrying, lifting, bending, standing, squatting, stairs, transfers, locomotion level, and driving  PARTICIPATION LIMITATIONS: meal prep, cleaning, laundry, driving, shopping, community activity, and yard work  PERSONAL FACTORS: Past/current experiences and 1 comorbidity: h/o cervical tumors with s/p cervical fusion  are also affecting patient's functional outcome.   REHAB POTENTIAL: Good  CLINICAL DECISION MAKING: Evolving/moderate complexity  EVALUATION COMPLEXITY: Moderate  PLAN:  PT FREQUENCY:  3x/week for 4 wks followed by 2x/wk for 4 wks  PT DURATION: 8 weeks  PLANNED INTERVENTIONS: Therapeutic exercises, Therapeutic activity, Neuromuscular re-education, Balance training, Gait training, Patient/Family education, Self Care, Stair training, Orthotic/Fit training, DME instructions, and Aquatic Therapy  PLAN FOR NEXT SESSION:  Please start checking STG's (due 11-06-22) work on Investment banker, operational with [employer]:  any LLE strengthening exercises - closed chain, tall kneeling, etc.:  standing balance exercises/activities; functional strengthening (sit to stands, squats), quadruped? Prone? Ramps and curbs w/ cane,  core strengthening, low back stretches   Consider brief Bioness trial again with use of thigh cuff on hamstring and could retry ankle, if  response still minimal recommend discontinuing bioness trial at this time and work on other interventions    Kerry Fort, PT [facility] [address] [place], [place] [address] [phone]  11/03/2022, 8:16 PM

## 2022-11-04 ENCOUNTER — Ambulatory Visit: Payer: Managed Care, Other (non HMO) | Admitting: Physical Therapy

## 2022-11-04 DIAGNOSIS — R2681 Unsteadiness on feet: Secondary | ICD-10-CM

## 2022-11-04 DIAGNOSIS — M6281 Muscle weakness (generalized): Secondary | ICD-10-CM

## 2022-11-04 DIAGNOSIS — R293 Abnormal posture: Secondary | ICD-10-CM

## 2022-11-04 DIAGNOSIS — R278 Other lack of coordination: Secondary | ICD-10-CM

## 2022-11-04 DIAGNOSIS — R2689 Other abnormalities of gait and mobility: Secondary | ICD-10-CM | POA: Diagnosis not present

## 2022-11-04 NOTE — Therapy (Addendum)
OUTPATIENT PHYSICAL THERAPY NEURO TREATMENT- 10th Visit PROGRESS NOTE   Patient Name: Larry Riley MRN: [id number] DOB:[birth year], 42 y.o., male Today's Date: 11/04/2022   Physical Therapy Progress Note   Dates of Reporting Period: 10/06/2022 - 11/04/2022  See Note below for Objective Data and Assessment of Progress/Goals.  Thank you for the referral of this patient. Peter Congo, PT, DPT, CSRS    PCP: Corwin Levins., MD REFERRING PROVIDER: Juliann Pares, FNP  END OF SESSION:  PT End of Session - 11/04/22 1446     Visit Number 11    Number of Visits 21    Date for PT Re-Evaluation 12/04/22    Authorization Type [employer]    Authorization - Number of Visits 60    PT Start Time 1446    PT Stop Time 1530    PT Time Calculation (min) 44 min    Activity Tolerance Patient tolerated treatment well    Behavior During Therapy [facility] for tasks assessed/performed                  Past Medical History:  Diagnosis Date   Anxiety    Depression    GERD (gastroesophageal reflux disease) 01/21/2017   MRSA (methicillin resistant Staphylococcus aureus) 2005   leg   Neurofibromatosis    tumor down spine and brain   Past Surgical History:  Procedure Laterality Date   hydrocephalus     shunt 1999-[place]   NECK SURGERY      tumor removal   SPINE SURGERY  10/2013   c2-t2 fusion  , laminectomy c1-6-- Dr Raynald Kemp-- [place]   VASECTOMY  02/2013   Patient Active Problem List   Diagnosis Date Noted   Skin lesion 10/24/2022   Urinary frequency 10/21/2022   Peripheral edema 08/17/2022   Hypotension 08/17/2022   Urinary incontinence 07/04/2022   Neurofibroma 04/23/2022   Neoplasm of unspecified behavior of bone, soft tissue, and skin 02/16/2022   Acute hearing loss, right 08/04/2021   Hyperglycemia 08/04/2021   Abscess of right foot 08/16/2020   Cellulitis of right foot 08/16/2020   Microhematuria 08/03/2020   Gastroesophageal reflux disease 07/07/2019   External  otitis of right ear 03/09/2017   Thumb laceration, right, initial encounter 03/09/2017   Weakness of left side of body 11/19/2016   Stress due to illness of family member 06/10/2016   Syncope 04/23/2016   Onychomycosis 04/23/2016   Impacted ear wax 04/23/2016   Constipation due to opioid therapy 06/13/2014   Chronic pain syndrome 02/21/2014   Allergic reaction 02/20/2014   Neurofibromatosis, type 1 (HCC) 12/14/2013   Status post cervical arthrodesis 11/21/2013   Cervical myelopathy (HCC) 09/21/2013   Encounter for well adult exam with abnormal findings 06/23/2010   Disorder of autonomic nervous system 04/12/2009   Other psoriasis 08/24/2007   Tinea pedis 08/24/2007    ONSET DATE: 04-23-22  REFERRING DIAG:  Diagnosis  D49.2 (ICD-10-CM) - Neoplasm of unspecified behavior of bone, soft tissue, and skin    THERAPY DIAG:  Muscle weakness (generalized)  Other abnormalities of gait and mobility  Unsteadiness on feet  Abnormal posture  Other lack of coordination  Rationale for Evaluation and Treatment: Rehabilitation  SUBJECTIVE STATEMENT:  "Larry Riley"   Pt rolled R ankle "a little bit" in lobby on way in "my left one stopped and got stuck first". No falls.   Pt indicates he would like to work on core strengthening and stretching today and that he eventually wants to be able to do leg press etc.   Pt accompanied by:  spouse, [name]  PERTINENT HISTORY: s/p T11-L1 Laminectomies for intradural tumor resections & L3-L5 laminectomies for intradural tumor resections (hospitalization 04-23-22 - 04-29-22;  Neurofibromatosis 1 diagnosed in 1995  History of foot surgery 08/2020  Patient had metal removed from R foot and infected tissue removed  Neurofibromatosis (CMS/HCC)  Neurofibromatosis, type 1 (von  Recklinghausen's disease) (CMS/HCC)  Status post cervical arthrodesis 11/21/2013 - cervical fusion C2-T2, laminectomy C1- C6  PAIN:  Are you having pain? Yes: NPRS scale: 7/10 Pain location: low back "stiff" Aggravating factors: sitting in recliner chair Relieving factors: Gabapentin, some of gastro-problems seem to be improving and pain improves with this Pain was 7-8/10 prior to surgery (on average day) - pt reports pain is much improved since surgery  PRECAUTIONS: Fall  RED FLAGS: Bowel or bladder incontinence: Yes: Improving - pt states he has mostly bladder incontinence at night; little control over starting/stopping  WEIGHT BEARING RESTRICTIONS: No  FALLS: Has patient fallen in last 6 months? Yes. Number of falls 2  LIVING ENVIRONMENT: Lives with: lives with their spouse Lives in: House/apartment Stairs: Yes: External: 2 steps; can reach both Has following equipment at home: Dan Humphreys - 2 wheeled, Wheelchair (manual), Tour manager, and hemiwalker  PLOF: Independent with basic ADLs, Independent with household mobility without device, Independent with community mobility without device, and Independent with transfers; pt drove prior to surgery on 04-23-22 (has not driven since)  PATIENT GOALS: "walk without use of device, as I was prior to surgery"; work on balance  OBJECTIVE:   DIAGNOSTIC FINDINGS: N/A  COGNITION: Overall cognitive status: Within functional limits for tasks assessed   SENSATION: Impaired in bil. LE's   COORDINATION: Decreased in bil. LE's wi  POSTURE: forward head, decreased thoracic kyphosis, and head is laterally flexed to Lt side  LOWER EXTREMITY ROM:   WFL's except for Lt ankle ROM - pt has worn AFO for > 4 yrs Decreased Rt active dorsiflexion and plantarflexion due to weakness  LOWER EXTREMITY MMT:    MMT Right Eval Left Eval  Hip flexion 5 5  Hip extension    Hip abduction    Hip adduction    Hip internal rotation    Hip external  rotation    Knee flexion 4+ (seated position) 4 (seated position)  Knee extension 5 5  Ankle dorsiflexion 3- 0-1  Ankle plantarflexion    Ankle inversion    Ankle eversion    (Blank rows = not tested)  BED MOBILITY:  Modified independent   TRANSFERS: Assistive device utilized: Environmental consultant - 2 wheeled  Sit to stand: Modified independence short distances; SBA community amb. With RW Stand to sit: Modified independence  CURB: TBA  STAIRS:  TBA  GAIT: Gait pattern: step through pattern, decreased ankle dorsiflexion- Right, decreased ankle dorsiflexion- Left, genu recurvatum- Left, and trunk rotated posterior- Left Distance walked: 65' Assistive device utilized: Environmental consultant - 2 wheeled Level of assistance: SBA Comments: AFO worn on LLE - carbon fiber with an anterior guard added (appears to be custom fabricated - pt states he obtained this brace approx. 4 yrs ago)  FUNCTIONAL TESTS:  Timed up and go (TUG): 18.97 secs  with RW but pt was not safe - pt was instructed to ambulate at his normal speed but ambulated at fast pace and was unsteady with turns so score is not totally accurate - CGA was provided for safety 10 meter walk test: 15.94 secs with RW = 2.06 ft/sec Berg Balance Scale: 26/56 3-4   PATIENT SURVEYS:  N/A for his diagnosis - s/p laminectomies for tumor resections  TODAY'S TREATMENT:  Exercise ball roll outs 3 x 15 sec hold Exercise ball roll outs + L and R bias, 3 x 15 sec hold  TherAct: STG assessment: Gait speed: 32.8 ft/ 15.06 sec = 2.18 ft/sec  TUG: 16.25 sec w/ RW and close SBA Assessed sit to stand form: pt uses only RUE minimally to help boost to stand, no bracing of BLEs, pt is slightly unsteady at top of standing but safe.  NMR: Tall kneel w/ bench in front for support and airex under knees for comfort Isometric hold against push-pull w/ 2 lb weighted stick 2 sets x5 push, x5 pull Pt reports challenging but tolerable Ball tap w/ 2lb weighted pole held w/ BUE 3  sets x15 taps Palloff press 2 sets x8 bilaterally w/ green band resistance (min pull) Verbal and tactile cues to improve pelvic positioning and core activation --> increased LBP, adjusted position again to more tolerable Wide-grip lift 2lb weighted pole over head w/ bilateral elbows extended x8 Pt requires min to mod A overall for tall-kneeling tasks, most assist needed with isometric push/pull   PATIENT EDUCATION:  Education details: safe set up for HEP, outcome measures Person educated: Patient and Spouse Education method: Explanation Education comprehension: verbalized understanding  HOME EXERCISE PROGRAM:  Access Code: [email or web address] URL: [email or web address] Date: 11/03/2022 Prepared by: Maebelle Munroe  Exercises - Prone Knee Flexion AROM  - 1 x daily - 7 x weekly - 1 sets - 10 reps - 3 sec hold - Beginner Clam  - 1 x daily - 7 x weekly - 1 sets - 10 reps - Hooklying Isometric Clamshell  - 1 x daily - 7 x weekly - 1 sets - 10 reps - 3 sec hold - SEATED SIDE PLANK - LEFT SIDE  - 1 x daily - 7 x weekly - 3 sets - 10 reps   Access Code: [email or web address] URL: [email or web address] Date: 10/13/2022 Prepared by: Maryruth Eve  Exercises - Sit to Stand  - 1 x daily - 7 x weekly - 2-3 sets - 10 reps - Sit to Stand with Resistance Around Legs  - 1 x daily - 7 x weekly - 2-3 sets - 5 reps - Standing March with Unilateral Counter Support  - 1 x daily - 7 x weekly - 3 sets - 20 reps - Standing 3-Way Leg Reach with Resistance at Ankles and Counter Support  - 1 x daily - 7 x weekly - 2-3 sets - 10 reps  Walking with hemiwalker with gait belt and spouse supervision ~1x day  Med[employer] [email or web address]  GOALS: Goals reviewed with patient? Yes  SHORT TERM GOALS: Target date: 11-06-22 (4 weeks)   Pt will perform sit to stand transfer with 1 UE support only from standard chair and will stabilize upon initial standing without bracing with legs. Baseline:  bil. UE support  needed from mat table, RUE support only no bracing w/ Les (11/04/22) Goal status: MET  2.  Improve Berg balance test score to >/= 31/56 to demo improved standing balance. Baseline: 26/56 Goal status: INITIAL  3.  Pt  will amb. 115' with hemiwalker with CGA on flat, even surface. Baseline: gait with hemiwalker to be assessed Goal status: INITIAL  4.  Increase gait velocity to >/= 2.4 ft/sec with RW for increased gait efficiency. Baseline: 15.94 secs = 2.06 ft/sec; 15.06 sec = 2.18 ft/sec (11/04/22) Goal status: PROGRESSING  5.  Improve TUG score to </= 17 secs with RW with pt demonstrating correct hand placement with sit to/from stand transfers and safe speed, I.e. not rushing. Baseline: 18.97 secs with RW but not safe, 16.25 sec w/ RW and close SBA (11/04/22) Goal status: MET  6.  Independent with HEP for balance and strengthening exercises. Baseline:  Goal status: INITIAL  LONG TERM GOALS: Target date: 12-04-22 (8 weeks)  Pt will perform sit to stand transfer without UE support from standard chair and will stabilize upon initial standing without bracing with legs. Baseline:  bil. UE support needed from mat table; RUE support only no bracing w/ Les (11/04/22) Goal status: INITIAL   2.  Pt will amb. 350' with quad cane with supervision on flat, even surface for increased community accessibility. Baseline: pt using RW- modified independent household, SBA community amb. Goal status: INITIAL  3.  Improve Berg balance test score to >/= 36/56 to demo improved standing balance. Baseline: 26/56 Goal status: INITIAL  4.   Increase gait velocity to >/= 3.0 ft/sec with RW for increased gait efficiency. Baseline: 15.94 secs = 2.06 ft/sec; 15.06 sec = 2.18 ft/sec (11/04/22) Goal status: INITIAL  5.  Improve TUG score to </= 15 secs with hemiwalker (or quad cane) with pt demonstrating correct hand placement with sit to/from stand transfers and safe speed, I.e. not rushing. Baseline: 18.97  secs with RW but not safe Goal status: INITIAL  6.  Transfer floor to stand with UE support on sturdy object or furniture with supervision. Baseline: TBA Goal status: INITIAL  ASSESSMENT:  CLINICAL IMPRESSION:  PT session focused on beginning to assess STGs, stretching low back, and core strengthening and balance training in tall kneeling. Pt has met 2/3 STGs and is progressing on 1/3 goals assessed this date. Pt's sit to stand transfer now only requires 1 UE support w/ minimal push through arm and no bracing of BLEs to stand. Pt's gait speed w/ RW improved to 2.16 ft/sec and TUG improved to 16.25 sec. Pt continues to be limited by low back pain, tolerated core and balance activities in tall kneeling well w/ occasional complaint of increased LBP. Continue POC.   OBJECTIVE IMPAIRMENTS: Abnormal gait, decreased activity tolerance, decreased balance, decreased coordination, decreased knowledge of use of DME, decreased strength, decreased safety awareness, and impaired sensation.   ACTIVITY LIMITATIONS: carrying, lifting, bending, standing, squatting, stairs, transfers, locomotion level, and driving  PARTICIPATION LIMITATIONS: meal prep, cleaning, laundry, driving, shopping, community activity, and yard work  PERSONAL FACTORS: Past/current experiences and 1 comorbidity: h/o cervical tumors with s/p cervical fusion  are also affecting patient's functional outcome.   REHAB POTENTIAL: Good  CLINICAL DECISION MAKING: Evolving/moderate complexity  EVALUATION COMPLEXITY: Moderate  PLAN:  PT FREQUENCY:  3x/week for 4 wks followed by 2x/wk for 4 wks  PT DURATION: 8 weeks  PLANNED INTERVENTIONS: Therapeutic exercises, Therapeutic activity, Neuromuscular re-education, Balance training, Gait training, Patient/Family education, Self Care, Stair training, Orthotic/Fit training, DME instructions, and Aquatic Therapy  PLAN FOR NEXT SESSION: check remaining  STGs (due 11-06-22)  work on Investment banker, operational  with [employer]:  any LLE strengthening exercises - closed chain, tall kneeling, etc.:  standing balance exercises/activities;  functional strengthening (sit to stands, squats), quadruped, prone, Ramps and curbs w/ cane, core strengthening, low back stretches, balance seated on exercise ball, leg press, standing/half kneel core?   Consider brief Bioness trial again with use of thigh cuff on hamstring and could retry ankle, if response still minimal recommend discontinuing bioness trial at this time and work on other interventions     Beverely Low, SPT Peter Congo, PT, DPT, CSRS    [facility] [address] [place], [place] [address] [phone]  11/04/2022, 3:51 PM

## 2022-11-05 ENCOUNTER — Ambulatory Visit: Payer: Managed Care, Other (non HMO) | Admitting: Physical Therapy

## 2022-11-05 DIAGNOSIS — R2681 Unsteadiness on feet: Secondary | ICD-10-CM

## 2022-11-05 DIAGNOSIS — R2689 Other abnormalities of gait and mobility: Secondary | ICD-10-CM

## 2022-11-05 DIAGNOSIS — M6281 Muscle weakness (generalized): Secondary | ICD-10-CM

## 2022-11-05 NOTE — Therapy (Signed)
OUTPATIENT PHYSICAL THERAPY NEURO TREATMENT   Patient Name: Larry Riley MRN: [id number] DOB:02/06/1980, 42 y.o., male Today's Date: 11/06/2022   PCP: Corwin Levins., MD REFERRING PROVIDER: Juliann Pares, FNP  END OF SESSION:  PT End of Session - 11/06/22 1118     Visit Number 12    Number of Visits 21    Date for PT Re-Evaluation 12/04/22    Authorization Type [employer]    Authorization - Number of Visits 60    PT Start Time 1448    PT Stop Time 1532    PT Time Calculation (min) 44 min    Equipment Utilized During Treatment Gait belt    Activity Tolerance Patient tolerated treatment well    Behavior During Therapy WFL for tasks assessed/performed                   Past Medical History:  Diagnosis Date   Anxiety    Depression    GERD (gastroesophageal reflux disease) 01/21/2017   MRSA (methicillin resistant Staphylococcus aureus) 2005   leg   Neurofibromatosis    tumor down spine and brain   Past Surgical History:  Procedure Laterality Date   hydrocephalus     shunt 1999-[place]   NECK SURGERY      tumor removal   SPINE SURGERY  10/2013   c2-t2 fusion  , laminectomy c1-6-- Dr Raynald Kemp-- [place]   VASECTOMY  02/2013   Patient Active Problem List   Diagnosis Date Noted   Skin lesion 10/24/2022   Urinary frequency 10/21/2022   Peripheral edema 08/17/2022   Hypotension 08/17/2022   Urinary incontinence 07/04/2022   Neurofibroma 04/23/2022   Neoplasm of unspecified behavior of bone, soft tissue, and skin 02/16/2022   Acute hearing loss, right 08/04/2021   Hyperglycemia 08/04/2021   Abscess of right foot 08/16/2020   Cellulitis of right foot 08/16/2020   Microhematuria 08/03/2020   Gastroesophageal reflux disease 07/07/2019   External otitis of right ear 03/09/2017   Thumb laceration, right, initial encounter 03/09/2017   Weakness of left side of body 11/19/2016   Stress due to illness of family member 06/10/2016   Syncope 04/23/2016    Onychomycosis 04/23/2016   Impacted ear wax 04/23/2016   Constipation due to opioid therapy 06/13/2014   Chronic pain syndrome 02/21/2014   Allergic reaction 02/20/2014   Neurofibromatosis, type 1 (HCC) 12/14/2013   Status post cervical arthrodesis 11/21/2013   Cervical myelopathy (HCC) 09/21/2013   Encounter for well adult exam with abnormal findings 06/23/2010   Disorder of autonomic nervous system 04/12/2009   Other psoriasis 08/24/2007   Tinea pedis 08/24/2007    ONSET DATE: 04-23-22  REFERRING DIAG:  Diagnosis  D49.2 (ICD-10-CM) - Neoplasm of unspecified behavior of bone, soft tissue, and skin    THERAPY DIAG:  Muscle weakness (generalized)  Other abnormalities of gait and mobility  Unsteadiness on feet  Rationale for Evaluation and Treatment: Rehabilitation  SUBJECTIVE STATEMENT:  "Larry Riley"     Pt reports he has been very tired after having PT for past 2 days consecutively - but feels good.  Continues to do exercises at home  Pt accompanied by:  spouse, [name]  PERTINENT HISTORY: s/p T11-L1 Laminectomies for intradural tumor resections & L3-L5 laminectomies for intradural tumor resections (hospitalization 04-23-22 - 04-29-22;  Neurofibromatosis 1 diagnosed in 1995  History of foot surgery 08/2020  Patient had metal removed from R foot and infected tissue removed  Neurofibromatosis (CMS/HCC)  Neurofibromatosis, type 1 (von Recklinghausen's disease) (CMS/HCC)  Status post cervical arthrodesis 11/21/2013 - cervical fusion C2-T2, laminectomy C1- C6  PAIN:  Are you having pain? Yes: NPRS scale: 7/10 Pain location: low back "stiff" Aggravating factors: sitting in recliner chair Relieving factors: Gabapentin, some of gastro-problems seem to be improving and pain improves with this Pain was  7-8/10 prior to surgery (on average day) - pt reports pain is much improved since surgery  PRECAUTIONS: Fall  RED FLAGS: Bowel or bladder incontinence: Yes: Improving - pt states he has mostly bladder incontinence at night; little control over starting/stopping  WEIGHT BEARING RESTRICTIONS: No  FALLS: Has patient fallen in last 6 months? Yes. Number of falls 2  LIVING ENVIRONMENT: Lives with: lives with their spouse Lives in: House/apartment Stairs: Yes: External: 2 steps; can reach both Has following equipment at home: Dan Humphreys - 2 wheeled, Wheelchair (manual), Tour manager, and hemiwalker  PLOF: Independent with basic ADLs, Independent with household mobility without device, Independent with community mobility without device, and Independent with transfers; pt drove prior to surgery on 04-23-22 (has not driven since)  PATIENT GOALS: "walk without use of device, as I was prior to surgery"; work on balance  OBJECTIVE:   DIAGNOSTIC FINDINGS: N/A  COGNITION: Overall cognitive status: Within functional limits for tasks assessed   SENSATION: Impaired in bil. LE's   COORDINATION: Decreased in bil. LE's wi  POSTURE: forward head, decreased thoracic kyphosis, and head is laterally flexed to Lt side  LOWER EXTREMITY ROM:   WFL's except for Lt ankle ROM - pt has worn AFO for > 4 yrs Decreased Rt active dorsiflexion and plantarflexion due to weakness  LOWER EXTREMITY MMT:    MMT Right Eval Left Eval  Hip flexion 5 5  Hip extension    Hip abduction    Hip adduction    Hip internal rotation    Hip external rotation    Knee flexion 4+ (seated position) 4 (seated position)  Knee extension 5 5  Ankle dorsiflexion 3- 0-1  Ankle plantarflexion    Ankle inversion    Ankle eversion    (Blank rows = not tested)  BED MOBILITY:  Modified independent   TRANSFERS: Assistive device utilized: Environmental consultant - 2 wheeled  Sit to stand: Modified independence short distances; SBA community amb.  With RW Stand to sit: Modified independence  CURB: TBA  STAIRS:  TBA  GAIT: Gait pattern: step through pattern, decreased ankle dorsiflexion- Right, decreased ankle dorsiflexion- Left, genu recurvatum- Left, and trunk rotated posterior- Left Distance walked: 58' Assistive device utilized: Environmental consultant - 2 wheeled Level of assistance: SBA Comments: AFO worn on LLE - carbon fiber with an anterior guard added (appears to be custom fabricated - pt states he obtained this brace approx. 4 yrs ago)  FUNCTIONAL TESTS:  Timed up and go (TUG): 18.97 secs with RW but pt was not safe - pt was instructed to ambulate at his normal speed but ambulated at fast pace and  was unsteady with turns so score is not totally accurate - CGA was provided for safety 10 meter walk test: 15.94 secs with RW = 2.06 ft/sec Berg Balance Scale: 26/56 3-4   PATIENT SURVEYS:  N/A for his diagnosis - s/p laminectomies for tumor resections  TODAY'S TREATMENT:  11-05-22  Gait:   Gait pattern: step through pattern, decreased ankle dorsiflexion- Right, decreased ankle dorsiflexion- Left, genu recurvatum- Left, and trunk rotated posterior- Left Distance walked: 115' Assistive device utilized: [employer] (pt's own) Level of assistance: CGA to min assist due to hitting object (rack holding balls) on Rt side with SBQC after approx. 90' amb.  Comments: AFO worn on LLE - carbon fiber with an anterior guard added (appears to be custom fabricated - pt states he obtained this brace approx. 4 yrs ago)   Neuro Re-ed:   11/05/22 0001  Berg Balance Test  Sit to Stand 3  Standing Unsupported 3  Sitting with Back Unsupported but Feet Supported on Floor or Stool 4  Stand to Sit 4  Transfers 3  Standing Unsupported with Eyes Closed 2 (6.5, 9.54)  Standing Unsupported with Feet Together 1  From Standing, Reach Forward with Outstretched Arm 3  From Standing Position, Pick up Object from Floor 3  From Standing Position, Turn to Look Behind  Over each Shoulder 1  Turn 360 Degrees 1  Standing Unsupported, Alternately Place Feet on Step/Stool 1  Standing Unsupported, One Foot in Front 2  Standing on One Leg 1  Total Score 32   Airex cushion placed on mat for increased height for increased ease with sit to stand - pt performed 3 reps sit to stand transfers without UE support without bracing LE's against mat table with SBA to CGA  PATIENT EDUCATION:  Education details: safe set up for HEP, outcome measures Person educated: Patient and Spouse Education method: Explanation Education comprehension: verbalized understanding  HOME EXERCISE PROGRAM:  Access Code: [email or web address] URL: [email or web address] Date: 11/03/2022 Prepared by: Maebelle Munroe  Exercises - Prone Knee Flexion AROM  - 1 x daily - 7 x weekly - 1 sets - 10 reps - 3 sec hold - Beginner Clam  - 1 x daily - 7 x weekly - 1 sets - 10 reps - Hooklying Isometric Clamshell  - 1 x daily - 7 x weekly - 1 sets - 10 reps - 3 sec hold - SEATED SIDE PLANK - LEFT SIDE  - 1 x daily - 7 x weekly - 3 sets - 10 reps   Access Code: [email or web address] URL: [email or web address] Date: 10/13/2022 Prepared by: Maryruth Eve  Exercises - Sit to Stand  - 1 x daily - 7 x weekly - 2-3 sets - 10 reps - Sit to Stand with Resistance Around Legs  - 1 x daily - 7 x weekly - 2-3 sets - 5 reps - Standing March with Unilateral Counter Support  - 1 x daily - 7 x weekly - 3 sets - 20 reps - Standing 3-Way Leg Reach with Resistance at Ankles and Counter Support  - 1 x daily - 7 x weekly - 2-3 sets - 10 reps  Walking with hemiwalker with gait belt and spouse supervision ~1x day  [employer] [email or web address]  GOALS: Goals reviewed with patient? Yes  SHORT TERM GOALS: Target date: 11-06-22 (4 weeks)   Pt will perform sit to stand transfer with 1 UE support only from standard chair and will stabilize upon initial standing without bracing with legs. Baseline:  bil. UE support  needed  from mat table, RUE support only no bracing w/ Les (11/04/22) Goal status: MET  2.  Improve Berg balance test score to >/= 31/56 to demo improved standing balance. Baseline: 26/56;  score 32/56 on 11-05-22 Goal status: GOAL MET  3.  Pt will amb. 115' with hemiwalker with CGA on flat, even surface. Baseline: gait with hemiwalker to be assessed; SBQC with CGA to min assist due to hitting object on Rt side with SBQC when amb. Around track Goal status: Met with hemiwalker; Goal partially met with [facility] -- 11-05-22  4.  Increase gait velocity to >/= 2.4 ft/sec with RW for increased gait efficiency. Baseline: 15.94 secs = 2.06 ft/sec; 15.06 sec = 2.18 ft/sec (11/04/22) Goal status: PROGRESSING  5.  Improve TUG score to </= 17 secs with RW with pt demonstrating correct hand placement with sit to/from stand transfers and safe speed, I.e. not rushing. Baseline: 18.97 secs with RW but not safe, 16.25 sec w/ RW and close SBA (11/04/22) Goal status: MET  6.  Independent with HEP for balance and strengthening exercises. Baseline:  Goal status: Goal met 11-05-22  LONG TERM GOALS: Target date: 12-04-22 (8 weeks)  Pt will perform sit to stand transfer without UE support from standard chair and will stabilize upon initial standing without bracing with legs. Baseline:  bil. UE support needed from mat table; RUE support only no bracing w/ Les (11/04/22) Goal status: INITIAL   2.  Pt will amb. 350' with quad cane with supervision on flat, even surface for increased community accessibility. Baseline: pt using RW- modified independent household, SBA community amb. Goal status: INITIAL  3.  Improve Berg balance test score to >/= 36/56 to demo improved standing balance. Baseline: 26/56 Goal status: INITIAL  4.   Increase gait velocity to >/= 3.0 ft/sec with RW for increased gait efficiency. Baseline: 15.94 secs = 2.06 ft/sec; 15.06 sec = 2.18 ft/sec (11/04/22) Goal status: INITIAL  5.  Improve TUG  score to </= 15 secs with hemiwalker (or quad cane) with pt demonstrating correct hand placement with sit to/from stand transfers and safe speed, I.e. not rushing. Baseline: 18.97 secs with RW but not safe Goal status: INITIAL  6.  Transfer floor to stand with UE support on sturdy object or furniture with supervision. Baseline: TBA Goal status: INITIAL  ASSESSMENT:  CLINICAL IMPRESSION:  PT session focused on assessing remaining STG's;  pt's Berg score has improved from 26/56 to 32/56.  Pt able to amb. 115' with SBQC with CGA to min assist due to pt hitting object on Rt side with cane after amb. Approx. 90'.  Pt continues to have unsteadiness with sit to stand transfers without UE support and often does stabilize with back of legs against mat table for assist with balance recovery.  Pt has met STG's #1 & 5 as assessed on 11-04-22 with STG #4 partially met as gait velocity has increased from 2.06 ft/sec to 2.18 ft/sec with use of RW.  Pt has met STG's #2 and 6 as assessed in today's session, with STG #3 met with use of hemiwalker and partially met with use of SBQC.  Pt's balance fluctuates depending on amount of fatigue.  Cont with POC.   OBJECTIVE IMPAIRMENTS: Abnormal gait, decreased activity tolerance, decreased balance, decreased coordination, decreased knowledge of use of DME, decreased strength, decreased safety awareness, and impaired sensation.   ACTIVITY LIMITATIONS: carrying, lifting, bending, standing, squatting, stairs, transfers, locomotion level, and driving  PARTICIPATION LIMITATIONS: meal prep, cleaning,  laundry, driving, shopping, community activity, and yard work  PERSONAL FACTORS: Past/current experiences and 1 comorbidity: h/o cervical tumors with s/p cervical fusion  are also affecting patient's functional outcome.   REHAB POTENTIAL: Good  CLINICAL DECISION MAKING: Evolving/moderate complexity  EVALUATION COMPLEXITY: Moderate  PLAN:  PT FREQUENCY:  3x/week for 4 wks  followed by 2x/wk for 4 wks  PT DURATION: 8 weeks  PLANNED INTERVENTIONS: Therapeutic exercises, Therapeutic activity, Neuromuscular re-education, Balance training, Gait training, Patient/Family education, Self Care, Stair training, Orthotic/Fit training, DME instructions, and Aquatic Therapy  PLAN FOR NEXT SESSION:   work on Investment banker, operational with SBQC:  any LLE strengthening exercises - closed chain, tall kneeling, etc.:  standing balance exercises/activities; functional strengthening (sit to stands, squats), quadruped, prone, Ramps and curbs w/ cane, core strengthening, low back stretches, balance seated on exercise ball, leg press, standing/half kneel core?   Consider brief Bioness trial again with use of thigh cuff on hamstring and could retry ankle, if response still minimal recommend discontinuing bioness trial at this time and work on other interventions    Kerry Fort, PT [facility] [address] [place], [place] [address] [phone]  11/06/2022, 11:21 AM

## 2022-11-06 ENCOUNTER — Encounter: Payer: Self-pay | Admitting: Physical Therapy

## 2022-11-06 NOTE — Progress Notes (Signed)
   11/05/22 0001  Berg Balance Test  Sit to Stand 3  Standing Unsupported 3  Sitting with Back Unsupported but Feet Supported on Floor or Stool 4  Stand to Sit 4  Transfers 3  Standing Unsupported with Eyes Closed 2 (6.5, 9.54)  Standing Unsupported with Feet Together 1  From Standing, Reach Forward with Outstretched Arm 3  From Standing Position, Pick up Object from Floor 3  From Standing Position, Turn to Look Behind Over each Shoulder 1  Turn 360 Degrees 1  Standing Unsupported, Alternately Place Feet on Step/Stool 1  Standing Unsupported, One Foot in Front 2  Standing on One Leg 1  Total Score 32

## 2022-11-10 ENCOUNTER — Ambulatory Visit: Payer: Managed Care, Other (non HMO) | Admitting: Physical Therapy

## 2022-11-10 DIAGNOSIS — M6281 Muscle weakness (generalized): Secondary | ICD-10-CM

## 2022-11-10 DIAGNOSIS — R2681 Unsteadiness on feet: Secondary | ICD-10-CM

## 2022-11-10 DIAGNOSIS — R2689 Other abnormalities of gait and mobility: Secondary | ICD-10-CM

## 2022-11-10 NOTE — Therapy (Unsigned)
OUTPATIENT PHYSICAL THERAPY NEURO TREATMENT   Patient Name: Larry Riley MRN: [id number] DOB:[birth year], 42 y.o., male Today's Date: 11/11/2022   PCP: Corwin Levins., MD REFERRING PROVIDER: Juliann Pares, FNP  END OF SESSION:  PT End of Session - 11/11/22 1007     Visit Number 13    Number of Visits 21    Date for PT Re-Evaluation 12/04/22    Authorization Type [employer]    Authorization - Visit Number 13    Authorization - Number of Visits 60    PT Start Time 1450    PT Stop Time 1533    PT Time Calculation (min) 43 min    Equipment Utilized During Treatment Gait belt    Activity Tolerance Patient tolerated treatment well    Behavior During Therapy WFL for tasks assessed/performed                    Past Medical History:  Diagnosis Date   Anxiety    Depression    GERD (gastroesophageal reflux disease) 01/21/2017   MRSA (methicillin resistant Staphylococcus aureus) 2005   leg   Neurofibromatosis    tumor down spine and brain   Past Surgical History:  Procedure Laterality Date   hydrocephalus     shunt 1999-[place]   NECK SURGERY      tumor removal   SPINE SURGERY  10/2013   c2-t2 fusion  , laminectomy c1-6-- Dr Raynald Kemp-- [place]   VASECTOMY  02/2013   Patient Active Problem List   Diagnosis Date Noted   Skin lesion 10/24/2022   Urinary frequency 10/21/2022   Peripheral edema 08/17/2022   Hypotension 08/17/2022   Urinary incontinence 07/04/2022   Neurofibroma 04/23/2022   Neoplasm of unspecified behavior of bone, soft tissue, and skin 02/16/2022   Acute hearing loss, right 08/04/2021   Hyperglycemia 08/04/2021   Abscess of right foot 08/16/2020   Cellulitis of right foot 08/16/2020   Microhematuria 08/03/2020   Gastroesophageal reflux disease 07/07/2019   External otitis of right ear 03/09/2017   Thumb laceration, right, initial encounter 03/09/2017   Weakness of left side of body 11/19/2016   Stress due to illness of family member  06/10/2016   Syncope 04/23/2016   Onychomycosis 04/23/2016   Impacted ear wax 04/23/2016   Constipation due to opioid therapy 06/13/2014   Chronic pain syndrome 02/21/2014   Allergic reaction 02/20/2014   Neurofibromatosis, type 1 (HCC) 12/14/2013   Status post cervical arthrodesis 11/21/2013   Cervical myelopathy (HCC) 09/21/2013   Encounter for well adult exam with abnormal findings 06/23/2010   Disorder of autonomic nervous system 04/12/2009   Other psoriasis 08/24/2007   Tinea pedis 08/24/2007    ONSET DATE: 04-23-22  REFERRING DIAG:  Diagnosis  D49.2 (ICD-10-CM) - Neoplasm of unspecified behavior of bone, soft tissue, and skin    THERAPY DIAG:  Other abnormalities of gait and mobility  Muscle weakness (generalized)  Unsteadiness on feet  Rationale for Evaluation and Treatment: Rehabilitation  SUBJECTIVE STATEMENT:  "[name]"     Pt reports his low back feels tight today - may be partly stress related as he states job related stress as well as having car trouble this past weekend - "alot going on"; pt asks about the plastic skis for RW due to tennis balls wearing out so quickly  Pt accompanied by:  spouse, [name]  PERTINENT HISTORY: s/p T11-L1 Laminectomies for intradural tumor resections & L3-L5 laminectomies for intradural tumor resections (hospitalization 04-23-22 - 04-29-22;  Neurofibromatosis 1 diagnosed in 1995  History of foot surgery 08/2020  Patient had metal removed from R foot and infected tissue removed  Neurofibromatosis (CMS/HCC)  Neurofibromatosis, type 1 (von Recklinghausen's disease) (CMS/HCC)  Status post cervical arthrodesis 11/21/2013 - cervical fusion C2-T2, laminectomy C1- C6  PAIN:  Are you having pain? Yes: NPRS scale: 6/10 Pain location: low back  "stiff" Aggravating factors: sitting in recliner chair Relieving factors: Gabapentin, some of gastro-problems seem to be improving and pain improves with this Pain was 7-8/10 prior to surgery (on average day) - pt reports pain is much improved since surgery  PRECAUTIONS: Fall  RED FLAGS: Bowel or bladder incontinence: Yes: Improving - pt states he has mostly bladder incontinence at night; little control over starting/stopping  WEIGHT BEARING RESTRICTIONS: No  FALLS: Has patient fallen in last 6 months? Yes. Number of falls 2  LIVING ENVIRONMENT: Lives with: lives with their spouse Lives in: House/apartment Stairs: Yes: External: 2 steps; can reach both Has following equipment at home: Dan Humphreys - 2 wheeled, Wheelchair (manual), Tour manager, and hemiwalker  PLOF: Independent with basic ADLs, Independent with household mobility without device, Independent with community mobility without device, and Independent with transfers; pt drove prior to surgery on 04-23-22 (has not driven since)  PATIENT GOALS: "walk without use of device, as I was prior to surgery"; work on balance  OBJECTIVE:   DIAGNOSTIC FINDINGS: N/A  COGNITION: Overall cognitive status: Within functional limits for tasks assessed   SENSATION: Impaired in bil. LE's   COORDINATION: Decreased in bil. LE's wi  POSTURE: forward head, decreased thoracic kyphosis, and head is laterally flexed to Lt side  LOWER EXTREMITY ROM:   WFL's except for Lt ankle ROM - pt has worn AFO for > 4 yrs Decreased Rt active dorsiflexion and plantarflexion due to weakness  LOWER EXTREMITY MMT:    MMT Right Eval Left Eval  Hip flexion 5 5  Hip extension    Hip abduction    Hip adduction    Hip internal rotation    Hip external rotation    Knee flexion 4+ (seated position) 4 (seated position)  Knee extension 5 5  Ankle dorsiflexion 3- 0-1  Ankle plantarflexion    Ankle inversion    Ankle eversion    (Blank rows = not  tested)  BED MOBILITY:  Modified independent   TRANSFERS: Assistive device utilized: Environmental consultant - 2 wheeled  Sit to stand: Modified independence short distances; SBA community amb. With RW Stand to sit: Modified independence  CURB: TBA  STAIRS:  TBA  GAIT: Gait pattern: step through pattern, decreased ankle dorsiflexion- Right, decreased ankle dorsiflexion- Left, genu recurvatum- Left, and trunk rotated posterior- Left Distance walked: 93' Assistive device utilized: Environmental consultant - 2 wheeled Level of assistance: SBA Comments: AFO worn on LLE - carbon fiber with an anterior guard added (appears to be custom fabricated - pt states he obtained this brace approx. 4 yrs ago)  FUNCTIONAL TESTS:  Timed up and go (TUG): 18.97 secs  with RW but pt was not safe - pt was instructed to ambulate at his normal speed but ambulated at fast pace and was unsteady with turns so score is not totally accurate - CGA was provided for safety 10 meter walk test: 15.94 secs with RW = 2.06 ft/sec Berg Balance Scale: 26/56 3-4   PATIENT SURVEYS:  N/A for his diagnosis - s/p laminectomies for tumor resections  TODAY'S TREATMENT:  11-10-22  Gait:   Gait pattern: step through pattern, decreased ankle dorsiflexion- Right, decreased ankle dorsiflexion- Left, genu recurvatum- Left, and trunk rotated posterior- Left Distance walked: 115' x 1 rep around track Assistive device utilized: SBQC (pt's own); RW used for amb. In clinic from mat to steps and then to SciFit Level of assistance: CGA  Comments: AFO worn on LLE - carbon fiber with an anterior guard added (appears to be custom fabricated - pt states he obtained this brace approx. 4 yrs ago)  TherEx: Low back stretching with red physioball in front - rolling ball forward, forward & to Lt side, forward & to Rt side - 2 reps each direction with approx. 15 sec hold each  Bil. Hip and core strengthening exercises;  tall kneeling on mat with red ball in front for UE  support; pt performed mini squats 10 reps; unilateral UE shoulder flexion 3 reps each, bilateral shoulder flexion to 90 degrees 3 reps for cor stabilization;  trunk rotation 2 reps to each side without UE support with CGA to min assist  Quadruped position - pt performed 1 rep of hip extension - RLE and LLE - pt fatigued after 1 rep so exercise was discontinued  Pt sat on SitFit - performed contralateral LE extension with UE flexion 5 reps each; seated marching 5 reps each; performed unilateral shoulder flexion each UE with use of 2# dumbbell in each hand 10 reps each; bil. Shoulder flexion to 90 with 2# dumbbell in each hand 5 reps each - pt reported arms were fatiguing after this exercise  Step ups onto 6" step with use of bil. Hand rails for assist with balance - 10 reps each  Alternate tap ups to 6" step 5 reps each leg - instructed pt to perform this exercise at home at steps to work on improved SLS balance on each leg  SciFit level 3.0 with bil. Ue's and LE's x 8" for LE strengthening   PATIENT EDUCATION:  Education details: safe set up for HEP, outcome measures Person educated: Patient and Spouse Education method: Explanation Education comprehension: verbalized understanding  HOME EXERCISE PROGRAM:  Access Code: [email or web address] URL: [email or web address] Date: 11/03/2022 Prepared by: Maebelle Munroe  Exercises - Prone Knee Flexion AROM  - 1 x daily - 7 x weekly - 1 sets - 10 reps - 3 sec hold - Beginner Clam  - 1 x daily - 7 x weekly - 1 sets - 10 reps - Hooklying Isometric Clamshell  - 1 x daily - 7 x weekly - 1 sets - 10 reps - 3 sec hold - SEATED SIDE PLANK - LEFT SIDE  - 1 x daily - 7 x weekly - 3 sets - 10 reps   Access Code: [email or web address] URL: [email or web address] Date: 10/13/2022 Prepared by: Maryruth Eve  Exercises - Sit to Stand  - 1 x daily - 7 x weekly - 2-3 sets - 10 reps - Sit to Stand with Resistance Around Legs  - 1 x daily - 7 x weekly - 2-3  sets - 5 reps - Standing  March with Unilateral Counter Support  - 1 x daily - 7 x weekly - 3 sets - 20 reps - Standing 3-Way Leg Reach with Resistance at Ankles and Counter Support  - 1 x daily - 7 x weekly - 2-3 sets - 10 reps  Walking with hemiwalker with gait belt and spouse supervision ~1x day  [employer] [email or web address]  GOALS: Goals reviewed with patient? Yes  SHORT TERM GOALS: Target date: 11-06-22 (4 weeks)   Pt will perform sit to stand transfer with 1 UE support only from standard chair and will stabilize upon initial standing without bracing with legs. Baseline:  bil. UE support needed from mat table, RUE support only no bracing w/ Les (11/04/22) Goal status: MET  2.  Improve Berg balance test score to >/= 31/56 to demo improved standing balance. Baseline: 26/56;  score 32/56 on 11-05-22 Goal status: GOAL MET  3.  Pt will amb. 115' with hemiwalker with CGA on flat, even surface. Baseline: gait with hemiwalker to be assessed; SBQC with CGA to min assist due to hitting object on Rt side with SBQC when amb. Around track Goal status: Met with hemiwalker; Goal partially met with [facility] -- 11-05-22  4.  Increase gait velocity to >/= 2.4 ft/sec with RW for increased gait efficiency. Baseline: 15.94 secs = 2.06 ft/sec; 15.06 sec = 2.18 ft/sec (11/04/22) Goal status: PROGRESSING  5.  Improve TUG score to </= 17 secs with RW with pt demonstrating correct hand placement with sit to/from stand transfers and safe speed, I.e. not rushing. Baseline: 18.97 secs with RW but not safe, 16.25 sec w/ RW and close SBA (11/04/22) Goal status: MET  6.  Independent with HEP for balance and strengthening exercises. Baseline:  Goal status: Goal met 11-05-22  LONG TERM GOALS: Target date: 12-04-22 (8 weeks)  Pt will perform sit to stand transfer without UE support from standard chair and will stabilize upon initial standing without bracing with legs. Baseline:  bil. UE support needed from mat  table; RUE support only no bracing w/ Les (11/04/22) Goal status: INITIAL   2.  Pt will amb. 350' with quad cane with supervision on flat, even surface for increased community accessibility. Baseline: pt using RW- modified independent household, SBA community amb. Goal status: INITIAL  3.  Improve Berg balance test score to >/= 36/56 to demo improved standing balance. Baseline: 26/56 Goal status: INITIAL  4.   Increase gait velocity to >/= 3.0 ft/sec with RW for increased gait efficiency. Baseline: 15.94 secs = 2.06 ft/sec; 15.06 sec = 2.18 ft/sec (11/04/22) Goal status: INITIAL  5.  Improve TUG score to </= 15 secs with hemiwalker (or quad cane) with pt demonstrating correct hand placement with sit to/from stand transfers and safe speed, I.e. not rushing. Baseline: 18.97 secs with RW but not safe Goal status: INITIAL  6.  Transfer floor to stand with UE support on sturdy object or furniture with supervision. Baseline: TBA Goal status: INITIAL  ASSESSMENT:  CLINICAL IMPRESSION:  PT session focused on core stabilization strengthening and LE strengthening.  Pt able to perform hip extension x 1 rep each leg and then reported fatigue as this exercise was performed after exercises in tall kneeling position.  Pt continues to fatigue quickly and requires short seated rest periods.  Pt continues to require UE support for assist with balance with SLS exercises. Cont with POC.   OBJECTIVE IMPAIRMENTS: Abnormal gait, decreased activity tolerance, decreased balance, decreased coordination, decreased knowledge of use of DME,  decreased strength, decreased safety awareness, and impaired sensation.   ACTIVITY LIMITATIONS: carrying, lifting, bending, standing, squatting, stairs, transfers, locomotion level, and driving  PARTICIPATION LIMITATIONS: meal prep, cleaning, laundry, driving, shopping, community activity, and yard work  PERSONAL FACTORS: Past/current experiences and 1 comorbidity: h/o  cervical tumors with s/p cervical fusion  are also affecting patient's functional outcome.   REHAB POTENTIAL: Good  CLINICAL DECISION MAKING: Evolving/moderate complexity  EVALUATION COMPLEXITY: Moderate  PLAN:  PT FREQUENCY:  3x/week for 4 wks followed by 2x/wk for 4 wks  PT DURATION: 8 weeks  PLANNED INTERVENTIONS: Therapeutic exercises, Therapeutic activity, Neuromuscular re-education, Balance training, Gait training, Patient/Family education, Self Care, Stair training, Orthotic/Fit training, DME instructions, and Aquatic Therapy  PLAN FOR NEXT SESSION:   work on Investment banker, operational with SBQC:  any LLE strengthening exercises - closed chain, tall kneeling, etc.:  standing balance exercises/activities; functional strengthening (sit to stands, squats), quadruped, prone, Ramps and curbs w/ cane, core strengthening, low back stretches, balance seated on exercise ball, leg press, standing/half kneel core?   Consider brief Bioness trial again with use of thigh cuff on hamstring and could retry ankle, if response still minimal recommend discontinuing bioness trial at this time and work on other interventions    Kerry Fort, PT [facility] [address] [place], [place] [address] [phone]  11/11/2022, 10:09 AM

## 2022-11-11 ENCOUNTER — Encounter: Payer: Self-pay | Admitting: Physical Therapy

## 2022-11-12 ENCOUNTER — Ambulatory Visit: Payer: Managed Care, Other (non HMO) | Admitting: Physical Therapy

## 2022-11-12 ENCOUNTER — Encounter: Payer: Self-pay | Admitting: Physical Therapy

## 2022-11-12 DIAGNOSIS — R2689 Other abnormalities of gait and mobility: Secondary | ICD-10-CM | POA: Diagnosis not present

## 2022-11-12 DIAGNOSIS — M6281 Muscle weakness (generalized): Secondary | ICD-10-CM

## 2022-11-12 NOTE — Therapy (Addendum)
OUTPATIENT PHYSICAL THERAPY NEURO TREATMENT   Patient Name: Larry Riley MRN: [id number] DOB:1980/11/11, 42 y.o., male Today's Date: 11/12/2022   PCP: Corwin Levins., MD REFERRING PROVIDER: Juliann Pares, FNP  END OF SESSION:  PT End of Session - 11/12/22 2044     Visit Number 14    Number of Visits 21    Date for PT Re-Evaluation 12/04/22    Authorization Type [employer]    Authorization - Number of Visits 60    PT Start Time 1450    PT Stop Time 1535    PT Time Calculation (min) 45 min    Equipment Utilized During Treatment Gait belt    Activity Tolerance Patient tolerated treatment well    Behavior During Therapy WFL for tasks assessed/performed                     Past Medical History:  Diagnosis Date   Anxiety    Depression    GERD (gastroesophageal reflux disease) 01/21/2017   MRSA (methicillin resistant Staphylococcus aureus) 2005   leg   Neurofibromatosis    tumor down spine and brain   Past Surgical History:  Procedure Laterality Date   hydrocephalus     shunt 1999-[place]   NECK SURGERY      tumor removal   SPINE SURGERY  10/2013   c2-t2 fusion  , laminectomy c1-6-- Dr Raynald Kemp-- [place]   VASECTOMY  02/2013   Patient Active Problem List   Diagnosis Date Noted   Skin lesion 10/24/2022   Urinary frequency 10/21/2022   Peripheral edema 08/17/2022   Hypotension 08/17/2022   Urinary incontinence 07/04/2022   Neurofibroma 04/23/2022   Neoplasm of unspecified behavior of bone, soft tissue, and skin 02/16/2022   Acute hearing loss, right 08/04/2021   Hyperglycemia 08/04/2021   Abscess of right foot 08/16/2020   Cellulitis of right foot 08/16/2020   Microhematuria 08/03/2020   Gastroesophageal reflux disease 07/07/2019   External otitis of right ear 03/09/2017   Thumb laceration, right, initial encounter 03/09/2017   Weakness of left side of body 11/19/2016   Stress due to illness of family member 06/10/2016   Syncope 04/23/2016    Onychomycosis 04/23/2016   Impacted ear wax 04/23/2016   Constipation due to opioid therapy 06/13/2014   Chronic pain syndrome 02/21/2014   Allergic reaction 02/20/2014   Neurofibromatosis, type 1 (HCC) 12/14/2013   Status post cervical arthrodesis 11/21/2013   Cervical myelopathy (HCC) 09/21/2013   Encounter for well adult exam with abnormal findings 06/23/2010   Disorder of autonomic nervous system 04/12/2009   Other psoriasis 08/24/2007   Tinea pedis 08/24/2007    ONSET DATE: 04-23-22  REFERRING DIAG:  Diagnosis  D49.2 (ICD-10-CM) - Neoplasm of unspecified behavior of bone, soft tissue, and skin    THERAPY DIAG:  Other abnormalities of gait and mobility  Muscle weakness (generalized)  Rationale for Evaluation and Treatment: Rehabilitation  SUBJECTIVE STATEMENT:  "Larry Riley"     Pt reports he did exercises on floor on yoga mat for about 30" with wife's assistance the other night.  Brought another pair of shoes today so he can do some exercises without the AFO   Pt accompanied by:  spouse, [name]  PERTINENT HISTORY: s/p T11-L1 Laminectomies for intradural tumor resections & L3-L5 laminectomies for intradural tumor resections (hospitalization 04-23-22 - 04-29-22;  Neurofibromatosis 1 diagnosed in 1995  History of foot surgery 08/2020  Patient had metal removed from R foot and infected tissue removed  Neurofibromatosis (CMS/HCC)  Neurofibromatosis, type 1 (von Recklinghausen's disease) (CMS/HCC)  Status post cervical arthrodesis 11/21/2013 - cervical fusion C2-T2, laminectomy C1- C6  PAIN:  Are you having pain? Yes: NPRS scale: 6/10 Pain location: low back "stiff" more stiffness than pain Aggravating factors: sitting in recliner chair Relieving factors: Gabapentin, some of gastro-problems seem to be  improving and pain improves with this Pain was 7-8/10 prior to surgery (on average day) - pt reports pain is much improved since surgery  PRECAUTIONS: Fall  RED FLAGS: Bowel or bladder incontinence: Yes: Improving - pt states he has mostly bladder incontinence at night; little control over starting/stopping  WEIGHT BEARING RESTRICTIONS: No  FALLS: Has patient fallen in last 6 months? Yes. Number of falls 2  LIVING ENVIRONMENT: Lives with: lives with their spouse Lives in: House/apartment Stairs: Yes: External: 2 steps; can reach both Has following equipment at home: Dan Humphreys - 2 wheeled, Wheelchair (manual), Tour manager, and hemiwalker  PLOF: Independent with basic ADLs, Independent with household mobility without device, Independent with community mobility without device, and Independent with transfers; pt drove prior to surgery on 04-23-22 (has not driven since)  PATIENT GOALS: "walk without use of device, as I was prior to surgery"; work on balance  OBJECTIVE:   DIAGNOSTIC FINDINGS: N/A  COGNITION: Overall cognitive status: Within functional limits for tasks assessed   SENSATION: Impaired in bil. LE's   COORDINATION: Decreased in bil. LE's wi  POSTURE: forward head, decreased thoracic kyphosis, and head is laterally flexed to Lt side  LOWER EXTREMITY ROM:   WFL's except for Lt ankle ROM - pt has worn AFO for > 4 yrs Decreased Rt active dorsiflexion and plantarflexion due to weakness  LOWER EXTREMITY MMT:    MMT Right Eval Left Eval  Hip flexion 5 5  Hip extension    Hip abduction    Hip adduction    Hip internal rotation    Hip external rotation    Knee flexion 4+ (seated position) 4 (seated position)  Knee extension 5 5  Ankle dorsiflexion 3- 0-1  Ankle plantarflexion    Ankle inversion    Ankle eversion    (Blank rows = not tested)  BED MOBILITY:  Modified independent   TRANSFERS: Assistive device utilized: Environmental consultant - 2 wheeled  Sit to stand: Modified  independence short distances; SBA community amb. With RW Stand to sit: Modified independence  CURB: TBA  STAIRS:  TBA  GAIT: Gait pattern: step through pattern, decreased ankle dorsiflexion- Right, decreased ankle dorsiflexion- Left, genu recurvatum- Left, and trunk rotated posterior- Left Distance walked: 80' Assistive device utilized: Environmental consultant - 2 wheeled Level of assistance: SBA Comments: AFO worn on LLE - carbon fiber with an anterior guard added (appears to be custom fabricated - pt states he obtained this brace approx. 4 yrs ago)  FUNCTIONAL TESTS:  Timed up and go (TUG): 18.97 secs with RW but pt was not safe - pt  was instructed to ambulate at his normal speed but ambulated at fast pace and was unsteady with turns so score is not totally accurate - CGA was provided for safety 10 meter walk test: 15.94 secs with RW = 2.06 ft/sec Berg Balance Scale: 26/56 3-4   PATIENT SURVEYS:  N/A for his diagnosis - s/p laminectomies for tumor resections  TODAY'S TREATMENT:  11-12-22  Gait:   Gait pattern: step through pattern, decreased ankle dorsiflexion- Right, decreased ankle dorsiflexion- Left, genu recurvatum- Left, and trunk rotated posterior- Left Distance walked: 115' x 1 rep around track; approx. 50' from bar to leg press with RW - without AFO as pt changed shoes - CGA to min assist Assistive device utilized: SBQC (pt's own); RW used for amb. Between exercise locations Level of assistance: CGA  Comments: AFO worn on LLE - carbon fiber with an anterior guard added (appears to be custom fabricated - pt states he obtained this brace approx. 4 yrs ago)  TherEx: Supine position for low back stretching;  Single knee to chest 1 rep each - 15 sec hold Double knee to chest 1 rep - 15 sec hold x 2 reps Trunk rotation - 3 reps each 15 sec hold  Bil. Hip and core strengthening exercises;  tall kneeling on mat with red ball in front for UE support; pt performed mini squats 10 reps (without  Lt AFO donned)  Heel raises - bil. LE's - 10 reps with bil. UE support on bar  Leg press - 50# 10 reps, 60# 10 reps, 70# 10 reps Attempted to perform bil. heel raise on leg press but unable to do due to weakness in bil. plantarflexors  Pt donned shoes with AFO to amb. Out of clinic at end of session   PATIENT EDUCATION:  Education details: safe set up for HEP, outcome measures Person educated: Patient and Spouse Education method: Explanation Education comprehension: verbalized understanding  HOME EXERCISE PROGRAM:  Access Code: [email or web address] URL: [email or web address] Date: 11/03/2022 Prepared by: Maebelle Munroe  Exercises - Prone Knee Flexion AROM  - 1 x daily - 7 x weekly - 1 sets - 10 reps - 3 sec hold - Beginner Clam  - 1 x daily - 7 x weekly - 1 sets - 10 reps - Hooklying Isometric Clamshell  - 1 x daily - 7 x weekly - 1 sets - 10 reps - 3 sec hold - SEATED SIDE PLANK - LEFT SIDE  - 1 x daily - 7 x weekly - 3 sets - 10 reps   Access Code: [email or web address] URL: [email or web address] Date: 10/13/2022 Prepared by: Maryruth Eve  Exercises - Sit to Stand  - 1 x daily - 7 x weekly - 2-3 sets - 10 reps - Sit to Stand with Resistance Around Legs  - 1 x daily - 7 x weekly - 2-3 sets - 5 reps - Standing March with Unilateral Counter Support  - 1 x daily - 7 x weekly - 3 sets - 20 reps - Standing 3-Way Leg Reach with Resistance at Ankles and Counter Support  - 1 x daily - 7 x weekly - 2-3 sets - 10 reps  Walking with hemiwalker with gait belt and spouse supervision ~1x day  [employer] [email or web address]  GOALS: Goals reviewed with patient? Yes  SHORT TERM GOALS: Target date: 11-06-22 (4 weeks)   Pt will perform sit to stand transfer with 1 UE support only from standard chair and will stabilize upon initial standing without bracing with legs. Baseline:  bil. UE support needed from mat table, RUE support only no bracing w/ Les (11/04/22) Goal status: MET  2.   Improve Berg balance test score to >/= 31/56 to demo improved standing balance. Baseline: 26/56;  score 32/56 on 11-05-22 Goal status: GOAL MET  3.  Pt will amb. 115' with hemiwalker with CGA on flat, even surface. Baseline: gait with hemiwalker to be assessed; SBQC with CGA to min assist due to hitting object on Rt side with SBQC when amb. Around track Goal status: Met with hemiwalker; Goal partially met with [facility] -- 11-05-22  4.  Increase gait velocity to >/= 2.4 ft/sec with RW for increased gait efficiency. Baseline: 15.94 secs = 2.06 ft/sec; 15.06 sec = 2.18 ft/sec (11/04/22) Goal status: PROGRESSING  5.  Improve TUG score to </= 17 secs with RW with pt demonstrating correct hand placement with sit to/from stand transfers and safe speed, I.e. not rushing. Baseline: 18.97 secs with RW but not safe, 16.25 sec w/ RW and close SBA (11/04/22) Goal status: MET  6.  Independent with HEP for balance and strengthening exercises. Baseline:  Goal status: Goal met 11-05-22  LONG TERM GOALS: Target date: 12-04-22 (8 weeks)  Pt will perform sit to stand transfer without UE support from standard chair and will stabilize upon initial standing without bracing with legs. Baseline:  bil. UE support needed from mat table; RUE support only no bracing w/ Les (11/04/22) Goal status: INITIAL   2.  Pt will amb. 350' with quad cane with supervision on flat, even surface for increased community accessibility. Baseline: pt using RW- modified independent household, SBA community amb. Goal status: INITIAL  3.  Improve Berg balance test score to >/= 36/56 to demo improved standing balance. Baseline: 26/56 Goal status: INITIAL  4.   Increase gait velocity to >/= 3.0 ft/sec with RW for increased gait efficiency. Baseline: 15.94 secs = 2.06 ft/sec; 15.06 sec = 2.18 ft/sec (11/04/22) Goal status: INITIAL  5.  Improve TUG score to </= 15 secs with hemiwalker (or quad cane) with pt demonstrating correct hand  placement with sit to/from stand transfers and safe speed, I.e. not rushing. Baseline: 18.97 secs with RW but not safe Goal status: INITIAL  6.  Transfer floor to stand with UE support on sturdy object or furniture with supervision. Baseline: TBA Goal status: INITIAL  ASSESSMENT:  CLINICAL IMPRESSION:  PT session focused on gait training with SBQC with Lt AFO at start of session; pt requested to perform some strengthening exercises without AFO so he donned another pair of shoes to allow movement with less restriction of LLE.  Pt unable to perform heel raise exercise due to weakness in bil. Plantarflexors.  Pt is progressing slowly but steadily towards LTG's.  Cont with POC.   OBJECTIVE IMPAIRMENTS: Abnormal gait, decreased activity tolerance, decreased balance, decreased coordination, decreased knowledge of use of DME, decreased strength, decreased safety awareness, and impaired sensation.   ACTIVITY LIMITATIONS: carrying, lifting, bending, standing, squatting, stairs, transfers, locomotion level, and driving  PARTICIPATION LIMITATIONS: meal prep, cleaning, laundry, driving, shopping, community activity, and yard work  PERSONAL FACTORS: Past/current experiences and 1 comorbidity: h/o cervical tumors with s/p cervical fusion  are also affecting patient's functional outcome.   REHAB POTENTIAL: Good  CLINICAL DECISION MAKING: Evolving/moderate complexity  EVALUATION COMPLEXITY: Moderate  PLAN:  PT FREQUENCY:  3x/week for 4 wks followed by 2x/wk for 4 wks  PT DURATION: 8 weeks  PLANNED INTERVENTIONS: Therapeutic exercises, Therapeutic activity, Neuromuscular re-education, Balance training, Gait  training, Patient/Family education, Self Care, Stair training, Orthotic/Fit training, DME instructions, and Aquatic Therapy  PLAN FOR NEXT SESSION:   work on Investment banker, operational with SBQC:  any LLE strengthening exercises - closed chain, tall kneeling, etc.:  standing balance exercises/activities;  functional strengthening (sit to stands, squats), quadruped, prone, Ramps and curbs w/ cane, core strengthening, low back stretches, balance seated on exercise ball, leg press, standing/half kneel core?   Consider brief Bioness trial again with use of thigh cuff on hamstring and could retry ankle, if response still minimal recommend discontinuing bioness trial at this time and work on other interventions    Kerry Fort, PT [facility] [address] [place], [place] [address] [phone]  11/12/2022, 8:47 PM

## 2022-11-13 ENCOUNTER — Encounter: Payer: Self-pay | Admitting: Physical Therapy

## 2022-11-17 ENCOUNTER — Ambulatory Visit: Payer: Managed Care, Other (non HMO) | Attending: Internal Medicine | Admitting: Physical Therapy

## 2022-11-17 DIAGNOSIS — R29898 Other symptoms and signs involving the musculoskeletal system: Secondary | ICD-10-CM | POA: Insufficient documentation

## 2022-11-17 DIAGNOSIS — R2681 Unsteadiness on feet: Secondary | ICD-10-CM | POA: Diagnosis present

## 2022-11-17 DIAGNOSIS — R2689 Other abnormalities of gait and mobility: Secondary | ICD-10-CM | POA: Diagnosis present

## 2022-11-17 DIAGNOSIS — R278 Other lack of coordination: Secondary | ICD-10-CM | POA: Insufficient documentation

## 2022-11-17 DIAGNOSIS — M6281 Muscle weakness (generalized): Secondary | ICD-10-CM | POA: Diagnosis present

## 2022-11-17 DIAGNOSIS — R293 Abnormal posture: Secondary | ICD-10-CM | POA: Insufficient documentation

## 2022-11-17 NOTE — Therapy (Unsigned)
OUTPATIENT PHYSICAL THERAPY NEURO TREATMENT   Patient Name: Kolten Ryback MRN: [id number] DOB:01-21-1980, 42 y.o., male Today's Date: 11/18/2022   PCP: Corwin Levins., MD REFERRING PROVIDER: Juliann Pares, FNP  END OF SESSION:  PT End of Session - 11/18/22 1931     Visit Number 15    Number of Visits 21    Date for PT Re-Evaluation 12/04/22    Authorization Type [employer]    Authorization - Visit Number 15    Authorization - Number of Visits 60    PT Start Time 1447    PT Stop Time 1534    PT Time Calculation (min) 47 min    Equipment Utilized During Treatment Gait belt    Activity Tolerance Patient tolerated treatment well    Behavior During Therapy WFL for tasks assessed/performed                      Past Medical History:  Diagnosis Date   Anxiety    Depression    GERD (gastroesophageal reflux disease) 01/21/2017   MRSA (methicillin resistant Staphylococcus aureus) 2005   leg   Neurofibromatosis    tumor down spine and brain   Past Surgical History:  Procedure Laterality Date   hydrocephalus     shunt 1999-[place]   NECK SURGERY      tumor removal   SPINE SURGERY  10/2013   c2-t2 fusion  , laminectomy c1-6-- Dr Raynald Kemp-- [place]   VASECTOMY  02/2013   Patient Active Problem List   Diagnosis Date Noted   Skin lesion 10/24/2022   Urinary frequency 10/21/2022   Peripheral edema 08/17/2022   Hypotension 08/17/2022   Urinary incontinence 07/04/2022   Neurofibroma 04/23/2022   Neoplasm of unspecified behavior of bone, soft tissue, and skin 02/16/2022   Acute hearing loss, right 08/04/2021   Hyperglycemia 08/04/2021   Abscess of right foot 08/16/2020   Cellulitis of right foot 08/16/2020   Microhematuria 08/03/2020   Gastroesophageal reflux disease 07/07/2019   External otitis of right ear 03/09/2017   Thumb laceration, right, initial encounter 03/09/2017   Weakness of left side of body 11/19/2016   Stress due to illness of family member  06/10/2016   Syncope 04/23/2016   Onychomycosis 04/23/2016   Impacted ear wax 04/23/2016   Constipation due to opioid therapy 06/13/2014   Chronic pain syndrome 02/21/2014   Allergic reaction 02/20/2014   Neurofibromatosis, type 1 (HCC) 12/14/2013   Status post cervical arthrodesis 11/21/2013   Cervical myelopathy (HCC) 09/21/2013   Encounter for well adult exam with abnormal findings 06/23/2010   Disorder of autonomic nervous system 04/12/2009   Other psoriasis 08/24/2007   Tinea pedis 08/24/2007    ONSET DATE: 04-23-22  REFERRING DIAG:  Diagnosis  D49.2 (ICD-10-CM) - Neoplasm of unspecified behavior of bone, soft tissue, and skin    THERAPY DIAG:  Other abnormalities of gait and mobility  Muscle weakness (generalized)  Unsteadiness on feet  Rationale for Evaluation and Treatment: Rehabilitation  SUBJECTIVE STATEMENT:  "[name]"     Pt reports his Rt ankle is hurting a little - twisted Rt foot when he was stepping up onto curb to come to PT appt - says his Rt shoe got caught on edge of curb.  Pt reports he did not fall - he was able to catch himself by holding himself up with his Lt arm and leg enough to clear his Rt shoe/foot   Pt accompanied by:  spouse, [name]  PERTINENT HISTORY: s/p T11-L1 Laminectomies for intradural tumor resections & L3-L5 laminectomies for intradural tumor resections (hospitalization 04-23-22 - 04-29-22;  Neurofibromatosis 1 diagnosed in 1995  History of foot surgery 08/2020  Patient had metal removed from R foot and infected tissue removed  Neurofibromatosis (CMS/HCC)  Neurofibromatosis, type 1 (von Recklinghausen's disease) (CMS/HCC)  Status post cervical arthrodesis 11/21/2013 - cervical fusion C2-T2, laminectomy C1- C6  PAIN:  11-17-22:  ankle pain - 4/10;   Lt  shoulder pain 6/10 due to holding himself up on RW to prevent fall   Are you having pain? Yes: NPRS scale: 6/10 Pain location: low back "stiff" more stiffness than pain Aggravating factors: sitting in recliner chair Relieving factors: Gabapentin, some of gastro-problems seem to be improving and pain improves with this Pain was 7-8/10 prior to surgery (on average day) - pt reports pain is much improved since surgery  PRECAUTIONS: Fall  RED FLAGS: Bowel or bladder incontinence: Yes: Improving - pt states he has mostly bladder incontinence at night; little control over starting/stopping  WEIGHT BEARING RESTRICTIONS: No  FALLS: Has patient fallen in last 6 months? Yes. Number of falls 2  LIVING ENVIRONMENT: Lives with: lives with their spouse Lives in: House/apartment Stairs: Yes: External: 2 steps; can reach both Has following equipment at home: Dan Humphreys - 2 wheeled, Wheelchair (manual), Tour manager, and hemiwalker  PLOF: Independent with basic ADLs, Independent with household mobility without device, Independent with community mobility without device, and Independent with transfers; pt drove prior to surgery on 04-23-22 (has not driven since)  PATIENT GOALS: "walk without use of device, as I was prior to surgery"; work on balance  OBJECTIVE:   DIAGNOSTIC FINDINGS: N/A  COGNITION: Overall cognitive status: Within functional limits for tasks assessed   SENSATION: Impaired in bil. LE's   COORDINATION: Decreased in bil. LE's wi  POSTURE: forward head, decreased thoracic kyphosis, and head is laterally flexed to Lt side  LOWER EXTREMITY ROM:   WFL's except for Lt ankle ROM - pt has worn AFO for > 4 yrs Decreased Rt active dorsiflexion and plantarflexion due to weakness  LOWER EXTREMITY MMT:    MMT Right Eval Left Eval  Hip flexion 5 5  Hip extension    Hip abduction    Hip adduction    Hip internal rotation    Hip external rotation    Knee flexion 4+ (seated position)  4 (seated position)  Knee extension 5 5  Ankle dorsiflexion 3- 0-1  Ankle plantarflexion    Ankle inversion    Ankle eversion    (Blank rows = not tested)  BED MOBILITY:  Modified independent   TRANSFERS: Assistive device utilized: Environmental consultant - 2 wheeled  Sit to stand: Modified independence short distances; SBA community amb. With RW Stand to sit: Modified independence  CURB: TBA  STAIRS:  TBA  GAIT: Gait pattern: step through pattern, decreased ankle dorsiflexion- Right, decreased ankle dorsiflexion- Left, genu recurvatum- Left, and trunk rotated posterior- Left Distance walked: 50' Assistive device utilized: Environmental consultant -  2 wheeled Level of assistance: SBA Comments: AFO worn on LLE - carbon fiber with an anterior guard added (appears to be custom fabricated - pt states he obtained this brace approx. 4 yrs ago)  FUNCTIONAL TESTS:  Timed up and go (TUG): 18.97 secs with RW but pt was not safe - pt was instructed to ambulate at his normal speed but ambulated at fast pace and was unsteady with turns so score is not totally accurate - CGA was provided for safety 10 meter walk test: 15.94 secs with RW = 2.06 ft/sec Berg Balance Scale: 26/56 3-4   PATIENT SURVEYS:  N/A for his diagnosis - s/p laminectomies for tumor resections  Gait:   Gait pattern: step through pattern, decreased ankle dorsiflexion- Right, decreased ankle dorsiflexion- Left, genu recurvatum- Left, and trunk rotated posterior- Left Distance walked: 115' x 1 rep around track; approx. 50' from bar to leg press with RW - without AFO as pt changed shoes - CGA to min assist Assistive device utilized:  RW used for amb. Between exercise locations Level of assistance: CGA  Comments: AFO worn on LLE - carbon fiber with an anterior guard added (appears to be custom fabricated - pt states he obtained this brace approx. 4 yrs ago)  Did not ambulate with SBQC in today's PT session due to Rt ankle pain due to near sprain sustained  just prior to today's PT session  TODAY'S TREATMENT:  11-17-22  TherEx: Supine position for hip strengthening and core strengthening  Bridging 10 reps with legs on red physioball Bridging with lifting each leg off ball 5 reps each with mod assist to stabilize ball  Hooklying position - bridging with hip abduction/adduction 5 reps Bridging with marching 5 reps   SLR 10 reps each leg - no resistance used Hooklying position - hip flexion 10 reps each leg  "Dead bug" exercise with pt holding red physioball with legs - performed alternate UE flexion - 5 reps Ball removed - hooklying position - pt performed contralateral hip flexion/UE flexion 5 reps each side  Pt sat on green physioball for core stabilization and strengthening - performed pelvic tilts posterior/anteriorly 10 reps; weight shifts laterally 10 reps with min to mod assist but without UE support on mat  Pt performed seated marching on ball - 10 reps LE extension with contralateral UE flexion - 3 sec hold 5 reps each side  Tall kneeling with kay bench in front - mini squats 10 reps;  hip abduction/adduction RLE and LLE with min assist 10 reps each LE  Leg press - bil. LE's 90#  10 reps x 3 sets                      RLE only 70# 10 reps x 1 set                      LLE only 50# 10 reps, 1 set:  60# 10 reps, 1 set     Bil. Hip and core strengthening exercises;  tall kneeling on mat with red ball in front for UE support; pt performed mini squats 10 reps (without Lt AFO donned)   Leg press - 50# 10 reps, 60# 10 reps, 70# 10 reps Attempted to perform bil. heel raise on leg press but unable to do due to weakness in bil. plantarflexors  Pt donned shoes with AFO to amb. Out of clinic at end of session   PATIENT EDUCATION:  Education details:  cont HEP Person educated: Patient and Spouse Education method: Explanation Education comprehension: verbalized understanding  HOME EXERCISE PROGRAM:  Access Code: [email or web address] URL:  [email or web address] Date: 11/03/2022 Prepared by: Maebelle Munroe  Exercises - Prone Knee Flexion AROM  - 1 x daily - 7 x weekly - 1 sets - 10 reps - 3 sec hold - Beginner Clam  - 1 x daily - 7 x weekly - 1 sets - 10 reps - Hooklying Isometric Clamshell  - 1 x daily - 7 x weekly - 1 sets - 10 reps - 3 sec hold - SEATED SIDE PLANK - LEFT SIDE  - 1 x daily - 7 x weekly - 3 sets - 10 reps   Access Code: [email or web address] URL: [email or web address] Date: 10/13/2022 Prepared by: Maryruth Eve  Exercises - Sit to Stand  - 1 x daily - 7 x weekly - 2-3 sets - 10 reps - Sit to Stand with Resistance Around Legs  - 1 x daily - 7 x weekly - 2-3 sets - 5 reps - Standing March with Unilateral Counter Support  - 1 x daily - 7 x weekly - 3 sets - 20 reps - Standing 3-Way Leg Reach with Resistance at Ankles and Counter Support  - 1 x daily - 7 x weekly - 2-3 sets - 10 reps  Walking with hemiwalker with gait belt and spouse supervision ~1x day  Medbridge [email or web address]  GOALS: Goals reviewed with patient? Yes  SHORT TERM GOALS: Target date: 11-06-22 (4 weeks)   Pt will perform sit to stand transfer with 1 UE support only from standard chair and will stabilize upon initial standing without bracing with legs. Baseline:  bil. UE support needed from mat table, RUE support only no bracing w/ Les (11/04/22) Goal status: MET  2.  Improve Berg balance test score to >/= 31/56 to demo improved standing balance. Baseline: 26/56;  score 32/56 on 11-05-22 Goal status: GOAL MET  3.  Pt will amb. 115' with hemiwalker with CGA on flat, even surface. Baseline: gait with hemiwalker to be assessed; SBQC with CGA to min assist due to hitting object on Rt side with SBQC when amb. Around track Goal status: Met with hemiwalker; Goal partially met with [facility] -- 11-05-22  4.  Increase gait velocity to >/= 2.4 ft/sec with RW for increased gait efficiency. Baseline: 15.94 secs = 2.06 ft/sec; 15.06 sec  = 2.18 ft/sec (11/04/22) Goal status: PROGRESSING  5.  Improve TUG score to </= 17 secs with RW with pt demonstrating correct hand placement with sit to/from stand transfers and safe speed, I.e. not rushing. Baseline: 18.97 secs with RW but not safe, 16.25 sec w/ RW and close SBA (11/04/22) Goal status: MET  6.  Independent with HEP for balance and strengthening exercises. Baseline:  Goal status: Goal met 11-05-22  LONG TERM GOALS: Target date: 12-04-22 (8 weeks)  Pt will perform sit to stand transfer without UE support from standard chair and will stabilize upon initial standing without bracing with legs. Baseline:  bil. UE support needed from mat table; RUE support only no bracing w/ Les (11/04/22) Goal status: INITIAL   2.  Pt will amb. 350' with quad cane with supervision on flat, even surface for increased community accessibility. Baseline: pt using RW- modified independent household, SBA community amb. Goal status: INITIAL  3.  Improve Berg balance test score to >/= 36/56 to demo improved standing balance. Baseline: 26/56 Goal status: INITIAL  4.   Increase gait velocity to >/=  3.0 ft/sec with RW for increased gait efficiency. Baseline: 15.94 secs = 2.06 ft/sec; 15.06 sec = 2.18 ft/sec (11/04/22) Goal status: INITIAL  5.  Improve TUG score to </= 15 secs with hemiwalker (or quad cane) with pt demonstrating correct hand placement with sit to/from stand transfers and safe speed, I.e. not rushing. Baseline: 18.97 secs with RW but not safe Goal status: INITIAL  6.  Transfer floor to stand with UE support on sturdy object or furniture with supervision. Baseline: TBA Goal status: INITIAL  ASSESSMENT:  CLINICAL IMPRESSION:  PT session focused on LE strengthening and core stabilization exercises with pt performing exercises in supine and in seated position on green physioball and also in tall kneeling position.  Increased stability noted in trunk with pt performing bridge  exercises, including bridging with component of LE movement noted.  Pt continues to have difficulty performing bridging with LLE extension due to weakness in Lt hip.  Gait training with [facility] was not done in today's session due to pt turning Rt ankle while stepping up onto curb, just prior to PT appt.  Pt reported discomfort in Rt ankle due to ankle rolling resulting in near fall.  Pt reported he was able to hold himself up on RW with Lt arm and leg in order to clear Rt foot onto curb.  Pt is progressing well.  Cont with POC.   OBJECTIVE IMPAIRMENTS: Abnormal gait, decreased activity tolerance, decreased balance, decreased coordination, decreased knowledge of use of DME, decreased strength, decreased safety awareness, and impaired sensation.   ACTIVITY LIMITATIONS: carrying, lifting, bending, standing, squatting, stairs, transfers, locomotion level, and driving  PARTICIPATION LIMITATIONS: meal prep, cleaning, laundry, driving, shopping, community activity, and yard work  PERSONAL FACTORS: Past/current experiences and 1 comorbidity: h/o cervical tumors with s/p cervical fusion  are also affecting patient's functional outcome.   REHAB POTENTIAL: Good  CLINICAL DECISION MAKING: Evolving/moderate complexity  EVALUATION COMPLEXITY: Moderate  PLAN:  PT FREQUENCY:  3x/week for 4 wks followed by 2x/wk for 4 wks  PT DURATION: 8 weeks  PLANNED INTERVENTIONS: Therapeutic exercises, Therapeutic activity, Neuromuscular re-education, Balance training, Gait training, Patient/Family education, Self Care, Stair training, Orthotic/Fit training, DME instructions, and Aquatic Therapy  PLAN FOR NEXT SESSION:   work on Investment banker, operational with SBQC:  any LLE strengthening exercises - closed chain, tall kneeling, etc.:  standing balance exercises/activities; functional strengthening (sit to stands, squats), quadruped, prone, Ramps and curbs w/ cane, core strengthening, low back stretches, balance seated on exercise ball,  leg press, standing/half kneel core?     Kerry Fort, PT [facility] [address] [place], [place] [address] [phone]  11/18/2022, 7:33 PM

## 2022-11-18 ENCOUNTER — Encounter: Payer: Self-pay | Admitting: Physical Therapy

## 2022-11-18 ENCOUNTER — Encounter: Payer: Self-pay | Admitting: Behavioral Health

## 2022-11-18 ENCOUNTER — Ambulatory Visit: Payer: 59 | Admitting: Behavioral Health

## 2022-11-18 DIAGNOSIS — F411 Generalized anxiety disorder: Secondary | ICD-10-CM | POA: Diagnosis not present

## 2022-11-18 NOTE — Progress Notes (Signed)
[facility] Counselor/Therapist Progress Note  Patient ID: Rafferty Postlewait, MRN: [id number],    Date: 11/18/2022  Time Spent: 59 minutes from 10:00 AM until 10:59 AM This session was held via video teletherapy. The patient consented to the video teletherapy and was located in his home office during this session.  He is aware it is the responsibility of the patient to secure confidentiality on his end of the session. The provider was in a private home office for the duration of this session.      Treatment Type: Individual Therapy  Reported Symptoms: Anxiety/stress  Mental Status Exam: Appearance:  Casual     Behavior: Appropriate  Motor: Normal  Speech/Language:  Normal Rate  Affect: Appropriate  Mood: normal  Thought process: normal  Thought content:   WNL  Sensory/Perceptual disturbances:   WNL  Orientation: oriented to person, place, time/date, situation, day of week, and month of year  Attention: Good  Concentration: Good  Memory: WNL  Fund of knowledge:  Good  Insight:   Good  Judgment:  Good  Impulse Control: Good   Risk Assessment: Danger to Self:  No Self-injurious Behavior: No Danger to Others: No Duty to Warn:no Physical Aggression / Violence:No  Access to Firearms a concern: No  Gang Involvement:No   Subjective: The patient continues to progress physically.  He continues to go to outpatient physical therapy and an work on Doctor, general practice.  He is doing the same thing at home and although there are days he gets frustrated he can tell that there is progress.  Sunday he helped his wife make soup and was on his feet a lot on Sunday afternoon and was tired but felt good that he could with the aid of his walker be of longer and feel the strength returning.  He also is working on improving his mental emotional strength.  He is learning to use cognitive reframing in a way to challenge anxious or negative thoughts and is also using coping skills to help  calm his anxiety when he becomes at times overwhelmed.  A lot of his frustration now lives with his biological father.  His father is in [place] so there is a limited amount that the patient can do to help but he is trying to stay in constant contact.  His father has issues with his phone so he has not been able to talk directly to his father but that his father using someone else's phone.  The patient does not think that his father is taking his medication consistently.  He is talking to the father's caregiver as well as his father's primary care physician.  He knows that he cannot physically go to [place] to help right now and is working with his many people as he can to get his father's services.  He is concerned that he is declining more quickly physically as well as cognitively.  His siblings do not have a great relationship with his father so he cannot expect a lot of help from them.  He is trying to build a relationship with them again, especially his sister.  He does contract for safety having no thoughts of hurting himself or anyone else. Interventions: Cognitive Behavioral Therapy  Diagnosis: Generalized anxiety disorder  Plan: I will meet with the patient every 2 to 3 weeks via care agility.  Treatment plan: We will use cognitive behavioral therapy as well as dialectical behavior therapy to help the patient reduce anxiety/stress primarily related to medical issues.  The goal is to reduce anxiety by 50% with a target date of February 12, 2023.  Goals for reducing anxiety include helping him manage thoughts and worrisome thinking contributing to feelings of anxiety as well as recognition of those thoughts.  We will look to resolve any core conflicts outside of the medical and health issues that are contributing to anxiety including family situations and relationships.  In addition to that we will identify causes for anxiety and explore ways to lower it as well as improve his ability to manage  anxiety symptoms and better handle stress.  Interventions will be to provide education about anxiety and stress to help him identify causes and symptoms.  Introduce coping skills and problem solution skills to manage anxiety including DBT distress tolerance and mindfulness skills.  We will also use cognitive behavioral therapy to identify and change anxiety provoking thought and behavior patterns. Progress: 35% [place], [facility]                  [place], [facility]               [place], [facility]               [place], [facility]               [place], [facility]               [place], [facility]

## 2022-11-19 ENCOUNTER — Ambulatory Visit: Payer: Managed Care, Other (non HMO) | Admitting: Physical Therapy

## 2022-11-19 ENCOUNTER — Encounter: Payer: Self-pay | Admitting: Physical Therapy

## 2022-11-19 DIAGNOSIS — R2681 Unsteadiness on feet: Secondary | ICD-10-CM

## 2022-11-19 DIAGNOSIS — M6281 Muscle weakness (generalized): Secondary | ICD-10-CM

## 2022-11-19 DIAGNOSIS — R2689 Other abnormalities of gait and mobility: Secondary | ICD-10-CM | POA: Diagnosis not present

## 2022-11-19 NOTE — Therapy (Signed)
OUTPATIENT PHYSICAL THERAPY NEURO TREATMENT   Patient Name: Larry Riley MRN: [id number] DOB:11/15/1980, 42 y.o., male Today's Date: 11/19/2022   PCP: Corwin Levins., MD REFERRING PROVIDER: Juliann Pares, FNP  END OF SESSION:  PT End of Session - 11/19/22 2002     Visit Number 16    Number of Visits 21    Date for PT Re-Evaluation 12/04/22    Authorization Type [employer]    Authorization - Visit Number 16    Authorization - Number of Visits 60    PT Start Time 1450    PT Stop Time 1533    PT Time Calculation (min) 43 min    Equipment Utilized During Treatment Gait belt    Activity Tolerance Patient tolerated treatment well    Behavior During Therapy WFL for tasks assessed/performed                       Past Medical History:  Diagnosis Date   Anxiety    Depression    GERD (gastroesophageal reflux disease) 01/21/2017   MRSA (methicillin resistant Staphylococcus aureus) 2005   leg   Neurofibromatosis    tumor down spine and brain   Past Surgical History:  Procedure Laterality Date   hydrocephalus     shunt 1999-[place]   NECK SURGERY      tumor removal   SPINE SURGERY  10/2013   c2-t2 fusion  , laminectomy c1-6-- Dr Raynald Kemp-- [place]   VASECTOMY  02/2013   Patient Active Problem List   Diagnosis Date Noted   Skin lesion 10/24/2022   Urinary frequency 10/21/2022   Peripheral edema 08/17/2022   Hypotension 08/17/2022   Urinary incontinence 07/04/2022   Neurofibroma 04/23/2022   Neoplasm of unspecified behavior of bone, soft tissue, and skin 02/16/2022   Acute hearing loss, right 08/04/2021   Hyperglycemia 08/04/2021   Abscess of right foot 08/16/2020   Cellulitis of right foot 08/16/2020   Microhematuria 08/03/2020   Gastroesophageal reflux disease 07/07/2019   External otitis of right ear 03/09/2017   Thumb laceration, right, initial encounter 03/09/2017   Weakness of left side of body 11/19/2016   Stress due to illness of family member  06/10/2016   Syncope 04/23/2016   Onychomycosis 04/23/2016   Impacted ear wax 04/23/2016   Constipation due to opioid therapy 06/13/2014   Chronic pain syndrome 02/21/2014   Allergic reaction 02/20/2014   Neurofibromatosis, type 1 (HCC) 12/14/2013   Status post cervical arthrodesis 11/21/2013   Cervical myelopathy (HCC) 09/21/2013   Encounter for well adult exam with abnormal findings 06/23/2010   Disorder of autonomic nervous system 04/12/2009   Other psoriasis 08/24/2007   Tinea pedis 08/24/2007    ONSET DATE: 04-23-22  REFERRING DIAG:  Diagnosis  D49.2 (ICD-10-CM) - Neoplasm of unspecified behavior of bone, soft tissue, and skin    THERAPY DIAG:  Other abnormalities of gait and mobility  Muscle weakness (generalized)  Unsteadiness on feet  Rationale for Evaluation and Treatment: Rehabilitation  SUBJECTIVE STATEMENT:  "Larry Riley"     Pt reports Rt ankle pain has resolved - stopped hurting Tuesday night; pt reports he has been doing stretches at home and also continues to do core exercises  Pt accompanied by:  spouse, [name]  PERTINENT HISTORY: s/p T11-L1 Laminectomies for intradural tumor resections & L3-L5 laminectomies for intradural tumor resections (hospitalization 04-23-22 - 04-29-22;  Neurofibromatosis 1 diagnosed in 1995  History of foot surgery 08/2020  Patient had metal removed from R foot and infected tissue removed  Neurofibromatosis (CMS/HCC)  Neurofibromatosis, type 1 (von Recklinghausen's disease) (CMS/HCC)  Status post cervical arthrodesis 11/21/2013 - cervical fusion C2-T2, laminectomy C1- C6  PAIN:  No ankle pain reported on 11-19-22; pt reports more stiffness in low back than pain   Are you having pain? Yes: NPRS scale: 2/10 Pain location: low back "stiff" more stiffness than  pain Aggravating factors: sitting in recliner chair Relieving factors: Gabapentin, some of gastro-problems seem to be improving and pain improves with this Pain was 7-8/10 prior to surgery (on average day) - pt reports pain is much improved since surgery  PRECAUTIONS: Fall  RED FLAGS: Bowel or bladder incontinence: Yes: Improving - pt states he has mostly bladder incontinence at night; little control over starting/stopping  WEIGHT BEARING RESTRICTIONS: No  FALLS: Has patient fallen in last 6 months? Yes. Number of falls 2  LIVING ENVIRONMENT: Lives with: lives with their spouse Lives in: House/apartment Stairs: Yes: External: 2 steps; can reach both Has following equipment at home: Dan Humphreys - 2 wheeled, Wheelchair (manual), Tour manager, and hemiwalker  PLOF: Independent with basic ADLs, Independent with household mobility without device, Independent with community mobility without device, and Independent with transfers; pt drove prior to surgery on 04-23-22 (has not driven since)  PATIENT GOALS: "walk without use of device, as I was prior to surgery"; work on balance  OBJECTIVE:   DIAGNOSTIC FINDINGS: N/A  COGNITION: Overall cognitive status: Within functional limits for tasks assessed   SENSATION: Impaired in bil. LE's   COORDINATION: Decreased in bil. LE's wi  POSTURE: forward head, decreased thoracic kyphosis, and head is laterally flexed to Lt side  LOWER EXTREMITY ROM:   WFL's except for Lt ankle ROM - pt has worn AFO for > 4 yrs Decreased Rt active dorsiflexion and plantarflexion due to weakness  LOWER EXTREMITY MMT:    MMT Right Eval Left Eval  Hip flexion 5 5  Hip extension    Hip abduction    Hip adduction    Hip internal rotation    Hip external rotation    Knee flexion 4+ (seated position) 4 (seated position)  Knee extension 5 5  Ankle dorsiflexion 3- 0-1  Ankle plantarflexion    Ankle inversion    Ankle eversion    (Blank rows = not tested)  BED  MOBILITY:  Modified independent   TRANSFERS: Assistive device utilized: Environmental consultant - 2 wheeled  Sit to stand: Modified independence short distances; SBA community amb. With RW Stand to sit: Modified independence  CURB: TBA  STAIRS:  TBA  GAIT: Gait pattern: step through pattern, decreased ankle dorsiflexion- Right, decreased ankle dorsiflexion- Left, genu recurvatum- Left, and trunk rotated posterior- Left Distance walked: 63' Assistive device utilized: Environmental consultant - 2 wheeled Level of assistance: SBA Comments: AFO worn on LLE - carbon fiber with an anterior guard added (appears to be custom fabricated - pt states he obtained this brace approx. 4 yrs ago)  FUNCTIONAL TESTS:  Timed up and go (TUG): 18.97 secs  with RW but pt was not safe - pt was instructed to ambulate at his normal speed but ambulated at fast pace and was unsteady with turns so score is not totally accurate - CGA was provided for safety 10 meter walk test: 15.94 secs with RW = 2.06 ft/sec Berg Balance Scale: 26/56 3-4   PATIENT SURVEYS:  N/A for his diagnosis - s/p laminectomies for tumor resections  Gait:   Gait pattern: step through pattern, decreased ankle dorsiflexion- Right, decreased ankle dorsiflexion- Left, genu recurvatum- Left, and trunk rotated posterior- Left Distance walked: 115' x 1 rep around track; approx. 50' from bar to leg press with RW - without AFO as pt changed shoes - CGA to min assist Assistive device utilized: SBQC:  RW used for amb. Between exercise locations due to c/o LE fatigue Level of assistance: CGA  Comments: AFO worn on LLE - carbon fiber with an anterior guard added (appears to be custom fabricated - pt states he obtained this brace approx. 4 yrs ago)   TODAY'S TREATMENT:  11-19-22 Gait:   Gait pattern: step through pattern, decreased ankle dorsiflexion- Right, decreased ankle dorsiflexion- Left, genu recurvatum- Left, and trunk rotated posterior- Left Distance walked: 115' x 1 rep  around track; approx. 50' from bar to leg press with RW - without AFO as pt changed shoes - CGA to min assist Assistive device utilized: SBQC:  RW used for amb. Between exercise locations due to c/o LE fatigue Level of assistance: CGA  Comments: AFO worn on LLE - carbon fiber with an anterior guard added (appears to be custom fabricated - pt states he obtained this brace approx. 4 yrs ago)  Pt gait trained inside // bars forward/backward (10'/10') x 3 reps - without UE support with CGA - cues to relax arms Pt performed sideways ambulation inside // bars for hip abductor strengthening - 10' x 2 reps   TherEx: Stretches in seated position - rolling green physioball forward, to Rt side and to Lt side 15 sec hold in each position - 2 reps   Pt performed sit to stand transfers from mat with minimal RUE support - cues to not stabilize with legs against mat table - CGA for safety  Tall kneeling with kay bench in front - mini squats 10 reps; green theraband used -  Resistance band pull backs with elbows flexed at 90 degrees - 10 reps x 2 sets  Modified quadruped position with pt leaning on kay bench with forearms;  pt performed hip extension/contralateral UE flexion 3 reps each side with approx. 3 sec hold  Leg press - bil. LE's 90#  10 reps                      RLE only 70# 10 reps x 1 set                      LLE only 60# 10 reps, 1 set     PATIENT EDUCATION:  Education details: cont HEP Person educated: Patient and Spouse Education method: Explanation Education comprehension: verbalized understanding  HOME EXERCISE PROGRAM:  Access Code: [email or web address] URL: [email or web address] Date: 11/03/2022 Prepared by: Maebelle Munroe  Exercises - Prone Knee Flexion AROM  - 1 x daily - 7 x weekly - 1 sets - 10 reps - 3 sec hold - Beginner Clam  - 1 x daily - 7 x weekly - 1 sets - 10 reps - Hooklying Isometric Clamshell  - 1  x daily - 7 x weekly - 1 sets - 10 reps - 3 sec hold - SEATED  SIDE PLANK - LEFT SIDE  - 1 x daily - 7 x weekly - 3 sets - 10 reps   Access Code: [email or web address] URL: [email or web address] Date: 10/13/2022 Prepared by: Maryruth Eve  Exercises - Sit to Stand  - 1 x daily - 7 x weekly - 2-3 sets - 10 reps - Sit to Stand with Resistance Around Legs  - 1 x daily - 7 x weekly - 2-3 sets - 5 reps - Standing March with Unilateral Counter Support  - 1 x daily - 7 x weekly - 3 sets - 20 reps - Standing 3-Way Leg Reach with Resistance at Ankles and Counter Support  - 1 x daily - 7 x weekly - 2-3 sets - 10 reps  Walking with hemiwalker with gait belt and spouse supervision ~1x day  [employer] [email or web address]  GOALS: Goals reviewed with patient? Yes  SHORT TERM GOALS: Target date: 11-06-22 (4 weeks)   Pt will perform sit to stand transfer with 1 UE support only from standard chair and will stabilize upon initial standing without bracing with legs. Baseline:  bil. UE support needed from mat table, RUE support only no bracing w/ Les (11/04/22) Goal status: MET  2.  Improve Berg balance test score to >/= 31/56 to demo improved standing balance. Baseline: 26/56;  score 32/56 on 11-05-22 Goal status: GOAL MET  3.  Pt will amb. 115' with hemiwalker with CGA on flat, even surface. Baseline: gait with hemiwalker to be assessed; SBQC with CGA to min assist due to hitting object on Rt side with SBQC when amb. Around track Goal status: Met with hemiwalker; Goal partially met with [facility] -- 11-05-22  4.  Increase gait velocity to >/= 2.4 ft/sec with RW for increased gait efficiency. Baseline: 15.94 secs = 2.06 ft/sec; 15.06 sec = 2.18 ft/sec (11/04/22) Goal status: PROGRESSING  5.  Improve TUG score to </= 17 secs with RW with pt demonstrating correct hand placement with sit to/from stand transfers and safe speed, I.e. not rushing. Baseline: 18.97 secs with RW but not safe, 16.25 sec w/ RW and close SBA (11/04/22) Goal status: MET  6.  Independent with HEP  for balance and strengthening exercises. Baseline:  Goal status: Goal met 11-05-22  LONG TERM GOALS: Target date: 12-04-22 (8 weeks)  Pt will perform sit to stand transfer without UE support from standard chair and will stabilize upon initial standing without bracing with legs. Baseline:  bil. UE support needed from mat table; RUE support only no bracing w/ Les (11/04/22) Goal status: INITIAL   2.  Pt will amb. 350' with quad cane with supervision on flat, even surface for increased community accessibility. Baseline: pt using RW- modified independent household, SBA community amb. Goal status: INITIAL  3.  Improve Berg balance test score to >/= 36/56 to demo improved standing balance. Baseline: 26/56 Goal status: INITIAL  4.   Increase gait velocity to >/= 3.0 ft/sec with RW for increased gait efficiency. Baseline: 15.94 secs = 2.06 ft/sec; 15.06 sec = 2.18 ft/sec (11/04/22) Goal status: INITIAL  5.  Improve TUG score to </= 15 secs with hemiwalker (or quad cane) with pt demonstrating correct hand placement with sit to/from stand transfers and safe speed, I.e. not rushing. Baseline: 18.97 secs with RW but not safe Goal status: INITIAL  6.  Transfer floor to stand with UE support on sturdy object or furniture  with supervision. Baseline: TBA Goal status: INITIAL  ASSESSMENT:  CLINICAL IMPRESSION:  PT session focused on gait training with [facility] with CGA/min assist x 1 occurrence only; overall improved balance and stability noted with ambulation with use of SBQC.  Pt continues to c/o low back tightness which increases with prolonged standing/ambulation which may be partially due to hip extensor weakness as well as latissimus muscle weakness.  Pt demonstrated improved balance/core stabilization in tall kneeling position with performance of resistance band pull backs during 2nd set.  Cont with POC.   OBJECTIVE IMPAIRMENTS: Abnormal gait, decreased activity tolerance, decreased balance,  decreased coordination, decreased knowledge of use of DME, decreased strength, decreased safety awareness, and impaired sensation.   ACTIVITY LIMITATIONS: carrying, lifting, bending, standing, squatting, stairs, transfers, locomotion level, and driving  PARTICIPATION LIMITATIONS: meal prep, cleaning, laundry, driving, shopping, community activity, and yard work  PERSONAL FACTORS: Past/current experiences and 1 comorbidity: h/o cervical tumors with s/p cervical fusion  are also affecting patient's functional outcome.   REHAB POTENTIAL: Good  CLINICAL DECISION MAKING: Evolving/moderate complexity  EVALUATION COMPLEXITY: Moderate  PLAN:  PT FREQUENCY:  3x/week for 4 wks followed by 2x/wk for 4 wks  PT DURATION: 8 weeks  PLANNED INTERVENTIONS: Therapeutic exercises, Therapeutic activity, Neuromuscular re-education, Balance training, Gait training, Patient/Family education, Self Care, Stair training, Orthotic/Fit training, DME instructions, and Aquatic Therapy  PLAN FOR NEXT SESSION:   work on Investment banker, operational with SBQC:  any LLE strengthening exercises - closed chain, tall kneeling, etc.:  standing balance exercises/activities; functional strengthening (sit to stands, squats), quadruped, prone, Ramps and curbs w/ cane, core strengthening, low back stretches, balance seated on exercise ball, leg press, standing/half kneel core?     Kerry Fort, PT [facility] [address] [place], [place] [address] [phone]  11/19/2022, 8:06 PM

## 2022-11-24 ENCOUNTER — Ambulatory Visit: Payer: Managed Care, Other (non HMO) | Admitting: Physical Therapy

## 2022-11-24 DIAGNOSIS — R2689 Other abnormalities of gait and mobility: Secondary | ICD-10-CM | POA: Diagnosis not present

## 2022-11-24 DIAGNOSIS — M6281 Muscle weakness (generalized): Secondary | ICD-10-CM

## 2022-11-24 DIAGNOSIS — R2681 Unsteadiness on feet: Secondary | ICD-10-CM

## 2022-11-24 NOTE — Therapy (Unsigned)
OUTPATIENT PHYSICAL THERAPY NEURO TREATMENT   Patient Name: Larry Riley MRN: [id number] DOB:08/08/1980, 42 y.o., male Today's Date: 11/25/2022   PCP: Corwin Levins., MD REFERRING PROVIDER: Juliann Pares, FNP  END OF SESSION:  PT End of Session - 11/25/22 1748     Visit Number 17    Number of Visits 21    Date for PT Re-Evaluation 12/04/22    Authorization Type [employer]    Authorization - Number of Visits 60    PT Start Time 1450    PT Stop Time 1534    PT Time Calculation (min) 44 min    Equipment Utilized During Treatment Gait belt    Activity Tolerance Patient tolerated treatment well    Behavior During Therapy WFL for tasks assessed/performed                        Past Medical History:  Diagnosis Date   Anxiety    Depression    GERD (gastroesophageal reflux disease) 01/21/2017   MRSA (methicillin resistant Staphylococcus aureus) 2005   leg   Neurofibromatosis    tumor down spine and brain   Past Surgical History:  Procedure Laterality Date   hydrocephalus     shunt 1999-[place]   NECK SURGERY      tumor removal   SPINE SURGERY  10/2013   c2-t2 fusion  , laminectomy c1-6-- Dr Raynald Kemp-- [place]   VASECTOMY  02/2013   Patient Active Problem List   Diagnosis Date Noted   Skin lesion 10/24/2022   Urinary frequency 10/21/2022   Peripheral edema 08/17/2022   Hypotension 08/17/2022   Urinary incontinence 07/04/2022   Neurofibroma 04/23/2022   Neoplasm of unspecified behavior of bone, soft tissue, and skin 02/16/2022   Acute hearing loss, right 08/04/2021   Hyperglycemia 08/04/2021   Abscess of right foot 08/16/2020   Cellulitis of right foot 08/16/2020   Microhematuria 08/03/2020   Gastroesophageal reflux disease 07/07/2019   External otitis of right ear 03/09/2017   Thumb laceration, right, initial encounter 03/09/2017   Weakness of left side of body 11/19/2016   Stress due to illness of family member 06/10/2016   Syncope 04/23/2016    Onychomycosis 04/23/2016   Impacted ear wax 04/23/2016   Constipation due to opioid therapy 06/13/2014   Chronic pain syndrome 02/21/2014   Allergic reaction 02/20/2014   Neurofibromatosis, type 1 (HCC) 12/14/2013   Status post cervical arthrodesis 11/21/2013   Cervical myelopathy (HCC) 09/21/2013   Encounter for well adult exam with abnormal findings 06/23/2010   Disorder of autonomic nervous system 04/12/2009   Other psoriasis 08/24/2007   Tinea pedis 08/24/2007    ONSET DATE: 04-23-22  REFERRING DIAG:  Diagnosis  D49.2 (ICD-10-CM) - Neoplasm of unspecified behavior of bone, soft tissue, and skin    THERAPY DIAG:  Other abnormalities of gait and mobility  Muscle weakness (generalized)  Unsteadiness on feet  Rationale for Evaluation and Treatment: Rehabilitation  SUBJECTIVE STATEMENT:  "[name]"     Pt asks for tennis ball for his walker, as his are worn out;  continues to do HEP without problems  Pt accompanied by:  spouse, [name]  PERTINENT HISTORY: s/p T11-L1 Laminectomies for intradural tumor resections & L3-L5 laminectomies for intradural tumor resections (hospitalization 04-23-22 - 04-29-22;  Neurofibromatosis 1 diagnosed in 1995  History of foot surgery 08/2020  Patient had metal removed from R foot and infected tissue removed  Neurofibromatosis (CMS/HCC)  Neurofibromatosis, type 1 (von Recklinghausen's disease) (CMS/HCC)  Status post cervical arthrodesis 11/21/2013 - cervical fusion C2-T2, laminectomy C1- C6  PAIN:  No ankle pain reported on 11-19-22; pt reports more stiffness in low back than pain   Are you having pain? Yes: NPRS scale: 2/10 Pain location: low back "stiff" more stiffness than pain Aggravating factors: sitting in recliner chair Relieving factors: Gabapentin, some  of gastro-problems seem to be improving and pain improves with this Pain was 7-8/10 prior to surgery (on average day) - pt reports pain is much improved since surgery  PRECAUTIONS: Fall  RED FLAGS: Bowel or bladder incontinence: Yes: Improving - pt states he has mostly bladder incontinence at night; little control over starting/stopping  WEIGHT BEARING RESTRICTIONS: No  FALLS: Has patient fallen in last 6 months? Yes. Number of falls 2  LIVING ENVIRONMENT: Lives with: lives with their spouse Lives in: House/apartment Stairs: Yes: External: 2 steps; can reach both Has following equipment at home: Dan Humphreys - 2 wheeled, Wheelchair (manual), Tour manager, and hemiwalker  PLOF: Independent with basic ADLs, Independent with household mobility without device, Independent with community mobility without device, and Independent with transfers; pt drove prior to surgery on 04-23-22 (has not driven since)  PATIENT GOALS: "walk without use of device, as I was prior to surgery"; work on balance  OBJECTIVE:   DIAGNOSTIC FINDINGS: N/A  COGNITION: Overall cognitive status: Within functional limits for tasks assessed   SENSATION: Impaired in bil. LE's   COORDINATION: Decreased in bil. LE's wi  POSTURE: forward head, decreased thoracic kyphosis, and head is laterally flexed to Lt side  LOWER EXTREMITY ROM:   WFL's except for Lt ankle ROM - pt has worn AFO for > 4 yrs Decreased Rt active dorsiflexion and plantarflexion due to weakness  LOWER EXTREMITY MMT:    MMT Right Eval Left Eval  Hip flexion 5 5  Hip extension    Hip abduction    Hip adduction    Hip internal rotation    Hip external rotation    Knee flexion 4+ (seated position) 4 (seated position)  Knee extension 5 5  Ankle dorsiflexion 3- 0-1  Ankle plantarflexion    Ankle inversion    Ankle eversion    (Blank rows = not tested)  BED MOBILITY:  Modified independent   TRANSFERS: Assistive device utilized: Environmental consultant - 2  wheeled  Sit to stand: Modified independence short distances; SBA community amb. With RW Stand to sit: Modified independence  CURB: TBA  STAIRS:  TBA  GAIT: Gait pattern: step through pattern, decreased ankle dorsiflexion- Right, decreased ankle dorsiflexion- Left, genu recurvatum- Left, and trunk rotated posterior- Left Distance walked: 45' Assistive device utilized: Environmental consultant - 2 wheeled Level of assistance: SBA Comments: AFO worn on LLE - carbon fiber with an anterior guard added (appears to be custom fabricated - pt states he obtained this brace approx. 4 yrs ago)  FUNCTIONAL TESTS:  Timed up and go (TUG): 18.97 secs with RW but pt was not safe -  pt was instructed to ambulate at his normal speed but ambulated at fast pace and was unsteady with turns so score is not totally accurate - CGA was provided for safety 10 meter walk test: 15.94 secs with RW = 2.06 ft/sec Berg Balance Scale: 26/56 3-4   PATIENT SURVEYS:  N/A for his diagnosis - s/p laminectomies for tumor resections  Gait:   Gait pattern: step through pattern, decreased ankle dorsiflexion- Right, decreased ankle dorsiflexion- Left, genu recurvatum- Left, and trunk rotated posterior- Left Distance walked: 115' x 1 rep around track; approx. 50' from bar to leg press with RW - without AFO as pt changed shoes - CGA to min assist Assistive device utilized: SBQC:  RW used for amb. Between exercise locations due to c/o LE fatigue Level of assistance: CGA  Comments: AFO worn on LLE - carbon fiber with an anterior guard added (appears to be custom fabricated - pt states he obtained this brace approx. 4 yrs ago)   TODAY'S TREATMENT:  11-24-22 Gait:   Gait pattern: step through pattern, decreased ankle dorsiflexion- Right, decreased ankle dorsiflexion- Left, genu recurvatum- Left, and trunk rotated posterior- Left Distance walked: 115' x 2 reps (230' total distance) with [facility] Assistive device utilized: SBQC:  RW used for amb.  Between exercise locations due to c/o LE fatigue Level of assistance: min to CGA Comments: AFO worn on LLE - carbon fiber with an anterior guard added (appears to be custom fabricated - pt states he obtained this brace approx. 4 yrs ago)  Pt gait trained inside // bars forward/backward (10'/10') x 2 reps - without UE support with CGA - cues to relax arms Pt performed sideways ambulation inside // bars for hip abductor strengthening - 10' x 2 reps   TherEx:  Sit to stand with Rt foot on 4" step for increased LLE weight bearing and strengthening - 10 reps with min assist for balance recovery  Stretches in seated position - rolling green physioball forward, to Rt side and to Lt side 15 sec hold in each position - 2 reps   Pt performed sit to stand transfers from mat with minimal RUE support - cues to not stabilize with legs against mat table - CGA for safety  Tall kneeling with CGA - mini squats 10 reps; green theraband used  Child's pose for low back stretching 15 sec hold x 2 reps Cat/cow 5 reps for stretching  Quadruped position with pt leaning on forearms -  pt performed hip extension 10 reps each leg  Leg press - bil. LE's 100#  10 reps x 2 sets                      RLE only 70# 10 reps x 2 sets                      LLE only 60# 10 reps; 65# 10 reps    PATIENT EDUCATION:  Education details: cont HEP Person educated: Patient and Spouse Education method: Explanation Education comprehension: verbalized understanding  HOME EXERCISE PROGRAM:  Access Code: [email or web address] URL: [email or web address] Date: 11/03/2022 Prepared by: Maebelle Munroe  Exercises - Prone Knee Flexion AROM  - 1 x daily - 7 x weekly - 1 sets - 10 reps - 3 sec hold - Beginner Clam  - 1 x daily - 7 x weekly - 1 sets - 10 reps - Hooklying Isometric Clamshell  - 1 x daily - 7 x weekly - 1  sets - 10 reps - 3 sec hold - SEATED SIDE PLANK - LEFT SIDE  - 1 x daily - 7 x weekly - 3 sets - 10 reps   Access  Code: [email or web address] URL: [email or web address] Date: 10/13/2022 Prepared by: Maryruth Eve  Exercises - Sit to Stand  - 1 x daily - 7 x weekly - 2-3 sets - 10 reps - Sit to Stand with Resistance Around Legs  - 1 x daily - 7 x weekly - 2-3 sets - 5 reps - Standing March with Unilateral Counter Support  - 1 x daily - 7 x weekly - 3 sets - 20 reps - Standing 3-Way Leg Reach with Resistance at Ankles and Counter Support  - 1 x daily - 7 x weekly - 2-3 sets - 10 reps  Walking with hemiwalker with gait belt and spouse supervision ~1x day  Med[employer] [email or web address]  GOALS: Goals reviewed with patient? Yes  SHORT TERM GOALS: Target date: 11-06-22 (4 weeks)   Pt will perform sit to stand transfer with 1 UE support only from standard chair and will stabilize upon initial standing without bracing with legs. Baseline:  bil. UE support needed from mat table, RUE support only no bracing w/ Les (11/04/22) Goal status: MET  2.  Improve Berg balance test score to >/= 31/56 to demo improved standing balance. Baseline: 26/56;  score 32/56 on 11-05-22 Goal status: GOAL MET  3.  Pt will amb. 115' with hemiwalker with CGA on flat, even surface. Baseline: gait with hemiwalker to be assessed; SBQC with CGA to min assist due to hitting object on Rt side with SBQC when amb. Around track Goal status: Met with hemiwalker; Goal partially met with [facility] -- 11-05-22  4.  Increase gait velocity to >/= 2.4 ft/sec with RW for increased gait efficiency. Baseline: 15.94 secs = 2.06 ft/sec; 15.06 sec = 2.18 ft/sec (11/04/22) Goal status: PROGRESSING  5.  Improve TUG score to </= 17 secs with RW with pt demonstrating correct hand placement with sit to/from stand transfers and safe speed, I.e. not rushing. Baseline: 18.97 secs with RW but not safe, 16.25 sec w/ RW and close SBA (11/04/22) Goal status: MET  6.  Independent with HEP for balance and strengthening exercises. Baseline:  Goal status: Goal met  11-05-22  LONG TERM GOALS: Target date: 12-04-22 (8 weeks)  Pt will perform sit to stand transfer without UE support from standard chair and will stabilize upon initial standing without bracing with legs. Baseline:  bil. UE support needed from mat table; RUE support only no bracing w/ Les (11/04/22) Goal status: INITIAL   2.  Pt will amb. 350' with quad cane with supervision on flat, even surface for increased community accessibility. Baseline: pt using RW- modified independent household, SBA community amb. Goal status: INITIAL  3.  Improve Berg balance test score to >/= 36/56 to demo improved standing balance. Baseline: 26/56 Goal status: INITIAL  4.   Increase gait velocity to >/= 3.0 ft/sec with RW for increased gait efficiency. Baseline: 15.94 secs = 2.06 ft/sec; 15.06 sec = 2.18 ft/sec (11/04/22) Goal status: INITIAL  5.  Improve TUG score to </= 15 secs with hemiwalker (or quad cane) with pt demonstrating correct hand placement with sit to/from stand transfers and safe speed, I.e. not rushing. Baseline: 18.97 secs with RW but not safe Goal status: INITIAL  6.  Transfer floor to stand with UE support on sturdy object or furniture with supervision. Baseline: TBA Goal status: INITIAL  ASSESSMENT:  CLINICAL IMPRESSION:  PT session focused on gait training with [facility] with CGA/min assist due to pt hitting object on his Rt side x 1 rep; session also focused on stretching exercises for low back musc. And LE strengthening.  Pt increased weight on leg press exercise from 90# to 100# for bil. LE strengthening.  Pt able to maintain balance in tall kneeling without UE support on object.  Cont with POC.   OBJECTIVE IMPAIRMENTS: Abnormal gait, decreased activity tolerance, decreased balance, decreased coordination, decreased knowledge of use of DME, decreased strength, decreased safety awareness, and impaired sensation.   ACTIVITY LIMITATIONS: carrying, lifting, bending, standing,  squatting, stairs, transfers, locomotion level, and driving  PARTICIPATION LIMITATIONS: meal prep, cleaning, laundry, driving, shopping, community activity, and yard work  PERSONAL FACTORS: Past/current experiences and 1 comorbidity: h/o cervical tumors with s/p cervical fusion  are also affecting patient's functional outcome.   REHAB POTENTIAL: Good  CLINICAL DECISION MAKING: Evolving/moderate complexity  EVALUATION COMPLEXITY: Moderate  PLAN:  PT FREQUENCY:  3x/week for 4 wks followed by 2x/wk for 4 wks  PT DURATION: 8 weeks  PLANNED INTERVENTIONS: Therapeutic exercises, Therapeutic activity, Neuromuscular re-education, Balance training, Gait training, Patient/Family education, Self Care, Stair training, Orthotic/Fit training, DME instructions, and Aquatic Therapy  PLAN FOR NEXT SESSION:   work on Investment banker, operational with SBQC:  any LLE strengthening exercises - closed chain, tall kneeling, etc.:  standing balance exercises/activities; functional strengthening (sit to stands, squats), quadruped, prone, Ramps and curbs w/ cane, core strengthening, low back stretches, balance seated on exercise ball, leg press, standing/half kneel core?     Kerry Fort, PT [facility] [address] [place], [place] [address] [phone]  11/25/2022, 5:50 PM

## 2022-11-25 ENCOUNTER — Encounter: Payer: Self-pay | Admitting: Physical Therapy

## 2022-11-26 ENCOUNTER — Ambulatory Visit: Payer: Managed Care, Other (non HMO) | Admitting: Physical Therapy

## 2022-11-26 DIAGNOSIS — M6281 Muscle weakness (generalized): Secondary | ICD-10-CM

## 2022-11-26 DIAGNOSIS — R2689 Other abnormalities of gait and mobility: Secondary | ICD-10-CM | POA: Diagnosis not present

## 2022-11-26 DIAGNOSIS — R2681 Unsteadiness on feet: Secondary | ICD-10-CM

## 2022-11-26 NOTE — Therapy (Signed)
OUTPATIENT PHYSICAL THERAPY NEURO TREATMENT   Patient Name: Larry Riley MRN: [id number] DOB:Sep 06, 1980, 42 y.o., male Today's Date: 11/27/2022   PCP: Corwin Levins., MD REFERRING PROVIDER: Juliann Pares, FNP  END OF SESSION:  PT End of Session - 11/27/22 0957     Visit Number 18    Number of Visits 21    Date for PT Re-Evaluation 12/04/22    Authorization Type [employer]    Authorization - Number of Visits 60    PT Start Time 1450    PT Stop Time 1535    PT Time Calculation (min) 45 min    Equipment Utilized During Treatment Gait belt    Activity Tolerance Patient tolerated treatment well    Behavior During Therapy WFL for tasks assessed/performed                         Past Medical History:  Diagnosis Date   Anxiety    Depression    GERD (gastroesophageal reflux disease) 01/21/2017   MRSA (methicillin resistant Staphylococcus aureus) 2005   leg   Neurofibromatosis    tumor down spine and brain   Past Surgical History:  Procedure Laterality Date   hydrocephalus     shunt 1999-[place]   NECK SURGERY      tumor removal   SPINE SURGERY  10/2013   c2-t2 fusion  , laminectomy c1-6-- Dr Raynald Kemp-- [place]   VASECTOMY  02/2013   Patient Active Problem List   Diagnosis Date Noted   Skin lesion 10/24/2022   Urinary frequency 10/21/2022   Peripheral edema 08/17/2022   Hypotension 08/17/2022   Urinary incontinence 07/04/2022   Neurofibroma 04/23/2022   Neoplasm of unspecified behavior of bone, soft tissue, and skin 02/16/2022   Acute hearing loss, right 08/04/2021   Hyperglycemia 08/04/2021   Abscess of right foot 08/16/2020   Cellulitis of right foot 08/16/2020   Microhematuria 08/03/2020   Gastroesophageal reflux disease 07/07/2019   External otitis of right ear 03/09/2017   Thumb laceration, right, initial encounter 03/09/2017   Weakness of left side of body 11/19/2016   Stress due to illness of family member 06/10/2016   Syncope  04/23/2016   Onychomycosis 04/23/2016   Impacted ear wax 04/23/2016   Constipation due to opioid therapy 06/13/2014   Chronic pain syndrome 02/21/2014   Allergic reaction 02/20/2014   Neurofibromatosis, type 1 (HCC) 12/14/2013   Status post cervical arthrodesis 11/21/2013   Cervical myelopathy (HCC) 09/21/2013   Encounter for well adult exam with abnormal findings 06/23/2010   Disorder of autonomic nervous system 04/12/2009   Other psoriasis 08/24/2007   Tinea pedis 08/24/2007    ONSET DATE: 04-23-22  REFERRING DIAG:  Diagnosis  D49.2 (ICD-10-CM) - Neoplasm of unspecified behavior of bone, soft tissue, and skin    THERAPY DIAG:  Other abnormalities of gait and mobility  Muscle weakness (generalized)  Unsteadiness on feet  Rationale for Evaluation and Treatment: Rehabilitation  SUBJECTIVE STATEMENT:  "Larry Riley"     Pt reports he did stretches at home - did child's pose and cat/cow - says they are helpful.  No new changes or problems  Pt accompanied by:  spouse, [name]  PERTINENT HISTORY: s/p T11-L1 Laminectomies for intradural tumor resections & L3-L5 laminectomies for intradural tumor resections (hospitalization 04-23-22 - 04-29-22;  Neurofibromatosis 1 diagnosed in 1995  History of foot surgery 08/2020  Patient had metal removed from R foot and infected tissue removed  Neurofibromatosis (CMS/HCC)  Neurofibromatosis, type 1 (von Recklinghausen's disease) (CMS/HCC)  Status post cervical arthrodesis 11/21/2013 - cervical fusion C2-T2, laminectomy C1- C6  PAIN:  No ankle pain reported on 11-19-22; pt reports more stiffness in low back than pain   Are you having pain? Yes: NPRS scale: 2/10 Pain location: low back "stiff" more stiffness than pain Aggravating factors: sitting in recliner  chair Relieving factors: Gabapentin, some of gastro-problems seem to be improving and pain improves with this Pain was 7-8/10 prior to surgery (on average day) - pt reports pain is much improved since surgery  PRECAUTIONS: Fall  RED FLAGS: Bowel or bladder incontinence: Yes: Improving - pt states he has mostly bladder incontinence at night; little control over starting/stopping  WEIGHT BEARING RESTRICTIONS: No  FALLS: Has patient fallen in last 6 months? Yes. Number of falls 2  LIVING ENVIRONMENT: Lives with: lives with their spouse Lives in: House/apartment Stairs: Yes: External: 2 steps; can reach both Has following equipment at home: Dan Humphreys - 2 wheeled, Wheelchair (manual), Tour manager, and hemiwalker  PLOF: Independent with basic ADLs, Independent with household mobility without device, Independent with community mobility without device, and Independent with transfers; pt drove prior to surgery on 04-23-22 (has not driven since)  PATIENT GOALS: "walk without use of device, as I was prior to surgery"; work on balance  OBJECTIVE:   DIAGNOSTIC FINDINGS: N/A  COGNITION: Overall cognitive status: Within functional limits for tasks assessed   SENSATION: Impaired in bil. LE's   COORDINATION: Decreased in bil. LE's wi  POSTURE: forward head, decreased thoracic kyphosis, and head is laterally flexed to Lt side  LOWER EXTREMITY ROM:   WFL's except for Lt ankle ROM - pt has worn AFO for > 4 yrs Decreased Rt active dorsiflexion and plantarflexion due to weakness  LOWER EXTREMITY MMT:    MMT Right Eval Left Eval  Hip flexion 5 5  Hip extension    Hip abduction    Hip adduction    Hip internal rotation    Hip external rotation    Knee flexion 4+ (seated position) 4 (seated position)  Knee extension 5 5  Ankle dorsiflexion 3- 0-1  Ankle plantarflexion    Ankle inversion    Ankle eversion    (Blank rows = not tested)  BED MOBILITY:  Modified independent    TRANSFERS: Assistive device utilized: Environmental consultant - 2 wheeled  Sit to stand: Modified independence short distances; SBA community amb. With RW Stand to sit: Modified independence  CURB: TBA  STAIRS:  TBA  GAIT: Gait pattern: step through pattern, decreased ankle dorsiflexion- Right, decreased ankle dorsiflexion- Left, genu recurvatum- Left, and trunk rotated posterior- Left Distance walked: 14' Assistive device utilized: Environmental consultant - 2 wheeled Level of assistance: SBA Comments: AFO worn on LLE - carbon fiber with an anterior guard added (appears to be custom fabricated - pt states he obtained this brace approx. 4 yrs ago)  FUNCTIONAL TESTS:  Timed up and go (TUG): 18.97 secs with RW but pt  was not safe - pt was instructed to ambulate at his normal speed but ambulated at fast pace and was unsteady with turns so score is not totally accurate - CGA was provided for safety 10 meter walk test: 15.94 secs with RW = 2.06 ft/sec Berg Balance Scale: 26/56 3-4   PATIENT SURVEYS:  N/A for his diagnosis - s/p laminectomies for tumor resections  Gait:   Gait pattern: step through pattern, decreased ankle dorsiflexion- Right, decreased ankle dorsiflexion- Left, genu recurvatum- Left, and trunk rotated posterior- Left Distance walked: 115' x 1 rep around track; approx. 50' from bar to leg press with RW - without AFO as pt changed shoes - CGA to min assist Assistive device utilized: SBQC:  RW used for amb. Between exercise locations due to c/o LE fatigue Level of assistance: CGA  Comments: AFO worn on LLE - carbon fiber with an anterior guard added (appears to be custom fabricated - pt states he obtained this brace approx. 4 yrs ago)   TODAY'S TREATMENT:  11-26-22  TherEx:   Stretching Rt low back area - seated position - piriformis stretch for Rt hip with trunk rotation - 15 sec hold x 2 reps  Pt performed sit to stand transfers from mat with minimal RUE support - cues to not stabilize with legs  against mat table - CGA for safety  Step ups RLE 10 reps onto 6" step 10 reps;  LLE 10 reps with bil. UE support on hand rails  Step down exercise from 4" step inside // bars for eccentric quad strengthening RLE and LLE 10 reps each  Leg press - bil. LE's 100#  10 reps x 2 sets                     RLE only 70# 10 reps x 2 sets                     LLE only 60# 10 reps x 2 sets  Gait:   Gait pattern: step through pattern, decreased ankle dorsiflexion- Right, decreased ankle dorsiflexion- Left, genu recurvatum- Left, and trunk rotated posterior- Left Distance walked: 115' x 2 reps (230' total distance) with [facility] Assistive device utilized: SBQC:  RW used for amb. Between exercise locations due to c/o LE fatigue Level of assistance: min to CGA Comments: AFO worn on LLE - carbon fiber with an anterior guard added (appears to be custom fabricated - pt states he obtained this brace approx. 4 yrs ago)  Step training:  pt negotiated 4 steps with use of bil. Hand rails using step over step sequence - min assist on 1st rep, with min to CGA on 2nd rep   NeuroRe-ed:  Tall kneeling with CGA - mini squats 10 reps  Quadruped position with pt leaning on forearms on kay bench -  pt performed hip extension 10 reps each leg with min assist with knee flexed  TherAct:  Tall kneeling position with kay bench in front for UE support prn - pt played zoom ball for balance and core stabilization -  performed horizontal shoulder abduction/adduction approx. 10 reps, then diagonal pattern "X" approx. 10 reps each - CGA for safety    PATIENT EDUCATION:  Education details: cont HEP Person educated: Patient and Spouse Education method: Explanation Education comprehension: verbalized understanding  HOME EXERCISE PROGRAM:  Access Code: [email or web address] URL: [email or web address] Date: 11/03/2022 Prepared by: Maebelle Munroe  Exercises - Prone Knee Flexion AROM  - 1 x daily -  7 x weekly - 1 sets - 10 reps -  3 sec hold - Beginner Clam  - 1 x daily - 7 x weekly - 1 sets - 10 reps - Hooklying Isometric Clamshell  - 1 x daily - 7 x weekly - 1 sets - 10 reps - 3 sec hold - SEATED SIDE PLANK - LEFT SIDE  - 1 x daily - 7 x weekly - 3 sets - 10 reps   Access Code: [email or web address] URL: [email or web address] Date: 10/13/2022 Prepared by: Maryruth Eve  Exercises - Sit to Stand  - 1 x daily - 7 x weekly - 2-3 sets - 10 reps - Sit to Stand with Resistance Around Legs  - 1 x daily - 7 x weekly - 2-3 sets - 5 reps - Standing March with Unilateral Counter Support  - 1 x daily - 7 x weekly - 3 sets - 20 reps - Standing 3-Way Leg Reach with Resistance at Ankles and Counter Support  - 1 x daily - 7 x weekly - 2-3 sets - 10 reps  Walking with hemiwalker with gait belt and spouse supervision ~1x day  [employer] [email or web address]  GOALS: Goals reviewed with patient? Yes  SHORT TERM GOALS: Target date: 11-06-22 (4 weeks)   Pt will perform sit to stand transfer with 1 UE support only from standard chair and will stabilize upon initial standing without bracing with legs. Baseline:  bil. UE support needed from mat table, RUE support only no bracing w/ Les (11/04/22) Goal status: MET  2.  Improve Berg balance test score to >/= 31/56 to demo improved standing balance. Baseline: 26/56;  score 32/56 on 11-05-22 Goal status: GOAL MET  3.  Pt will amb. 115' with hemiwalker with CGA on flat, even surface. Baseline: gait with hemiwalker to be assessed; SBQC with CGA to min assist due to hitting object on Rt side with SBQC when amb. Around track Goal status: Met with hemiwalker; Goal partially met with [facility] -- 11-05-22  4.  Increase gait velocity to >/= 2.4 ft/sec with RW for increased gait efficiency. Baseline: 15.94 secs = 2.06 ft/sec; 15.06 sec = 2.18 ft/sec (11/04/22) Goal status: PROGRESSING  5.  Improve TUG score to </= 17 secs with RW with pt demonstrating correct hand placement with sit to/from stand  transfers and safe speed, I.e. not rushing. Baseline: 18.97 secs with RW but not safe, 16.25 sec w/ RW and close SBA (11/04/22) Goal status: MET  6.  Independent with HEP for balance and strengthening exercises. Baseline:  Goal status: Goal met 11-05-22  LONG TERM GOALS: Target date: 12-04-22 (8 weeks)  Pt will perform sit to stand transfer without UE support from standard chair and will stabilize upon initial standing without bracing with legs. Baseline:  bil. UE support needed from mat table; RUE support only no bracing w/ Les (11/04/22) Goal status: INITIAL   2.  Pt will amb. 350' with quad cane with supervision on flat, even surface for increased community accessibility. Baseline: pt using RW- modified independent household, SBA community amb. Goal status: INITIAL  3.  Improve Berg balance test score to >/= 36/56 to demo improved standing balance. Baseline: 26/56 Goal status: INITIAL  4.   Increase gait velocity to >/= 3.0 ft/sec with RW for increased gait efficiency. Baseline: 15.94 secs = 2.06 ft/sec; 15.06 sec = 2.18 ft/sec (11/04/22) Goal status: INITIAL  5.  Improve TUG score to </= 15 secs with hemiwalker (or quad cane) with pt demonstrating correct hand  placement with sit to/from stand transfers and safe speed, I.e. not rushing. Baseline: 18.97 secs with RW but not safe Goal status: INITIAL  6.  Transfer floor to stand with UE support on sturdy object or furniture with supervision. Baseline: TBA Goal status: INITIAL  ASSESSMENT:  CLINICAL IMPRESSION:  PT session focused on gait training with [facility] with CGA/min assist and also on LE strengthening.  Pt performed step down exercise from 4" step with bil. UE support but reported increased fatigue in LE's after this exercise.  Pt did demonstrate improved core stabilization and balance in tall kneeling as able to perform zoom ball activity with only CGA, without having LOB.  Pt continues to have decreased muscle endurance in  LE's, with pt fatiguing quickly after closed chain exercise.   Cont with POC.   OBJECTIVE IMPAIRMENTS: Abnormal gait, decreased activity tolerance, decreased balance, decreased coordination, decreased knowledge of use of DME, decreased strength, decreased safety awareness, and impaired sensation.   ACTIVITY LIMITATIONS: carrying, lifting, bending, standing, squatting, stairs, transfers, locomotion level, and driving  PARTICIPATION LIMITATIONS: meal prep, cleaning, laundry, driving, shopping, community activity, and yard work  PERSONAL FACTORS: Past/current experiences and 1 comorbidity: h/o cervical tumors with s/p cervical fusion  are also affecting patient's functional outcome.   REHAB POTENTIAL: Good  CLINICAL DECISION MAKING: Evolving/moderate complexity  EVALUATION COMPLEXITY: Moderate  PLAN:  PT FREQUENCY:  3x/week for 4 wks followed by 2x/wk for 4 wks  PT DURATION: 8 weeks  PLANNED INTERVENTIONS: Therapeutic exercises, Therapeutic activity, Neuromuscular re-education, Balance training, Gait training, Patient/Family education, Self Care, Stair training, Orthotic/Fit training, DME instructions, and Aquatic Therapy  PLAN FOR NEXT SESSION:  Maralyn Sago - Please start checking LTG's - I will renew him after I see him on Thursday  work on gait training with SBQC:  any LLE strengthening exercises - closed chain, tall kneeling, etc.:  standing balance exercises/activities; functional strengthening (sit to stands, squats), quadruped, prone, Ramps and curbs w/ cane, core strengthening, low back stretches, balance seated on exercise ball, leg press, standing/half kneel core?     Kerry Fort, PT [facility] [address] [place], [place] [address] [phone]  11/27/2022, 11:06 AM

## 2022-11-27 ENCOUNTER — Encounter: Payer: Self-pay | Admitting: Physical Therapy

## 2022-12-01 ENCOUNTER — Encounter: Payer: Self-pay | Admitting: Physical Therapy

## 2022-12-01 ENCOUNTER — Ambulatory Visit: Payer: Managed Care, Other (non HMO) | Admitting: Physical Therapy

## 2022-12-01 VITALS — BP 113/81 | HR 80

## 2022-12-01 DIAGNOSIS — R2689 Other abnormalities of gait and mobility: Secondary | ICD-10-CM | POA: Diagnosis not present

## 2022-12-01 DIAGNOSIS — R2681 Unsteadiness on feet: Secondary | ICD-10-CM

## 2022-12-01 DIAGNOSIS — M6281 Muscle weakness (generalized): Secondary | ICD-10-CM

## 2022-12-01 NOTE — Therapy (Signed)
OUTPATIENT PHYSICAL THERAPY NEURO TREATMENT / SHORT GOAL CHECK   Patient Name: Larry Riley MRN: [id number] DOB:07-31-1980, 42 y.o., male Today's Date: 12/01/2022   PCP: Corwin Levins., MD REFERRING PROVIDER: Juliann Pares, FNP  END OF SESSION:  PT End of Session - 12/01/22 1453     Visit Number 19    Number of Visits 21    Date for PT Re-Evaluation 12/04/22    Authorization Type [employer]    Authorization - Visit Number 17    Authorization - Number of Visits 60    PT Start Time 1450    PT Stop Time 1530    PT Time Calculation (min) 40 min    Equipment Utilized During Treatment Gait belt    Activity Tolerance Patient tolerated treatment well    Behavior During Therapy WFL for tasks assessed/performed              Past Medical History:  Diagnosis Date   Anxiety    Depression    GERD (gastroesophageal reflux disease) 01/21/2017   MRSA (methicillin resistant Staphylococcus aureus) 2005   leg   Neurofibromatosis    tumor down spine and brain   Past Surgical History:  Procedure Laterality Date   hydrocephalus     shunt 1999-[place]   NECK SURGERY      tumor removal   SPINE SURGERY  10/2013   c2-t2 fusion  , laminectomy c1-6-- Dr Raynald Kemp-- [place]   VASECTOMY  02/2013   Patient Active Problem List   Diagnosis Date Noted   Skin lesion 10/24/2022   Urinary frequency 10/21/2022   Peripheral edema 08/17/2022   Hypotension 08/17/2022   Urinary incontinence 07/04/2022   Neurofibroma 04/23/2022   Neoplasm of unspecified behavior of bone, soft tissue, and skin 02/16/2022   Acute hearing loss, right 08/04/2021   Hyperglycemia 08/04/2021   Abscess of right foot 08/16/2020   Cellulitis of right foot 08/16/2020   Microhematuria 08/03/2020   Gastroesophageal reflux disease 07/07/2019   External otitis of right ear 03/09/2017   Thumb laceration, right, initial encounter 03/09/2017   Weakness of left side of body 11/19/2016   Stress due to illness of family  member 06/10/2016   Syncope 04/23/2016   Onychomycosis 04/23/2016   Impacted ear wax 04/23/2016   Constipation due to opioid therapy 06/13/2014   Chronic pain syndrome 02/21/2014   Allergic reaction 02/20/2014   Neurofibromatosis, type 1 (HCC) 12/14/2013   Status post cervical arthrodesis 11/21/2013   Cervical myelopathy (HCC) 09/21/2013   Encounter for well adult exam with abnormal findings 06/23/2010   Disorder of autonomic nervous system 04/12/2009   Other psoriasis 08/24/2007   Tinea pedis 08/24/2007    ONSET DATE: 04-23-22  REFERRING DIAG:  Diagnosis  D49.2 (ICD-10-CM) - Neoplasm of unspecified behavior of bone, soft tissue, and skin    THERAPY DIAG:  Other abnormalities of gait and mobility  Muscle weakness (generalized)  Unsteadiness on feet  Rationale for Evaluation and Treatment: Rehabilitation  SUBJECTIVE STATEMENT:  "Larry Riley"     Patient reports that he has been working hard on his exercises at home. Denies falls and near falls. Reports balance overall feels like it is getting better as he goes.   Pt accompanied by:  spouse, [name]  PERTINENT HISTORY: s/p T11-L1 Laminectomies for intradural tumor resections & L3-L5 laminectomies for intradural tumor resections (hospitalization 04-23-22 - 04-29-22;  Neurofibromatosis 1 diagnosed in 1995  History of foot surgery 08/2020  Patient had metal removed from R foot and infected tissue removed  Neurofibromatosis (CMS/HCC)  Neurofibromatosis, type 1 (von Recklinghausen's disease) (CMS/HCC)  Status post cervical arthrodesis 11/21/2013 - cervical fusion C2-T2, laminectomy C1- C6  PAIN:  No ankle pain reported on 11-19-22; pt reports more stiffness in low back than pain   Are you having pain? Yes: NPRS scale: 5/10 Pain location: low back "stiff"  more stiffness than pain Aggravating factors: sitting in recliner chair Relieving factors: Gabapentin, some of gastro-problems seem to be improving and pain improves with this Pain was 7-8/10 prior to surgery (on average day) - pt reports pain is much improved since surgery  PRECAUTIONS: Fall  RED FLAGS: Bowel or bladder incontinence: Yes: Improving - pt states he has mostly bladder incontinence at night; little control over starting/stopping  WEIGHT BEARING RESTRICTIONS: No  FALLS: Has patient fallen in last 6 months? Yes. Number of falls 2  LIVING ENVIRONMENT: Lives with: lives with their spouse Lives in: House/apartment Stairs: Yes: External: 2 steps; can reach both Has following equipment at home: Dan Humphreys - 2 wheeled, Wheelchair (manual), Tour manager, and hemiwalker  PLOF: Independent with basic ADLs, Independent with household mobility without device, Independent with community mobility without device, and Independent with transfers; pt drove prior to surgery on 04-23-22 (has not driven since)  PATIENT GOALS: "walk without use of device, as I was prior to surgery"; work on balance  OBJECTIVE:   DIAGNOSTIC FINDINGS: N/A  COGNITION: Overall cognitive status: Within functional limits for tasks assessed   SENSATION: Impaired in bil. LE's   COORDINATION: Decreased in bil. LE's wi  POSTURE: forward head, decreased thoracic kyphosis, and head is laterally flexed to Lt side  LOWER EXTREMITY ROM:   WFL's except for Lt ankle ROM - pt has worn AFO for > 4 yrs Decreased Rt active dorsiflexion and plantarflexion due to weakness  LOWER EXTREMITY MMT:    MMT Right Eval Left Eval  Hip flexion 5 5  Hip extension    Hip abduction    Hip adduction    Hip internal rotation    Hip external rotation    Knee flexion 4+ (seated position) 4 (seated position)  Knee extension 5 5  Ankle dorsiflexion 3- 0-1  Ankle plantarflexion    Ankle inversion    Ankle eversion    (Blank rows =  not tested)  BED MOBILITY:  Modified independent   TRANSFERS: Assistive device utilized: Environmental consultant - 2 wheeled  Sit to stand: Modified independence short distances; SBA community amb. With RW Stand to sit: Modified independence  CURB: TBA  STAIRS:  TBA  GAIT: Gait pattern: step through pattern, decreased ankle dorsiflexion- Right, decreased ankle dorsiflexion- Left, genu recurvatum- Left, and trunk rotated posterior- Left Distance walked: 76' Assistive device utilized: Environmental consultant - 2 wheeled Level of assistance: SBA Comments: AFO worn on LLE - carbon fiber with an anterior guard added (appears to be custom fabricated - pt states he obtained this brace approx. 4 yrs ago)  FUNCTIONAL TESTS:  Timed up and go (  TUG): 18.97 secs with RW but pt was not safe - pt was instructed to ambulate at his normal speed but ambulated at fast pace and was unsteady with turns so score is not totally accurate - CGA was provided for safety 10 meter walk test: 15.94 secs with RW = 2.06 ft/sec Berg Balance Scale: 26/56    PATIENT SURVEYS:  N/A for his diagnosis - s/p laminectomies for tumor resections  Gait:   Gait pattern: step through pattern, decreased ankle dorsiflexion- Right, decreased ankle dorsiflexion- Left, genu recurvatum- Left, and trunk rotated posterior- Left Distance walked: 115' x 1 rep around track; approx. 50' from bar to leg press with RW - without AFO as pt changed shoes - CGA to min assist Assistive device utilized: SBQC:  RW used for amb. Between exercise locations due to c/o LE fatigue Level of assistance: CGA  Comments: AFO worn on LLE - carbon fiber with an anterior guard added (appears to be custom fabricated - pt states he obtained this brace approx. 4 yrs ago)   TODAY'S TREATMENT:  12-01-22  Vitals:   12/01/22 1458  BP: 113/81  Pulse: 80  Seated on RUE at rest; WNL for therapy  TherAct (Goals Check):     [facility] PT Assessment - 12/01/22 0001       Standardized Balance  Assessment   Standardized Balance Assessment Timed Up and Go Test;10 meter walk test    10 Meter Walk 13.26 seconds = 2.47 ft/sec   with rollator (SBA)     Berg Balance Test   Sit to Stand Able to stand  independently using hands   requires use of hands on thighs to push up to stand   Standing Unsupported Able to stand 2 minutes with supervision    Sitting with Back Unsupported but Feet Supported on Floor or Stool Able to sit safely and securely 2 minutes    Stand to Sit Sits safely with minimal use of hands    Transfers Able to transfer safely, definite need of hands   requires use of hands   Standing Unsupported with Eyes Closed Needs help to keep from falling   looses balance to the R   Standing Unsupported with Feet Together Able to place feet together independently and stand for 1 minute with supervision    From Standing, Reach Forward with Outstretched Arm Can reach forward >5 cm safely (2")    From Standing Position, Pick up Object from Floor Unable to try/needs assist to keep balance    From Standing Position, Turn to Look Behind Over each Shoulder Needs assist to keep from losing balance and falling    Turn 360 Degrees Needs assistance while turning   loses balance to the L   Standing Unsupported, Alternately Place Feet on Step/Stool Needs assistance to keep from falling or unable to try    Standing Unsupported, One Foot in [employer] balance while stepping or standing    Standing on One Leg Unable to try or needs assist to prevent fall   fatigues out on final sets requiring modA from therapist   Total Score 22    Berg comment: 22/56 = high falls risk      Timed Up and Go Test   TUG Normal TUG    Normal TUG (seconds) 20.6   seconds with SBQC (minA); 11.95 seconds with 2WW (CGA but cues for slower pacing moving forward for safety)            PATIENT EDUCATION:  Education details: cont HEP + progress on goals Person educated: Patient and Spouse Education method:  Explanation Education comprehension: verbalized understanding  HOME EXERCISE PROGRAM:  Access Code: [email or web address] URL: [email or web address] Date: 11/03/2022 Prepared by: Maebelle Munroe  Exercises - Prone Knee Flexion AROM  - 1 x daily - 7 x weekly - 1 sets - 10 reps - 3 sec hold - Beginner Clam  - 1 x daily - 7 x weekly - 1 sets - 10 reps - Hooklying Isometric Clamshell  - 1 x daily - 7 x weekly - 1 sets - 10 reps - 3 sec hold - SEATED SIDE PLANK - LEFT SIDE  - 1 x daily - 7 x weekly - 3 sets - 10 reps   Access Code: [email or web address] URL: [email or web address] Date: 10/13/2022 Prepared by: Maryruth Eve  Exercises - Sit to Stand  - 1 x daily - 7 x weekly - 2-3 sets - 10 reps - Sit to Stand with Resistance Around Legs  - 1 x daily - 7 x weekly - 2-3 sets - 5 reps - Standing March with Unilateral Counter Support  - 1 x daily - 7 x weekly - 3 sets - 20 reps - Standing 3-Way Leg Reach with Resistance at Ankles and Counter Support  - 1 x daily - 7 x weekly - 2-3 sets - 10 reps  Walking with hemiwalker with gait belt and spouse supervision ~1x day  [employer] [id number]  GOALS: Goals reviewed with patient? Yes  SHORT TERM GOALS: Target date: 11-06-22 (4 weeks)   Pt will perform sit to stand transfer with 1 UE support only from standard chair and will stabilize upon initial standing without bracing with legs. Baseline:  bil. UE support needed from mat table, RUE support only no bracing w/ Les (11/04/22) Goal status: MET  2.  Improve Berg balance test score to >/= 31/56 to demo improved standing balance. Baseline: 26/56;  score 32/56 on 11-05-22 Goal status: GOAL MET  3.  Pt will amb. 115' with hemiwalker with CGA on flat, even surface. Baseline: gait with hemiwalker to be assessed; SBQC with CGA to min assist due to hitting object on Rt side with SBQC when amb. Around track Goal status: Met with hemiwalker; Goal partially met with [facility] -- 11-05-22  4.   Increase gait velocity to >/= 2.4 ft/sec with RW for increased gait efficiency. Baseline: 15.94 secs = 2.06 ft/sec; 15.06 sec = 2.18 ft/sec (11/04/22) Goal status: PROGRESSING  5.  Improve TUG score to </= 17 secs with RW with pt demonstrating correct hand placement with sit to/from stand transfers and safe speed, I.e. not rushing. Baseline: 18.97 secs with RW but not safe, 16.25 sec w/ RW and close SBA (11/04/22) Goal status: MET  6.  Independent with HEP for balance and strengthening exercises. Baseline:  Goal status: Goal met 11-05-22  LONG TERM GOALS: Target date: 12-04-22 (8 weeks)  Pt will perform sit to stand transfer without UE support from standard chair and will stabilize upon initial standing without bracing with legs. Baseline:  bil. UE support needed from mat table; RUE support only no bracing w/ Les (11/04/22), requires  Goal status: INITIAL   2.  Pt will amb. 350' with quad cane with supervision on flat, even surface for increased community accessibility. Baseline: pt using RW- modified independent household, SBA community amb. Goal status: INITIAL  3.  Improve Berg balance test score to >/= 36/56 to demo improved standing balance. Baseline: 26/56; regressed to 22/56 in  today's session  Goal status: NOT MET  4.   Increase gait velocity to >/= 3.0 ft/sec with RW for increased gait efficiency. Baseline: 15.94 secs = 2.06 ft/sec; 15.06 sec = 2.18 ft/sec (11/04/22); 13.26 seconds = 2.47 ft/sec (11/19) Goal status: IN PROGRESS - IMPROVING  5.  Improve TUG score to </= 15 secs with hemiwalker (or quad cane) with pt demonstrating correct hand placement with sit to/from stand transfers and safe speed, I.e. not rushing. Baseline: 18.97 secs with RW but not safe; improved to 20.6 seconds with SBQC (minA); 11.95 seconds with 2WW (CGA but cues for slower pacing moving forward for safety)   Goal status: IN PROGRESS - IMPROVING   6.  Transfer floor to stand with UE support on  sturdy object or furniture with supervision. Baseline: TBA Goal status: INITIAL  ASSESSMENT:  CLINICAL IMPRESSION:  PT session focused on starting assessment of patient's LTGs. Patient progressing overall in 2/3 LTGs that were checked in today's session including improvements in gait speed and TUG. Patient reduced TUG speed with use of SBQC due to requiring minA but improved with RW. Educated on safety with pacing. Notably, patient did demonstrate significant decline on BERG test which may be related to end of day fatigue or fatigue related to increase activity. Patient had one major LOB requiring mod-max A from therapist to lower patient to sitting on mat due to instability with turn over L leg. Patient overall will benefit from continued PT to continue to progress strength, fatigue management, and overall function. Cont with POC.   OBJECTIVE IMPAIRMENTS: Abnormal gait, decreased activity tolerance, decreased balance, decreased coordination, decreased knowledge of use of DME, decreased strength, decreased safety awareness, and impaired sensation.   ACTIVITY LIMITATIONS: carrying, lifting, bending, standing, squatting, stairs, transfers, locomotion level, and driving  PARTICIPATION LIMITATIONS: meal prep, cleaning, laundry, driving, shopping, community activity, and yard work  PERSONAL FACTORS: Past/current experiences and 1 comorbidity: h/o cervical tumors with s/p cervical fusion  are also affecting patient's functional outcome.   REHAB POTENTIAL: Good  CLINICAL DECISION MAKING: Evolving/moderate complexity  EVALUATION COMPLEXITY: Moderate  PLAN:  PT FREQUENCY:  3x/week for 4 wks followed by 2x/wk for 4 wks  PT DURATION: 8 weeks  PLANNED INTERVENTIONS: Therapeutic exercises, Therapeutic activity, Neuromuscular re-education, Balance training, Gait training, Patient/Family education, Self Care, Stair training, Orthotic/Fit training, DME instructions, and Aquatic Therapy  PLAN FOR NEXT  SESSION:  Check remaining LTGs and renew  work on gait training with SBQC:  any LLE strengthening exercises - closed chain, tall kneeling, etc.:  standing balance exercises/activities; functional strengthening (sit to stands, squats), quadruped, prone, Ramps and curbs w/ cane, core strengthening, low back stretches, balance seated on exercise ball, leg press, standing/half kneel core?   Maryruth Eve, PT, DPT [facility] [address] [place], [place] [address] [phone]  12/01/2022, 3:55 PM

## 2022-12-03 ENCOUNTER — Ambulatory Visit: Payer: Managed Care, Other (non HMO) | Admitting: Physical Therapy

## 2022-12-03 ENCOUNTER — Encounter: Payer: Self-pay | Admitting: Physical Therapy

## 2022-12-03 DIAGNOSIS — M6281 Muscle weakness (generalized): Secondary | ICD-10-CM

## 2022-12-03 DIAGNOSIS — R2689 Other abnormalities of gait and mobility: Secondary | ICD-10-CM

## 2022-12-03 DIAGNOSIS — R2681 Unsteadiness on feet: Secondary | ICD-10-CM

## 2022-12-03 NOTE — Therapy (Signed)
OUTPATIENT PHYSICAL THERAPY NEURO TREATMENT    Patient Name: Larry Riley MRN: [id number] DOB:[birth year], 42 y.o., male Today's Date: 12/03/2022   PCP: Larry Levins., MD REFERRING PROVIDER: Juliann Pares, FNP  END OF SESSION:  PT End of Session - 12/03/22 1924     Visit Number 20    Number of Visits 30   10 visits added per renewal   Date for PT Re-Evaluation 01/08/23    Authorization Type [employer]    Authorization - Visit Number 20    Authorization - Number of Visits 60    PT Start Time 1450    PT Stop Time 1535    PT Time Calculation (min) 45 min    Equipment Utilized During Treatment Gait belt    Activity Tolerance Patient tolerated treatment well    Behavior During Therapy WFL for tasks assessed/performed               Past Medical History:  Diagnosis Date   Anxiety    Depression    GERD (gastroesophageal reflux disease) 01/21/2017   MRSA (methicillin resistant Staphylococcus aureus) 2005   leg   Neurofibromatosis    tumor down spine and brain   Past Surgical History:  Procedure Laterality Date   hydrocephalus     shunt 1999-[place]   NECK SURGERY      tumor removal   SPINE SURGERY  10/2013   c2-t2 fusion  , laminectomy c1-6-- Larry Riley-- [place]   VASECTOMY  02/2013   Patient Active Problem List   Diagnosis Date Noted   Skin lesion 10/24/2022   Urinary frequency 10/21/2022   Peripheral edema 08/17/2022   Hypotension 08/17/2022   Urinary incontinence 07/04/2022   Neurofibroma 04/23/2022   Neoplasm of unspecified behavior of bone, soft tissue, and skin 02/16/2022   Acute hearing loss, right 08/04/2021   Hyperglycemia 08/04/2021   Abscess of right foot 08/16/2020   Cellulitis of right foot 08/16/2020   Microhematuria 08/03/2020   Gastroesophageal reflux disease 07/07/2019   External otitis of right ear 03/09/2017   Thumb laceration, right, initial encounter 03/09/2017   Weakness of left side of body 11/19/2016   Stress due to illness  of family member 06/10/2016   Syncope 04/23/2016   Onychomycosis 04/23/2016   Impacted ear wax 04/23/2016   Constipation due to opioid therapy 06/13/2014   Chronic pain syndrome 02/21/2014   Allergic reaction 02/20/2014   Neurofibromatosis, type 1 (HCC) 12/14/2013   Status post cervical arthrodesis 11/21/2013   Cervical myelopathy (HCC) 09/21/2013   Encounter for well adult exam with abnormal findings 06/23/2010   Disorder of autonomic nervous system 04/12/2009   Other psoriasis 08/24/2007   Tinea pedis 08/24/2007    ONSET DATE: 04-23-22  REFERRING DIAG:  Diagnosis  D49.2 (ICD-10-CM) - Neoplasm of unspecified behavior of bone, soft tissue, and skin    THERAPY DIAG:  Other abnormalities of gait and mobility  Muscle weakness (generalized)  Unsteadiness on feet  Rationale for Evaluation and Treatment: Rehabilitation  SUBJECTIVE STATEMENT:  "Larry Riley"    Patient reports legs are not as tired today as they were on Tuesday - reports they fatigued quickly on Tuesday and became shaky and unstable   Pt accompanied by:  spouse, Larry Riley  PERTINENT HISTORY: s/p T11-L1 Laminectomies for intradural tumor resections & L3-L5 laminectomies for intradural tumor resections (hospitalization 04-23-22 - 04-29-22;  Neurofibromatosis 1 diagnosed in 1995  History of foot surgery 08/2020  Patient had metal removed from R foot and infected tissue removed  Neurofibromatosis (CMS/HCC)  Neurofibromatosis, type 1 (von Recklinghausen's disease) (CMS/HCC)  Status post cervical arthrodesis 11/21/2013 - cervical fusion C2-T2, laminectomy C1- C6  PAIN:  No ankle pain reported on 11-19-22; pt reports more stiffness in low back than pain   Are you having pain? Yes: NPRS scale: 5/10 Pain location: low back "stiff" more stiffness  than pain Aggravating factors: sitting in recliner chair Relieving factors: Gabapentin, some of gastro-problems seem to be improving and pain improves with this Pain was 7-8/10 prior to surgery (on average day) - pt reports pain is much improved since surgery  PRECAUTIONS: Fall  RED FLAGS: Bowel or bladder incontinence: Yes: Improving - pt states he has mostly bladder incontinence at night; little control over starting/stopping  WEIGHT BEARING RESTRICTIONS: No  FALLS: Has patient fallen in last 6 months? Yes. Number of falls 2  LIVING ENVIRONMENT: Lives with: lives with their spouse Lives in: House/apartment Stairs: Yes: External: 2 steps; can reach both Has following equipment at home: Dan Humphreys - 2 wheeled, Wheelchair (manual), Tour manager, and hemiwalker  PLOF: Independent with basic ADLs, Independent with household mobility without device, Independent with community mobility without device, and Independent with transfers; pt drove prior to surgery on 04-23-22 (has not driven since)  PATIENT GOALS: "walk without use of device, as I was prior to surgery"; work on balance  OBJECTIVE:   DIAGNOSTIC FINDINGS: N/A  COGNITION: Overall cognitive status: Within functional limits for tasks assessed   SENSATION: Impaired in bil. LE's   COORDINATION: Decreased in bil. LE's wi  POSTURE: forward head, decreased thoracic kyphosis, and head is laterally flexed to Lt side  LOWER EXTREMITY ROM:   WFL's except for Lt ankle ROM - pt has worn AFO for > 4 yrs Decreased Rt active dorsiflexion and plantarflexion due to weakness  LOWER EXTREMITY MMT:    MMT Right Eval Left Eval  Hip flexion 5 5  Hip extension    Hip abduction    Hip adduction    Hip internal rotation    Hip external rotation    Knee flexion 4+ (seated position) 4 (seated position)  Knee extension 5 5  Ankle dorsiflexion 3- 0-1  Ankle plantarflexion    Ankle inversion    Ankle eversion    (Blank rows = not  tested)  BED MOBILITY:  Modified independent   TRANSFERS: Assistive device utilized: Environmental consultant - 2 wheeled  Sit to stand: Modified independence short distances; SBA community amb. With RW Stand to sit: Modified independence  CURB: TBA  STAIRS:  TBA  GAIT: Gait pattern: step through pattern, decreased ankle dorsiflexion- Right, decreased ankle dorsiflexion- Left, genu recurvatum- Left, and trunk rotated posterior- Left Distance walked: 53' Assistive device utilized: Environmental consultant - 2 wheeled Level of assistance: SBA Comments: AFO worn on LLE - carbon fiber with an anterior guard added (appears to be custom fabricated - pt states he obtained this brace approx. 4 yrs ago)  FUNCTIONAL TESTS:  Timed up and go (TUG): 18.97 secs with RW but  pt was not safe - pt was instructed to ambulate at his normal speed but ambulated at fast pace and was unsteady with turns so score is not totally accurate - CGA was provided for safety 10 meter walk test: 15.94 secs with RW = 2.06 ft/sec Berg Balance Scale: 26/56    PATIENT SURVEYS:  N/A for his diagnosis - s/p laminectomies for tumor resections     TODAY'S TREATMENT:  12-03-22  Gait:   Gait pattern: step through pattern, decreased ankle dorsiflexion- Right, decreased ankle dorsiflexion- Left, genu recurvatum- Left, and trunk rotated posterior- Left Distance walked: 115' x 1 rep around track - stopped for seated rest period after this distance due to c/o tightening up in low back, Rt side > Lt side Assistive device utilized: SBQC:  RW used for amb. Clinic distances after this 1 lap with SBQC Level of assistance: CGA  Comments: AFO worn on LLE - carbon fiber with an anterior guard added (appears to be custom fabricated - pt states he obtained this brace approx. 4 yrs ago)   Step training; pt negotiated 4 steps x 2 reps with use of bil. Hand rails with CGA using step over step sequence on 1st rep, step by step sequence with descension on 2nd  rep  TherEx: Pt performed low back stretching - rolling green physioball out in front for trunk flexion/low back stretching; rolling ball to Rt side and then to Lt side with approx. 15 sec hold x 2 reps each direction Child's pose on mat for low back stretching  On mat on floor - pt transferred with CGA to tall kneeling - to sitting position - to quadruped and then back to tall kneeling; 10 mini squats without UE support  5x sit to stand transfer score = 21.54 secs without UE support from mat  Leg press - bil. LE's 110# 10 reps x 3 sets                    RLE unilateral - 80# 10 reps x 2 sets                    LLE unilateral - 70# 10 reps x 2 sets  NeuroRe-ed:  Tall kneeling - performed horizontal head turns 5 reps; vertical head turns 5 reps with UE support on mat prn with CGA;  Rt 1/2 kneeling - pt unable to maintain balance without RUE support on mat table; pt held with light UE support - lifted RUE to 90 degree shoulder flexion 3 reps for improved core stabilization Lt 1/2 kneeling - pt needed UE support on mat table to maintain balance    PATIENT EDUCATION:  Education details: cont HEP + progress on goals Person educated: Patient and Spouse Education method: Explanation Education comprehension: verbalized understanding  HOME EXERCISE PROGRAM:  Access Code: [email or web address] URL: [email or web address] Date: 11/03/2022 Prepared by: Maebelle Munroe  Exercises - Prone Knee Flexion AROM  - 1 x daily - 7 x weekly - 1 sets - 10 reps - 3 sec hold - Beginner Clam  - 1 x daily - 7 x weekly - 1 sets - 10 reps - Hooklying Isometric Clamshell  - 1 x daily - 7 x weekly - 1 sets - 10 reps - 3 sec hold - SEATED SIDE PLANK - LEFT SIDE  - 1 x daily - 7 x weekly - 3 sets - 10 reps   Access Code: [email or web address] URL: [email or web address] Date: 10/13/2022 Prepared by: Maryruth Eve  Exercises - Sit to Stand  - 1 x daily - 7 x weekly - 2-3 sets - 10 reps - Sit to Stand with  Resistance Around Legs  - 1 x daily - 7 x weekly - 2-3 sets - 5 reps - Standing March with Unilateral Counter Support  - 1 x daily - 7 x weekly - 3 sets - 20 reps - Standing 3-Way Leg Reach with Resistance at Ankles and Counter Support  - 1 x daily - 7 x weekly - 2-3 sets - 10 reps  Walking with hemiwalker with gait belt and spouse supervision ~1x day  [employer] [email or web address]  GOALS: Goals reviewed with patient? Yes  SHORT TERM GOALS: Target date: 11-06-22 (4 weeks)   Pt will perform sit to stand transfer with 1 UE support only from standard chair and will stabilize upon initial standing without bracing with legs. Baseline:  bil. UE support needed from mat table, RUE support only no bracing w/ Les (11/04/22) Goal status: MET  2.  Improve Berg balance test score to >/= 31/56 to demo improved standing balance. Baseline: 26/56;  score 32/56 on 11-05-22 Goal status: GOAL MET  3.  Pt will amb. 115' with hemiwalker with CGA on flat, even surface. Baseline: gait with hemiwalker to be assessed; SBQC with CGA to min assist due to hitting object on Rt side with SBQC when amb. Around track Goal status: Met with hemiwalker; Goal partially met with [facility] -- 11-05-22  4.  Increase gait velocity to >/= 2.4 ft/sec with RW for increased gait efficiency. Baseline: 15.94 secs = 2.06 ft/sec; 15.06 sec = 2.18 ft/sec (11/04/22) Goal status: PROGRESSING  5.  Improve TUG score to </= 17 secs with RW with pt demonstrating correct hand placement with sit to/from stand transfers and safe speed, I.e. not rushing. Baseline: 18.97 secs with RW but not safe, 16.25 sec w/ RW and close SBA (11/04/22) Goal status: MET  6.  Independent with HEP for balance and strengthening exercises. Baseline:  Goal status: Goal met 11-05-22  LONG TERM GOALS: Target date: 12-04-22 (8 weeks)  Pt will perform sit to stand transfer without UE support from standard chair and will stabilize upon initial standing without bracing with  legs. Baseline:  bil. UE support needed from mat table; RUE support only no bracing w/ Les (11/04/22): 12-03-22 - balance/stability varies depending on fatigue; pt able to perform without UE support when legs are not fatigue, otherwise, needs to brace with legs for stability upon initial standing Goal status: Partially met 12-03-22   2.  Pt will amb. 350' with quad cane with supervision on flat, even surface for increased community accessibility. Baseline: pt using RW- modified independent household, SBA community amb.;  115' x1  Goal status: Not met 12-03-22 - Goal modified due to c/o low back musc. Tightening up with use of SBQC after approx. 83' distance  3.  Improve Berg balance test score to >/= 36/56 to demo improved standing balance. Baseline: 26/56; regressed to 22/56 in today's session  Goal status: NOT MET (ongoing)  4.   Increase gait velocity to >/= 3.0 ft/sec with RW for increased gait efficiency. Baseline: 15.94 secs = 2.06 ft/sec; 15.06 sec = 2.18 ft/sec (11/04/22); 13.26 seconds = 2.47 ft/sec (11/19) Goal status: IN PROGRESS - IMPROVING  5.  Improve TUG score to </= 15 secs with hemiwalker (or quad cane) with pt demonstrating correct hand placement with sit to/from stand transfers and safe speed, I.e. not rushing. Baseline: 18.97 secs  with RW but not safe; improved to 20.6 seconds with SBQC (minA); 11.95 seconds with 2WW (CGA but cues for slower pacing moving forward for safety)   Goal status: IN PROGRESS - IMPROVING   6.  Transfer floor to stand with UE support on sturdy object or furniture with supervision. Baseline: TBA Goal status: Goal met 12-03-22   NEW (REVISED) LONG TERM GOALS:  TARGET DATE 01-08-23   Pt will perform sit to stand transfer without UE support from standard chair and will stabilize upon initial standing without bracing with legs. Baseline:  bil. UE support needed from mat table; RUE support only no bracing w/ Les (11/04/22): 12-03-22 -  balance/stability varies depending on fatigue; pt able to perform without UE support when legs are not fatigue, otherwise, needs to brace with legs for stability upon initial standing Goal status: Partially met 12-03-22 - ONGOING   2.   Pt will amb. 350' with RW with supervision on flat, even surface for increased community accessibility. Baseline: pt using RW- modified independent household, SBA community amb.;  115' x1  Goal status: Not met 12-03-22 - Goal modified due to c/o low back musc. Tightening up with use of SBQC after approx. 49' distance  3.  Improve Berg balance test score to >/= 36/56 to demo improved standing balance. Baseline: 26/56; regressed to 22/56 in today's session  Goal status: NOT MET (ongoing)  4.  Increase gait velocity to >/= 3.0 ft/sec with RW for increased gait efficiency. Baseline: 15.94 secs = 2.06 ft/sec; 15.06 sec = 2.18 ft/sec (11/04/22); 13.26 seconds = 2.47 ft/sec (11/19) Goal status: IN PROGRESS - IMPROVING  5.  Improve TUG score to </= 15 secs with RW with pt demonstrating correct hand placement with sit to/from stand transfers and safe speed, I.e. not rushing. Baseline: 18.97 secs with RW but not safe; improved to 20.6 seconds with SBQC (minA); 11.95 seconds with 2WW (CGA but cues for slower pacing moving forward for safety)   Goal status: IN PROGRESS - IMPROVING   ASSESSMENT:  CLINICAL IMPRESSION:  Pt has inconsistently met LTG #1 as standing balance varies depending on fatigue of LE's; LTG #2 is not met as pt able to amb. 115' with SBQC with CGA to min assist but has onset of low back tightness increasing after amb. Approx. 80'.  This LTG has been revised to use of RW for prolonged ambulation distances and use of SBQC with short household amb. Distance.  Pt 's balance improved in today's session compared to performance in previous session on Tuesday this week; less fatigue in bil. LE's noted.  Cont with POC.   OBJECTIVE IMPAIRMENTS: Abnormal gait,  decreased activity tolerance, decreased balance, decreased coordination, decreased knowledge of use of DME, decreased strength, decreased safety awareness, and impaired sensation.   ACTIVITY LIMITATIONS: carrying, lifting, bending, standing, squatting, stairs, transfers, locomotion level, and driving  PARTICIPATION LIMITATIONS: meal prep, cleaning, laundry, driving, shopping, community activity, and yard work  PERSONAL FACTORS: Past/current experiences and 1 comorbidity: h/o cervical tumors with s/p cervical fusion  are also affecting patient's functional outcome.   REHAB POTENTIAL: Good  CLINICAL DECISION MAKING: Evolving/moderate complexity  EVALUATION COMPLEXITY: Moderate  PLAN:  PT FREQUENCY:  2x/week  PT DURATION: 5 weeks (10 additional visits per renewal)  PLANNED INTERVENTIONS: Therapeutic exercises, Therapeutic activity, Neuromuscular re-education, Balance training, Gait training, Patient/Family education, Self Care, Stair training, Orthotic/Fit training, DME instructions, and Aquatic Therapy  PLAN FOR NEXT SESSION:  continue with standing balance, gait with SBQC short distance, </=  54' to minimize low back tightness, LE strengthening, core stabilization  work on gait training with SBQC SHORT distances (50') only:  any LLE strengthening exercises - closed chain, tall kneeling, etc.:  standing balance exercises/activities; functional strengthening (sit to stands, squats), quadruped, prone, Ramps and curbs w/ cane, core strengthening, low back stretches, balance seated on exercise ball, leg press, standing/half kneel core?   Kerry Fort, PT [facility] [address] [place], [place] [address] [phone]  12/03/2022, 7:29 PM

## 2022-12-06 ENCOUNTER — Encounter: Payer: Self-pay | Admitting: Internal Medicine

## 2022-12-06 DIAGNOSIS — G894 Chronic pain syndrome: Secondary | ICD-10-CM

## 2022-12-07 ENCOUNTER — Ambulatory Visit: Payer: Managed Care, Other (non HMO) | Admitting: Physical Therapy

## 2022-12-07 MED ORDER — TRAMADOL HCL 50 MG PO TABS: ORAL_TABLET | ORAL | 2 refills | Status: DC

## 2022-12-09 ENCOUNTER — Ambulatory Visit: Payer: Managed Care, Other (non HMO) | Admitting: Physical Therapy

## 2022-12-09 DIAGNOSIS — M6281 Muscle weakness (generalized): Secondary | ICD-10-CM

## 2022-12-09 DIAGNOSIS — R29898 Other symptoms and signs involving the musculoskeletal system: Secondary | ICD-10-CM

## 2022-12-09 DIAGNOSIS — R2689 Other abnormalities of gait and mobility: Secondary | ICD-10-CM | POA: Diagnosis not present

## 2022-12-09 DIAGNOSIS — R278 Other lack of coordination: Secondary | ICD-10-CM

## 2022-12-09 DIAGNOSIS — R2681 Unsteadiness on feet: Secondary | ICD-10-CM

## 2022-12-09 DIAGNOSIS — R293 Abnormal posture: Secondary | ICD-10-CM

## 2022-12-09 NOTE — Therapy (Signed)
OUTPATIENT PHYSICAL THERAPY NEURO TREATMENT    Patient Name: Larry Riley MRN: [id number] DOB:December 07, 1980, 42 y.o., male Today's Date: 12/09/2022   PCP: Larry Riley., MD REFERRING PROVIDER: Juliann Pares, FNP  END OF SESSION:  PT End of Session - 12/09/22 1446     Visit Number 21    Number of Visits 30   10 visits added per renewal   Date for PT Re-Evaluation 01/08/23    Authorization Type [employer]    Authorization - Number of Visits 60    PT Start Time 1446    PT Stop Time 1532    PT Time Calculation (min) 46 min    Equipment Utilized During Treatment Gait belt    Activity Tolerance Patient tolerated treatment well    Behavior During Therapy WFL for tasks assessed/performed                Past Medical History:  Diagnosis Date   Anxiety    Depression    GERD (gastroesophageal reflux disease) 01/21/2017   MRSA (methicillin resistant Staphylococcus aureus) 2005   leg   Neurofibromatosis    tumor down spine and brain   Past Surgical History:  Procedure Laterality Date   hydrocephalus     shunt 1999-[place]   NECK SURGERY      tumor removal   SPINE SURGERY  10/2013   c2-t2 fusion  , laminectomy c1-6-- Dr Larry Riley-- [place]   VASECTOMY  02/2013   Patient Active Problem List   Diagnosis Date Noted   Skin lesion 10/24/2022   Urinary frequency 10/21/2022   Peripheral edema 08/17/2022   Hypotension 08/17/2022   Urinary incontinence 07/04/2022   Neurofibroma 04/23/2022   Neoplasm of unspecified behavior of bone, soft tissue, and skin 02/16/2022   Acute hearing loss, right 08/04/2021   Hyperglycemia 08/04/2021   Abscess of right foot 08/16/2020   Cellulitis of right foot 08/16/2020   Microhematuria 08/03/2020   Gastroesophageal reflux disease 07/07/2019   External otitis of right ear 03/09/2017   Thumb laceration, right, initial encounter 03/09/2017   Weakness of left side of body 11/19/2016   Stress due to illness of family member 06/10/2016    Syncope 04/23/2016   Onychomycosis 04/23/2016   Impacted ear wax 04/23/2016   Constipation due to opioid therapy 06/13/2014   Chronic pain syndrome 02/21/2014   Allergic reaction 02/20/2014   Neurofibromatosis, type 1 (HCC) 12/14/2013   Status post cervical arthrodesis 11/21/2013   Cervical myelopathy (HCC) 09/21/2013   Encounter for well adult exam with abnormal findings 06/23/2010   Disorder of autonomic nervous system 04/12/2009   Other psoriasis 08/24/2007   Tinea pedis 08/24/2007    ONSET DATE: 04-23-22  REFERRING DIAG:  Diagnosis  D49.2 (ICD-10-CM) - Neoplasm of unspecified behavior of bone, soft tissue, and skin    THERAPY DIAG:  Other abnormalities of gait and mobility  Muscle weakness (generalized)  Unsteadiness on feet  Abnormal posture  Other lack of coordination  Other symptoms and signs involving the musculoskeletal system  Rationale for Evaluation and Treatment: Rehabilitation  SUBJECTIVE STATEMENT:  "Larry Riley"    "Been doing lots of homework, lots of banded work on the floor" Pt has been sleeping with a knee pillow for several years, a special head/neck pillow for side sleeping since the surgery.   Pt accompanied by:  spouse, Larry Riley  PERTINENT HISTORY: s/p T11-L1 Laminectomies for intradural tumor resections & L3-L5 laminectomies for intradural tumor resections (hospitalization 04-23-22 - 04-29-22;  Neurofibromatosis 1 diagnosed in 1995  History of foot surgery 08/2020  Patient had metal removed from R foot and infected tissue removed  Neurofibromatosis (CMS/HCC)  Neurofibromatosis, type 1 (von Recklinghausen's disease) (CMS/HCC)  Status post cervical arthrodesis 11/21/2013 - cervical fusion C2-T2, laminectomy C1- C6  PAIN:   Are you having pain?  Yes, continued pain in low  back and occasionally in R knee Pain was 7-8/10 prior to surgery (on average day) - pt reports pain is much improved since surgery  PRECAUTIONS: Fall  RED FLAGS: Bowel or bladder incontinence: Yes: Improving - pt states he has mostly bladder incontinence at night; little control over starting/stopping  WEIGHT BEARING RESTRICTIONS: No  FALLS: Has patient fallen in last 6 months? Yes. Number of falls 2  LIVING ENVIRONMENT: Lives with: lives with their spouse Lives in: House/apartment Stairs: Yes: External: 2 steps; can reach both Has following equipment at home: Dan Humphreys - 2 wheeled, Wheelchair (manual), Tour manager, and hemiwalker  PLOF: Independent with basic ADLs, Independent with household mobility without device, Independent with community mobility without device, and Independent with transfers; pt drove prior to surgery on 04-23-22 (has not driven since)  PATIENT GOALS: "walk without use of device, as I was prior to surgery"; work on balance  OBJECTIVE:   DIAGNOSTIC FINDINGS: N/A  COGNITION: Overall cognitive status: Within functional limits for tasks assessed   SENSATION: Impaired in bil. LE's   COORDINATION: Decreased in bil. LE's wi  POSTURE: forward head, decreased thoracic kyphosis, and head is laterally flexed to Lt side  LOWER EXTREMITY ROM:   WFL's except for Lt ankle ROM - pt has worn AFO for > 4 yrs Decreased Rt active dorsiflexion and plantarflexion due to weakness  LOWER EXTREMITY MMT:    MMT Right Eval Left Eval  Hip flexion 5 5  Hip extension    Hip abduction    Hip adduction    Hip internal rotation    Hip external rotation    Knee flexion 4+ (seated position) 4 (seated position)  Knee extension 5 5  Ankle dorsiflexion 3- 0-1  Ankle plantarflexion    Ankle inversion    Ankle eversion    (Blank rows = not tested)  BED MOBILITY:  Modified independent   TRANSFERS: Assistive device utilized: Environmental consultant - 2 wheeled  Sit to stand: Modified  independence short distances; SBA community amb. With RW Stand to sit: Modified independence  CURB: TBA  STAIRS:  TBA  GAIT: Gait pattern: step through pattern, decreased ankle dorsiflexion- Right, decreased ankle dorsiflexion- Left, genu recurvatum- Left, and trunk rotated posterior- Left Distance walked: 45' Assistive device utilized: Environmental consultant - 2 wheeled Level of assistance: SBA Comments: AFO worn on LLE - carbon fiber with an anterior guard added (appears to be custom fabricated - pt states he obtained this brace approx. 4 yrs ago)  FUNCTIONAL TESTS:  Timed up and go (TUG): 18.97 secs with RW but pt was not safe - pt was instructed to ambulate at his normal speed but ambulated at fast pace and was unsteady with turns so score is not totally accurate -  CGA was provided for safety 10 meter walk test: 15.94 secs with RW = 2.06 ft/sec Berg Balance Scale: 26/56    PATIENT SURVEYS:  N/A for his diagnosis - s/p laminectomies for tumor resections     TODAY'S TREATMENT:   TherAct Figure 4 stretch+ lumbar rotation Ambulate 166ft SBQC, LLE AFO, and close SBA LBP onset ~9ft Childs pose on mat table ~15 seconds Seated Forwards flexion ~15 seconds Ambulate 142ft SBQC, LLE AFO, and close SBA R knee is "getting tired" at completion  Tall kneel on mat table w/ air ex pad under knees for comfort, w/ kay table anterior, Boxing to fatigue x4 sets 1st set ipsilateral punches 2-4 sets contralateral punches Pt with decreased strength and balance especially when reaching w/ LUE to R anterior inferior corner, frequently requires UE support Pt has more power w/ higher directed punches  TherEx Tall kneel swiss ball assisted nordic curl/plank roll out x3 sets until fatigue, CGA>Min A Verbal, visual, and tactile cues to keep hips in extension to increase core activation Note increased hip flexion when pt fatigues  Leg press w/ BLEs 110# x12 repetitions 130# x12 repetitions 150# x12  repetitions    PATIENT EDUCATION:  Education details: cont HEP Person educated: Patient and Spouse Education method: Explanation Education comprehension: verbalized understanding  HOME EXERCISE PROGRAM:   Access Code: [email or web address] URL: [email or web address] Date: 11/03/2022 Prepared by: Maebelle Munroe  Exercises - Prone Knee Flexion AROM  - 1 x daily - 7 x weekly - 1 sets - 10 reps - 3 sec hold - Beginner Clam  - 1 x daily - 7 x weekly - 1 sets - 10 reps - Hooklying Isometric Clamshell  - 1 x daily - 7 x weekly - 1 sets - 10 reps - 3 sec hold - SEATED SIDE PLANK - LEFT SIDE  - 1 x daily - 7 x weekly - 3 sets - 10 reps   Access Code: [email or web address] URL: [email or web address] Date: 10/13/2022 Prepared by: Maryruth Eve  Exercises - Sit to Stand  - 1 x daily - 7 x weekly - 2-3 sets - 10 reps - Sit to Stand with Resistance Around Legs  - 1 x daily - 7 x weekly - 2-3 sets - 5 reps - Standing March with Unilateral Counter Support  - 1 x daily - 7 x weekly - 3 sets - 20 reps - Standing 3-Way Leg Reach with Resistance at Ankles and Counter Support  - 1 x daily - 7 x weekly - 2-3 sets - 10 reps  Walking with hemiwalker with gait belt and spouse supervision ~1x day  [employer] [email or web address]  GOALS: Goals reviewed with patient? Yes  SHORT TERM GOALS: Target date: 11-06-22 (4 weeks)   Pt will perform sit to stand transfer with 1 UE support only from standard chair and will stabilize upon initial standing without bracing with legs. Baseline:  bil. UE support needed from mat table, RUE support only no bracing w/ Les (11/04/22) Goal status: MET  2.  Improve Berg balance test score to >/= 31/56 to demo improved standing balance. Baseline: 26/56;  score 32/56 on 11-05-22 Goal status: GOAL MET  3.  Pt will amb. 115' with hemiwalker with CGA on flat, even surface. Baseline: gait with hemiwalker to be assessed; SBQC with CGA to min assist due to hitting object on Rt side  with SBQC when amb. Around track Goal status: Met with hemiwalker; Goal partially met with [facility] -- 11-05-22  4.  Increase gait velocity  to >/= 2.4 ft/sec with RW for increased gait efficiency. Baseline: 15.94 secs = 2.06 ft/sec; 15.06 sec = 2.18 ft/sec (11/04/22) Goal status: PROGRESSING  5.  Improve TUG score to </= 17 secs with RW with pt demonstrating correct hand placement with sit to/from stand transfers and safe speed, I.e. not rushing. Baseline: 18.97 secs with RW but not safe, 16.25 sec w/ RW and close SBA (11/04/22) Goal status: MET  6.  Independent with HEP for balance and strengthening exercises. Baseline:  Goal status: Goal met 11-05-22  LONG TERM GOALS: Target date: 12-04-22 (8 weeks)  Pt will perform sit to stand transfer without UE support from standard chair and will stabilize upon initial standing without bracing with legs. Baseline:  bil. UE support needed from mat table; RUE support only no bracing w/ Les (11/04/22): 12-03-22 - balance/stability varies depending on fatigue; pt able to perform without UE support when legs are not fatigue, otherwise, needs to brace with legs for stability upon initial standing Goal status: Partially met 12-03-22   2.  Pt will amb. 350' with quad cane with supervision on flat, even surface for increased community accessibility. Baseline: pt using RW- modified independent household, SBA community amb.;  115' x1  Goal status: Not met 12-03-22 - Goal modified due to c/o low back musc. Tightening up with use of SBQC after approx. 68' distance  3.  Improve Berg balance test score to >/= 36/56 to demo improved standing balance. Baseline: 26/56; regressed to 22/56 in today's session  Goal status: NOT MET (ongoing)  4.   Increase gait velocity to >/= 3.0 ft/sec with RW for increased gait efficiency. Baseline: 15.94 secs = 2.06 ft/sec; 15.06 sec = 2.18 ft/sec (11/04/22); 13.26 seconds = 2.47 ft/sec (11/19) Goal status: IN PROGRESS -  IMPROVING  5.  Improve TUG score to </= 15 secs with hemiwalker (or quad cane) with pt demonstrating correct hand placement with sit to/from stand transfers and safe speed, I.e. not rushing. Baseline: 18.97 secs with RW but not safe; improved to 20.6 seconds with SBQC (minA); 11.95 seconds with 2WW (CGA but cues for slower pacing moving forward for safety)   Goal status: IN PROGRESS - IMPROVING   6.  Transfer floor to stand with UE support on sturdy object or furniture with supervision. Baseline: TBA Goal status: Goal met 12-03-22   NEW (REVISED) LONG TERM GOALS:  TARGET DATE 01-08-23   Pt will perform sit to stand transfer without UE support from standard chair and will stabilize upon initial standing without bracing with legs. Baseline:  bil. UE support needed from mat table; RUE support only no bracing w/ Les (11/04/22): 12-03-22 - balance/stability varies depending on fatigue; pt able to perform without UE support when legs are not fatigue, otherwise, needs to brace with legs for stability upon initial standing Goal status: Partially met 12-03-22 - ONGOING   2.   Pt will amb. 350' with RW with supervision on flat, even surface for increased community accessibility. Baseline: pt using RW- modified independent household, SBA community amb.;  115' x1  Goal status: Not met 12-03-22 - Goal modified due to c/o low back musc. Tightening up with use of SBQC after approx. 58' distance  3.  Improve Berg balance test score to >/= 36/56 to demo improved standing balance. Baseline: 26/56; regressed to 22/56 in today's session  Goal status: NOT MET (ongoing)  4.  Increase gait velocity to >/= 3.0 ft/sec with RW for increased gait efficiency. Baseline: 15.94 secs =  2.06 ft/sec; 15.06 sec = 2.18 ft/sec (11/04/22); 13.26 seconds = 2.47 ft/sec (11/19) Goal status: IN PROGRESS - IMPROVING  5.  Improve TUG score to </= 15 secs with RW with pt demonstrating correct hand placement with sit to/from stand  transfers and safe speed, I.e. not rushing. Baseline: 18.97 secs with RW but not safe; improved to 20.6 seconds with SBQC (minA); 11.95 seconds with 2WW (CGA but cues for slower pacing moving forward for safety)   Goal status: IN PROGRESS - IMPROVING   ASSESSMENT:  CLINICAL IMPRESSION:  Emphasis of skilled PT session on ambulating after low back stretches, and core and lower extremity strengthening. Pt w/ onset of LBP ~76ft into ambulating, but tolerable this date and improves with stretching. Pt w/ most difficulty in tall kneeling reaching crossbody w/ LUE to R anterior lower quadrant out of BOS with boxing activity, frequently requiring RUE support on kay table to maintain balance. Continue POC.   OBJECTIVE IMPAIRMENTS: Abnormal gait, decreased activity tolerance, decreased balance, decreased coordination, decreased knowledge of use of DME, decreased strength, decreased safety awareness, and impaired sensation.   ACTIVITY LIMITATIONS: carrying, lifting, bending, standing, squatting, stairs, transfers, locomotion level, and driving  PARTICIPATION LIMITATIONS: meal prep, cleaning, laundry, driving, shopping, community activity, and yard work  PERSONAL FACTORS: Past/current experiences and 1 comorbidity: h/o cervical tumors with s/p cervical fusion  are also affecting patient's functional outcome.   REHAB POTENTIAL: Good  CLINICAL DECISION MAKING: Evolving/moderate complexity  EVALUATION COMPLEXITY: Moderate  PLAN:  PT FREQUENCY:  2x/week  PT DURATION: 5 weeks (10 additional visits per renewal)  PLANNED INTERVENTIONS: Therapeutic exercises, Therapeutic activity, Neuromuscular re-education, Balance training, Gait training, Patient/Family education, Self Care, Stair training, Orthotic/Fit training, DME instructions, and Aquatic Therapy  PLAN FOR NEXT SESSION:  continue with standing balance, gait with SBQC short distance, </= 50' to minimize low back tightness, LE strengthening, core  stabilization  work on gait training with SBQC SHORT distances (50') only:  any LLE strengthening exercises - closed chain, tall kneeling, etc.:  standing balance exercises/activities; functional strengthening (sit to stands, squats), quadruped, prone, Ramps and curbs w/ cane, core strengthening, low back stretches, balance seated on exercise ball, leg press, standing/half kneel core  Reaching forwards to R lower quarter, especially cross-body reaching, challenges core   Beverely Low, SPT  [facility] [address] [place], [place] [address] [phone]  12/09/2022, 3:54 PM

## 2022-12-15 ENCOUNTER — Ambulatory Visit: Payer: Managed Care, Other (non HMO) | Attending: Nurse Practitioner | Admitting: Physical Therapy

## 2022-12-15 DIAGNOSIS — R2681 Unsteadiness on feet: Secondary | ICD-10-CM | POA: Diagnosis present

## 2022-12-15 DIAGNOSIS — M6281 Muscle weakness (generalized): Secondary | ICD-10-CM | POA: Insufficient documentation

## 2022-12-15 DIAGNOSIS — R293 Abnormal posture: Secondary | ICD-10-CM | POA: Insufficient documentation

## 2022-12-15 DIAGNOSIS — R29898 Other symptoms and signs involving the musculoskeletal system: Secondary | ICD-10-CM | POA: Insufficient documentation

## 2022-12-15 DIAGNOSIS — R2689 Other abnormalities of gait and mobility: Secondary | ICD-10-CM | POA: Diagnosis present

## 2022-12-15 DIAGNOSIS — R208 Other disturbances of skin sensation: Secondary | ICD-10-CM | POA: Diagnosis present

## 2022-12-15 NOTE — Therapy (Unsigned)
OUTPATIENT PHYSICAL THERAPY NEURO TREATMENT    Patient Name: Larry Riley MRN: [id number] DOB:23-Aug-1980, 42 y.o., male Today's Date: 12/16/2022   PCP: Corwin Levins., MD REFERRING PROVIDER: Juliann Pares, FNP  END OF SESSION:  PT End of Session - 12/16/22 1900     Visit Number 22    Number of Visits 30   10 visits added per renewal   Date for PT Re-Evaluation 01/08/23    Authorization Type [employer]    Authorization - Visit Number 22    Authorization - Number of Visits 60    PT Start Time 1447    PT Stop Time 1531    PT Time Calculation (min) 44 min    Equipment Utilized During Treatment Gait belt    Activity Tolerance Patient tolerated treatment well    Behavior During Therapy WFL for tasks assessed/performed                 Past Medical History:  Diagnosis Date   Anxiety    Depression    GERD (gastroesophageal reflux disease) 01/21/2017   MRSA (methicillin resistant Staphylococcus aureus) 2005   leg   Neurofibromatosis    tumor down spine and brain   Past Surgical History:  Procedure Laterality Date   hydrocephalus     shunt 1999-[place]   NECK SURGERY      tumor removal   SPINE SURGERY  10/2013   c2-t2 fusion  , laminectomy c1-6-- Dr Raynald Kemp-- [place]   VASECTOMY  02/2013   Patient Active Problem List   Diagnosis Date Noted   Skin lesion 10/24/2022   Urinary frequency 10/21/2022   Peripheral edema 08/17/2022   Hypotension 08/17/2022   Urinary incontinence 07/04/2022   Neurofibroma 04/23/2022   Neoplasm of unspecified behavior of bone, soft tissue, and skin 02/16/2022   Acute hearing loss, right 08/04/2021   Hyperglycemia 08/04/2021   Abscess of right foot 08/16/2020   Cellulitis of right foot 08/16/2020   Microhematuria 08/03/2020   Gastroesophageal reflux disease 07/07/2019   External otitis of right ear 03/09/2017   Thumb laceration, right, initial encounter 03/09/2017   Weakness of left side of body 11/19/2016   Stress due to  illness of family member 06/10/2016   Syncope 04/23/2016   Onychomycosis 04/23/2016   Impacted ear wax 04/23/2016   Constipation due to opioid therapy 06/13/2014   Chronic pain syndrome 02/21/2014   Allergic reaction 02/20/2014   Neurofibromatosis, type 1 (HCC) 12/14/2013   Status post cervical arthrodesis 11/21/2013   Cervical myelopathy (HCC) 09/21/2013   Encounter for well adult exam with abnormal findings 06/23/2010   Disorder of autonomic nervous system 04/12/2009   Other psoriasis 08/24/2007   Tinea pedis 08/24/2007    ONSET DATE: 04-23-22  REFERRING DIAG:  Diagnosis  D49.2 (ICD-10-CM) - Neoplasm of unspecified behavior of bone, soft tissue, and skin    THERAPY DIAG:  Other abnormalities of gait and mobility  Muscle weakness (generalized)  Unsteadiness on feet  Rationale for Evaluation and Treatment: Rehabilitation  SUBJECTIVE STATEMENT:  "[name]"    Pt reports he continues to do exercises at home - missed doing them a few times last weekend due to the Thanksgiving holiday  Pt accompanied by:  spouse, [name]  PERTINENT HISTORY: s/p T11-L1 Laminectomies for intradural tumor resections & L3-L5 laminectomies for intradural tumor resections (hospitalization 04-23-22 - 04-29-22;  Neurofibromatosis 1 diagnosed in 1995  History of foot surgery 08/2020  Patient had metal removed from R foot and infected tissue removed  Neurofibromatosis (CMS/HCC)  Neurofibromatosis, type 1 (von Recklinghausen's disease) (CMS/HCC)  Status post cervical arthrodesis 11/21/2013 - cervical fusion C2-T2, laminectomy C1- C6  PAIN:   Are you having pain?  Yes, continued pain in low back and occasionally in R knee Pain was 7-8/10 prior to surgery (on average day) - pt reports pain is much improved since  surgery  PRECAUTIONS: Fall  RED FLAGS: Bowel or bladder incontinence: Yes: Improving - pt states he has mostly bladder incontinence at night; little control over starting/stopping  WEIGHT BEARING RESTRICTIONS: No  FALLS: Has patient fallen in last 6 months? Yes. Number of falls 2  LIVING ENVIRONMENT: Lives with: lives with their spouse Lives in: House/apartment Stairs: Yes: External: 2 steps; can reach both Has following equipment at home: Dan Humphreys - 2 wheeled, Wheelchair (manual), Tour manager, and hemiwalker  PLOF: Independent with basic ADLs, Independent with household mobility without device, Independent with community mobility without device, and Independent with transfers; pt drove prior to surgery on 04-23-22 (has not driven since)  PATIENT GOALS: "walk without use of device, as I was prior to surgery"; work on balance  OBJECTIVE:   DIAGNOSTIC FINDINGS: N/A  COGNITION: Overall cognitive status: Within functional limits for tasks assessed   SENSATION: Impaired in bil. LE's   COORDINATION: Decreased in bil. LE's wi  POSTURE: forward head, decreased thoracic kyphosis, and head is laterally flexed to Lt side  LOWER EXTREMITY ROM:   WFL's except for Lt ankle ROM - pt has worn AFO for > 4 yrs Decreased Rt active dorsiflexion and plantarflexion due to weakness  LOWER EXTREMITY MMT:    MMT Right Eval Left Eval  Hip flexion 5 5  Hip extension    Hip abduction    Hip adduction    Hip internal rotation    Hip external rotation    Knee flexion 4+ (seated position) 4 (seated position)  Knee extension 5 5  Ankle dorsiflexion 3- 0-1  Ankle plantarflexion    Ankle inversion    Ankle eversion    (Blank rows = not tested)  BED MOBILITY:  Modified independent   TRANSFERS: Assistive device utilized: Environmental consultant - 2 wheeled  Sit to stand: Modified independence short distances; SBA community amb. With RW Stand to sit: Modified independence  CURB: TBA  STAIRS:   TBA  GAIT: Gait pattern: step through pattern, decreased ankle dorsiflexion- Right, decreased ankle dorsiflexion- Left, genu recurvatum- Left, and trunk rotated posterior- Left Distance walked: 26' Assistive device utilized: Environmental consultant - 2 wheeled Level of assistance: SBA Comments: AFO worn on LLE - carbon fiber with an anterior guard added (appears to be custom fabricated - pt states he obtained this brace approx. 4 yrs ago)  FUNCTIONAL TESTS:  Timed up and go (TUG): 18.97 secs with RW but pt was not safe - pt was instructed to ambulate at his normal speed but ambulated at fast pace and was unsteady with turns so score is not totally accurate - CGA was provided for safety 10 meter walk test: 15.94  secs with RW = 2.06 ft/sec Berg Balance Scale: 26/56    PATIENT SURVEYS:  N/A for his diagnosis - s/p laminectomies for tumor resections     TODAY'S TREATMENT:    TherEx: Pt performed forward trunk flexion with hands on green physioball for low back stretching - 2 reps- 15 sec hold  Pt sat on Sit Fit on mat - seated marching without UE support; knee extension with contralateral UE flexion 3 reps each side with 3 sec hold; pt performed without difficulty Pt sat on green physioball - CGA to min assist for balance; seated marching 10 reps each leg; knee extension with contralateral UE flexion 5 reps each side; pt performed bil. Shoulder flexion to approx. 90 degrees 3 reps; arms held at 90 degree shoulder flexion - performed trunk rotation 3 reps to each side with CGA  Leg press - bil. LE's - 150# 10 reps x 2 sets                    RLE only  - 90# 2 sets 10 reps        LLE only 80# 2 sets 10 reps  Neuro Re-ed: Tap ups to 1st step 5 reps each foot (alternating);  tap ups to  2nd step 5 reps each leg - consecutive taps without alternation  Step ups 10 reps RLE and LLE with use of hand rail for strengthening Sit to stand transfers from mat table - 5 reps without UE support  Gait: Pt gait  trained 115' with SBQC with CGA to min assist x 1 due to scissoring of LLE  Step negotiation - pt negotiated 4 steps x 2 reps with use of bil. Hand rails using step over step sequence with ascension and step by step sequence with descension - CGA to SBA  PATIENT EDUCATION:  Education details: cont HEP Person educated: Patient and Spouse Education method: Explanation Education comprehension: verbalized understanding  HOME EXERCISE PROGRAM:   Access Code: [email or web address] URL: [email or web address] Date: 11/03/2022 Prepared by: Maebelle Munroe  Exercises - Prone Knee Flexion AROM  - 1 x daily - 7 x weekly - 1 sets - 10 reps - 3 sec hold - Beginner Clam  - 1 x daily - 7 x weekly - 1 sets - 10 reps - Hooklying Isometric Clamshell  - 1 x daily - 7 x weekly - 1 sets - 10 reps - 3 sec hold - SEATED SIDE PLANK - LEFT SIDE  - 1 x daily - 7 x weekly - 3 sets - 10 reps   Access Code: [email or web address] URL: [email or web address] Date: 10/13/2022 Prepared by: Maryruth Eve  Exercises - Sit to Stand  - 1 x daily - 7 x weekly - 2-3 sets - 10 reps - Sit to Stand with Resistance Around Legs  - 1 x daily - 7 x weekly - 2-3 sets - 5 reps - Standing March with Unilateral Counter Support  - 1 x daily - 7 x weekly - 3 sets - 20 reps - Standing 3-Way Leg Reach with Resistance at Ankles and Counter Support  - 1 x daily - 7 x weekly - 2-3 sets - 10 reps  Walking with hemiwalker with gait belt and spouse supervision ~1x day  [employer] [email or web address]  GOALS: Goals reviewed with patient? Yes  SHORT TERM GOALS: Target date: 11-06-22 (4 weeks)   Pt will perform sit to stand transfer with 1 UE support only from standard chair and will stabilize upon initial standing  without bracing with legs. Baseline:  bil. UE support needed from mat table, RUE support only no bracing w/ Les (11/04/22) Goal status: MET  2.  Improve Berg balance test score to >/= 31/56 to demo improved standing  balance. Baseline: 26/56;  score 32/56 on 11-05-22 Goal status: GOAL MET  3.  Pt will amb. 115' with hemiwalker with CGA on flat, even surface. Baseline: gait with hemiwalker to be assessed; SBQC with CGA to min assist due to hitting object on Rt side with SBQC when amb. Around track Goal status: Met with hemiwalker; Goal partially met with [facility] -- 11-05-22  4.  Increase gait velocity to >/= 2.4 ft/sec with RW for increased gait efficiency. Baseline: 15.94 secs = 2.06 ft/sec; 15.06 sec = 2.18 ft/sec (11/04/22) Goal status: PROGRESSING  5.  Improve TUG score to </= 17 secs with RW with pt demonstrating correct hand placement with sit to/from stand transfers and safe speed, I.e. not rushing. Baseline: 18.97 secs with RW but not safe, 16.25 sec w/ RW and close SBA (11/04/22) Goal status: MET  6.  Independent with HEP for balance and strengthening exercises. Baseline:  Goal status: Goal met 11-05-22  LONG TERM GOALS: Target date: 12-04-22 (8 weeks)  Pt will perform sit to stand transfer without UE support from standard chair and will stabilize upon initial standing without bracing with legs. Baseline:  bil. UE support needed from mat table; RUE support only no bracing w/ Les (11/04/22): 12-03-22 - balance/stability varies depending on fatigue; pt able to perform without UE support when legs are not fatigue, otherwise, needs to brace with legs for stability upon initial standing Goal status: Partially met 12-03-22   2.  Pt will amb. 350' with quad cane with supervision on flat, even surface for increased community accessibility. Baseline: pt using RW- modified independent household, SBA community amb.;  115' x1  Goal status: Not met 12-03-22 - Goal modified due to c/o low back musc. Tightening up with use of SBQC after approx. 64' distance  3.  Improve Berg balance test score to >/= 36/56 to demo improved standing balance. Baseline: 26/56; regressed to 22/56 in today's session  Goal  status: NOT MET (ongoing)  4.   Increase gait velocity to >/= 3.0 ft/sec with RW for increased gait efficiency. Baseline: 15.94 secs = 2.06 ft/sec; 15.06 sec = 2.18 ft/sec (11/04/22); 13.26 seconds = 2.47 ft/sec (11/19) Goal status: IN PROGRESS - IMPROVING  5.  Improve TUG score to </= 15 secs with hemiwalker (or quad cane) with pt demonstrating correct hand placement with sit to/from stand transfers and safe speed, I.e. not rushing. Baseline: 18.97 secs with RW but not safe; improved to 20.6 seconds with SBQC (minA); 11.95 seconds with 2WW (CGA but cues for slower pacing moving forward for safety)   Goal status: IN PROGRESS - IMPROVING   6.  Transfer floor to stand with UE support on sturdy object or furniture with supervision. Baseline: TBA Goal status: Goal met 12-03-22   NEW (REVISED) LONG TERM GOALS:  TARGET DATE 01-08-23   Pt will perform sit to stand transfer without UE support from standard chair and will stabilize upon initial standing without bracing with legs. Baseline:  bil. UE support needed from mat table; RUE support only no bracing w/ Les (11/04/22): 12-03-22 - balance/stability varies depending on fatigue; pt able to perform without UE support when legs are not fatigue, otherwise, needs to brace with legs for stability upon initial standing Goal status: Partially met 12-03-22 -  ONGOING   2.   Pt will amb. 350' with RW with supervision on flat, even surface for increased community accessibility. Baseline: pt using RW- modified independent household, SBA community amb.;  115' x1  Goal status: Not met 12-03-22 - Goal modified due to c/o low back musc. Tightening up with use of SBQC after approx. 4' distance  3.  Improve Berg balance test score to >/= 36/56 to demo improved standing balance. Baseline: 26/56; regressed to 22/56 in today's session  Goal status: NOT MET (ongoing)  4.  Increase gait velocity to >/= 3.0 ft/sec with RW for increased gait efficiency. Baseline:  15.94 secs = 2.06 ft/sec; 15.06 sec = 2.18 ft/sec (11/04/22); 13.26 seconds = 2.47 ft/sec (11/19) Goal status: IN PROGRESS - IMPROVING  5.  Improve TUG score to </= 15 secs with RW with pt demonstrating correct hand placement with sit to/from stand transfers and safe speed, I.e. not rushing. Baseline: 18.97 secs with RW but not safe; improved to 20.6 seconds with SBQC (minA); 11.95 seconds with 2WW (CGA but cues for slower pacing moving forward for safety)   Goal status: IN PROGRESS - IMPROVING   ASSESSMENT:  CLINICAL IMPRESSION:  PT session focused on LE and core strengthening exercises and standing balance exercises to improve SLS on each leg.  Pt able to maintain sitting balance on SitFit without difficulty with need for intermittent min assist/UE support for sitting balance on green physioball.  Pt had 1 LOB during gait training distance of 115' with SBQC due to LLE scissoring, with need form min assist for recovery of balance.  Continue POC.   OBJECTIVE IMPAIRMENTS: Abnormal gait, decreased activity tolerance, decreased balance, decreased coordination, decreased knowledge of use of DME, decreased strength, decreased safety awareness, and impaired sensation.   ACTIVITY LIMITATIONS: carrying, lifting, bending, standing, squatting, stairs, transfers, locomotion level, and driving  PARTICIPATION LIMITATIONS: meal prep, cleaning, laundry, driving, shopping, community activity, and yard work  PERSONAL FACTORS: Past/current experiences and 1 comorbidity: h/o cervical tumors with s/p cervical fusion  are also affecting patient's functional outcome.   REHAB POTENTIAL: Good  CLINICAL DECISION MAKING: Evolving/moderate complexity  EVALUATION COMPLEXITY: Moderate  PLAN:  PT FREQUENCY:  2x/week  PT DURATION: 5 weeks (10 additional visits per renewal)  PLANNED INTERVENTIONS: Therapeutic exercises, Therapeutic activity, Neuromuscular re-education, Balance training, Gait training, Patient/Family  education, Self Care, Stair training, Orthotic/Fit training, DME instructions, and Aquatic Therapy  PLAN FOR NEXT SESSION:  continue with standing balance, gait with SBQC short distance, </= 50' to minimize low back tightness, LE strengthening, core stabilization  work on gait training with SBQC SHORT distances (50') only:  any LLE strengthening exercises - closed chain, tall kneeling, etc.:  standing balance exercises/activities; functional strengthening (sit to stands, squats), quadruped, prone, Ramps and curbs w/ cane, core strengthening, low back stretches, balance seated on exercise ball, leg press, standing/half kneel core  Reaching forwards to R lower quarter, especially cross-body reaching, challenges core  Kerry Fort, PT [facility] [address] [place], [place] [address] [phone]  12/16/2022, 7:02 PM

## 2022-12-16 ENCOUNTER — Encounter: Payer: Self-pay | Admitting: Physical Therapy

## 2022-12-17 ENCOUNTER — Ambulatory Visit: Payer: Managed Care, Other (non HMO) | Admitting: Physical Therapy

## 2022-12-17 DIAGNOSIS — M6281 Muscle weakness (generalized): Secondary | ICD-10-CM

## 2022-12-17 DIAGNOSIS — R293 Abnormal posture: Secondary | ICD-10-CM

## 2022-12-17 DIAGNOSIS — R208 Other disturbances of skin sensation: Secondary | ICD-10-CM

## 2022-12-17 DIAGNOSIS — R2689 Other abnormalities of gait and mobility: Secondary | ICD-10-CM | POA: Diagnosis not present

## 2022-12-17 DIAGNOSIS — R2681 Unsteadiness on feet: Secondary | ICD-10-CM

## 2022-12-17 DIAGNOSIS — R29898 Other symptoms and signs involving the musculoskeletal system: Secondary | ICD-10-CM

## 2022-12-17 NOTE — Therapy (Signed)
OUTPATIENT PHYSICAL THERAPY NEURO TREATMENT    Patient Name: Larry Riley MRN: [id number] DOB:1980-01-31, 42 y.o., male Today's Date: 12/17/2022   PCP: Corwin Levins., MD REFERRING PROVIDER: Juliann Pares, FNP  END OF SESSION:  PT End of Session - 12/17/22 1439     Visit Number 23    Number of Visits 30   10 visits added per renewal   Date for PT Re-Evaluation 01/08/23    Authorization Type [employer]    Authorization - Number of Visits 60    PT Start Time 1440    PT Stop Time 1530    PT Time Calculation (min) 50 min    Equipment Utilized During Treatment Gait belt    Activity Tolerance Patient tolerated treatment well    Behavior During Therapy WFL for tasks assessed/performed                  Past Medical History:  Diagnosis Date   Anxiety    Depression    GERD (gastroesophageal reflux disease) 01/21/2017   MRSA (methicillin resistant Staphylococcus aureus) 2005   leg   Neurofibromatosis    tumor down spine and brain   Past Surgical History:  Procedure Laterality Date   hydrocephalus     shunt 1999-[place]   NECK SURGERY      tumor removal   SPINE SURGERY  10/2013   c2-t2 fusion  , laminectomy c1-6-- Dr Raynald Kemp-- [place]   VASECTOMY  02/2013   Patient Active Problem List   Diagnosis Date Noted   Skin lesion 10/24/2022   Urinary frequency 10/21/2022   Peripheral edema 08/17/2022   Hypotension 08/17/2022   Urinary incontinence 07/04/2022   Neurofibroma 04/23/2022   Neoplasm of unspecified behavior of bone, soft tissue, and skin 02/16/2022   Acute hearing loss, right 08/04/2021   Hyperglycemia 08/04/2021   Abscess of right foot 08/16/2020   Cellulitis of right foot 08/16/2020   Microhematuria 08/03/2020   Gastroesophageal reflux disease 07/07/2019   External otitis of right ear 03/09/2017   Thumb laceration, right, initial encounter 03/09/2017   Weakness of left side of body 11/19/2016   Stress due to illness of family member 06/10/2016    Syncope 04/23/2016   Onychomycosis 04/23/2016   Impacted ear wax 04/23/2016   Constipation due to opioid therapy 06/13/2014   Chronic pain syndrome 02/21/2014   Allergic reaction 02/20/2014   Neurofibromatosis, type 1 (HCC) 12/14/2013   Status post cervical arthrodesis 11/21/2013   Cervical myelopathy (HCC) 09/21/2013   Encounter for well adult exam with abnormal findings 06/23/2010   Disorder of autonomic nervous system 04/12/2009   Other psoriasis 08/24/2007   Tinea pedis 08/24/2007    ONSET DATE: 04-23-22  REFERRING DIAG:  Diagnosis  D49.2 (ICD-10-CM) - Neoplasm of unspecified behavior of bone, soft tissue, and skin    THERAPY DIAG:  Other abnormalities of gait and mobility  Muscle weakness (generalized)  Unsteadiness on feet  Abnormal posture  Other symptoms and signs involving the musculoskeletal system  Other disturbances of skin sensation  Rationale for Evaluation and Treatment: Rehabilitation  SUBJECTIVE STATEMENT:  "Larry Riley"    Pt reports he is pretty good. Did some similar activities to the boxing done last time with SPT at home, enjoying this.   Pt accompanied by:  spouse, [name]  PERTINENT HISTORY: s/p T11-L1 Laminectomies for intradural tumor resections & L3-L5 laminectomies for intradural tumor resections (hospitalization 04-23-22 - 04-29-22;  Neurofibromatosis 1 diagnosed in 1995  History of foot surgery 08/2020  Patient had metal removed from R foot and infected tissue removed  Neurofibromatosis (CMS/HCC)  Neurofibromatosis, type 1 (von Recklinghausen's disease) (CMS/HCC)  Status post cervical arthrodesis 11/21/2013 - cervical fusion C2-T2, laminectomy C1- C6  PAIN:   Are you having pain? Yes: NPRS scale: 6/10 Pain location: stiffness above the hips Pain description:  stiff Aggravating factors: sitting Relieving factors: stretching Pain was 7-8/10 prior to surgery (on average day) - pt reports pain is much improved since surgery  PRECAUTIONS: Fall  RED FLAGS: Bowel or bladder incontinence: Yes: Improving - pt states he has mostly bladder incontinence at night; little control over starting/stopping  WEIGHT BEARING RESTRICTIONS: No  FALLS: Has patient fallen in last 6 months? Yes. Number of falls 2  LIVING ENVIRONMENT: Lives with: lives with their spouse Lives in: House/apartment Stairs: Yes: External: 2 steps; can reach both Has following equipment at home: Dan Humphreys - 2 wheeled, Wheelchair (manual), Tour manager, and hemiwalker  PLOF: Independent with basic ADLs, Independent with household mobility without device, Independent with community mobility without device, and Independent with transfers; pt drove prior to surgery on 04-23-22 (has not driven since)  PATIENT GOALS: "walk without use of device, as I was prior to surgery"; work on balance  OBJECTIVE:   DIAGNOSTIC FINDINGS: N/A  COGNITION: Overall cognitive status: Within functional limits for tasks assessed   SENSATION: Impaired in bil. LE's   COORDINATION: Decreased in bil. LE's wi  POSTURE: forward head, decreased thoracic kyphosis, and head is laterally flexed to Lt side  LOWER EXTREMITY ROM:   WFL's except for Lt ankle ROM - pt has worn AFO for > 4 yrs Decreased Rt active dorsiflexion and plantarflexion due to weakness  LOWER EXTREMITY MMT:    MMT Right Eval Left Eval  Hip flexion 5 5  Hip extension    Hip abduction    Hip adduction    Hip internal rotation    Hip external rotation    Knee flexion 4+ (seated position) 4 (seated position)  Knee extension 5 5  Ankle dorsiflexion 3- 0-1  Ankle plantarflexion    Ankle inversion    Ankle eversion    (Blank rows = not tested)  BED MOBILITY:  Modified independent   TRANSFERS: Assistive device utilized: Environmental consultant - 2  wheeled  Sit to stand: Modified independence short distances; SBA community amb. With RW Stand to sit: Modified independence  CURB: TBA  STAIRS:  TBA  GAIT: Gait pattern: step through pattern, decreased ankle dorsiflexion- Right, decreased ankle dorsiflexion- Left, genu recurvatum- Left, and trunk rotated posterior- Left Distance walked: 42' Assistive device utilized: Environmental consultant - 2 wheeled Level of assistance: SBA Comments: AFO worn on LLE - carbon fiber with an anterior guard added (appears to be custom fabricated - pt states he obtained this brace approx. 4 yrs ago)  FUNCTIONAL TESTS:  Timed up and go (TUG): 18.97 secs with RW but pt was not safe - pt was instructed to ambulate at his normal speed but ambulated at fast pace and was unsteady with turns so score is not totally accurate - CGA was provided  for safety 10 meter walk test: 15.94 secs with RW = 2.06 ft/sec Berg Balance Scale: 26/56    PATIENT SURVEYS:  N/A for his diagnosis - s/p laminectomies for tumor resections     TODAY'S TREATMENT:    PT changes tennis balls on RW  TherAct Transfer from mat table to floor CGA  Half kneeling on floor activity, CGA>Mod A: Attempted LLE up first w/o UE support and airex under R knee increased difficulty, pt attempting to transition from quadruped to half-kneel Add UE support on mat table and instructed to transition from tall kneel to half kneel putting RLE up Pt is more stable and able to maintain balance for 30 seconds Attempted UE support on mat table tall kneel to half kneel w/ LLE up Pt very unstable, needing seated rest break 2nd try for LLE up in half-kneel Pt points L foot out at 10 o'clock angle, finding increased stability and able to maintain balance for 30 seconds  Half kneel ball roll activity, CGA throughout: Half kneel ball roll: RLE up, pt completes 1 pass before posterior LOB where he is slowly lowered to mat on floor to seated position to rest Tall kneel ball  roll: pt reports activity is easy Half kneel ball roll: LLE up w/ foot externally rotated out, LUE support on mat, repetitions until fatigue Half kneel ball: RLE up, LUE support on mat, repetitions until fatigue   X8 repetitions of sit to stands from mat table w/ air ex under bottom w/ emphasis on good form, CGA Queues to not lock knees out into extension, weight shift forward, find balance in standing  NMR Cone weaving 2 sets of 2 laps x6 cones w/ pt's personal SBQC, CGA Reports low back is feeling good at completion of 1st set Pt very unsteady throughout activity, especially when cane is on inside of turning point Verbally cued for increased hip rotation when weaving, moderate improvement  Standing, feet romberg, eyes open horizontal head turns then vertical head turns to fatigue, CGA Pt does report some mild dizziness with head turns  Standing, feet staggered RLE in front x30 seconds, CGA Attempted LLE staggered in front, pt w/ LOB, CGA>Mod A assisted to seated, complaint of R sided back pain   PATIENT EDUCATION:  Education details: cont HEP Person educated: Patient and Spouse Education method: Explanation Education comprehension: verbalized understanding  HOME EXERCISE PROGRAM:   Access Code: [email or web address] URL: [email or web address] Date: 11/03/2022 Prepared by: Maebelle Munroe  Exercises - Prone Knee Flexion AROM  - 1 x daily - 7 x weekly - 1 sets - 10 reps - 3 sec hold - Beginner Clam  - 1 x daily - 7 x weekly - 1 sets - 10 reps - Hooklying Isometric Clamshell  - 1 x daily - 7 x weekly - 1 sets - 10 reps - 3 sec hold - SEATED SIDE PLANK - LEFT SIDE  - 1 x daily - 7 x weekly - 3 sets - 10 reps   Access Code: [email or web address] URL: [email or web address] Date: 10/13/2022 Prepared by: Maryruth Eve  Exercises - Sit to Stand  - 1 x daily - 7 x weekly - 2-3 sets - 10 reps - Sit to Stand with Resistance Around Legs  - 1 x daily - 7 x weekly - 2-3 sets - 5  reps - Standing March with Unilateral Counter Support  - 1 x daily - 7 x weekly - 3 sets - 20 reps - Standing 3-Way Leg Reach with Resistance at Ankles and  Counter Support  - 1 x daily - 7 x weekly - 2-3 sets - 10 reps  Walking with hemiwalker with gait belt and spouse supervision ~1x day  [employer] [id number]  GOALS: Goals reviewed with patient? Yes  SHORT TERM GOALS: Target date: 11-06-22 (4 weeks)   Pt will perform sit to stand transfer with 1 UE support only from standard chair and will stabilize upon initial standing without bracing with legs. Baseline:  bil. UE support needed from mat table, RUE support only no bracing w/ Les (11/04/22) Goal status: MET  2.  Improve Berg balance test score to >/= 31/56 to demo improved standing balance. Baseline: 26/56;  score 32/56 on 11-05-22 Goal status: GOAL MET  3.  Pt will amb. 115' with hemiwalker with CGA on flat, even surface. Baseline: gait with hemiwalker to be assessed; SBQC with CGA to min assist due to hitting object on Rt side with SBQC when amb. Around track Goal status: Met with hemiwalker; Goal partially met with [facility] -- 11-05-22  4.  Increase gait velocity to >/= 2.4 ft/sec with RW for increased gait efficiency. Baseline: 15.94 secs = 2.06 ft/sec; 15.06 sec = 2.18 ft/sec (11/04/22) Goal status: PROGRESSING  5.  Improve TUG score to </= 17 secs with RW with pt demonstrating correct hand placement with sit to/from stand transfers and safe speed, I.e. not rushing. Baseline: 18.97 secs with RW but not safe, 16.25 sec w/ RW and close SBA (11/04/22) Goal status: MET  6.  Independent with HEP for balance and strengthening exercises. Baseline:  Goal status: Goal met 11-05-22  LONG TERM GOALS: Target date: 12-04-22 (8 weeks)  Pt will perform sit to stand transfer without UE support from standard chair and will stabilize upon initial standing without bracing with legs. Baseline:  bil. UE support needed from mat table; RUE  support only no bracing w/ Les (11/04/22): 12-03-22 - balance/stability varies depending on fatigue; pt able to perform without UE support when legs are not fatigue, otherwise, needs to brace with legs for stability upon initial standing Goal status: Partially met 12-03-22   2.  Pt will amb. 350' with quad cane with supervision on flat, even surface for increased community accessibility. Baseline: pt using RW- modified independent household, SBA community amb.;  115' x1  Goal status: Not met 12-03-22 - Goal modified due to c/o low back musc. Tightening up with use of SBQC after approx. 15' distance  3.  Improve Berg balance test score to >/= 36/56 to demo improved standing balance. Baseline: 26/56; regressed to 22/56 in today's session  Goal status: NOT MET (ongoing)  4.   Increase gait velocity to >/= 3.0 ft/sec with RW for increased gait efficiency. Baseline: 15.94 secs = 2.06 ft/sec; 15.06 sec = 2.18 ft/sec (11/04/22); 13.26 seconds = 2.47 ft/sec (11/19) Goal status: IN PROGRESS - IMPROVING  5.  Improve TUG score to </= 15 secs with hemiwalker (or quad cane) with pt demonstrating correct hand placement with sit to/from stand transfers and safe speed, I.e. not rushing. Baseline: 18.97 secs with RW but not safe; improved to 20.6 seconds with SBQC (minA); 11.95 seconds with 2WW (CGA but cues for slower pacing moving forward for safety)   Goal status: IN PROGRESS - IMPROVING   6.  Transfer floor to stand with UE support on sturdy object or furniture with supervision. Baseline: TBA Goal status: Goal met 12-03-22   NEW (REVISED) LONG TERM GOALS:  TARGET DATE 01-08-23   Pt will perform sit  to stand transfer without UE support from standard chair and will stabilize upon initial standing without bracing with legs. Baseline:  bil. UE support needed from mat table; RUE support only no bracing w/ Les (11/04/22): 12-03-22 - balance/stability varies depending on fatigue; pt able to perform without  UE support when legs are not fatigue, otherwise, needs to brace with legs for stability upon initial standing Goal status: Partially met 12-03-22 - ONGOING   2.   Pt will amb. 350' with RW with supervision on flat, even surface for increased community accessibility. Baseline: pt using RW- modified independent household, SBA community amb.;  115' x1  Goal status: Not met 12-03-22 - Goal modified due to c/o low back musc. Tightening up with use of SBQC after approx. 72' distance  3.  Improve Berg balance test score to >/= 36/56 to demo improved standing balance. Baseline: 26/56; regressed to 22/56 in today's session  Goal status: NOT MET (ongoing)  4.  Increase gait velocity to >/= 3.0 ft/sec with RW for increased gait efficiency. Baseline: 15.94 secs = 2.06 ft/sec; 15.06 sec = 2.18 ft/sec (11/04/22); 13.26 seconds = 2.47 ft/sec (11/19) Goal status: IN PROGRESS - IMPROVING  5.  Improve TUG score to </= 15 secs with RW with pt demonstrating correct hand placement with sit to/from stand transfers and safe speed, I.e. not rushing. Baseline: 18.97 secs with RW but not safe; improved to 20.6 seconds with SBQC (minA); 11.95 seconds with 2WW (CGA but cues for slower pacing moving forward for safety)   Goal status: IN PROGRESS - IMPROVING   ASSESSMENT:  CLINICAL IMPRESSION:  Emphasis of skilled PT session on half-kneeling activities, ambulation with turns, standing balance challenges, and sit to stand quality. Pt with difficulty getting into half-kneeling position, L>R, requiring UE support and rest breaks to be able to complete activities in this position. Pt with imbalance with dynamic and standing balance challenges and using knee extension to stand from mat table throughout session, practiced sit to stands w/ pt reporting good understanding of what a quality sit to stand looks and feels like. Continue POC.   OBJECTIVE IMPAIRMENTS: Abnormal gait, decreased activity tolerance, decreased balance,  decreased coordination, decreased knowledge of use of DME, decreased strength, decreased safety awareness, and impaired sensation.   ACTIVITY LIMITATIONS: carrying, lifting, bending, standing, squatting, stairs, transfers, locomotion level, and driving  PARTICIPATION LIMITATIONS: meal prep, cleaning, laundry, driving, shopping, community activity, and yard work  PERSONAL FACTORS: Past/current experiences and 1 comorbidity: h/o cervical tumors with s/p cervical fusion  are also affecting patient's functional outcome.   REHAB POTENTIAL: Good  CLINICAL DECISION MAKING: Evolving/moderate complexity  EVALUATION COMPLEXITY: Moderate  PLAN:  PT FREQUENCY:  2x/week  PT DURATION: 5 weeks (10 additional visits per renewal)  PLANNED INTERVENTIONS: Therapeutic exercises, Therapeutic activity, Neuromuscular re-education, Balance training, Gait training, Patient/Family education, Self Care, Stair training, Orthotic/Fit training, DME instructions, and Aquatic Therapy  PLAN FOR NEXT SESSION:  continue with standing balance, gait with SBQC short distance, </= 50' to minimize low back tightness, LE strengthening, core stabilization  work on gait training with SBQC SHORT distances (50') only:  any LLE strengthening exercises - closed chain, tall kneeling, etc.:  standing balance exercises/activities; functional strengthening (sit to stands, squats), quadruped, prone, Ramps and curbs w/ cane, core strengthening, low back stretches, balance seated on exercise ball, leg press, standing/half kneel core  Reaching forwards to R lower quarter, especially cross-body reaching, challenges core  Beverely Low, SPT   12/17/2022, 3:48 PM

## 2022-12-22 ENCOUNTER — Ambulatory Visit: Payer: Managed Care, Other (non HMO) | Admitting: Physical Therapy

## 2022-12-22 NOTE — Therapy (Incomplete)
OUTPATIENT PHYSICAL THERAPY NEURO TREATMENT    Patient Name: Larry Riley MRN: [id number] DOB:1980/11/23, 42 y.o., male Today's Date: 12/22/2022   PCP: Larry Riley., MD REFERRING PROVIDER: Juliann Pares, FNP  END OF SESSION:         Past Medical History:  Diagnosis Date   Anxiety    Depression    GERD (gastroesophageal reflux disease) 01/21/2017   MRSA (methicillin resistant Staphylococcus aureus) 2005   leg   Neurofibromatosis    tumor down spine and brain   Past Surgical History:  Procedure Laterality Date   hydrocephalus     shunt 1999-[place]   NECK SURGERY      tumor removal   SPINE SURGERY  10/2013   c2-t2 fusion  , laminectomy c1-6-- Dr Raynald Kemp-- [place]   VASECTOMY  02/2013   Patient Active Problem List   Diagnosis Date Noted   Skin lesion 10/24/2022   Urinary frequency 10/21/2022   Peripheral edema 08/17/2022   Hypotension 08/17/2022   Urinary incontinence 07/04/2022   Neurofibroma 04/23/2022   Neoplasm of unspecified behavior of bone, soft tissue, and skin 02/16/2022   Acute hearing loss, right 08/04/2021   Hyperglycemia 08/04/2021   Abscess of right foot 08/16/2020   Cellulitis of right foot 08/16/2020   Microhematuria 08/03/2020   Gastroesophageal reflux disease 07/07/2019   External otitis of right ear 03/09/2017   Thumb laceration, right, initial encounter 03/09/2017   Weakness of left side of body 11/19/2016   Stress due to illness of family member 06/10/2016   Syncope 04/23/2016   Onychomycosis 04/23/2016   Impacted ear wax 04/23/2016   Constipation due to opioid therapy 06/13/2014   Chronic pain syndrome 02/21/2014   Allergic reaction 02/20/2014   Neurofibromatosis, type 1 (HCC) 12/14/2013   Status post cervical arthrodesis 11/21/2013   Cervical myelopathy (HCC) 09/21/2013   Encounter for well adult exam with abnormal findings 06/23/2010   Disorder of autonomic nervous system 04/12/2009   Other psoriasis 08/24/2007    Tinea pedis 08/24/2007    ONSET DATE: 04-23-22  REFERRING DIAG:  Diagnosis  D49.2 (ICD-10-CM) - Neoplasm of unspecified behavior of bone, soft tissue, and skin    THERAPY DIAG:  No diagnosis found.  Rationale for Evaluation and Treatment: Rehabilitation                                                                                                                                                                                            SUBJECTIVE STATEMENT:  "Larry Riley"   ***  Pt accompanied by:  spouse, Larry Riley  PERTINENT HISTORY: s/p T11-L1 Laminectomies for intradural tumor resections & L3-L5 laminectomies  for intradural tumor resections (hospitalization 04-23-22 - 04-29-22;  Neurofibromatosis 1 diagnosed in 1995  History of foot surgery 08/2020  Patient had metal removed from R foot and infected tissue removed  Neurofibromatosis (CMS/HCC)  Neurofibromatosis, type 1 (von Recklinghausen's disease) (CMS/HCC)  Status post cervical arthrodesis 11/21/2013 - cervical fusion C2-T2, laminectomy C1- C6  PAIN:   Are you having pain? Yes: NPRS scale: 6/10 Pain location: stiffness above the hips Pain description: stiff Aggravating factors: sitting Relieving factors: stretching Pain was 7-8/10 prior to surgery (on average day) - pt reports pain is much improved since surgery  PRECAUTIONS: Fall  RED FLAGS: Bowel or bladder incontinence: Yes: Improving - pt states he has mostly bladder incontinence at night; little control over starting/stopping  WEIGHT BEARING RESTRICTIONS: No  FALLS: Has patient fallen in last 6 months? Yes. Number of falls 2  LIVING ENVIRONMENT: Lives with: lives with their spouse Lives in: House/apartment Stairs: Yes: External: 2 steps; can reach both Has following equipment at home: Dan Humphreys - 2 wheeled, Wheelchair (manual), Tour manager, and hemiwalker  PLOF: Independent with basic ADLs, Independent with household mobility without device, Independent with community  mobility without device, and Independent with transfers; pt drove prior to surgery on 04-23-22 (has not driven since)  PATIENT GOALS: "walk without use of device, as I was prior to surgery"; work on balance  OBJECTIVE:   DIAGNOSTIC FINDINGS: N/A  COGNITION: Overall cognitive status: Within functional limits for tasks assessed   SENSATION: Impaired in bil. LE's   COORDINATION: Decreased in bil. LE's wi  POSTURE: forward head, decreased thoracic kyphosis, and head is laterally flexed to Lt side  LOWER EXTREMITY ROM:   WFL's except for Lt ankle ROM - pt has worn AFO for > 4 yrs Decreased Rt active dorsiflexion and plantarflexion due to weakness  LOWER EXTREMITY MMT:    MMT Right Eval Left Eval  Hip flexion 5 5  Hip extension    Hip abduction    Hip adduction    Hip internal rotation    Hip external rotation    Knee flexion 4+ (seated position) 4 (seated position)  Knee extension 5 5  Ankle dorsiflexion 3- 0-1  Ankle plantarflexion    Ankle inversion    Ankle eversion    (Blank rows = not tested)  BED MOBILITY:  Modified independent   TRANSFERS: Assistive device utilized: Environmental consultant - 2 wheeled  Sit to stand: Modified independence short distances; SBA community amb. With RW Stand to sit: Modified independence  CURB: TBA  STAIRS:  TBA  GAIT: Gait pattern: step through pattern, decreased ankle dorsiflexion- Right, decreased ankle dorsiflexion- Left, genu recurvatum- Left, and trunk rotated posterior- Left Distance walked: 67' Assistive device utilized: Environmental consultant - 2 wheeled Level of assistance: SBA Comments: AFO worn on LLE - carbon fiber with an anterior guard added (appears to be custom fabricated - pt states he obtained this brace approx. 4 yrs ago)  FUNCTIONAL TESTS:  Timed up and go (TUG): 18.97 secs with RW but pt was not safe - pt was instructed to ambulate at his normal speed but ambulated at fast pace and was unsteady with turns so score is not totally accurate  - CGA was provided for safety 10 meter walk test: 15.94 secs with RW = 2.06 ft/sec Berg Balance Scale: 26/56    PATIENT SURVEYS:  N/A for his diagnosis - s/p laminectomies for tumor resections     TODAY'S TREATMENT:    TherAct ***  NMR ***   PATIENT  EDUCATION:  Education details: cont HEP*** Person educated: Patient and Spouse Education method: Explanation Education comprehension: verbalized understanding  HOME EXERCISE PROGRAM:   Access Code: [email or web address] URL: [email or web address] Date: 11/03/2022 Prepared by: Maebelle Munroe  Exercises - Prone Knee Flexion AROM  - 1 x daily - 7 x weekly - 1 sets - 10 reps - 3 sec hold - Beginner Clam  - 1 x daily - 7 x weekly - 1 sets - 10 reps - Hooklying Isometric Clamshell  - 1 x daily - 7 x weekly - 1 sets - 10 reps - 3 sec hold - SEATED SIDE PLANK - LEFT SIDE  - 1 x daily - 7 x weekly - 3 sets - 10 reps   Access Code: [email or web address] URL: [email or web address] Date: 10/13/2022 Prepared by: Maryruth Eve  Exercises - Sit to Stand  - 1 x daily - 7 x weekly - 2-3 sets - 10 reps - Sit to Stand with Resistance Around Legs  - 1 x daily - 7 x weekly - 2-3 sets - 5 reps - Standing March with Unilateral Counter Support  - 1 x daily - 7 x weekly - 3 sets - 20 reps - Standing 3-Way Leg Reach with Resistance at Ankles and Counter Support  - 1 x daily - 7 x weekly - 2-3 sets - 10 reps  Walking with hemiwalker with gait belt and spouse supervision ~1x day  Medbridge [email or web address]  GOALS: Goals reviewed with patient? Yes  SHORT TERM GOALS: Target date: 11-06-22 (4 weeks)   Pt will perform sit to stand transfer with 1 UE support only from standard chair and will stabilize upon initial standing without bracing with legs. Baseline:  bil. UE support needed from mat table, RUE support only no bracing w/ Les (11/04/22) Goal status: MET  2.  Improve Berg balance test score to >/= 31/56 to demo improved standing  balance. Baseline: 26/56;  score 32/56 on 11-05-22 Goal status: GOAL MET  3.  Pt will amb. 115' with hemiwalker with CGA on flat, even surface. Baseline: gait with hemiwalker to be assessed; SBQC with CGA to min assist due to hitting object on Rt side with SBQC when amb. Around track Goal status: Met with hemiwalker; Goal partially met with [facility] -- 11-05-22  4.  Increase gait velocity to >/= 2.4 ft/sec with RW for increased gait efficiency. Baseline: 15.94 secs = 2.06 ft/sec; 15.06 sec = 2.18 ft/sec (11/04/22) Goal status: PROGRESSING  5.  Improve TUG score to </= 17 secs with RW with pt demonstrating correct hand placement with sit to/from stand transfers and safe speed, I.e. not rushing. Baseline: 18.97 secs with RW but not safe, 16.25 sec w/ RW and close SBA (11/04/22) Goal status: MET  6.  Independent with HEP for balance and strengthening exercises. Baseline:  Goal status: Goal met 11-05-22  LONG TERM GOALS: Target date: 12-04-22 (8 weeks)  Pt will perform sit to stand transfer without UE support from standard chair and will stabilize upon initial standing without bracing with legs. Baseline:  bil. UE support needed from mat table; RUE support only no bracing w/ Les (11/04/22): 12-03-22 - balance/stability varies depending on fatigue; pt able to perform without UE support when legs are not fatigue, otherwise, needs to brace with legs for stability upon initial standing Goal status: Partially met 12-03-22   2.  Pt will amb. 350' with quad cane with supervision on flat, even surface for increased community accessibility. Baseline: pt using RW- modified independent household,  SBA community amb.;  115' x1  Goal status: Not met 12-03-22 - Goal modified due to c/o low back musc. Tightening up with use of SBQC after approx. 40' distance  3.  Improve Berg balance test score to >/= 36/56 to demo improved standing balance. Baseline: 26/56; regressed to 22/56 in today's session  Goal  status: NOT MET (ongoing)  4.   Increase gait velocity to >/= 3.0 ft/sec with RW for increased gait efficiency. Baseline: 15.94 secs = 2.06 ft/sec; 15.06 sec = 2.18 ft/sec (11/04/22); 13.26 seconds = 2.47 ft/sec (11/19) Goal status: IN PROGRESS - IMPROVING  5.  Improve TUG score to </= 15 secs with hemiwalker (or quad cane) with pt demonstrating correct hand placement with sit to/from stand transfers and safe speed, I.e. not rushing. Baseline: 18.97 secs with RW but not safe; improved to 20.6 seconds with SBQC (minA); 11.95 seconds with 2WW (CGA but cues for slower pacing moving forward for safety)   Goal status: IN PROGRESS - IMPROVING   6.  Transfer floor to stand with UE support on sturdy object or furniture with supervision. Baseline: TBA Goal status: Goal met 12-03-22   NEW (REVISED) LONG TERM GOALS:  TARGET DATE 01-08-23   Pt will perform sit to stand transfer without UE support from standard chair and will stabilize upon initial standing without bracing with legs. Baseline:  bil. UE support needed from mat table; RUE support only no bracing w/ Les (11/04/22): 12-03-22 - balance/stability varies depending on fatigue; pt able to perform without UE support when legs are not fatigue, otherwise, needs to brace with legs for stability upon initial standing Goal status: Partially met 12-03-22 - ONGOING   2.   Pt will amb. 350' with RW with supervision on flat, even surface for increased community accessibility. Baseline: pt using RW- modified independent household, SBA community amb.;  115' x1  Goal status: Not met 12-03-22 - Goal modified due to c/o low back musc. Tightening up with use of SBQC after approx. 46' distance  3.  Improve Berg balance test score to >/= 36/56 to demo improved standing balance. Baseline: 26/56; regressed to 22/56 in today's session  Goal status: NOT MET (ongoing)  4.  Increase gait velocity to >/= 3.0 ft/sec with RW for increased gait efficiency. Baseline:  15.94 secs = 2.06 ft/sec; 15.06 sec = 2.18 ft/sec (11/04/22); 13.26 seconds = 2.47 ft/sec (11/19) Goal status: IN PROGRESS - IMPROVING  5.  Improve TUG score to </= 15 secs with RW with pt demonstrating correct hand placement with sit to/from stand transfers and safe speed, I.e. not rushing. Baseline: 18.97 secs with RW but not safe; improved to 20.6 seconds with SBQC (minA); 11.95 seconds with 2WW (CGA but cues for slower pacing moving forward for safety)   Goal status: IN PROGRESS - IMPROVING   ASSESSMENT:  CLINICAL IMPRESSION:  Emphasis of skilled PT session on *** Continue POC.   OBJECTIVE IMPAIRMENTS: Abnormal gait, decreased activity tolerance, decreased balance, decreased coordination, decreased knowledge of use of DME, decreased strength, decreased safety awareness, and impaired sensation.   ACTIVITY LIMITATIONS: carrying, lifting, bending, standing, squatting, stairs, transfers, locomotion level, and driving  PARTICIPATION LIMITATIONS: meal prep, cleaning, laundry, driving, shopping, community activity, and yard work  PERSONAL FACTORS: Past/current experiences and 1 comorbidity: h/o cervical tumors with s/p cervical fusion  are also affecting patient's functional outcome.   REHAB POTENTIAL: Good  CLINICAL DECISION MAKING: Evolving/moderate complexity  EVALUATION COMPLEXITY: Moderate  PLAN:  PT FREQUENCY:  2x/week  PT DURATION: 5 weeks (10 additional visits per renewal)  PLANNED INTERVENTIONS: Therapeutic exercises, Therapeutic activity, Neuromuscular re-education, Balance training, Gait training, Patient/Family education, Self Care, Stair training, Orthotic/Fit training, DME instructions, and Aquatic Therapy  PLAN FOR NEXT SESSION:  continue with standing balance, gait with SBQC short distance, </= 50' to minimize low back tightness, LE strengthening, core stabilization  work on gait training with SBQC SHORT distances (50') only:  any LLE strengthening exercises - closed  chain, tall kneeling, etc.:  standing balance exercises/activities; functional strengthening (sit to stands, squats), quadruped, prone, Ramps and curbs w/ cane, core strengthening, low back stretches, balance seated on exercise ball, leg press, standing/half kneel core  Reaching forwards to R lower quarter, especially cross-body reaching, challenges core***  Peter Congo, PT, DPT, CSRS    12/22/2022, 8:30 AM

## 2022-12-24 ENCOUNTER — Ambulatory Visit: Payer: Managed Care, Other (non HMO) | Admitting: Physical Therapy

## 2022-12-24 ENCOUNTER — Encounter: Payer: Self-pay | Admitting: Behavioral Health

## 2022-12-24 ENCOUNTER — Ambulatory Visit (INDEPENDENT_AMBULATORY_CARE_PROVIDER_SITE_OTHER): Payer: 59 | Admitting: Behavioral Health

## 2022-12-24 DIAGNOSIS — M6281 Muscle weakness (generalized): Secondary | ICD-10-CM

## 2022-12-24 DIAGNOSIS — R2681 Unsteadiness on feet: Secondary | ICD-10-CM

## 2022-12-24 DIAGNOSIS — R2689 Other abnormalities of gait and mobility: Secondary | ICD-10-CM | POA: Diagnosis not present

## 2022-12-24 DIAGNOSIS — F411 Generalized anxiety disorder: Secondary | ICD-10-CM | POA: Diagnosis not present

## 2022-12-24 NOTE — Progress Notes (Signed)
[facility] Counselor/Therapist Progress Note  Patient ID: Benjiman Hodgeman, MRN: [id number],    Date: 12/24/2022  Time Spent: 57 minutes from 1 PM until 1:57 PM this session was held via video teletherapy. The patient consented to the video teletherapy and was located in his home office during this session.  He is aware it is the responsibility of the patient to secure confidentiality on his end of the session. The provider was in a private home office for the duration of this session.      Treatment Type: Individual Therapy  Reported Symptoms: Anxiety/stress  Mental Status Exam: Appearance:  Casual     Behavior: Appropriate  Motor: Normal  Speech/Language:  Normal Rate  Affect: Appropriate  Mood: normal  Thought process: normal  Thought content:   WNL  Sensory/Perceptual disturbances:   WNL  Orientation: oriented to person, place, time/date, situation, day of week, and month of year  Attention: Good  Concentration: Good  Memory: WNL  Fund of knowledge:  Good  Insight:   Good  Judgment:  Good  Impulse Control: Good   Risk Assessment: Danger to Self:  No Self-injurious Behavior: No Danger to Others: No Duty to Warn:no Physical Aggression / Violence:No  Access to Firearms a concern: No  Gang Involvement:No   Subjective: The past couple of weeks have been frustrating for the patient.  A room in a group home became available for his father to try out and he liked it initially but then started to question whether he wanted to stay or not.  He is still there and the patient is encouraging him to stay there.  It based on his phone conversations and reports from his father social worker they are seeing more of cognitive changes which is concerning for the patient.  He is trying to speak to the father as often as he can while still setting boundaries and encouraged him to stay there where he will get good care.  His father has not been taking his medication consistently.   He has reached out to his sister and brother about the recent developments with his father and his sister has responded through text messages but they have missed each other trying to talk recently.  He would like for her to be able to lay eyes on the father's situation although there is not a great history between his sister and father.  And has created some anxiety but he knows that he is doing as much as he can living in a different state.  He is trying to reach out to his father's doctors to see about an appointment with a neurologist so they can get a more definitive diagnosis so that no more what they are dealing with.  The patient continues to work hard in physical therapy.  He still is walking with a walker outside of therapy but walking with a cane while in physical therapy and is limited what he can do but is still progressing.  They recognize that there are certain muscles they are not engaging yet like they should so they are targeting those and he is optimistic.  He is getting out more.  He is having coffee with a friend this weekend which she has not done in a long time.  He and his wife are going to take his mother-in-law to a movie soon which he has not done in a long time.  One of the other physical therapist has asked him to speak to a group of  students at her local university about what he has been through and how he has benefited from treatment.  He is excited about the opportunity Nadar to have been asked.  I encouraged especially in dealing with his father's situation to use the coping skills as we have discussed him and as he has practiced them.  Hurts He does contract for safety having no thoughts of hurting himself or anyone else. Interventions: Cognitive Behavioral Therapy  Diagnosis: Generalized anxiety disorder  Plan: I will meet with the patient every 2 to 3 weeks via care agility.  Treatment plan: We will use cognitive behavioral therapy as well as dialectical behavior  therapy to help the patient reduce anxiety/stress primarily related to medical issues.  The goal is to reduce anxiety by 50% with a target date of February 12, 2023.  Goals for reducing anxiety include helping him manage thoughts and worrisome thinking contributing to feelings of anxiety as well as recognition of those thoughts.  We will look to resolve any core conflicts outside of the medical and health issues that are contributing to anxiety including family situations and relationships.  In addition to that we will identify causes for anxiety and explore ways to lower it as well as improve his ability to manage anxiety symptoms and better handle stress.  Interventions will be to provide education about anxiety and stress to help him identify causes and symptoms.  Introduce coping skills and problem solution skills to manage anxiety including DBT distress tolerance and mindfulness skills.  We will also use cognitive behavioral therapy to identify and change anxiety provoking thought and behavior patterns. Progress: 35% [place], [facility]                  [place], [facility]               [place], [facility]               [place], [facility]               [place], [facility]               [place], [facility]               [place], [facility]

## 2022-12-24 NOTE — Therapy (Signed)
OUTPATIENT PHYSICAL THERAPY NEURO TREATMENT    Patient Name: Larry Riley MRN: [id number] DOB:08-04-1980, 42 y.o., male Today's Date: 12/25/2022   PCP: Larry Levins., MD REFERRING PROVIDER: Juliann Pares, FNP  END OF SESSION:  PT End of Session - 12/25/22 1544     Visit Number 24    Number of Visits 30   10 visits added per renewal   Date for PT Re-Evaluation 01/08/23    Authorization Type [employer]    Authorization - Visit Number 24    Authorization - Number of Visits 60    PT Start Time 1450    PT Stop Time 1535    PT Time Calculation (min) 45 min    Equipment Utilized During Treatment Gait belt    Activity Tolerance Patient tolerated treatment well    Behavior During Therapy WFL for tasks assessed/performed                   Past Medical History:  Diagnosis Date   Anxiety    Depression    GERD (gastroesophageal reflux disease) 01/21/2017   MRSA (methicillin resistant Staphylococcus aureus) 2005   leg   Neurofibromatosis    tumor down spine and brain   Past Surgical History:  Procedure Laterality Date   hydrocephalus     shunt 1999-[place]   NECK SURGERY      tumor removal   SPINE SURGERY  10/2013   c2-t2 fusion  , laminectomy c1-6-- Larry Riley-- [place]   VASECTOMY  02/2013   Patient Active Problem List   Diagnosis Date Noted   Skin lesion 10/24/2022   Urinary frequency 10/21/2022   Peripheral edema 08/17/2022   Hypotension 08/17/2022   Urinary incontinence 07/04/2022   Neurofibroma 04/23/2022   Neoplasm of unspecified behavior of bone, soft tissue, and skin 02/16/2022   Acute hearing loss, right 08/04/2021   Hyperglycemia 08/04/2021   Abscess of right foot 08/16/2020   Cellulitis of right foot 08/16/2020   Microhematuria 08/03/2020   Gastroesophageal reflux disease 07/07/2019   External otitis of right ear 03/09/2017   Thumb laceration, right, initial encounter 03/09/2017   Weakness of left side of body 11/19/2016   Stress due to  illness of family member 06/10/2016   Syncope 04/23/2016   Onychomycosis 04/23/2016   Impacted ear wax 04/23/2016   Constipation due to opioid therapy 06/13/2014   Chronic pain syndrome 02/21/2014   Allergic reaction 02/20/2014   Neurofibromatosis, type 1 (HCC) 12/14/2013   Status post cervical arthrodesis 11/21/2013   Cervical myelopathy (HCC) 09/21/2013   Encounter for well adult exam with abnormal findings 06/23/2010   Disorder of autonomic nervous system 04/12/2009   Other psoriasis 08/24/2007   Tinea pedis 08/24/2007    ONSET DATE: 04-23-22  REFERRING DIAG:  Diagnosis  D49.2 (ICD-10-CM) - Neoplasm of unspecified behavior of bone, soft tissue, and skin    THERAPY DIAG:  Other abnormalities of gait and mobility  Muscle weakness (generalized)  Unsteadiness on feet  Rationale for Evaluation and Treatment: Rehabilitation  SUBJECTIVE STATEMENT:  "Larry Riley"    Pt reports he had to cancel PT appt on Tuesday this week due to not feeling well - says he was having nerve pain; is feeling better today  Pt accompanied by:  spouse, Larry Riley  PERTINENT HISTORY: s/p T11-L1 Laminectomies for intradural tumor resections & L3-L5 laminectomies for intradural tumor resections (hospitalization 04-23-22 - 04-29-22;  Neurofibromatosis 1 diagnosed in 1995  History of foot surgery 08/2020  Patient had metal removed from R foot and infected tissue removed  Neurofibromatosis (CMS/HCC)  Neurofibromatosis, type 1 (von Recklinghausen's disease) (CMS/HCC)  Status post cervical arthrodesis 11/21/2013 - cervical fusion C2-T2, laminectomy C1- C6  PAIN:   Are you having pain? Yes: NPRS scale: 6/10 Pain location: stiffness above the hips Pain description: stiff Aggravating factors: sitting Relieving factors: stretching Pain  was 7-8/10 prior to surgery (on average day) - pt reports pain is much improved since surgery  PRECAUTIONS: Fall  RED FLAGS: Bowel or bladder incontinence: Yes: Improving - pt states he has mostly bladder incontinence at night; little control over starting/stopping  WEIGHT BEARING RESTRICTIONS: No  FALLS: Has patient fallen in last 6 months? Yes. Number of falls 2  LIVING ENVIRONMENT: Lives with: lives with their spouse Lives in: House/apartment Stairs: Yes: External: 2 steps; can reach both Has following equipment at home: Dan Humphreys - 2 wheeled, Wheelchair (manual), Tour manager, and hemiwalker  PLOF: Independent with basic ADLs, Independent with household mobility without device, Independent with community mobility without device, and Independent with transfers; pt drove prior to surgery on 04-23-22 (has not driven since)  PATIENT GOALS: "walk without use of device, as I was prior to surgery"; work on balance  OBJECTIVE:   DIAGNOSTIC FINDINGS: N/A  COGNITION: Overall cognitive status: Within functional limits for tasks assessed   SENSATION: Impaired in bil. LE's   COORDINATION: Decreased in bil. LE's wi  POSTURE: forward head, decreased thoracic kyphosis, and head is laterally flexed to Lt side  LOWER EXTREMITY ROM:   WFL's except for Lt ankle ROM - pt has worn AFO for > 4 yrs Decreased Rt active dorsiflexion and plantarflexion due to weakness  LOWER EXTREMITY MMT:    MMT Right Eval Left Eval  Hip flexion 5 5  Hip extension    Hip abduction    Hip adduction    Hip internal rotation    Hip external rotation    Knee flexion 4+ (seated position) 4 (seated position)  Knee extension 5 5  Ankle dorsiflexion 3- 0-1  Ankle plantarflexion    Ankle inversion    Ankle eversion    (Blank rows = not tested)  BED MOBILITY:  Modified independent   TRANSFERS: Assistive device utilized: Environmental consultant - 2 wheeled  Sit to stand: Modified independence short distances; SBA community  amb. With RW Stand to sit: Modified independence  CURB: TBA  STAIRS:  TBA  GAIT: Gait pattern: step through pattern, decreased ankle dorsiflexion- Right, decreased ankle dorsiflexion- Left, genu recurvatum- Left, and trunk rotated posterior- Left Distance walked: 60' Assistive device utilized: Environmental consultant - 2 wheeled Level of assistance: SBA Comments: AFO worn on LLE - carbon fiber with an anterior guard added (appears to be custom fabricated - pt states he obtained this brace approx. 4 yrs ago)  FUNCTIONAL TESTS:  Timed up and go (TUG): 18.97 secs with RW but pt was not safe - pt was instructed to ambulate at his normal speed but ambulated at fast pace and was unsteady with turns so score is not totally  accurate - CGA was provided for safety 10 meter walk test: 15.94 secs with RW = 2.06 ft/sec Berg Balance Scale: 26/56    PATIENT SURVEYS:  N/A for his diagnosis - s/p laminectomies for tumor resections     TODAY'S TREATMENT: 12-24-22  TherEx: Stretches - used green physioball rolling it straight ahead for trunk flexion for low back stretching; rolled ball to Lt and Rt  sides x 2 reps each 10 sec hold for lateral trunk musc. stretch  Trunk rotation stretch 2 reps each side in seated position - 10 sec hold  Pt performed bridging 5 reps Bridging with RLE extension 5 reps  LE's placed on red physioball - pt performed bridging and then lifting RLE up off of ball 5 reps; pt unable to lift LLE off ball in bridged position SLR RLE and LLE 10 reps Hip abduction LLE 10 reps with knee extended Prone position - Lt knee flexion 10 reps (no resistance); Lt hip extension with knee flexed at 90 degrees 10 reps with min assist to maintain Lt knee flexion at 90 degrees  Leg press; bil. LE's 150# 10 reps:  RLE only 100# 10 reps;  LLE only 90# 10 reps    Gait:  pt gait trained 115' with SBQC with CGA (max assist x1 for balance recovery)- pt lost balance when walking around curve on track - required  mod to max assist to prevent fall; pt stated his Rt foot hit leg of SBQC and caused him to lose his balance - appears to possibly be due to visual deficits due to head position of Rt lateral cervical flexion; pt lost balance again when turning around to sit on mat after amb. 115' - pt sat with uncontrolled descent to Rt side - losing balance and falling laterally toward Rt side on mat; pt reported his legs were getting tired after amb. Approx. 60' around track  Gait pattern:  step through pattern, decreased ankle dorsiflexion- Right, decreased ankle dorsiflexion- Left, genu recurvatum- Left, and trunk rotated posterior- Left; Pt wearing custom AFO on LLE   PATIENT EDUCATION:  Education details: cont HEP Person educated: Patient and Spouse Education method: Explanation Education comprehension: verbalized understanding  HOME EXERCISE PROGRAM:   Access Code: [email or web address] URL: [email or web address] Date: 11/03/2022 Prepared by: Maebelle Munroe  Exercises - Prone Knee Flexion AROM  - 1 x daily - 7 x weekly - 1 sets - 10 reps - 3 sec hold - Beginner Clam  - 1 x daily - 7 x weekly - 1 sets - 10 reps - Hooklying Isometric Clamshell  - 1 x daily - 7 x weekly - 1 sets - 10 reps - 3 sec hold - SEATED SIDE PLANK - LEFT SIDE  - 1 x daily - 7 x weekly - 3 sets - 10 reps   Access Code: [email or web address] URL: [email or web address] Date: 10/13/2022 Prepared by: Maryruth Eve  Exercises - Sit to Stand  - 1 x daily - 7 x weekly - 2-3 sets - 10 reps - Sit to Stand with Resistance Around Legs  - 1 x daily - 7 x weekly - 2-3 sets - 5 reps - Standing March with Unilateral Counter Support  - 1 x daily - 7 x weekly - 3 sets - 20 reps - Standing 3-Way Leg Reach with Resistance at Ankles and Counter Support  - 1 x daily - 7 x weekly - 2-3 sets - 10 reps  Walking with hemiwalker with gait belt and spouse supervision ~1x day  [employer] [id number]  GOALS: Goals reviewed with patient?  Yes  SHORT TERM GOALS: Target date: 11-06-22 (4 weeks)   Pt will perform sit to stand transfer with 1 UE support only from standard chair and will stabilize upon initial standing without bracing with legs. Baseline:  bil. UE support needed from mat table, RUE support only no bracing w/ Les (11/04/22) Goal status: MET  2.  Improve Berg balance test score to >/= 31/56 to demo improved standing balance. Baseline: 26/56;  score 32/56 on 11-05-22 Goal status: GOAL MET  3.  Pt will amb. 115' with hemiwalker with CGA on flat, even surface. Baseline: gait with hemiwalker to be assessed; SBQC with CGA to min assist due to hitting object on Rt side with SBQC when amb. Around track Goal status: Met with hemiwalker; Goal partially met with [facility] -- 11-05-22  4.  Increase gait velocity to >/= 2.4 ft/sec with RW for increased gait efficiency. Baseline: 15.94 secs = 2.06 ft/sec; 15.06 sec = 2.18 ft/sec (11/04/22) Goal status: PROGRESSING  5.  Improve TUG score to </= 17 secs with RW with pt demonstrating correct hand placement with sit to/from stand transfers and safe speed, I.e. not rushing. Baseline: 18.97 secs with RW but not safe, 16.25 sec w/ RW and close SBA (11/04/22) Goal status: MET  6.  Independent with HEP for balance and strengthening exercises. Baseline:  Goal status: Goal met 11-05-22  LONG TERM GOALS: Target date: 12-04-22 (8 weeks)  Pt will perform sit to stand transfer without UE support from standard chair and will stabilize upon initial standing without bracing with legs. Baseline:  bil. UE support needed from mat table; RUE support only no bracing w/ Les (11/04/22): 12-03-22 - balance/stability varies depending on fatigue; pt able to perform without UE support when legs are not fatigue, otherwise, needs to brace with legs for stability upon initial standing Goal status: Partially met 12-03-22   2.  Pt will amb. 350' with quad cane with supervision on flat, even surface for  increased community accessibility. Baseline: pt using RW- modified independent household, SBA community amb.;  115' x1  Goal status: Not met 12-03-22 - Goal modified due to c/o low back musc. Tightening up with use of SBQC after approx. 4' distance  3.  Improve Berg balance test score to >/= 36/56 to demo improved standing balance. Baseline: 26/56; regressed to 22/56 in today's session  Goal status: NOT MET (ongoing)  4.   Increase gait velocity to >/= 3.0 ft/sec with RW for increased gait efficiency. Baseline: 15.94 secs = 2.06 ft/sec; 15.06 sec = 2.18 ft/sec (11/04/22); 13.26 seconds = 2.47 ft/sec (11/19) Goal status: IN PROGRESS - IMPROVING  5.  Improve TUG score to </= 15 secs with hemiwalker (or quad cane) with pt demonstrating correct hand placement with sit to/from stand transfers and safe speed, I.e. not rushing. Baseline: 18.97 secs with RW but not safe; improved to 20.6 seconds with SBQC (minA); 11.95 seconds with 2WW (CGA but cues for slower pacing moving forward for safety)   Goal status: IN PROGRESS - IMPROVING   6.  Transfer floor to stand with UE support on sturdy object or furniture with supervision. Baseline: TBA Goal status: Goal met 12-03-22   NEW (REVISED) LONG TERM GOALS:  TARGET DATE 01-08-23   Pt will perform sit to stand transfer without UE support from standard chair and will stabilize upon initial standing without bracing with legs. Baseline:  bil. UE support needed from mat table; RUE support  only no bracing w/ Les (11/04/22): 12-03-22 - balance/stability varies depending on fatigue; pt able to perform without UE support when legs are not fatigue, otherwise, needs to brace with legs for stability upon initial standing Goal status: Partially met 12-03-22 - ONGOING   2.   Pt will amb. 350' with RW with supervision on flat, even surface for increased community accessibility. Baseline: pt using RW- modified independent household, SBA community amb.;  115' x1  Goal  status: Not met 12-03-22 - Goal modified due to c/o low back musc. Tightening up with use of SBQC after approx. 70' distance  3.  Improve Berg balance test score to >/= 36/56 to demo improved standing balance. Baseline: 26/56; regressed to 22/56 in today's session  Goal status: NOT MET (ongoing)  4.  Increase gait velocity to >/= 3.0 ft/sec with RW for increased gait efficiency. Baseline: 15.94 secs = 2.06 ft/sec; 15.06 sec = 2.18 ft/sec (11/04/22); 13.26 seconds = 2.47 ft/sec (11/19) Goal status: IN PROGRESS - IMPROVING  5.  Improve TUG score to </= 15 secs with RW with pt demonstrating correct hand placement with sit to/from stand transfers and safe speed, I.e. not rushing. Baseline: 18.97 secs with RW but not safe; improved to 20.6 seconds with SBQC (minA); 11.95 seconds with 2WW (CGA but cues for slower pacing moving forward for safety)   Goal status: IN PROGRESS - IMPROVING   ASSESSMENT:  CLINICAL IMPRESSION:  PT session focused on gait training with SBQC and LE and core strengthening.  Pt had 1 LOB with gait training when amb. Around curve of track toward Lt side, requiring max assist to prevent fall.  Pt reported his Rt foot hit the [facility] when walking but possible visual deficits with decreased Rt peripheral vision appears to possibly contribute to running into objects on Rt side, as pt holds head in cervical Rt lateral flexion.  Pt unable to lift LLE off of red physioball in bridged position due to Lt hip flexor weakness.  Continue POC.   OBJECTIVE IMPAIRMENTS: Abnormal gait, decreased activity tolerance, decreased balance, decreased coordination, decreased knowledge of use of DME, decreased strength, decreased safety awareness, and impaired sensation.   ACTIVITY LIMITATIONS: carrying, lifting, bending, standing, squatting, stairs, transfers, locomotion level, and driving  PARTICIPATION LIMITATIONS: meal prep, cleaning, laundry, driving, shopping, community activity, and yard  work  PERSONAL FACTORS: Past/current experiences and 1 comorbidity: h/o cervical tumors with s/p cervical fusion  are also affecting patient's functional outcome.   REHAB POTENTIAL: Good  CLINICAL DECISION MAKING: Evolving/moderate complexity  EVALUATION COMPLEXITY: Moderate  PLAN:  PT FREQUENCY:  2x/week  PT DURATION: 5 weeks (10 additional visits per renewal)  PLANNED INTERVENTIONS: Therapeutic exercises, Therapeutic activity, Neuromuscular re-education, Balance training, Gait training, Patient/Family education, Self Care, Stair training, Orthotic/Fit training, DME instructions, and Aquatic Therapy  PLAN FOR NEXT SESSION:  continue with standing balance, gait with SBQC short distance, </= 50' to minimize low back tightness, LE strengthening, core stabilization  work on gait training with SBQC SHORT distances (50') only:  any LLE strengthening exercises - closed chain, tall kneeling, etc.:  standing balance exercises/activities; functional strengthening (sit to stands, squats), quadruped, prone, Ramps and curbs w/ cane, core strengthening, low back stretches, balance seated on exercise ball, leg press, standing/half kneel core  Reaching forwards to R lower quarter, especially cross-body reaching, challenges core    Kerry Fort, PT 12/25/2022, 3:46 PM

## 2022-12-25 ENCOUNTER — Encounter: Payer: Self-pay | Admitting: Physical Therapy

## 2022-12-29 ENCOUNTER — Ambulatory Visit: Payer: Managed Care, Other (non HMO) | Admitting: Physical Therapy

## 2022-12-29 DIAGNOSIS — R2689 Other abnormalities of gait and mobility: Secondary | ICD-10-CM | POA: Diagnosis not present

## 2022-12-29 DIAGNOSIS — M6281 Muscle weakness (generalized): Secondary | ICD-10-CM

## 2022-12-29 DIAGNOSIS — R2681 Unsteadiness on feet: Secondary | ICD-10-CM

## 2022-12-29 NOTE — Therapy (Unsigned)
OUTPATIENT PHYSICAL THERAPY NEURO TREATMENT    Patient Name: Larry Riley MRN: [id number] DOB:[birth year], 42 y.o., male Today's Date: 12/30/2022   PCP: Corwin Levins., MD REFERRING PROVIDER: Juliann Pares, FNP  END OF SESSION:  PT End of Session - 12/30/22 0926     Visit Number 25    Number of Visits 30   10 visits added per renewal   Date for PT Re-Evaluation 01/08/23    Authorization Type [employer]    Authorization - Visit Number 25    Authorization - Number of Visits 60    PT Start Time 1450    PT Stop Time 1534    PT Time Calculation (min) 44 min    Equipment Utilized During Treatment Gait belt    Activity Tolerance Patient tolerated treatment well    Behavior During Therapy WFL for tasks assessed/performed                    Past Medical History:  Diagnosis Date   Anxiety    Depression    GERD (gastroesophageal reflux disease) 01/21/2017   MRSA (methicillin resistant Staphylococcus aureus) 2005   leg   Neurofibromatosis    tumor down spine and brain   Past Surgical History:  Procedure Laterality Date   hydrocephalus     shunt 1999-[place]   NECK SURGERY      tumor removal   SPINE SURGERY  10/2013   c2-t2 fusion  , laminectomy c1-6-- Dr Raynald Kemp-- [place]   VASECTOMY  02/2013   Patient Active Problem List   Diagnosis Date Noted   Skin lesion 10/24/2022   Urinary frequency 10/21/2022   Peripheral edema 08/17/2022   Hypotension 08/17/2022   Urinary incontinence 07/04/2022   Neurofibroma 04/23/2022   Neoplasm of unspecified behavior of bone, soft tissue, and skin 02/16/2022   Acute hearing loss, right 08/04/2021   Hyperglycemia 08/04/2021   Abscess of right foot 08/16/2020   Cellulitis of right foot 08/16/2020   Microhematuria 08/03/2020   Gastroesophageal reflux disease 07/07/2019   External otitis of right ear 03/09/2017   Thumb laceration, right, initial encounter 03/09/2017   Weakness of left side of body 11/19/2016   Stress due to  illness of family member 06/10/2016   Syncope 04/23/2016   Onychomycosis 04/23/2016   Impacted ear wax 04/23/2016   Constipation due to opioid therapy 06/13/2014   Chronic pain syndrome 02/21/2014   Allergic reaction 02/20/2014   Neurofibromatosis, type 1 (HCC) 12/14/2013   Status post cervical arthrodesis 11/21/2013   Cervical myelopathy (HCC) 09/21/2013   Encounter for well adult exam with abnormal findings 06/23/2010   Disorder of autonomic nervous system 04/12/2009   Other psoriasis 08/24/2007   Tinea pedis 08/24/2007    ONSET DATE: 04-23-22  REFERRING DIAG:  Diagnosis  D49.2 (ICD-10-CM) - Neoplasm of unspecified behavior of bone, soft tissue, and skin    THERAPY DIAG:  Other abnormalities of gait and mobility  Muscle weakness (generalized)  Unsteadiness on feet  Rationale for Evaluation and Treatment: Rehabilitation  SUBJECTIVE STATEMENT:  "Larry Riley"    Pt reports he is having tightness in his low back today; used percussion massage gun prior to leaving home for PT appt - says that helped some.  Wife states pt is getting much stronger - was doing bridging exercises and was very stable - had little trunk movement   Pt accompanied by:  spouse, [name]  PERTINENT HISTORY: s/p T11-L1 Laminectomies for intradural tumor resections & L3-L5 laminectomies for intradural tumor resections (hospitalization 04-23-22 - 04-29-22;  Neurofibromatosis 1 diagnosed in 1995  History of foot surgery 08/2020  Patient had metal removed from R foot and infected tissue removed  Neurofibromatosis (CMS/HCC)  Neurofibromatosis, type 1 (von Recklinghausen's disease) (CMS/HCC)  Status post cervical arthrodesis 11/21/2013 - cervical fusion C2-T2, laminectomy C1- C6  PAIN:   Are you having pain? Yes: NPRS scale: 4/10 Pain  location: stiffness above the hips Pain description: stiff Aggravating factors: sitting Relieving factors: stretching Pain was 7-8/10 prior to surgery (on average day) - pt reports pain is much improved since surgery  PRECAUTIONS: Fall  RED FLAGS: Bowel or bladder incontinence: Yes: Improving - pt states he has mostly bladder incontinence at night; little control over starting/stopping  WEIGHT BEARING RESTRICTIONS: No  FALLS: Has patient fallen in last 6 months? Yes. Number of falls 2  LIVING ENVIRONMENT: Lives with: lives with their spouse Lives in: House/apartment Stairs: Yes: External: 2 steps; can reach both Has following equipment at home: Dan Humphreys - 2 wheeled, Wheelchair (manual), Tour manager, and hemiwalker  PLOF: Independent with basic ADLs, Independent with household mobility without device, Independent with community mobility without device, and Independent with transfers; pt drove prior to surgery on 04-23-22 (has not driven since)  PATIENT GOALS: "walk without use of device, as I was prior to surgery"; work on balance  OBJECTIVE:   DIAGNOSTIC FINDINGS: N/A  COGNITION: Overall cognitive status: Within functional limits for tasks assessed   SENSATION: Impaired in bil. LE's   COORDINATION: Decreased in bil. LE's wi  POSTURE: forward head, decreased thoracic kyphosis, and head is laterally flexed to Lt side  LOWER EXTREMITY ROM:   WFL's except for Lt ankle ROM - pt has worn AFO for > 4 yrs Decreased Rt active dorsiflexion and plantarflexion due to weakness  LOWER EXTREMITY MMT:    MMT Right Eval Left Eval  Hip flexion 5 5  Hip extension    Hip abduction    Hip adduction    Hip internal rotation    Hip external rotation    Knee flexion 4+ (seated position) 4 (seated position)  Knee extension 5 5  Ankle dorsiflexion 3- 0-1  Ankle plantarflexion    Ankle inversion    Ankle eversion    (Blank rows = not tested)  BED MOBILITY:  Modified independent    TRANSFERS: Assistive device utilized: Environmental consultant - 2 wheeled  Sit to stand: Modified independence short distances; SBA community amb. With RW Stand to sit: Modified independence  CURB: TBA  STAIRS:  TBA  GAIT: Gait pattern: step through pattern, decreased ankle dorsiflexion- Right, decreased ankle dorsiflexion- Left, genu recurvatum- Left, and trunk rotated posterior- Left Distance walked: 58' Assistive device utilized: Environmental consultant - 2 wheeled Level of assistance: SBA Comments: AFO worn on LLE - carbon fiber with an anterior guard added (appears to be custom fabricated - pt states he obtained this brace approx. 4 yrs ago)  FUNCTIONAL TESTS:  Timed up and go (TUG): 18.97 secs with RW but pt was not safe - pt was  instructed to ambulate at his normal speed but ambulated at fast pace and was unsteady with turns so score is not totally accurate - CGA was provided for safety 10 meter walk test: 15.94 secs with RW = 2.06 ft/sec Berg Balance Scale: 26/56    PATIENT SURVEYS:  N/A for his diagnosis - s/p laminectomies for tumor resections     TODAY'S TREATMENT: 12-29-22  TherEx: Stretches - used red physioball rolling it straight ahead for trunk flexion for low back stretching; rolled ball to Lt and Rt  sides x 2 reps each 10 sec hold for lateral trunk musc. stretch  Trunk rotation stretch 2 reps each side in seated position - 10 sec hold Step up exercise onto 4" step inside // bars - 10 reps RLE, 10 reps LLE with bil. UE support; step down exercise RLE and LLE 5 reps each with bil. UE support (full hand support) needed due to LE weakness  Leg press; bil. LE's 150# 10 reps x 2 sets:  RLE only 100# 20 reps;  LLE only 90# 20 reps    Gait:  pt gait trained 115' with SBQC with CGA - legs noted to become weaker after amb. Approx. 90'; instructed pt to sit and rest for safety due to weakness/decreased muscle endurance  Gait pattern:  step through pattern, decreased ankle dorsiflexion- Right,  decreased ankle dorsiflexion- Left, genu recurvatum- Left, and trunk rotated posterior- Left; Pt wearing custom AFO on LLE  Pt used RW for assist with amb. During remainder of PT session  (from mat to // bars, to leg press)  NeuroRe-ed: Standing balance activities inside // bars - pt performed stepping over and back of black balance beam - 10 reps each leg with bil. UE support - gradual decrease in amount of UE support as able; performed stepping laterally over and back of balance beam - 10 reps each leg with UE support on // bars prn  Pt performed SLS activities - touching balance bubbles (3) in various sequences 5 -7 reps each LE, attempting with gradual decrease in UE support on // bars;  pt unable to perform SLS on either leg without UE support on // bar;  Tap ups to 4" step - 10 reps each foot, alternating to facilitate weight shift in standing; bil. UE support used on // bars  PATIENT EDUCATION:  Education details: cont HEP Person educated: Patient and Spouse Education method: Explanation Education comprehension: verbalized understanding  HOME EXERCISE PROGRAM:   Access Code: [email or web address] URL: [email or web address] Date: 11/03/2022 Prepared by: Maebelle Munroe  Exercises - Prone Knee Flexion AROM  - 1 x daily - 7 x weekly - 1 sets - 10 reps - 3 sec hold - Beginner Clam  - 1 x daily - 7 x weekly - 1 sets - 10 reps - Hooklying Isometric Clamshell  - 1 x daily - 7 x weekly - 1 sets - 10 reps - 3 sec hold - SEATED SIDE PLANK - LEFT SIDE  - 1 x daily - 7 x weekly - 3 sets - 10 reps   Access Code: [email or web address] URL: [email or web address] Date: 10/13/2022 Prepared by: Maryruth Eve  Exercises - Sit to Stand  - 1 x daily - 7 x weekly - 2-3 sets - 10 reps - Sit to Stand with Resistance Around Legs  - 1 x daily - 7 x weekly - 2-3 sets - 5 reps - Standing March with Unilateral Counter Support  - 1 x daily - 7 x weekly - 3  sets - 20 reps - Standing 3-Way Leg Reach  with Resistance at Ankles and Counter Support  - 1 x daily - 7 x weekly - 2-3 sets - 10 reps  Walking with hemiwalker with gait belt and spouse supervision ~1x day  [employer] [email or web address]  GOALS: Goals reviewed with patient? Yes  SHORT TERM GOALS: Target date: 11-06-22 (4 weeks)   Pt will perform sit to stand transfer with 1 UE support only from standard chair and will stabilize upon initial standing without bracing with legs. Baseline:  bil. UE support needed from mat table, RUE support only no bracing w/ Les (11/04/22) Goal status: MET  2.  Improve Berg balance test score to >/= 31/56 to demo improved standing balance. Baseline: 26/56;  score 32/56 on 11-05-22 Goal status: GOAL MET  3.  Pt will amb. 115' with hemiwalker with CGA on flat, even surface. Baseline: gait with hemiwalker to be assessed; SBQC with CGA to min assist due to hitting object on Rt side with SBQC when amb. Around track Goal status: Met with hemiwalker; Goal partially met with [facility] -- 11-05-22  4.  Increase gait velocity to >/= 2.4 ft/sec with RW for increased gait efficiency. Baseline: 15.94 secs = 2.06 ft/sec; 15.06 sec = 2.18 ft/sec (11/04/22) Goal status: PROGRESSING  5.  Improve TUG score to </= 17 secs with RW with pt demonstrating correct hand placement with sit to/from stand transfers and safe speed, I.e. not rushing. Baseline: 18.97 secs with RW but not safe, 16.25 sec w/ RW and close SBA (11/04/22) Goal status: MET  6.  Independent with HEP for balance and strengthening exercises. Baseline:  Goal status: Goal met 11-05-22  LONG TERM GOALS: Target date: 12-04-22 (8 weeks)  Pt will perform sit to stand transfer without UE support from standard chair and will stabilize upon initial standing without bracing with legs. Baseline:  bil. UE support needed from mat table; RUE support only no bracing w/ Les (11/04/22): 12-03-22 - balance/stability varies depending on fatigue; pt able to perform without UE  support when legs are not fatigue, otherwise, needs to brace with legs for stability upon initial standing Goal status: Partially met 12-03-22   2.  Pt will amb. 350' with quad cane with supervision on flat, even surface for increased community accessibility. Baseline: pt using RW- modified independent household, SBA community amb.;  115' x1  Goal status: Not met 12-03-22 - Goal modified due to c/o low back musc. Tightening up with use of SBQC after approx. 33' distance  3.  Improve Berg balance test score to >/= 36/56 to demo improved standing balance. Baseline: 26/56; regressed to 22/56 in today's session  Goal status: NOT MET (ongoing)  4.   Increase gait velocity to >/= 3.0 ft/sec with RW for increased gait efficiency. Baseline: 15.94 secs = 2.06 ft/sec; 15.06 sec = 2.18 ft/sec (11/04/22); 13.26 seconds = 2.47 ft/sec (11/19) Goal status: IN PROGRESS - IMPROVING  5.  Improve TUG score to </= 15 secs with hemiwalker (or quad cane) with pt demonstrating correct hand placement with sit to/from stand transfers and safe speed, I.e. not rushing. Baseline: 18.97 secs with RW but not safe; improved to 20.6 seconds with SBQC (minA); 11.95 seconds with 2WW (CGA but cues for slower pacing moving forward for safety)   Goal status: IN PROGRESS - IMPROVING   6.  Transfer floor to stand with UE support on sturdy object or furniture with supervision. Baseline: TBA Goal status: Goal met 12-03-22  NEW (REVISED) LONG TERM GOALS:  TARGET DATE 01-08-23   Pt will perform sit to stand transfer without UE support from standard chair and will stabilize upon initial standing without bracing with legs. Baseline:  bil. UE support needed from mat table; RUE support only no bracing w/ Les (11/04/22): 12-03-22 - balance/stability varies depending on fatigue; pt able to perform without UE support when legs are not fatigue, otherwise, needs to brace with legs for stability upon initial standing Goal status:  Partially met 12-03-22 - ONGOING   2.   Pt will amb. 350' with RW with supervision on flat, even surface for increased community accessibility. Baseline: pt using RW- modified independent household, SBA community amb.;  115' x1  Goal status: Not met 12-03-22 - Goal modified due to c/o low back musc. Tightening up with use of SBQC after approx. 24' distance  3.  Improve Berg balance test score to >/= 36/56 to demo improved standing balance. Baseline: 26/56; regressed to 22/56 in today's session  Goal status: NOT MET (ongoing)  4.  Increase gait velocity to >/= 3.0 ft/sec with RW for increased gait efficiency. Baseline: 15.94 secs = 2.06 ft/sec; 15.06 sec = 2.18 ft/sec (11/04/22); 13.26 seconds = 2.47 ft/sec (11/19) Goal status: IN PROGRESS - IMPROVING  5.  Improve TUG score to </= 15 secs with RW with pt demonstrating correct hand placement with sit to/from stand transfers and safe speed, I.e. not rushing. Baseline: 18.97 secs with RW but not safe; improved to 20.6 seconds with SBQC (minA); 11.95 seconds with 2WW (CGA but cues for slower pacing moving forward for safety)   Goal status: IN PROGRESS - IMPROVING   ASSESSMENT:  CLINICAL IMPRESSION:  PT session focused on gait training with [facility] with distance limited to 115' (1 lap) due to bil. LE weakness and decreased muscle endurance which results in unsteadiness/LOB with instability due to weakness.  Session also focused on SLS activities with pt requiring UE support to perform SLS on each leg.  Pt noted to have some decreased safety awareness as he initially requested to perform SLS activities "blaze pods" at side of mat with use of quad cane.  Recommended to pt that these activities be performed inside // bars for stable UE support prn for safety.  Continue POC.   OBJECTIVE IMPAIRMENTS: Abnormal gait, decreased activity tolerance, decreased balance, decreased coordination, decreased knowledge of use of DME, decreased strength, decreased safety  awareness, and impaired sensation.   ACTIVITY LIMITATIONS: carrying, lifting, bending, standing, squatting, stairs, transfers, locomotion level, and driving  PARTICIPATION LIMITATIONS: meal prep, cleaning, laundry, driving, shopping, community activity, and yard work  PERSONAL FACTORS: Past/current experiences and 1 comorbidity: h/o cervical tumors with s/p cervical fusion  are also affecting patient's functional outcome.   REHAB POTENTIAL: Good  CLINICAL DECISION MAKING: Evolving/moderate complexity  EVALUATION COMPLEXITY: Moderate  PLAN:  PT FREQUENCY:  2x/week  PT DURATION: 5 weeks (10 additional visits per renewal)  PLANNED INTERVENTIONS: Therapeutic exercises, Therapeutic activity, Neuromuscular re-education, Balance training, Gait training, Patient/Family education, Self Care, Stair training, Orthotic/Fit training, DME instructions, and Aquatic Therapy  PLAN FOR NEXT SESSION:  continue with standing balance, gait with SBQC (1 lap has been max distance prior to onset of LE weakness/instability), LE strengthening, core stabilization  work on gait training with SBQC SHORT distances (50') only:  any LLE strengthening exercises - closed chain, tall kneeling, etc.:  standing balance exercises/activities; functional strengthening (sit to stands, squats), quadruped, prone, Ramps and curbs w/ cane, core strengthening, low  back stretches, balance seated on exercise ball, leg press, standing/half kneel core  Reaching forwards to R lower quarter, especially cross-body reaching, challenges core    Kerry Fort, PT 12/30/2022, 9:29 AM

## 2022-12-30 ENCOUNTER — Encounter: Payer: Self-pay | Admitting: Physical Therapy

## 2022-12-31 ENCOUNTER — Encounter: Payer: Self-pay | Admitting: Physical Therapy

## 2022-12-31 ENCOUNTER — Ambulatory Visit: Payer: Managed Care, Other (non HMO) | Admitting: Physical Therapy

## 2022-12-31 VITALS — BP 108/66 | HR 86

## 2022-12-31 DIAGNOSIS — R2681 Unsteadiness on feet: Secondary | ICD-10-CM

## 2022-12-31 DIAGNOSIS — R2689 Other abnormalities of gait and mobility: Secondary | ICD-10-CM

## 2022-12-31 DIAGNOSIS — M6281 Muscle weakness (generalized): Secondary | ICD-10-CM

## 2022-12-31 NOTE — Therapy (Signed)
OUTPATIENT PHYSICAL THERAPY NEURO TREATMENT    Patient Name: Kali Dang MRN: [id number] DOB:Oct 08, 1980, 42 y.o., male Today's Date: 12/31/2022   PCP: Corwin Levins., MD REFERRING PROVIDER: Juliann Pares, FNP  END OF SESSION:  PT End of Session - 12/31/22 1451     Visit Number 26    Number of Visits 30    Date for PT Re-Evaluation 01/08/23    Authorization Type [employer]    Authorization - Number of Visits 60    PT Start Time 1447    PT Stop Time 1530    PT Time Calculation (min) 43 min    Equipment Utilized During Treatment Gait belt    Activity Tolerance Patient tolerated treatment well    Behavior During Therapy WFL for tasks assessed/performed             Past Medical History:  Diagnosis Date   Anxiety    Depression    GERD (gastroesophageal reflux disease) 01/21/2017   MRSA (methicillin resistant Staphylococcus aureus) 2005   leg   Neurofibromatosis    tumor down spine and brain   Past Surgical History:  Procedure Laterality Date   hydrocephalus     shunt 1999-[place]   NECK SURGERY      tumor removal   SPINE SURGERY  10/2013   c2-t2 fusion  , laminectomy c1-6-- Dr Raynald Kemp-- [place]   VASECTOMY  02/2013   Patient Active Problem List   Diagnosis Date Noted   Skin lesion 10/24/2022   Urinary frequency 10/21/2022   Peripheral edema 08/17/2022   Hypotension 08/17/2022   Urinary incontinence 07/04/2022   Neurofibroma 04/23/2022   Neoplasm of unspecified behavior of bone, soft tissue, and skin 02/16/2022   Acute hearing loss, right 08/04/2021   Hyperglycemia 08/04/2021   Abscess of right foot 08/16/2020   Cellulitis of right foot 08/16/2020   Microhematuria 08/03/2020   Gastroesophageal reflux disease 07/07/2019   External otitis of right ear 03/09/2017   Thumb laceration, right, initial encounter 03/09/2017   Weakness of left side of body 11/19/2016   Stress due to illness of family member 06/10/2016   Syncope 04/23/2016   Onychomycosis  04/23/2016   Impacted ear wax 04/23/2016   Constipation due to opioid therapy 06/13/2014   Chronic pain syndrome 02/21/2014   Allergic reaction 02/20/2014   Neurofibromatosis, type 1 (HCC) 12/14/2013   Status post cervical arthrodesis 11/21/2013   Cervical myelopathy (HCC) 09/21/2013   Encounter for well adult exam with abnormal findings 06/23/2010   Disorder of autonomic nervous system 04/12/2009   Other psoriasis 08/24/2007   Tinea pedis 08/24/2007    ONSET DATE: 04-23-22  REFERRING DIAG:  Diagnosis  D49.2 (ICD-10-CM) - Neoplasm of unspecified behavior of bone, soft tissue, and skin    THERAPY DIAG:  Other abnormalities of gait and mobility  Muscle weakness (generalized)  Unsteadiness on feet  Rationale for Evaluation and Treatment: Rehabilitation  SUBJECTIVE STATEMENT:  "[name]"   Patient reports able to go to a friends house for the first time and up and down movie stairs. Denies falls; had one near fall walking into clinic try to sit prematurely on mat but was able to correct.   Pt accompanied by:  spouse, [name]  PERTINENT HISTORY: s/p T11-L1 Laminectomies for intradural tumor resections & L3-L5 laminectomies for intradural tumor resections (hospitalization 04-23-22 - 04-29-22;  Neurofibromatosis 1 diagnosed in 1995  History of foot surgery 08/2020  Patient had metal removed from R foot and infected tissue removed  Neurofibromatosis (CMS/HCC)  Neurofibromatosis, type 1 (von Recklinghausen's disease) (CMS/HCC)  Status post cervical arthrodesis 11/21/2013 - cervical fusion C2-T2, laminectomy C1- C6  PAIN:   Are you having pain? Yes: NPRS scale: 4/10 Pain location: stiffness above the hips Pain description: stiff Aggravating factors: sitting Relieving factors: stretching Pain was 7-8/10  prior to surgery (on average day) - pt reports pain is much improved since surgery  PRECAUTIONS: Fall  RED FLAGS: Bowel or bladder incontinence: Yes: Improving - pt states he has mostly bladder incontinence at night; little control over starting/stopping  WEIGHT BEARING RESTRICTIONS: No  FALLS: Has patient fallen in last 6 months? Yes. Number of falls 2  LIVING ENVIRONMENT: Lives with: lives with their spouse Lives in: House/apartment Stairs: Yes: External: 2 steps; can reach both Has following equipment at home: Dan Humphreys - 2 wheeled, Wheelchair (manual), Tour manager, and hemiwalker  PLOF: Independent with basic ADLs, Independent with household mobility without device, Independent with community mobility without device, and Independent with transfers; pt drove prior to surgery on 04-23-22 (has not driven since)  PATIENT GOALS: "walk without use of device, as I was prior to surgery"; work on balance  OBJECTIVE:   DIAGNOSTIC FINDINGS: N/A  COGNITION: Overall cognitive status: Within functional limits for tasks assessed   SENSATION: Impaired in bil. LE's   COORDINATION: Decreased in bil. LE's wi  POSTURE: forward head, decreased thoracic kyphosis, and head is laterally flexed to Lt side  LOWER EXTREMITY ROM:   WFL's except for Lt ankle ROM - pt has worn AFO for > 4 yrs Decreased Rt active dorsiflexion and plantarflexion due to weakness  LOWER EXTREMITY MMT:    MMT Right Eval Left Eval  Hip flexion 5 5  Hip extension    Hip abduction    Hip adduction    Hip internal rotation    Hip external rotation    Knee flexion 4+ (seated position) 4 (seated position)  Knee extension 5 5  Ankle dorsiflexion 3- 0-1  Ankle plantarflexion    Ankle inversion    Ankle eversion    (Blank rows = not tested)  BED MOBILITY:  Modified independent   TRANSFERS: Assistive device utilized: Environmental consultant - 2 wheeled  Sit to stand: Modified independence short distances; SBA community amb. With  RW Stand to sit: Modified independence  CURB: TBA  STAIRS:  TBA  GAIT: Gait pattern: step through pattern, decreased ankle dorsiflexion- Right, decreased ankle dorsiflexion- Left, genu recurvatum- Left, and trunk rotated posterior- Left Distance walked: 42' Assistive device utilized: Environmental consultant - 2 wheeled Level of assistance: SBA Comments: AFO worn on LLE - carbon fiber with an anterior guard added (appears to be custom fabricated - pt states he obtained this brace approx. 4 yrs ago)  FUNCTIONAL TESTS:  Timed up and go (TUG): 18.97 secs with RW but pt was not safe - pt was instructed to ambulate at his normal speed but ambulated at fast  pace and was unsteady with turns so score is not totally accurate - CGA was provided for safety 10 meter walk test: 15.94 secs with RW = 2.06 ft/sec Berg Balance Scale: 26/56    PATIENT SURVEYS:  N/A for his diagnosis - s/p laminectomies for tumor resections  TODAY'S TREATMENT: 12-31-22  Vitals:   12/31/22 1456  BP: 108/66  Pulse: 86    Seated on RUE  Pt used RW for assist with amb. During remainder of PT session  (from mat to // bars)  NMR: Seated PWR! Moves 2 x 5 of each: Seated on low mat with 4" step under feet for increased stability, mirroring therapist, for trunk control and lumbar mobility PWR! up  PWR! [employer]! Twist  Between // bars:   Standing WBOS reaching to squigs on elevated table rearching cross body and placing on mirror 2 x 5 (Required seated break between each rap for fatigue)  Gait:   Pt gait trained 4 x 30 feet with SBQC with CGA, standing up walking to chair and slowly lowering to sit to address poor transfer noted at start of session  Gait pattern:  step through pattern, decreased ankle dorsiflexion- Right, decreased ankle dorsiflexion- Left, genu recurvatum- Left, and trunk rotated posterior- Left; Pt wearing custom AFO on LLE, increased shake and fatigue noted with duration and reps  PATIENT EDUCATION:   Education details: cont HEP + Seated PWR! Moves Person educated: Patient and Spouse Education method: Explanation Education comprehension: verbalized understanding  HOME EXERCISE PROGRAM:   Access Code: [email or web address] URL: [email or web address] Date: 11/03/2022 Prepared by: Maebelle Munroe  Exercises - Prone Knee Flexion AROM  - 1 x daily - 7 x weekly - 1 sets - 10 reps - 3 sec hold - Beginner Clam  - 1 x daily - 7 x weekly - 1 sets - 10 reps - Hooklying Isometric Clamshell  - 1 x daily - 7 x weekly - 1 sets - 10 reps - 3 sec hold - SEATED SIDE PLANK - LEFT SIDE  - 1 x daily - 7 x weekly - 3 sets - 10 reps   Access Code: [email or web address] URL: [email or web address] Date: 10/13/2022 Prepared by: Maryruth Eve  Exercises - Sit to Stand  - 1 x daily - 7 x weekly - 2-3 sets - 10 reps - Sit to Stand with Resistance Around Legs  - 1 x daily - 7 x weekly - 2-3 sets - 5 reps - Standing March with Unilateral Counter Support  - 1 x daily - 7 x weekly - 3 sets - 20 reps - Standing 3-Way Leg Reach with Resistance at Ankles and Counter Support  - 1 x daily - 7 x weekly - 2-3 sets - 10 reps  Walking with hemiwalker with gait belt and spouse supervision ~1x day  [employer] [email or web address]  Seated PWR! Moves (noted below) 2 x 5 reps 1x a day PWR! up  PWR! [employer]! Twist  GOALS: Goals reviewed with patient? Yes  SHORT TERM GOALS: Target date: 11-06-22 (4 weeks)   Pt will perform sit to stand transfer with 1 UE support only from standard chair and will stabilize upon initial standing without bracing with legs. Baseline:  bil. UE support needed from mat table, RUE support only no bracing w/ Les (11/04/22) Goal status: MET  2.  Improve Berg balance test score to >/= 31/56 to demo improved standing balance. Baseline: 26/56;  score 32/56 on 11-05-22 Goal status: GOAL MET  3.  Pt will amb.  20' with hemiwalker with CGA on flat, even surface. Baseline: gait with hemiwalker to be  assessed; SBQC with CGA to min assist due to hitting object on Rt side with SBQC when amb. Around track Goal status: Met with hemiwalker; Goal partially met with [facility] -- 11-05-22  4.  Increase gait velocity to >/= 2.4 ft/sec with RW for increased gait efficiency. Baseline: 15.94 secs = 2.06 ft/sec; 15.06 sec = 2.18 ft/sec (11/04/22) Goal status: PROGRESSING  5.  Improve TUG score to </= 17 secs with RW with pt demonstrating correct hand placement with sit to/from stand transfers and safe speed, I.e. not rushing. Baseline: 18.97 secs with RW but not safe, 16.25 sec w/ RW and close SBA (11/04/22) Goal status: MET  6.  Independent with HEP for balance and strengthening exercises. Baseline:  Goal status: Goal met 11-05-22  LONG TERM GOALS: Target date: 12-04-22 (8 weeks)  Pt will perform sit to stand transfer without UE support from standard chair and will stabilize upon initial standing without bracing with legs. Baseline:  bil. UE support needed from mat table; RUE support only no bracing w/ Les (11/04/22): 12-03-22 - balance/stability varies depending on fatigue; pt able to perform without UE support when legs are not fatigue, otherwise, needs to brace with legs for stability upon initial standing Goal status: Partially met 12-03-22   2.  Pt will amb. 350' with quad cane with supervision on flat, even surface for increased community accessibility. Baseline: pt using RW- modified independent household, SBA community amb.;  115' x1  Goal status: Not met 12-03-22 - Goal modified due to c/o low back musc. Tightening up with use of SBQC after approx. 61' distance  3.  Improve Berg balance test score to >/= 36/56 to demo improved standing balance. Baseline: 26/56; regressed to 22/56 in today's session  Goal status: NOT MET (ongoing)  4.   Increase gait velocity to >/= 3.0 ft/sec with RW for increased gait efficiency. Baseline: 15.94 secs = 2.06 ft/sec; 15.06 sec = 2.18 ft/sec (11/04/22);  13.26 seconds = 2.47 ft/sec (11/19) Goal status: IN PROGRESS - IMPROVING  5.  Improve TUG score to </= 15 secs with hemiwalker (or quad cane) with pt demonstrating correct hand placement with sit to/from stand transfers and safe speed, I.e. not rushing. Baseline: 18.97 secs with RW but not safe; improved to 20.6 seconds with SBQC (minA); 11.95 seconds with 2WW (CGA but cues for slower pacing moving forward for safety)   Goal status: IN PROGRESS - IMPROVING   6.  Transfer floor to stand with UE support on sturdy object or furniture with supervision. Baseline: TBA Goal status: Goal met 12-03-22   NEW (REVISED) LONG TERM GOALS:  TARGET DATE 01-08-23   Pt will perform sit to stand transfer without UE support from standard chair and will stabilize upon initial standing without bracing with legs. Baseline:  bil. UE support needed from mat table; RUE support only no bracing w/ Les (11/04/22): 12-03-22 - balance/stability varies depending on fatigue; pt able to perform without UE support when legs are not fatigue, otherwise, needs to brace with legs for stability upon initial standing Goal status: Partially met 12-03-22 - ONGOING   2.   Pt will amb. 350' with RW with supervision on flat, even surface for increased community accessibility. Baseline: pt using RW- modified independent household, SBA community amb.;  115' x1  Goal status: Not met 12-03-22 - Goal modified due to c/o low back musc. Tightening up with use of SBQC  after approx. 87' distance  3.  Improve Berg balance test score to >/= 36/56 to demo improved standing balance. Baseline: 26/56; regressed to 22/56 in today's session  Goal status: NOT MET (ongoing)  4.  Increase gait velocity to >/= 3.0 ft/sec with RW for increased gait efficiency. Baseline: 15.94 secs = 2.06 ft/sec; 15.06 sec = 2.18 ft/sec (11/04/22); 13.26 seconds = 2.47 ft/sec (11/19) Goal status: IN PROGRESS - IMPROVING  5.  Improve TUG score to </= 15 secs with RW with  pt demonstrating correct hand placement with sit to/from stand transfers and safe speed, I.e. not rushing. Baseline: 18.97 secs with RW but not safe; improved to 20.6 seconds with SBQC (minA); 11.95 seconds with 2WW (CGA but cues for slower pacing moving forward for safety)   Goal status: IN PROGRESS - IMPROVING   ASSESSMENT:  CLINICAL IMPRESSION:  PT session focused on working on gait training, transfers, seated PWR! Moves for trunk control, and balance tasks. Pt required numerous breaks to manage fatigue. Poor carry over noted on transfers as both initial and final transfer end of session looked about the same likely due to patient's decreased safety awareness. Continued to reinforce safety with turns and transfers. Continue POC.   OBJECTIVE IMPAIRMENTS: Abnormal gait, decreased activity tolerance, decreased balance, decreased coordination, decreased knowledge of use of DME, decreased strength, decreased safety awareness, and impaired sensation.   ACTIVITY LIMITATIONS: carrying, lifting, bending, standing, squatting, stairs, transfers, locomotion level, and driving  PARTICIPATION LIMITATIONS: meal prep, cleaning, laundry, driving, shopping, community activity, and yard work  PERSONAL FACTORS: Past/current experiences and 1 comorbidity: h/o cervical tumors with s/p cervical fusion  are also affecting patient's functional outcome.   REHAB POTENTIAL: Good  CLINICAL DECISION MAKING: Evolving/moderate complexity  EVALUATION COMPLEXITY: Moderate  PLAN:  PT FREQUENCY:  2x/week  PT DURATION: 5 weeks (10 additional visits per renewal)  PLANNED INTERVENTIONS: Therapeutic exercises, Therapeutic activity, Neuromuscular re-education, Balance training, Gait training, Patient/Family education, Self Care, Stair training, Orthotic/Fit training, DME instructions, and Aquatic Therapy  PLAN FOR NEXT SESSION: work on updating POC as needed  continue with standing balance, gait with SBQC (1 lap has been  max distance prior to onset of LE weakness/instability), LE strengthening, core stabilization  work on gait training with SBQC SHORT distances (50') only:  any LLE strengthening exercises - closed chain, tall kneeling, etc.:  standing balance exercises/activities; functional strengthening (sit to stands, squats), quadruped, prone, Ramps and curbs w/ cane, core strengthening, low back stretches, balance seated on exercise ball, leg press, standing/half kneel core  Reaching forwards to R lower quarter, especially cross-body reaching, challenges core  Maryruth Eve, PT, DPT 12/31/2022, 3:50 PM

## 2023-01-04 ENCOUNTER — Encounter: Payer: Self-pay | Admitting: Physical Therapy

## 2023-01-04 ENCOUNTER — Ambulatory Visit: Payer: Managed Care, Other (non HMO) | Admitting: Physical Therapy

## 2023-01-04 VITALS — BP 102/64 | HR 67

## 2023-01-04 DIAGNOSIS — R2681 Unsteadiness on feet: Secondary | ICD-10-CM

## 2023-01-04 DIAGNOSIS — R2689 Other abnormalities of gait and mobility: Secondary | ICD-10-CM | POA: Diagnosis not present

## 2023-01-04 DIAGNOSIS — M6281 Muscle weakness (generalized): Secondary | ICD-10-CM

## 2023-01-04 NOTE — Therapy (Signed)
OUTPATIENT PHYSICAL THERAPY NEURO TREATMENT    Patient Name: Larry Riley MRN: [id number] DOB:19-May-1980, 42 y.o., male Today's Date: 01/04/2023   PCP: Corwin Levins., MD REFERRING PROVIDER: Juliann Pares, FNP  END OF SESSION:  PT End of Session - 01/04/23 1453     Visit Number 27    Number of Visits 30    Date for PT Re-Evaluation 01/08/23    Authorization Type [employer]    Authorization - Visit Number 26    Authorization - Number of Visits 60    PT Start Time 1450    PT Stop Time 1528    PT Time Calculation (min) 38 min    Equipment Utilized During Treatment Gait belt    Activity Tolerance Patient tolerated treatment well    Behavior During Therapy WFL for tasks assessed/performed             Past Medical History:  Diagnosis Date   Anxiety    Depression    GERD (gastroesophageal reflux disease) 01/21/2017   MRSA (methicillin resistant Staphylococcus aureus) 2005   leg   Neurofibromatosis    tumor down spine and brain   Past Surgical History:  Procedure Laterality Date   hydrocephalus     shunt 1999-[place]   NECK SURGERY      tumor removal   SPINE SURGERY  10/2013   c2-t2 fusion  , laminectomy c1-6-- Dr Raynald Kemp-- [place]   VASECTOMY  02/2013   Patient Active Problem List   Diagnosis Date Noted   Skin lesion 10/24/2022   Urinary frequency 10/21/2022   Peripheral edema 08/17/2022   Hypotension 08/17/2022   Urinary incontinence 07/04/2022   Neurofibroma 04/23/2022   Neoplasm of unspecified behavior of bone, soft tissue, and skin 02/16/2022   Acute hearing loss, right 08/04/2021   Hyperglycemia 08/04/2021   Abscess of right foot 08/16/2020   Cellulitis of right foot 08/16/2020   Microhematuria 08/03/2020   Gastroesophageal reflux disease 07/07/2019   External otitis of right ear 03/09/2017   Thumb laceration, right, initial encounter 03/09/2017   Weakness of left side of body 11/19/2016   Stress due to illness of family member 06/10/2016    Syncope 04/23/2016   Onychomycosis 04/23/2016   Impacted ear wax 04/23/2016   Constipation due to opioid therapy 06/13/2014   Chronic pain syndrome 02/21/2014   Allergic reaction 02/20/2014   Neurofibromatosis, type 1 (HCC) 12/14/2013   Status post cervical arthrodesis 11/21/2013   Cervical myelopathy (HCC) 09/21/2013   Encounter for well adult exam with abnormal findings 06/23/2010   Disorder of autonomic nervous system 04/12/2009   Other psoriasis 08/24/2007   Tinea pedis 08/24/2007    ONSET DATE: 04-23-22  REFERRING DIAG:  Diagnosis  D49.2 (ICD-10-CM) - Neoplasm of unspecified behavior of bone, soft tissue, and skin    THERAPY DIAG:  Other abnormalities of gait and mobility  Muscle weakness (generalized)  Unsteadiness on feet  Rationale for Evaluation and Treatment: Rehabilitation  SUBJECTIVE STATEMENT:  "[name]"   Patient reports new seated back stretches were helpful. Denies falls and near falls and other acute changes. Patient reports he has a new MRI scheduled for Dec. 31 of this month.   Pt accompanied by:  spouse, [name]  PERTINENT HISTORY: s/p T11-L1 Laminectomies for intradural tumor resections & L3-L5 laminectomies for intradural tumor resections (hospitalization 04-23-22 - 04-29-22;  Neurofibromatosis 1 diagnosed in 1995  History of foot surgery 08/2020  Patient had metal removed from R foot and infected tissue removed  Neurofibromatosis (CMS/HCC)  Neurofibromatosis, type 1 (von Recklinghausen's disease) (CMS/HCC)  Status post cervical arthrodesis 11/21/2013 - cervical fusion C2-T2, laminectomy C1- C6  PAIN:   Are you having pain? No - just some stiffness in back Pain was 7-8/10 prior to surgery (on average day) - pt reports pain is much improved since surgery  PRECAUTIONS:  Fall  RED FLAGS: Bowel or bladder incontinence: Yes: Improving - pt states he has mostly bladder incontinence at night; little control over starting/stopping  WEIGHT BEARING RESTRICTIONS: No  FALLS: Has patient fallen in last 6 months? Yes. Number of falls 2  LIVING ENVIRONMENT: Lives with: lives with their spouse Lives in: House/apartment Stairs: Yes: External: 2 steps; can reach both Has following equipment at home: Dan Humphreys - 2 wheeled, Wheelchair (manual), Tour manager, and hemiwalker  PLOF: Independent with basic ADLs, Independent with household mobility without device, Independent with community mobility without device, and Independent with transfers; pt drove prior to surgery on 04-23-22 (has not driven since)  PATIENT GOALS: "walk without use of device, as I was prior to surgery"; work on balance  OBJECTIVE:   DIAGNOSTIC FINDINGS: N/A  COGNITION: Overall cognitive status: Within functional limits for tasks assessed   SENSATION: Impaired in bil. LE's   COORDINATION: Decreased in bil. LE's wi  POSTURE: forward head, decreased thoracic kyphosis, and head is laterally flexed to Lt side  LOWER EXTREMITY ROM:   WFL's except for Lt ankle ROM - pt has worn AFO for > 4 yrs Decreased Rt active dorsiflexion and plantarflexion due to weakness  LOWER EXTREMITY MMT:    MMT Right Eval Left Eval  Hip flexion 5 5  Hip extension    Hip abduction    Hip adduction    Hip internal rotation    Hip external rotation    Knee flexion 4+ (seated position) 4 (seated position)  Knee extension 5 5  Ankle dorsiflexion 3- 0-1  Ankle plantarflexion    Ankle inversion    Ankle eversion    (Blank rows = not tested)  BED MOBILITY:  Modified independent   TRANSFERS: Assistive device utilized: Environmental consultant - 2 wheeled  Sit to stand: Modified independence short distances; SBA community amb. With RW Stand to sit: Modified independence  CURB: TBA  STAIRS:  TBA  GAIT: Gait pattern: step  through pattern, decreased ankle dorsiflexion- Right, decreased ankle dorsiflexion- Left, genu recurvatum- Left, and trunk rotated posterior- Left Distance walked: 82' Assistive device utilized: Environmental consultant - 2 wheeled Level of assistance: SBA Comments: AFO worn on LLE - carbon fiber with an anterior guard added (appears to be custom fabricated - pt states he obtained this brace approx. 4 yrs ago)  FUNCTIONAL TESTS:  Timed up and go (TUG): 18.97 secs with RW but pt was not safe - pt was instructed to ambulate at his normal speed but ambulated at fast pace and was unsteady with turns so score is not totally accurate - CGA was provided for safety 10 meter  walk test: 15.94 secs with RW = 2.06 ft/sec Berg Balance Scale: 26/56    PATIENT SURVEYS:  N/A for his diagnosis - s/p laminectomies for tumor resections  TODAY'S TREATMENT: 12-31-22  Vitals:   01/04/23 1513 01/04/23 1520  BP: 106/67 102/64  Pulse: 85 67  Seated on RUE  Vitals:   01/04/23 1513 01/04/23 1520  BP: 106/67 102/64  Pulse: 85 67   Reports feeling a little lightheaded at start of session but improves with exercise and water, provided patient with water and trialed SciFit at level 4.3 x 5 minutes to attempt to increase BP; Patient BP readings intially very low in seated and standing, improved with SciFit to 97/67 and then 106/76 in standing; educated on safe BP readings for home  Gait:   Trialed gait goal for distance of 350' with RW - patient made it about 100 feet before requring transition from SBA to CGA due to catching RLE and trip with self-correction, patient made it total of 280 feet with CGA before major catching foot and increased RLE (more than even traditionally weaker LLE), decreased safety awareness of when required break with fatigue and required minA to prevent fall,  BP assessed seated and was at 102/64 - patient reporting no dizziness  PATIENT EDUCATION:  Education details: cont HEP + recommend holding on SBQC  walking until can further assess increased RLE noted in today's session  Person educated: Patient and Spouse Education method: Explanation Education comprehension: verbalized understanding  HOME EXERCISE PROGRAM:   Access Code: [email or web address] URL: [email or web address] Date: 11/03/2022 Prepared by: Maebelle Munroe  Exercises - Prone Knee Flexion AROM  - 1 x daily - 7 x weekly - 1 sets - 10 reps - 3 sec hold - Beginner Clam  - 1 x daily - 7 x weekly - 1 sets - 10 reps - Hooklying Isometric Clamshell  - 1 x daily - 7 x weekly - 1 sets - 10 reps - 3 sec hold - SEATED SIDE PLANK - LEFT SIDE  - 1 x daily - 7 x weekly - 3 sets - 10 reps   Access Code: [email or web address] URL: [email or web address] Date: 10/13/2022 Prepared by: Maryruth Eve  Exercises - Sit to Stand  - 1 x daily - 7 x weekly - 2-3 sets - 10 reps - Sit to Stand with Resistance Around Legs  - 1 x daily - 7 x weekly - 2-3 sets - 5 reps - Standing March with Unilateral Counter Support  - 1 x daily - 7 x weekly - 3 sets - 20 reps - Standing 3-Way Leg Reach with Resistance at Ankles and Counter Support  - 1 x daily - 7 x weekly - 2-3 sets - 10 reps  Walking with hemiwalker with gait belt and spouse supervision ~1x day  [employer] [email or web address]  Seated PWR! Moves (noted below) 2 x 5 reps 1x a day PWR! up  PWR! [employer]! Twist  GOALS: Goals reviewed with patient? Yes  SHORT TERM GOALS: Target date: 11-06-22 (4 weeks)   Pt will perform sit to stand transfer with 1 UE support only from standard chair and will stabilize upon initial standing without bracing with legs. Baseline:  bil. UE support needed from mat table, RUE support only no bracing w/ Les (11/04/22) Goal status: MET  2.  Improve Berg balance test score to >/= 31/56 to demo improved standing balance. Baseline: 26/56;  score 32/56 on 11-05-22 Goal status: GOAL MET  3.  Pt will amb. 115' with  hemiwalker with CGA on flat, even surface. Baseline:  gait with hemiwalker to be assessed; SBQC with CGA to min assist due to hitting object on Rt side with SBQC when amb. Around track;  Goal status: Met with hemiwalker; Goal partially met with [facility] -- 11-05-22  4.  Increase gait velocity to >/= 2.4 ft/sec with RW for increased gait efficiency. Baseline: 15.94 secs = 2.06 ft/sec; 15.06 sec = 2.18 ft/sec (11/04/22) Goal status: PROGRESSING  5.  Improve TUG score to </= 17 secs with RW with pt demonstrating correct hand placement with sit to/from stand transfers and safe speed, I.e. not rushing. Baseline: 18.97 secs with RW but not safe, 16.25 sec w/ RW and close SBA (11/04/22) Goal status: MET  6.  Independent with HEP for balance and strengthening exercises. Baseline:  Goal status: Goal met 11-05-22  LONG TERM GOALS: Target date: 12-04-22 (8 weeks)  Pt will perform sit to stand transfer without UE support from standard chair and will stabilize upon initial standing without bracing with legs. Baseline:  bil. UE support needed from mat table; RUE support only no bracing w/ Les (11/04/22): 12-03-22 - balance/stability varies depending on fatigue; pt able to perform without UE support when legs are not fatigue, otherwise, needs to brace with legs for stability upon initial standing Goal status: Partially met 12-03-22   2.  Pt will amb. 350' with quad cane with supervision on flat, even surface for increased community accessibility. Baseline: pt using RW- modified independent household, SBA community amb.;  115' x1  Goal status: Not met 12-03-22 - Goal modified due to c/o low back musc. Tightening up with use of SBQC after approx. 42' distance  3.  Improve Berg balance test score to >/= 36/56 to demo improved standing balance. Baseline: 26/56; regressed to 22/56 in today's session  Goal status: NOT MET (ongoing)  4.   Increase gait velocity to >/= 3.0 ft/sec with RW for increased gait efficiency. Baseline: 15.94 secs = 2.06 ft/sec; 15.06 sec =  2.18 ft/sec (11/04/22); 13.26 seconds = 2.47 ft/sec (11/19) Goal status: IN PROGRESS - IMPROVING  5.  Improve TUG score to </= 15 secs with hemiwalker (or quad cane) with pt demonstrating correct hand placement with sit to/from stand transfers and safe speed, I.e. not rushing. Baseline: 18.97 secs with RW but not safe; improved to 20.6 seconds with SBQC (minA); 11.95 seconds with 2WW (CGA but cues for slower pacing moving forward for safety)   Goal status: IN PROGRESS - IMPROVING   6.  Transfer floor to stand with UE support on sturdy object or furniture with supervision. Baseline: TBA Goal status: Goal met 12-03-22   NEW (REVISED) LONG TERM GOALS:  TARGET DATE 01-08-23   Pt will perform sit to stand transfer without UE support from standard chair and will stabilize upon initial standing without bracing with legs. Baseline:  bil. UE support needed from mat table; RUE support only no bracing w/ Les (11/04/22): 12-03-22 - balance/stability varies depending on fatigue; pt able to perform without UE support when legs are not fatigue, otherwise, needs to brace with legs for stability upon initial standing Goal status: Partially met 12-03-22 - ONGOING   2.   Pt will amb. 350' with RW with supervision on flat, even surface for increased community accessibility. Baseline: pt using RW- modified independent household, SBA community amb.;  115' x1  Goal status: Not met 12-03-22 - Goal modified due to c/o low back musc. Tightening up with use of SBQC after  approx. 95' distance; 280 feet with RW progressing from SBA-minA with fatigue  3.  Improve Berg balance test score to >/= 36/56 to demo improved standing balance. Baseline: 26/56; regressed to 22/56 in today's session  Goal status: NOT MET (ongoing)  4.  Increase gait velocity to >/= 3.0 ft/sec with RW for increased gait efficiency. Baseline: 15.94 secs = 2.06 ft/sec; 15.06 sec = 2.18 ft/sec (11/04/22); 13.26 seconds = 2.47 ft/sec (11/19) Goal  status: IN PROGRESS - IMPROVING  5.  Improve TUG score to </= 15 secs with RW with pt demonstrating correct hand placement with sit to/from stand transfers and safe speed, I.e. not rushing. Baseline: 18.97 secs with RW but not safe; improved to 20.6 seconds with SBQC (minA); 11.95 seconds with 2WW (CGA but cues for slower pacing moving forward for safety)   Goal status: IN PROGRESS - IMPROVING   ASSESSMENT:  CLINICAL IMPRESSION:  PT session focused on close monitoring of BP as readings lower in today's session as well as testing of gait for distance goal. Patient with new fatigue noted on RLE today resulting in to trips and tendency to push beyond safe fatigue without cueing. Patient with notable RLE foot slap. Recommend close monitoring of this side as well for new onset weakness/fatigue. Continue POC.   OBJECTIVE IMPAIRMENTS: Abnormal gait, decreased activity tolerance, decreased balance, decreased coordination, decreased knowledge of use of DME, decreased strength, decreased safety awareness, and impaired sensation.   ACTIVITY LIMITATIONS: carrying, lifting, bending, standing, squatting, stairs, transfers, locomotion level, and driving  PARTICIPATION LIMITATIONS: meal prep, cleaning, laundry, driving, shopping, community activity, and yard work  PERSONAL FACTORS: Past/current experiences and 1 comorbidity: h/o cervical tumors with s/p cervical fusion  are also affecting patient's functional outcome.   REHAB POTENTIAL: Good  CLINICAL DECISION MAKING: Evolving/moderate complexity  EVALUATION COMPLEXITY: Moderate  PLAN:  PT FREQUENCY:  2x/week  PT DURATION: 5 weeks (10 additional visits per renewal)  PLANNED INTERVENTIONS: Therapeutic exercises, Therapeutic activity, Neuromuscular re-education, Balance training, Gait training, Patient/Family education, Self Care, Stair training, Orthotic/Fit training, DME instructions, and Aquatic Therapy  PLAN FOR NEXT SESSION: work on updating POC  as needed/assess goals, watch RLE for weakness too  continue with standing balance, gait with SBQC (1 lap has been max distance prior to onset of LE weakness/instability), LE strengthening, core stabilization  work on gait training with SBQC SHORT distances (50') only:  any LLE strengthening exercises - closed chain, tall kneeling, etc.:  standing balance exercises/activities; functional strengthening (sit to stands, squats), quadruped, prone, Ramps and curbs w/ cane, core strengthening, low back stretches, balance seated on exercise ball, leg press, standing/half kneel core  Reaching forwards to R lower quarter, especially cross-body reaching, challenges core  Maryruth Eve, PT, DPT 01/04/2023, 3:38 PM

## 2023-01-08 ENCOUNTER — Other Ambulatory Visit: Payer: Self-pay

## 2023-01-08 ENCOUNTER — Other Ambulatory Visit: Payer: Self-pay | Admitting: Internal Medicine

## 2023-01-08 ENCOUNTER — Ambulatory Visit: Payer: Managed Care, Other (non HMO) | Admitting: Physical Therapy

## 2023-01-08 DIAGNOSIS — M6281 Muscle weakness (generalized): Secondary | ICD-10-CM

## 2023-01-08 DIAGNOSIS — R2681 Unsteadiness on feet: Secondary | ICD-10-CM

## 2023-01-08 DIAGNOSIS — R2689 Other abnormalities of gait and mobility: Secondary | ICD-10-CM

## 2023-01-08 NOTE — Therapy (Unsigned)
OUTPATIENT PHYSICAL THERAPY NEURO TREATMENT    Patient Name: Larry Riley MRN: [id number] DOB:1980-01-28, 42 y.o., male Today's Date: 01/09/2023   PCP: Larry Riley., MD REFERRING PROVIDER: Juliann Pares, FNP  END OF SESSION:  PT End of Session - 01/09/23 1330     Visit Number 28    Number of Visits 30    Date for PT Re-Evaluation 01/08/23    Authorization Type [employer]    Authorization - Visit Number 28    Authorization - Number of Visits 60    PT Start Time 1450    PT Stop Time 1534    PT Time Calculation (min) 44 min    Equipment Utilized During Treatment Gait belt    Activity Tolerance Patient tolerated treatment well    Behavior During Therapy WFL for tasks assessed/performed                     Past Medical History:  Diagnosis Date   Anxiety    Depression    GERD (gastroesophageal reflux disease) 01/21/2017   MRSA (methicillin resistant Staphylococcus aureus) 2005   leg   Neurofibromatosis    tumor down spine and brain   Past Surgical History:  Procedure Laterality Date   hydrocephalus     shunt 1999-[place]   NECK SURGERY      tumor removal   SPINE SURGERY  10/2013   c2-t2 fusion  , laminectomy c1-6-- Dr Raynald Kemp-- [place]   VASECTOMY  02/2013   Patient Active Problem List   Diagnosis Date Noted   Skin lesion 10/24/2022   Urinary frequency 10/21/2022   Peripheral edema 08/17/2022   Hypotension 08/17/2022   Urinary incontinence 07/04/2022   Neurofibroma 04/23/2022   Neoplasm of unspecified behavior of bone, soft tissue, and skin 02/16/2022   Acute hearing loss, right 08/04/2021   Hyperglycemia 08/04/2021   Abscess of right foot 08/16/2020   Cellulitis of right foot 08/16/2020   Microhematuria 08/03/2020   Gastroesophageal reflux disease 07/07/2019   External otitis of right ear 03/09/2017   Thumb laceration, right, initial encounter 03/09/2017   Weakness of left side of body 11/19/2016   Stress due to illness of family member  06/10/2016   Syncope 04/23/2016   Onychomycosis 04/23/2016   Impacted ear wax 04/23/2016   Constipation due to opioid therapy 06/13/2014   Chronic pain syndrome 02/21/2014   Allergic reaction 02/20/2014   Neurofibromatosis, type 1 (HCC) 12/14/2013   Status post cervical arthrodesis 11/21/2013   Cervical myelopathy (HCC) 09/21/2013   Encounter for well adult exam with abnormal findings 06/23/2010   Disorder of autonomic nervous system 04/12/2009   Other psoriasis 08/24/2007   Tinea pedis 08/24/2007    ONSET DATE: 04-23-22  REFERRING DIAG:  Diagnosis  D49.2 (ICD-10-CM) - Neoplasm of unspecified behavior of bone, soft tissue, and skin    THERAPY DIAG:  Other abnormalities of gait and mobility  Unsteadiness on feet  Muscle weakness (generalized)  Rationale for Evaluation and Treatment: Rehabilitation  SUBJECTIVE STATEMENT:  "Larry Riley"    Pt asks what equipment would be useful for him to purchase to help with exercises at home - says he has $ for health and wellness through his insurance company that he needs to use before the end of the year  Pt accompanied by:  spouse, Larry Riley  PERTINENT HISTORY: s/p T11-L1 Laminectomies for intradural tumor resections & L3-L5 laminectomies for intradural tumor resections (hospitalization 04-23-22 - 04-29-22;  Neurofibromatosis 1 diagnosed in 1995  History of foot surgery 08/2020  Patient had metal removed from R foot and infected tissue removed  Neurofibromatosis (CMS/HCC)  Neurofibromatosis, type 1 (von Recklinghausen's disease) (CMS/HCC)  Status post cervical arthrodesis 11/21/2013 - cervical fusion C2-T2, laminectomy C1- C6  PAIN:   Are you having pain? Yes: NPRS scale: 6/10 Pain location: stiffness above the hips Pain description: stiff Aggravating factors:  sitting Relieving factors: stretching Pain was 7-8/10 prior to surgery (on average day) - pt reports pain is much improved since surgery  PRECAUTIONS: Fall  RED FLAGS: Bowel or bladder incontinence: Yes: Improving - pt states he has mostly bladder incontinence at night; little control over starting/stopping  WEIGHT BEARING RESTRICTIONS: No  FALLS: Has patient fallen in last 6 months? Yes. Number of falls 2  LIVING ENVIRONMENT: Lives with: lives with their spouse Lives in: House/apartment Stairs: Yes: External: 2 steps; can reach both Has following equipment at home: Dan Humphreys - 2 wheeled, Wheelchair (manual), Tour manager, and hemiwalker  PLOF: Independent with basic ADLs, Independent with household mobility without device, Independent with community mobility without device, and Independent with transfers; pt drove prior to surgery on 04-23-22 (has not driven since)  PATIENT GOALS: "walk without use of device, as I was prior to surgery"; work on balance  OBJECTIVE:   DIAGNOSTIC FINDINGS: N/A  COGNITION: Overall cognitive status: Within functional limits for tasks assessed   SENSATION: Impaired in bil. LE's   COORDINATION: Decreased in bil. LE's wi  POSTURE: forward head, decreased thoracic kyphosis, and head is laterally flexed to Lt side  LOWER EXTREMITY ROM:   WFL's except for Lt ankle ROM - pt has worn AFO for > 4 yrs Decreased Rt active dorsiflexion and plantarflexion due to weakness  LOWER EXTREMITY MMT:    MMT Right Eval Left Eval  Hip flexion 5 5  Hip extension    Hip abduction    Hip adduction    Hip internal rotation    Hip external rotation    Knee flexion 4+ (seated position) 4 (seated position)  Knee extension 5 5  Ankle dorsiflexion 3- 0-1  Ankle plantarflexion    Ankle inversion    Ankle eversion    (Blank rows = not tested)  BED MOBILITY:  Modified independent   TRANSFERS: Assistive device utilized: Environmental consultant - 2 wheeled  Sit to stand: Modified  independence short distances; SBA community amb. With RW Stand to sit: Modified independence  CURB: TBA  STAIRS:  TBA  GAIT: Gait pattern: step through pattern, decreased ankle dorsiflexion- Right, decreased ankle dorsiflexion- Left, genu recurvatum- Left, and trunk rotated posterior- Left Distance walked: 38' Assistive device utilized: Environmental consultant - 2 wheeled Level of assistance: SBA Comments: AFO worn on LLE - carbon fiber with an anterior guard added (appears to be custom fabricated - pt states he obtained this brace approx. 4 yrs ago)  FUNCTIONAL TESTS:  Timed up and go (TUG): 18.97 secs with RW but pt was not safe - pt was instructed to ambulate at his normal speed but ambulated  at fast pace and was unsteady with turns so score is not totally accurate - CGA was provided for safety 10 meter walk test: 15.94 secs with RW = 2.06 ft/sec Berg Balance Scale: 26/56    PATIENT SURVEYS:  N/A for his diagnosis - s/p laminectomies for tumor resections     TODAY'S TREATMENT: 01-08-23  TherEx:  Stretches - used green physioball rolling it straight ahead for trunk flexion for low back stretching; rolled ball to Lt and Rt  sides x 2 reps each 10 sec hold for lateral trunk musc. stretch  Sit to stand from mat without UE support for LTG assessment - pt able to stabilize without bracing legs against mat table   NeuroRe-ed:  Berg balance test      01/08/23 0001  Berg Balance Test  Sit to Stand 3  Standing Unsupported 4  Sitting with Back Unsupported but Feet Supported on Floor or Stool 4  Stand to Sit 4  Transfers 4  Standing Unsupported with Eyes Closed 3  Standing Unsupported with Feet Together 3  From Standing, Reach Forward with Outstretched Arm 3  From Standing Position, Pick up Object from Floor 3  From Standing Position, Turn to Look Behind Over each Shoulder 1  Turn 360 Degrees 0  Standing Unsupported, Alternately Place Feet on Step/Stool 0  Standing Unsupported, One Foot in  Front 2  Standing on One Leg 0  Total Score 34    PATIENT EDUCATION:  Education details: discussed equipment appropriate for purchase through health/wellness fund through insurance Person educated: Patient and Spouse Education method: Explanation Education comprehension: verbalized understanding  HOME EXERCISE PROGRAM:   Access Code: [email or web address] URL: [email or web address] Date: 11/03/2022 Prepared by: Maebelle Munroe  Exercises - Prone Knee Flexion AROM  - 1 x daily - 7 x weekly - 1 sets - 10 reps - 3 sec hold - Beginner Clam  - 1 x daily - 7 x weekly - 1 sets - 10 reps - Hooklying Isometric Clamshell  - 1 x daily - 7 x weekly - 1 sets - 10 reps - 3 sec hold - SEATED SIDE PLANK - LEFT SIDE  - 1 x daily - 7 x weekly - 3 sets - 10 reps   Access Code: [email or web address] URL: [email or web address] Date: 10/13/2022 Prepared by: Maryruth Eve  Exercises - Sit to Stand  - 1 x daily - 7 x weekly - 2-3 sets - 10 reps - Sit to Stand with Resistance Around Legs  - 1 x daily - 7 x weekly - 2-3 sets - 5 reps - Standing March with Unilateral Counter Support  - 1 x daily - 7 x weekly - 3 sets - 20 reps - Standing 3-Way Leg Reach with Resistance at Ankles and Counter Support  - 1 x daily - 7 x weekly - 2-3 sets - 10 reps  Walking with hemiwalker with gait belt and spouse supervision ~1x day  [employer] [email or web address]  GOALS: Goals reviewed with patient? Yes  SHORT TERM GOALS: Target date: 11-06-22 (4 weeks)   Pt will perform sit to stand transfer with 1 UE support only from standard chair and will stabilize upon initial standing without bracing with legs. Baseline:  bil. UE support needed from mat table, RUE support only no bracing w/ Les (11/04/22) Goal status: MET  2.  Improve Berg balance test score to >/= 31/56 to demo improved standing balance. Baseline: 26/56;  score 32/56 on 11-05-22 Goal status: GOAL MET  3.  Pt will amb.  64' with hemiwalker with CGA on flat,  even surface. Baseline: gait with hemiwalker to be assessed; SBQC with CGA to min assist due to hitting object on Rt side with SBQC when amb. Around track Goal status: Met with hemiwalker; Goal partially met with [facility] -- 11-05-22  4.  Increase gait velocity to >/= 2.4 ft/sec with RW for increased gait efficiency. Baseline: 15.94 secs = 2.06 ft/sec; 15.06 sec = 2.18 ft/sec (11/04/22) Goal status: PROGRESSING  5.  Improve TUG score to </= 17 secs with RW with pt demonstrating correct hand placement with sit to/from stand transfers and safe speed, I.e. not rushing. Baseline: 18.97 secs with RW but not safe, 16.25 sec w/ RW and close SBA (11/04/22) Goal status: MET  6.  Independent with HEP for balance and strengthening exercises. Baseline:  Goal status: Goal met 11-05-22  LONG TERM GOALS: Target date: 12-04-22 (8 weeks)  Pt will perform sit to stand transfer without UE support from standard chair and will stabilize upon initial standing without bracing with legs. Baseline:  bil. UE support needed from mat table; RUE support only no bracing w/ Les (11/04/22): 12-03-22 - balance/stability varies depending on fatigue; pt able to perform without UE support when legs are not fatigue, otherwise, needs to brace with legs for stability upon initial standing Goal status: Partially met 12-03-22   2.  Pt will amb. 350' with quad cane with supervision on flat, even surface for increased community accessibility. Baseline: pt using RW- modified independent household, SBA community amb.;  115' x1  Goal status: Not met 12-03-22 - Goal modified due to c/o low back musc. Tightening up with use of SBQC after approx. 42' distance  3.  Improve Berg balance test score to >/= 36/56 to demo improved standing balance. Baseline: 26/56; regressed to 22/56 in today's session; score 32/56 on 11-05-22 Goal status: NOT MET - 01-08-23   4.   Increase gait velocity to >/= 3.0 ft/sec with RW for increased gait  efficiency. Baseline: 15.94 secs = 2.06 ft/sec; 15.06 sec = 2.18 ft/sec (11/04/22); 13.26 seconds = 2.47 ft/sec (11/19) Goal status: IN PROGRESS - IMPROVING  5.  Improve TUG score to </= 15 secs with hemiwalker (or quad cane) with pt demonstrating correct hand placement with sit to/from stand transfers and safe speed, I.e. not rushing. Baseline: 18.97 secs with RW but not safe; improved to 20.6 seconds with SBQC (minA); 11.95 seconds with 2WW (CGA but cues for slower pacing moving forward for safety)   Goal status: IN PROGRESS - IMPROVING   6.  Transfer floor to stand with UE support on sturdy object or furniture with supervision. Baseline: TBA Goal status: Goal met 12-03-22   NEW (REVISED) LONG TERM GOALS:  TARGET DATE 01-08-23   Pt will perform sit to stand transfer without UE support from standard chair and will stabilize upon initial standing without bracing with legs. Baseline:  bil. UE support needed from mat table; RUE support only no bracing w/ Les (11/04/22): 12-03-22 - balance/stability varies depending on fatigue; pt able to perform without UE support when legs are not fatigue, otherwise, needs to brace with legs for stability upon initial standing Goal status: Partially met 01-08-23 - pt performed from mat table but performance varies based on fatigue  2.   Pt will amb. 350' with RW with supervision on flat, even surface for increased community accessibility. Baseline: pt using RW- modified independent household, SBA community amb.;  115' x1  Goal status: Not met 12-03-22 -  Goal modified due to c/o low back musc. Tightening up with use of SBQC after approx. 2' distance  3.  Improve Berg balance test score to >/= 36/56 to demo improved standing balance. Baseline: 26/56; score 34/56 - 01-08-23 Goal status: NOT MET (ongoing)  4.  Increase gait velocity to >/= 3.0 ft/sec with RW for increased gait efficiency. Baseline: 15.94 secs = 2.06 ft/sec; 15.06 sec = 2.18 ft/sec (11/04/22);  13.26 seconds = 2.47 ft/sec (11/19) Goal status: IN PROGRESS - IMPROVING  5.  Improve TUG score to </= 15 secs with RW with pt demonstrating correct hand placement with sit to/from stand transfers and safe speed, I.e. not rushing. Baseline: 18.97 secs with RW but not safe; improved to 20.6 seconds with SBQC (minA); 11.95 seconds with 2WW (CGA but cues for slower pacing moving forward for safety)   Goal status: IN PROGRESS - IMPROVING   ASSESSMENT:  CLINICAL IMPRESSION:  PT session focused on LTG assessment in preparation for renewal.  Pt's score on Berg balance test has increased from 32/56 on 11-05-22 to 34/56 in today's session.  Goal partially met as set for score of >/= 36/56 (LTG #3 partially met).  LTG #1 met in today's session as pt performed sit to stand from mat table and able to stabilize independently without bracing legs against table - performance varies depending on fatigue and decreased muscle strength/endurance in bil. LE's.  Continue POC.   OBJECTIVE IMPAIRMENTS: Abnormal gait, decreased activity tolerance, decreased balance, decreased coordination, decreased knowledge of use of DME, decreased strength, decreased safety awareness, and impaired sensation.   ACTIVITY LIMITATIONS: carrying, lifting, bending, standing, squatting, stairs, transfers, locomotion level, and driving  PARTICIPATION LIMITATIONS: meal prep, cleaning, laundry, driving, shopping, community activity, and yard work  PERSONAL FACTORS: Past/current experiences and 1 comorbidity: h/o cervical tumors with s/p cervical fusion  are also affecting patient's functional outcome.   REHAB POTENTIAL: Good  CLINICAL DECISION MAKING: Evolving/moderate complexity  EVALUATION COMPLEXITY: Moderate  PLAN:  PT FREQUENCY:  2x/week  PT DURATION: 5 weeks (10 additional visits per renewal)  PLANNED INTERVENTIONS: Therapeutic exercises, Therapeutic activity, Neuromuscular re-education, Balance training, Gait training,  Patient/Family education, Self Care, Stair training, Orthotic/Fit training, DME instructions, and Aquatic Therapy  PLAN FOR NEXT SESSION:  finish checking LTG's - complete renewal continue with standing balance, gait with SBQC (1 lap has been max distance prior to onset of LE weakness/instability), LE strengthening, core stabilization  work on gait training with SBQC SHORT distances (50') only:  any LLE strengthening exercises - closed chain, tall kneeling, etc.:  standing balance exercises/activities; functional strengthening (sit to stands, squats), quadruped, prone, Ramps and curbs w/ cane, core strengthening, low back stretches, balance seated on exercise ball, leg press, standing/half kneel core  Reaching forwards to R lower quarter, especially cross-body reaching, challenges core    Kerry Fort, PT 01/09/2023, 1:54 PM

## 2023-01-09 ENCOUNTER — Encounter: Payer: Self-pay | Admitting: Physical Therapy

## 2023-01-09 NOTE — Progress Notes (Signed)
   01/09/23 0001  Berg Balance Test  Sit to Stand 3  Standing Unsupported 4  Sitting with Back Unsupported but Feet Supported on Floor or Stool 4  Stand to Sit 4  Transfers 4  Standing Unsupported with Eyes Closed 3  Standing Unsupported with Feet Together 3  From Standing, Reach Forward with Outstretched Arm 3  From Standing Position, Pick up Object from Floor 3  From Standing Position, Turn to Look Behind Over each Shoulder 1  Turn 360 Degrees 0  Standing Unsupported, Alternately Place Feet on Step/Stool 0  Standing Unsupported, One Foot in Front 2  Standing on One Leg 0  Total Score 34

## 2023-01-11 ENCOUNTER — Encounter: Payer: Self-pay | Admitting: Physical Therapy

## 2023-01-11 ENCOUNTER — Ambulatory Visit: Payer: Managed Care, Other (non HMO) | Admitting: Physical Therapy

## 2023-01-11 ENCOUNTER — Encounter: Payer: Self-pay | Admitting: Internal Medicine

## 2023-01-11 DIAGNOSIS — R2689 Other abnormalities of gait and mobility: Secondary | ICD-10-CM

## 2023-01-11 DIAGNOSIS — M6281 Muscle weakness (generalized): Secondary | ICD-10-CM

## 2023-01-11 DIAGNOSIS — R2681 Unsteadiness on feet: Secondary | ICD-10-CM

## 2023-01-11 NOTE — Telephone Encounter (Signed)
Sorry, I do not know how to find this information.   thanks

## 2023-01-11 NOTE — Therapy (Signed)
OUTPATIENT PHYSICAL THERAPY NEURO TREATMENT/Re-CERT   Patient Name: Larry Riley MRN: [id number] DOB:01-12-1981, 42 y.o., male Today's Date: 01/11/2023   PCP: Corwin Levins., MD REFERRING PROVIDER: Juliann Pares, FNP  END OF SESSION:  PT End of Session - 01/11/23 0920     Visit Number 29    Number of Visits 37    Date for PT Re-Evaluation 03/05/23    Authorization Type [employer]    Authorization - Visit Number 29    Authorization - Number of Visits 60    PT Start Time 0801    PT Stop Time 0846    PT Time Calculation (min) 45 min    Equipment Utilized During Treatment Gait belt    Activity Tolerance Patient tolerated treatment well    Behavior During Therapy [facility] for tasks assessed/performed                      Past Medical History:  Diagnosis Date   Anxiety    Depression    GERD (gastroesophageal reflux disease) 01/21/2017   MRSA (methicillin resistant Staphylococcus aureus) 2005   leg   Neurofibromatosis    tumor down spine and brain   Past Surgical History:  Procedure Laterality Date   hydrocephalus     shunt 1999-[place]   NECK SURGERY      tumor removal   SPINE SURGERY  10/2013   c2-t2 fusion  , laminectomy c1-6-- Dr Raynald Kemp-- [place]   VASECTOMY  02/2013   Patient Active Problem List   Diagnosis Date Noted   Skin lesion 10/24/2022   Urinary frequency 10/21/2022   Peripheral edema 08/17/2022   Hypotension 08/17/2022   Urinary incontinence 07/04/2022   Neurofibroma 04/23/2022   Neoplasm of unspecified behavior of bone, soft tissue, and skin 02/16/2022   Acute hearing loss, right 08/04/2021   Hyperglycemia 08/04/2021   Abscess of right foot 08/16/2020   Cellulitis of right foot 08/16/2020   Microhematuria 08/03/2020   Gastroesophageal reflux disease 07/07/2019   External otitis of right ear 03/09/2017   Thumb laceration, right, initial encounter 03/09/2017   Weakness of left side of body 11/19/2016   Stress due to illness of family  member 06/10/2016   Syncope 04/23/2016   Onychomycosis 04/23/2016   Impacted ear wax 04/23/2016   Constipation due to opioid therapy 06/13/2014   Chronic pain syndrome 02/21/2014   Allergic reaction 02/20/2014   Neurofibromatosis, type 1 (HCC) 12/14/2013   Status post cervical arthrodesis 11/21/2013   Cervical myelopathy (HCC) 09/21/2013   Encounter for well adult exam with abnormal findings 06/23/2010   Disorder of autonomic nervous system 04/12/2009   Other psoriasis 08/24/2007   Tinea pedis 08/24/2007    ONSET DATE: 04-23-22  REFERRING DIAG:  Diagnosis  D49.2 (ICD-10-CM) - Neoplasm of unspecified behavior of bone, soft tissue, and skin    THERAPY DIAG:  Other abnormalities of gait and mobility  Unsteadiness on feet  Muscle weakness (generalized)  Rationale for Evaluation and Treatment: Rehabilitation  SUBJECTIVE STATEMENT:  "[name]"    Pt reports they lowered the quad cane 1 notch and it seems to help reduce the tightness on Rt side of his low back and trunk - says his Rt shoulder is not as hiked up as much as it was with cane at higher height  Pt accompanied by:  spouse, [name]  PERTINENT HISTORY: s/p T11-L1 Laminectomies for intradural tumor resections & L3-L5 laminectomies for intradural tumor resections (hospitalization 04-23-22 - 04-29-22;  Neurofibromatosis 1 diagnosed in 1995  History of foot surgery 08/2020  Patient had metal removed from R foot and infected tissue removed  Neurofibromatosis (CMS/HCC)  Neurofibromatosis, type 1 (von Recklinghausen's disease) (CMS/HCC)  Status post cervical arthrodesis 11/21/2013 - cervical fusion C2-T2, laminectomy C1- C6  PAIN:   Are you having pain? Yes: NPRS scale: 6/10 Pain location: stiffness above the hips Pain description:  stiff Aggravating factors: sitting Relieving factors: stretching Pain was 7-8/10 prior to surgery (on average day) - pt reports pain is much improved since surgery  PRECAUTIONS: Fall  RED FLAGS: Bowel or bladder incontinence: Yes: Improving - pt states he has mostly bladder incontinence at night; little control over starting/stopping  WEIGHT BEARING RESTRICTIONS: No  FALLS: Has patient fallen in last 6 months? Yes. Number of falls 2  LIVING ENVIRONMENT: Lives with: lives with their spouse Lives in: House/apartment Stairs: Yes: External: 2 steps; can reach both Has following equipment at home: Dan Humphreys - 2 wheeled, Wheelchair (manual), Tour manager, and hemiwalker  PLOF: Independent with basic ADLs, Independent with household mobility without device, Independent with community mobility without device, and Independent with transfers; pt drove prior to surgery on 04-23-22 (has not driven since)  PATIENT GOALS: "walk without use of device, as I was prior to surgery"; work on balance  OBJECTIVE:   DIAGNOSTIC FINDINGS: N/A  COGNITION: Overall cognitive status: Within functional limits for tasks assessed   SENSATION: Impaired in bil. LE's   COORDINATION: Decreased in bil. LE's wi  POSTURE: forward head, decreased thoracic kyphosis, and head is laterally flexed to Lt side  LOWER EXTREMITY ROM:   WFL's except for Lt ankle ROM - pt has worn AFO for > 4 yrs Decreased Rt active dorsiflexion and plantarflexion due to weakness  LOWER EXTREMITY MMT:    MMT Right Eval Left Eval  Hip flexion 5 5  Hip extension    Hip abduction    Hip adduction    Hip internal rotation    Hip external rotation    Knee flexion 4+ (seated position) 4 (seated position)  Knee extension 5 5  Ankle dorsiflexion 3- 0-1  Ankle plantarflexion    Ankle inversion    Ankle eversion    (Blank rows = not tested)  BED MOBILITY:  Modified independent   TRANSFERS: Assistive device utilized: Environmental consultant - 2  wheeled  Sit to stand: Modified independence short distances; SBA community amb. With RW Stand to sit: Modified independence  CURB: TBA  STAIRS:  TBA  GAIT: Gait pattern: step through pattern, decreased ankle dorsiflexion- Right, decreased ankle dorsiflexion- Left, genu recurvatum- Left, and trunk rotated posterior- Left Distance walked: 5' Assistive device utilized: Environmental consultant - 2 wheeled Level of assistance: SBA Comments: AFO worn on LLE - carbon fiber with an anterior guard added (appears to be custom fabricated - pt states he obtained this brace approx. 4 yrs ago)  FUNCTIONAL TESTS:  Timed up and go (TUG): 18.97 secs with RW but pt was not safe - pt was instructed to ambulate at  his normal speed but ambulated at fast pace and was unsteady with turns so score is not totally accurate - CGA was provided for safety 10 meter walk test: 15.94 secs with RW = 2.06 ft/sec Berg Balance Scale: 26/56    PATIENT SURVEYS:  N/A for his diagnosis - s/p laminectomies for tumor resections     TODAY'S TREATMENT: 01-11-23  TherEx:  Stretches - pt performed child's pose for low back stretching;  seated trunk rotation Rt and Lt sides  GAIT: Gait pattern: step through pattern, decreased ankle dorsiflexion- Right, decreased ankle dorsiflexion- Left, genu recurvatum- Left, and trunk rotated posterior- Left Distance walked: 115' x 1; seated rest period due to c/o back tightness;  115' x 1 rep Assistive device utilized: Walker - 2 wheeled Level of assistance: CGA Comments: AFO worn on LLE - carbon fiber with an anterior guard added (appears to be custom fabricated - pt states he obtained this brace approx. 4 yrs ago)             Lt knee hyperextended in stance phase of gait;  pt had 1 occurrence of Rt toes catching after amb. 105' on 1st rep- pt attributed this to tightness in low back which resulted in leg weakness  Gait velocity = 14.44 secs with RW = 2.27 ft/sec; Lt knee hyperextension occurring in  stance  NeuroRe-ed:    TUG score:   1st and 2nd trials - pt ambulated very quickly and turned quickly/unsafely - and sat in chair with uncontrolled descent without reaching for armrest -- unable to accurately score due to decreased safety with min assist needed for safety 3rd rep -15.72 secs with RW with CGA and cues to amb. At normal speed and to reach back for chair before sitting  Standing balance exercises at counter - pt performed alternating forward, backward and side kicks 10 reps each leg with bil. UE support; marching 10 reps each leg - cues to perform small ROM and focus on balance rather than large ROM of LE's  Pt inquired about standing in tandem stance - recommended to pt that he perform standing with feet together due to tandem being too difficult at this time  PATIENT EDUCATION:  Education details: discussed medicine balls for purchase - 1 and 2 kg balls shown to pt as pt asks about appropriate equipment to purchase with his $ in wellness account Person educated: Patient and Spouse Education method: Explanation Education comprehension: verbalized understanding  HOME EXERCISE PROGRAM:   Access Code: [email or web address] URL: [email or web address] Date: 11/03/2022 Prepared by: Maebelle Munroe  Exercises - Prone Knee Flexion AROM  - 1 x daily - 7 x weekly - 1 sets - 10 reps - 3 sec hold - Beginner Clam  - 1 x daily - 7 x weekly - 1 sets - 10 reps - Hooklying Isometric Clamshell  - 1 x daily - 7 x weekly - 1 sets - 10 reps - 3 sec hold - SEATED SIDE PLANK - LEFT SIDE  - 1 x daily - 7 x weekly - 3 sets - 10 reps   Access Code: [email or web address] URL: [email or web address] Date: 10/13/2022 Prepared by: Maryruth Eve  Exercises - Sit to Stand  - 1 x daily - 7 x weekly - 2-3 sets - 10 reps - Sit to Stand with Resistance Around Legs  - 1 x daily - 7 x weekly - 2-3 sets - 5 reps - Standing March with Unilateral Counter Support  - 1 x daily - 7 x weekly -  3 sets - 20  reps - Standing 3-Way Leg Reach with Resistance at Ankles and Counter Support  - 1 x daily - 7 x weekly - 2-3 sets - 10 reps  Walking with hemiwalker with gait belt and spouse supervision ~1x day  Medbridge [email or web address]  GOALS: Goals reviewed with patient? Yes     SHORT TERM GOALS: Target date: 02-05-23 (4 weeks)  Pt will perform sit to stand transfer without UE support from standard chair 3 reps and will stabilize upon initial standing without bracing with legs. Baseline:  bil. UE support needed from mat table; RUE support only no bracing w/ Les (11/04/22): 12-03-22 - balance/stability varies depending on fatigue; pt able to perform without UE support when legs are not fatigue, otherwise, needs to brace with legs for stability upon initial standing Goal status: REVISED   2.  Pt will amb. 30' with quad cane with SBA on flat, even surface for increased community accessibility. Baseline: pt using RW- modified independent household, SBA community amb.;  115' x1  Goal status: REVISED  3.  Improve Berg balance test score to >/= 37/56 to demo improved standing balance. Baseline: 26/56; regressed to 22/56 in today's session; score 32/56 on 11-05-22:  score 34/56 on 01-08-23 Goal status: REVISED  4.   Increase gait velocity to >/= 3.0 ft/sec with RW for increased gait efficiency. Baseline: 15.94 secs = 2.06 ft/sec; 15.06 sec = 2.18 ft/sec (11/04/22); 13.26 seconds = 2.47 ft/sec (11/19):  14.44 secs with RW = 2.27 ft/sec - 01-11-23 Goal status:  ONGOING  5.  Improve TUG score to </= 15 secs with RW with pt demonstrating correct hand placement with sit to/from stand transfers and safe speed, I.e. not rushing. Baseline: 18.97 secs with RW but not safe; improved to 20.6 seconds with SBQC (minA); 11.95 seconds with 2WW (CGA but cues for slower pacing moving forward for safety)  15.72 secs with RW with CGA - 01-11-23 Goal status: ONGOING  6.  Independent in updated HEP for balance exercises.    Baseline: balance exercises added 01-11-23 Goal status: NEW   NEW (REVISED) LONG TERM GOALS:  TARGET DATE 03-05-23 (8 weeks)   Pt will perform sit to stand transfer without UE support from standard chair 5 reps and will stabilize upon initial standing without bracing with legs. Baseline:  bil. UE support needed from mat table; RUE support only no bracing w/ Les (11/04/22): 12-03-22 - balance/stability varies depending on fatigue; pt able to perform without UE support when legs are not fatigue, otherwise, needs to brace with legs for stability upon initial standing Goal status:  ONGOING -  Partially met 01-08-23 - pt performed from mat table but performance varies based on fatigue  2.   Pt will amb. 350' with RW with supervision on flat, even surface for increased community accessibility. Baseline: pt using RW- modified independent household, SBA community amb.;  115' x1  Goal status: ONGOING - Not met 01-11-23 - due to c/o low back tightening  3.  Improve Berg balance test score to >/= 40/56 to demo improved standing balance. Baseline: 26/56; score 34/56 - 01-08-23 Goal status: NOT MET (ongoing) 01-08-23  4.  Increase gait velocity to >/= 3.2 ft/sec with RW for increased gait efficiency. Baseline: 15.94 secs = 2.06 ft/sec; 15.06 sec = 2.18 ft/sec (11/04/22); 13.26 seconds = 2.47 ft/sec (11/19);  14.44 secs with RW = 2.27 ft/sec  Goal status: Upgraded - Partially met 01-11-23  5.  Improve TUG score to </=  15 secs with RW with pt demonstrating correct hand placement with sit to/from stand transfers and safe speed, I.e. not rushing. Baseline: 18.97 secs with RW but not safe; improved to 20.6 seconds with SBQC (minA); 11.95 seconds with 2WW (CGA but cues for slower pacing moving forward for safety);  15.72 secs with RW   Goal status: Partially met 01-11-23  ASSESSMENT:  CLINICAL IMPRESSION:  PT session focused on completing LTG assessment for renewal;  pt did not meet LTG #2 as he amb. 115'  x 1 rep with RW and then seated rest break needed to c/o Rt low back musc. Tightening.  Pt able to amb. 2nd lap of 115' with RW; Lt knee noted to hyperextend in stance phase of gait, which pt attributed to tightness in Rt low back musc.  Pt had 1 occurrence of Rt toes catching floor after amb. 105' with RW with CGA given for assist with balance recovery.  LTG #4 not fully met as set for gait velocity of 3.0 ft/sec with pt's gait speed = 2.27 ft/sec with RW.  LTG #5 not fully met as pt's TUG score 15.72 secs with RW with goal set at </= 15 secs with RW.  Pt continues to have decreased safety awareness with cues needed for slower gait speed to increase safety and cues for correct hand placement with sit to/from stand transfers.  Continue POC.   OBJECTIVE IMPAIRMENTS: Abnormal gait, decreased activity tolerance, decreased balance, decreased coordination, decreased knowledge of use of DME, decreased strength, decreased safety awareness, and impaired sensation.   ACTIVITY LIMITATIONS: carrying, lifting, bending, standing, squatting, stairs, transfers, locomotion level, and driving  PARTICIPATION LIMITATIONS: meal prep, cleaning, laundry, driving, shopping, community activity, and yard work  PERSONAL FACTORS: Past/current experiences and 1 comorbidity: h/o cervical tumors with s/p cervical fusion  are also affecting patient's functional outcome.   REHAB POTENTIAL: Good  CLINICAL DECISION MAKING: Evolving/moderate complexity  EVALUATION COMPLEXITY: Moderate  PLAN:  PT FREQUENCY:  1x/week  PT DURATION: 8 weeks (8 additional visits per renewal)  PLANNED INTERVENTIONS: Therapeutic exercises, Therapeutic activity, Neuromuscular re-education, Balance training, Gait training, Patient/Family education, Self Care, Stair training, Orthotic/Fit training, DME instructions, and Aquatic Therapy  PLAN FOR NEXT SESSION:  provide balance HEP  continue with standing balance, gait with SBQC (1 lap has been max  distance prior to onset of LE weakness/instability), LE strengthening, core stabilization  work on gait training with SBQC SHORT distances (50') only:  any LLE strengthening exercises - closed chain, tall kneeling, etc.:  standing balance exercises/activities; functional strengthening (sit to stands, squats), quadruped, prone, Ramps and curbs w/ cane, core strengthening, low back stretches, balance seated on exercise ball, leg press, standing/half kneel core  Reaching forwards to R lower quarter, especially cross-body reaching, challenges core    Kerry Fort, PT 01/11/2023, 9:29 AM

## 2023-01-21 ENCOUNTER — Ambulatory Visit: Payer: Managed Care, Other (non HMO) | Attending: Nurse Practitioner | Admitting: Physical Therapy

## 2023-01-21 DIAGNOSIS — M6281 Muscle weakness (generalized): Secondary | ICD-10-CM | POA: Insufficient documentation

## 2023-01-21 DIAGNOSIS — R2681 Unsteadiness on feet: Secondary | ICD-10-CM | POA: Diagnosis present

## 2023-01-21 DIAGNOSIS — R2689 Other abnormalities of gait and mobility: Secondary | ICD-10-CM | POA: Insufficient documentation

## 2023-01-21 NOTE — Therapy (Signed)
 OUTPATIENT PHYSICAL THERAPY NEURO TREATMENT   Patient Name: Larry Riley MRN: [id number] DOB:11/29/1980, 43 y.o., male Today's Date: 01/22/2023   PCP: Larry Riley REFERRING PROVIDER: Elna Elvie Browning, FNP  END OF SESSION:  PT End of Session - 01/22/23 1530     Visit Number 30    Number of Visits 37    Date for PT Re-Evaluation 03/05/23    Authorization Type [employer]    Authorization - Visit Number 1   visit 1 for 2025   Authorization - Number of Visits 60    PT Start Time 1535    PT Stop Time 1622    PT Time Calculation (min) 47 min    Equipment Utilized During Treatment Gait belt    Activity Tolerance Patient tolerated treatment well    Behavior During Therapy WFL for tasks assessed/performed                       Past Medical History:  Diagnosis Date   Anxiety    Depression    GERD (gastroesophageal reflux disease) 01/21/2017   MRSA (methicillin resistant Staphylococcus aureus) 2005   leg   Neurofibromatosis    tumor down spine and brain   Past Surgical History:  Procedure Laterality Date   hydrocephalus     shunt 1999-[place]    NECK SURGERY      tumor removal   SPINE SURGERY  10/2013   c2-t2 fusion  , laminectomy c1-6-- Dr MICHIEL-- [place]   VASECTOMY  02/2013   Patient Active Problem List   Diagnosis Date Noted   Skin lesion 10/24/2022   Urinary frequency 10/21/2022   Peripheral edema 08/17/2022   Hypotension 08/17/2022   Urinary incontinence 07/04/2022   Neurofibroma 04/23/2022   Neoplasm of unspecified behavior of bone, soft tissue, and skin 02/16/2022   Acute hearing loss, right 08/04/2021   Hyperglycemia 08/04/2021   Abscess of right foot 08/16/2020   Cellulitis of right foot 08/16/2020   Microhematuria 08/03/2020   Gastroesophageal reflux disease 07/07/2019   External otitis of right ear 03/09/2017   Thumb laceration, right, initial encounter 03/09/2017   Weakness of left side of body 11/19/2016   Stress due to illness  of family member 06/10/2016   Syncope 04/23/2016   Onychomycosis 04/23/2016   Impacted ear wax 04/23/2016   Constipation due to opioid therapy 06/13/2014   Chronic pain syndrome 02/21/2014   Allergic reaction 02/20/2014   Neurofibromatosis, type 1 (HCC) 12/14/2013   Status post cervical arthrodesis 11/21/2013   Cervical myelopathy (HCC) 09/21/2013   Encounter for well adult exam with abnormal findings 06/23/2010   Disorder of autonomic nervous system 04/12/2009   Other psoriasis 08/24/2007   Tinea pedis 08/24/2007    ONSET DATE: 04-23-22  REFERRING DIAG:  Diagnosis  D49.2 (ICD-10-CM) - Neoplasm of unspecified behavior of bone, soft tissue, and skin    THERAPY DIAG:  Other abnormalities of gait and mobility  Unsteadiness on feet  Muscle weakness (generalized)  Rationale for Evaluation and Treatment: Rehabilitation  SUBJECTIVE STATEMENT:  Larry Riley    Pt reports they ordered and received equipment for home use - got kettlebells, 4# medicine ball, and a SitFit; has been doing boxing in tall kneeling at home - feels it gives him a cardio workout as well as working on core strength  Pt accompanied by:  spouse, Larry Riley  PERTINENT HISTORY: s/p T11-L1 Laminectomies for intradural tumor resections & L3-L5 laminectomies for intradural tumor resections (hospitalization 04-23-22 - 04-29-22;  Neurofibromatosis 1 diagnosed in 1995  History of foot surgery 08/2020  Patient had metal removed from R foot and infected tissue removed  Neurofibromatosis (CMS/HCC)  Neurofibromatosis, type 1 (von Recklinghausen's disease) (CMS/HCC)  Status post cervical arthrodesis 11/21/2013 - cervical fusion C2-T2, laminectomy C1- C6  PAIN:   Are you having pain? Yes: NPRS scale: 4/10 Pain location: stiffness above the hips Pain  description: stiff Aggravating factors: sitting Relieving factors: stretching Pain was 7-8/10 prior to surgery (on average day) - pt reports pain is much improved since surgery  PRECAUTIONS: Fall  RED FLAGS: Bowel or bladder incontinence: Yes: Improving - pt states he has mostly bladder incontinence at night; little control over starting/stopping  WEIGHT BEARING RESTRICTIONS: No  FALLS: Has patient fallen in last 6 months? Yes. Number of falls 2  LIVING ENVIRONMENT: Lives with: lives with their spouse Lives in: House/apartment Stairs: Yes: External: 2 steps; can reach both Has following equipment at home: Vannie - 2 wheeled, Wheelchair (manual), Tour manager, and hemiwalker  PLOF: Independent with basic ADLs, Independent with household mobility without device, Independent with community mobility without device, and Independent with transfers; pt drove prior to surgery on 04-23-22 (has not driven since)  PATIENT GOALS: walk without use of device, as I was prior to surgery; work on balance  OBJECTIVE:   DIAGNOSTIC FINDINGS: N/A  COGNITION: Overall cognitive status: Within functional limits for tasks assessed   SENSATION: Impaired in bil. LE's   COORDINATION: Decreased in bil. LE's wi  POSTURE: forward head, decreased thoracic kyphosis, and head is laterally flexed to Lt side  LOWER EXTREMITY ROM:   WFL's except for Lt ankle ROM - pt has worn AFO for > 4 yrs Decreased Rt active dorsiflexion and plantarflexion due to weakness  LOWER EXTREMITY MMT:    MMT Right Eval Left Eval  Hip flexion 5 5  Hip extension    Hip abduction    Hip adduction    Hip internal rotation    Hip external rotation    Knee flexion 4+ (seated position) 4 (seated position)  Knee extension 5 5  Ankle dorsiflexion 3- 0-1  Ankle plantarflexion    Ankle inversion    Ankle eversion    (Blank rows = not tested)  BED MOBILITY:  Modified independent   TRANSFERS: Assistive device utilized:  Environmental Consultant - 2 wheeled  Sit to stand: Modified independence short distances; SBA community amb. With RW Stand to sit: Modified independence  CURB: TBA  STAIRS:  TBA  GAIT: Gait pattern: step through pattern, decreased ankle dorsiflexion- Right, decreased ankle dorsiflexion- Left, genu recurvatum- Left, and trunk rotated posterior- Left Distance walked: 70' Assistive device utilized: Environmental Consultant - 2 wheeled Level of assistance: SBA Comments: AFO worn on LLE - carbon fiber with an anterior guard added (appears to be custom fabricated - pt states he obtained this brace approx. 4 yrs ago)  FUNCTIONAL TESTS:  Timed up and go (TUG): 18.97 secs with RW but pt was not safe - pt was instructed to ambulate at his normal speed  but ambulated at fast pace and was unsteady with turns so score is not totally accurate - CGA was provided for safety 10 meter walk test: 15.94 secs with RW = 2.06 ft/sec Berg Balance Scale: 26/56    PATIENT SURVEYS:  N/A for his diagnosis - s/p laminectomies for tumor resections     TODAY'S TREATMENT: 01-21-23  TherEx:  Leg press - bil. LE's 120# 25 reps;  attempted 90# RLE but leg too tired; attempted 80# LLE but leg too tired after performing exercise with bil. LE's Sit to stand from high/low mat table 3-5 reps during session - no UE support used   GAIT: Gait pattern: step through pattern, decreased ankle dorsiflexion- Right, decreased ankle dorsiflexion- Left, genu recurvatum- Left, and trunk rotated posterior- Left Distance walked: clinic distances; inside // bars 10' x 4 reps (forward/backward x 2 reps) without UE support on bars Assistive device utilized: Walker - 2 wheeled Level of assistance: CGA Comments: AFO worn on LLE - carbon fiber with an anterior guard added (appears to be custom fabricated - pt states he obtained this brace approx. 4 yrs ago)             Lt knee hyperextended in stance phase of gait;  pt had 1 occurrence of Rt toes catching after amb. 105' on  1st rep- pt attributed this to tightness in low back which resulted in leg weakness  Gait velocity = 14.44 secs with RW = 2.27 ft/sec; Lt knee hyperextension occurring in stance  NeuroRe-ed:    Standing balance exercises at counter - pt performed alternating forward, backward and side kicks 10 reps each leg with bil. UE support; marching 10 reps each leg - cues to perform small ROM and focus on balance rather than large ROM of LE's Pt stood unsupported at cabinet - performed head turns horizontally 5 reps with CGA  Tap ups to 4 step inside // bars with UE support prn - 10 reps each leg  TherAct: Pt stood unsupported by mat - reached for 5 bean bags placed on his left side and 5 bean bags placed on Rt side on mat behind him - retrieved each one individually and tossed in basket; 2nd rep - placed bean bags a littler further out of reach for improved balance with reaching outside BOS - CGA to min assist on 2nd rep for balance and safety  PATIENT EDUCATION:  Education details: instructed pt and wife to perform squats at sink with chair behind him for safety; informed wife and pt that 4# medicine ball can be used to perform slam ball in standing for more challenge but to perform leaning up against cabinet initially, as pt would not be able to perform unsupported in standing; demonstrated progressing tall kneeling position to 1/2 kneeling on each leg (pt stated knee pads would be needed for knee comfort); also discussed amb. On knees in tall kneeling position Person educated: Patient and Spouse Education method: Explanation Education comprehension: verbalized understanding  HOME EXERCISE PROGRAM:   Access Code: [email or web address] URL: [email or web address] Date: 11/03/2022 Prepared by: Rock Kussmaul  Exercises - Prone Knee Flexion AROM  - 1 x daily - 7 x weekly - 1 sets - 10 reps - 3 sec hold - Beginner Clam  - 1 x daily - 7 x weekly - 1 sets - 10 reps - Hooklying Isometric Clamshell  - 1  x daily - 7 x weekly - 1 sets - 10 reps - 3 sec hold - SEATED SIDE PLANK -  LEFT SIDE  - 1 x daily - 7 x weekly - 3 sets - 10 reps   Access Code: [email or web address] URL: [email or web address] Date: 10/13/2022 Prepared by: Lauraine Grumbling  Exercises - Sit to Stand  - 1 x daily - 7 x weekly - 2-3 sets - 10 reps - Sit to Stand with Resistance Around Legs  - 1 x daily - 7 x weekly - 2-3 sets - 5 reps - Standing March with Unilateral Counter Support  - 1 x daily - 7 x weekly - 3 sets - 20 reps - Standing 3-Way Leg Reach with Resistance at Ankles and Counter Support  - 1 x daily - 7 x weekly - 2-3 sets - 10 reps  Walking with hemiwalker with gait belt and spouse supervision ~1x day  Medbridge [email or web address]  GOALS: Goals reviewed with patient? Yes     SHORT TERM GOALS: Target date: 02-05-23 (4 weeks)  Pt will perform sit to stand transfer without UE support from standard chair 3 reps and will stabilize upon initial standing without bracing with legs. Baseline:  bil. UE support needed from mat table; RUE support only no bracing w/ Les (11/04/22): 12-03-22 - balance/stability varies depending on fatigue; pt able to perform without UE support when legs are not fatigue, otherwise, needs to brace with legs for stability upon initial standing Goal status: REVISED   2.  Pt will amb. 30' with quad cane with SBA on flat, even surface for increased community accessibility. Baseline: pt using RW- modified independent household, SBA community amb.;  115' x1  Goal status: REVISED  3.  Improve Berg balance test score to >/= 37/56 to demo improved standing balance. Baseline: 26/56; regressed to 22/56 in today's session; score 32/56 on 11-05-22:  score 34/56 on 01-08-23 Goal status: REVISED  4.   Increase gait velocity to >/= 3.0 ft/sec with RW for increased gait efficiency. Baseline: 15.94 secs = 2.06 ft/sec; 15.06 sec = 2.18 ft/sec (11/04/22); 13.26 seconds = 2.47 ft/sec (11/19):  14.44 secs with  RW = 2.27 ft/sec - 01-11-23 Goal status:  ONGOING  5.  Improve TUG score to </= 15 secs with RW with pt demonstrating correct hand placement with sit to/from stand transfers and safe speed, I.e. not rushing. Baseline: 18.97 secs with RW but not safe; improved to 20.6 seconds with SBQC (minA); 11.95 seconds with 2WW (CGA but cues for slower pacing moving forward for safety)  15.72 secs with RW with CGA - 01-11-23 Goal status: ONGOING  6.  Independent in updated HEP for balance exercises.   Baseline: balance exercises added 01-11-23 Goal status: NEW   NEW (REVISED) LONG TERM GOALS:  TARGET DATE 03-05-23 (8 weeks)   Pt will perform sit to stand transfer without UE support from standard chair 5 reps and will stabilize upon initial standing without bracing with legs. Baseline:  bil. UE support needed from mat table; RUE support only no bracing w/ Les (11/04/22): 12-03-22 - balance/stability varies depending on fatigue; pt able to perform without UE support when legs are not fatigue, otherwise, needs to brace with legs for stability upon initial standing Goal status:  ONGOING -  Partially met 01-08-23 - pt performed from mat table but performance varies based on fatigue  2.   Pt will amb. 350' with RW with supervision on flat, even surface for increased community accessibility. Baseline: pt using RW- modified independent household, SBA community amb.;  115' x1  Goal status: ONGOING - Not met 01-11-23 - due to  c/o low back tightening  3.  Improve Berg balance test score to >/= 40/56 to demo improved standing balance. Baseline: 26/56; score 34/56 - 01-08-23 Goal status: NOT MET (ongoing) 01-08-23  4.  Increase gait velocity to >/= 3.2 ft/sec with RW for increased gait efficiency. Baseline: 15.94 secs = 2.06 ft/sec; 15.06 sec = 2.18 ft/sec (11/04/22); 13.26 seconds = 2.47 ft/sec (11/19);  14.44 secs with RW = 2.27 ft/sec  Goal status: Upgraded - Partially met 01-11-23  5.  Improve TUG score to  </= 15 secs with RW with pt demonstrating correct hand placement with sit to/from stand transfers and safe speed, I.e. not rushing. Baseline: 18.97 secs with RW but not safe; improved to 20.6 seconds with SBQC (minA); 11.95 seconds with 2WW (CGA but cues for slower pacing moving forward for safety);  15.72 secs with RW   Goal status: Partially met 01-11-23  ASSESSMENT:  CLINICAL IMPRESSION:  PT session focused on standing balance exercises and standing activity to improve standing balance with unsupported standing and reaching outside BOS and LE strengthening in closed chain position as pt continues to have decreased muscle endurance and decreased muscle strength in bil. LE's.  Pt noted to have decreased SLS on RLE when performing standing hip abduction for balance exercise, due to decreased stability of Rt knee compared to Lt knee, as AFO on LLE provides support with knee extension.  Pt continues to fatigue quickly with unsupported standing.  Continue POC.   OBJECTIVE IMPAIRMENTS: Abnormal gait, decreased activity tolerance, decreased balance, decreased coordination, decreased knowledge of use of DME, decreased strength, decreased safety awareness, and impaired sensation.   ACTIVITY LIMITATIONS: carrying, lifting, bending, standing, squatting, stairs, transfers, locomotion level, and driving  PARTICIPATION LIMITATIONS: meal prep, cleaning, laundry, driving, shopping, community activity, and yard work  PERSONAL FACTORS: Past/current experiences and 1 comorbidity: h/o cervical tumors with s/p cervical fusion  are also affecting patient's functional outcome.   REHAB POTENTIAL: Good  CLINICAL DECISION MAKING: Evolving/moderate complexity  EVALUATION COMPLEXITY: Moderate  PLAN:  PT FREQUENCY:  1x/week  PT DURATION: 8 weeks (8 additional visits per renewal)  PLANNED INTERVENTIONS: Therapeutic exercises, Therapeutic activity, Neuromuscular re-education, Balance training, Gait training,  Patient/Family education, Self Care, Stair training, Orthotic/Fit training, DME instructions, and Aquatic Therapy  PLAN FOR NEXT SESSION:  check balance HEP; tall kneeling, 1/2 kneeling? continue with standing balance, gait with SBQC (1 lap has been max distance prior to onset of LE weakness/instability), LE strengthening, core stabilization  work on gait training with SBQC SHORT distances (50') only:  any LLE strengthening exercises - closed chain, tall kneeling, etc.:  standing balance exercises/activities; functional strengthening (sit to stands, squats), quadruped, prone, Ramps and curbs w/ cane, core strengthening, low back stretches, balance seated on exercise ball, leg press, standing/half kneel core  Reaching forwards to R lower quarter, especially cross-body reaching, challenges core    Elvie Kussmaul, PT 01/22/2023, 3:34 PM

## 2023-01-22 ENCOUNTER — Encounter: Payer: Self-pay | Admitting: Physical Therapy

## 2023-01-27 ENCOUNTER — Ambulatory Visit: Payer: 59 | Admitting: Behavioral Health

## 2023-01-27 ENCOUNTER — Encounter: Payer: Self-pay | Admitting: Behavioral Health

## 2023-01-27 DIAGNOSIS — F411 Generalized anxiety disorder: Secondary | ICD-10-CM

## 2023-01-27 DIAGNOSIS — F331 Major depressive disorder, recurrent, moderate: Secondary | ICD-10-CM

## 2023-01-27 NOTE — Progress Notes (Signed)
 [facility] Counselor/Therapist Progress Note  Patient ID: Larry Riley, MRN: [id number],    Date: 01/27/2023  Time Spent: 55 minutes, 11 AM until 11:55 AM this session was held via video teletherapy. The patient consented to the video teletherapy and was located in his home office during this session.  He is aware it is the responsibility of the patient to secure confidentiality on his end of the session. The provider was in a private home office for the duration of this session.      Treatment Type: Individual Therapy  Reported Symptoms: Anxiety/stress  Mental Status Exam: Appearance:  Casual     Behavior: Appropriate  Motor: Normal  Speech/Language:  Normal Rate  Affect: Appropriate  Mood: normal  Thought process: normal  Thought content:   WNL  Sensory/Perceptual disturbances:   WNL  Orientation: oriented to person, place, time/date, situation, day of week, and month of year  Attention: Good  Concentration: Good  Memory: WNL  Fund of knowledge:  Good  Insight:   Good  Judgment:  Good  Impulse Control: Good   Risk Assessment: Danger to Self:  No Self-injurious Behavior: No Danger to Others: No Duty to Warn:no Physical Aggression / Violence:No  Access to Firearms a concern: No  Gang Involvement:No   Subjective: The session started with audio difficulty.  I could hear the patient but he could not hear me and I checked all of my audio and video settings but he could not hear me.  We switched to a phone session to complete the session.  The patient's biggest stressor now is with his father's health.  There still has not been cognitive testing but he can tell in the phone conversations that there seems to be a digression in his father's mental and emotional and intellectual help.  At times he is clear but other times his father is confused.  There is some confusion because his former caregiver is not supposed to be involved with the father but still somehow was  reaching out to the father.  There have been conflicting reports between old caregiver, new caregiver and the county as to what is going on with his father's health.  There were some bedbugs at his old environment but his father keeps talking about wanting to go back there and the new caregiver says that the patient's father needs to stay where he is.  He is talking to the county.  He is questioning if there might need to be legal action taken.  He knows that he is doing the best that he can from a distance away to take care of his father.  He has gotten no feedback from his brother or sister in terms of them trying to lay eyes on the situation.  The patient still does not feel like he is in good physical health to fly to [place]  and to be able to handle some of these things.  Intellectually and emotionally he feels that the patient is at peace with the way he is handling things but hates that these things are up in the air in terms of his father's health.  As far as his own health he is working hard both in outpatient and at home therapy and feels he is making progress.  He knows that he is getting stronger and is staying determined with that.  There is just certain muscle groups that are re firing as quickly or the way he would want them to but he feels optimistic that  will happen if he continues to put in hard work and he is.  He did acknowledge that he feels that there is still coming continued irritability and frustration which he does not like.  He has been using grounding exercises and breathing which helps some.  There is a physical thing he can do with a therapy ball which she is going to start doing and I encouraged but we also introduced the tips skill breaking down each part of that including going through progressive muscle relaxation with him.  As we were without video I introduced verbally the upper part of the body and the progressive muscle relaxation cycle and we will introduce the rest of it in  her next session via video.  He does contract for safety having no thoughts of hurting himself or anyone else. Interventions: Cognitive Behavioral Therapy  Diagnosis: Generalized anxiety disorder  Plan: I will meet with the patient every 2 to 3 weeks via care agility.  Treatment plan: We will use cognitive behavioral therapy as well as dialectical behavior therapy to help the patient reduce anxiety/stress primarily related to medical issues.  The goal is to reduce anxiety by 50% with a target date of February 12, 2023.  Goals for reducing anxiety include helping him manage thoughts and worrisome thinking contributing to feelings of anxiety as well as recognition of those thoughts.  We will look to resolve any core conflicts outside of the medical and health issues that are contributing to anxiety including family situations and relationships.  In addition to that we will identify causes for anxiety and explore ways to lower it as well as improve his ability to manage anxiety symptoms and better handle stress.  Interventions will be to provide education about anxiety and stress to help him identify causes and symptoms.  Introduce coping skills and problem solution skills to manage anxiety including DBT distress tolerance and mindfulness skills.  We will also use cognitive behavioral therapy to identify and change anxiety provoking thought and behavior patterns. Progress: 35% Cecile Coder, [facility]                  Cecile Coder, [facility]               Cecile Coder, [facility]               Cecile Coder, [facility]               Cecile Coder, [facility]               Cecile Coder, [facility]               Cecile Coder, [facility]               Cecile Coder, [facility]

## 2023-01-28 ENCOUNTER — Ambulatory Visit: Payer: Managed Care, Other (non HMO) | Admitting: Physical Therapy

## 2023-01-28 DIAGNOSIS — R2689 Other abnormalities of gait and mobility: Secondary | ICD-10-CM

## 2023-01-28 DIAGNOSIS — R2681 Unsteadiness on feet: Secondary | ICD-10-CM

## 2023-01-28 DIAGNOSIS — M6281 Muscle weakness (generalized): Secondary | ICD-10-CM

## 2023-01-28 NOTE — Therapy (Signed)
OUTPATIENT PHYSICAL THERAPY NEURO TREATMENT   Patient Name: Larry Riley MRN: [id number] DOB:1980/07/31, 43 y.o., male Today's Date: 01/29/2023   PCP: Larry Levins., MD REFERRING PROVIDER: Juliann Pares, FNP  END OF SESSION:  PT End of Session - 01/29/23 1659     Visit Number 31    Number of Visits 37    Date for PT Re-Evaluation 03/05/23    Authorization Type [employer]    Authorization - Visit Number 2    Authorization - Number of Visits 60    PT Start Time 1535    PT Stop Time 1617    PT Time Calculation (min) 42 min    Equipment Utilized During Treatment Gait belt    Activity Tolerance Patient tolerated treatment well    Behavior During Therapy WFL for tasks assessed/performed                        Past Medical History:  Diagnosis Date   Anxiety    Depression    GERD (gastroesophageal reflux disease) 01/21/2017   MRSA (methicillin resistant Staphylococcus aureus) 2005   leg   Neurofibromatosis    tumor down spine and brain   Past Surgical History:  Procedure Laterality Date   hydrocephalus     shunt 1999-[place]   NECK SURGERY      tumor removal   SPINE SURGERY  10/2013   c2-t2 fusion  , laminectomy c1-6-- Dr Raynald Kemp-- [place]   VASECTOMY  02/2013   Patient Active Problem List   Diagnosis Date Noted   Skin lesion 10/24/2022   Urinary frequency 10/21/2022   Peripheral edema 08/17/2022   Hypotension 08/17/2022   Urinary incontinence 07/04/2022   Neurofibroma 04/23/2022   Neoplasm of unspecified behavior of bone, soft tissue, and skin 02/16/2022   Acute hearing loss, right 08/04/2021   Hyperglycemia 08/04/2021   Abscess of right foot 08/16/2020   Cellulitis of right foot 08/16/2020   Microhematuria 08/03/2020   Gastroesophageal reflux disease 07/07/2019   External otitis of right ear 03/09/2017   Thumb laceration, right, initial encounter 03/09/2017   Weakness of left side of body 11/19/2016   Stress due to illness of family  member 06/10/2016   Syncope 04/23/2016   Onychomycosis 04/23/2016   Impacted ear wax 04/23/2016   Constipation due to opioid therapy 06/13/2014   Chronic pain syndrome 02/21/2014   Allergic reaction 02/20/2014   Neurofibromatosis, type 1 (HCC) 12/14/2013   Status post cervical arthrodesis 11/21/2013   Cervical myelopathy (HCC) 09/21/2013   Encounter for well adult exam with abnormal findings 06/23/2010   Disorder of autonomic nervous system 04/12/2009   Other psoriasis 08/24/2007   Tinea pedis 08/24/2007    ONSET DATE: 04-23-22  REFERRING DIAG:  Diagnosis  D49.2 (ICD-10-CM) - Neoplasm of unspecified behavior of bone, soft tissue, and skin    THERAPY DIAG:  Other abnormalities of gait and mobility  Unsteadiness on feet  Muscle weakness (generalized)  Rationale for Evaluation and Treatment: Rehabilitation  SUBJECTIVE STATEMENT:  "Larry Riley"    Pt reports he did not feel well at all yesterday (Wed.) - feels better today but not as good as he did last week, due to having GI issues today; states back feels tighter, especially on Rt side just above the pelvis near to spine - asks how to strengthen this triangular muscle (lattisimus?)  Pt accompanied by:  spouse, [name]  PERTINENT HISTORY: s/p T11-L1 Laminectomies for intradural tumor resections & L3-L5 laminectomies for intradural tumor resections (hospitalization 04-23-22 - 04-29-22;  Neurofibromatosis 1 diagnosed in 1995  History of foot surgery 08/2020  Patient had metal removed from R foot and infected tissue removed  Neurofibromatosis (CMS/HCC)  Neurofibromatosis, type 1 (von Recklinghausen's disease) (CMS/HCC)  Status post cervical arthrodesis 11/21/2013 - cervical fusion C2-T2, laminectomy C1- C6  PAIN:   Are you having pain? Yes: NPRS scale:  6/10 Pain location: stiffness in neck and tailbone Pain description: stiff Aggravating factors: sitting Relieving factors: stretching Pain was 7-8/10 prior to surgery (on average day) - pt reports pain is much improved since surgery  PRECAUTIONS: Fall  RED FLAGS: Bowel or bladder incontinence: Yes: Improving - pt states he has mostly bladder incontinence at night; little control over starting/stopping  WEIGHT BEARING RESTRICTIONS: No  FALLS: Has patient fallen in last 6 months? Yes. Number of falls 2  LIVING ENVIRONMENT: Lives with: lives with their spouse Lives in: House/apartment Stairs: Yes: External: 2 steps; can reach both Has following equipment at home: Dan Humphreys - 2 wheeled, Wheelchair (manual), Tour manager, and hemiwalker  PLOF: Independent with basic ADLs, Independent with household mobility without device, Independent with community mobility without device, and Independent with transfers; pt drove prior to surgery on 04-23-22 (has not driven since)  PATIENT GOALS: "walk without use of device, as I was prior to surgery"; work on balance  OBJECTIVE:   DIAGNOSTIC FINDINGS: N/A  COGNITION: Overall cognitive status: Within functional limits for tasks assessed   SENSATION: Impaired in bil. LE's   COORDINATION: Decreased in bil. LE's wi  POSTURE: forward head, decreased thoracic kyphosis, and head is laterally flexed to Lt side  LOWER EXTREMITY ROM:   WFL's except for Lt ankle ROM - pt has worn AFO for > 4 yrs Decreased Rt active dorsiflexion and plantarflexion due to weakness  LOWER EXTREMITY MMT:    MMT Right Eval Left Eval  Hip flexion 5 5  Hip extension    Hip abduction    Hip adduction    Hip internal rotation    Hip external rotation    Knee flexion 4+ (seated position) 4 (seated position)  Knee extension 5 5  Ankle dorsiflexion 3- 0-1  Ankle plantarflexion    Ankle inversion    Ankle eversion    (Blank rows = not tested)  BED MOBILITY:  Modified  independent   TRANSFERS: Assistive device utilized: Environmental consultant - 2 wheeled  Sit to stand: Modified independence short distances; SBA community amb. With RW Stand to sit: Modified independence  CURB: TBA  STAIRS:  TBA  GAIT: Gait pattern: step through pattern, decreased ankle dorsiflexion- Right, decreased ankle dorsiflexion- Left, genu recurvatum- Left, and trunk rotated posterior- Left Distance walked: 40' Assistive device utilized: Environmental consultant - 2 wheeled Level of assistance: SBA Comments: AFO worn on LLE - carbon fiber with an anterior guard added (appears to be custom fabricated - pt states he obtained this brace approx. 4 yrs ago)  FUNCTIONAL TESTS:  Timed up and go (TUG): 18.97 secs with RW but pt was  not safe - pt was instructed to ambulate at his normal speed but ambulated at fast pace and was unsteady with turns so score is not totally accurate - CGA was provided for safety 10 meter walk test: 15.94 secs with RW = 2.06 ft/sec Berg Balance Scale: 26/56    PATIENT SURVEYS:  N/A for his diagnosis - s/p laminectomies for tumor resections     TODAY'S TREATMENT: 01-28-23  TherEx: Seated stretching with green physioball - rolling forward and then to Rt side/Lt side  Recommended modified child's pose position with hands to Lt side and hips toward Rt heel for stretching this area of low back - performed 1 rep for approx. 15 sec hold each position Sit to stand from high/low mat table approx. 3 reps during session - no UE support used - CGA to SBA  GAIT: Gait pattern: step through pattern, decreased ankle dorsiflexion- Right, decreased ankle dorsiflexion- Left, genu recurvatum- Left, and trunk rotated posterior- Left Distance walked: clinic distances; 115' (1 rep only due to fatigue of LE's) Assistive device utilized: SBQC Level of assistance: CGA Comments: AFO worn on LLE - carbon fiber with an anterior guard added (appears to be custom fabricated - pt states he obtained this brace  approx. 4 yrs ago)             Cues to make turns slowly to prevent LOB and also cues to attend to objects on Rt side to avoid hitting them during gait as pt ambulates very close to objects at times  Gait velocity = 14.44 secs with RW = 2.27 ft/sec; Lt knee hyperextension occurring in stance  NeuroRe-ed:    Standing balance exercises by side of mat with use of RW for UE support - used 3 discs for targets - pt performed SLS on each leg by touching each disc with each foot - initially with full hand support then with 3 finger support  Performed stepping over and back of yard stick with each LE  5 reps each with UE support on RW  TherAct: Pt in tall kneeling position - used Airex for cushioning for both knees for increased comfort;  pt initially performed lifting with 5# kettlebell, then diagonal lifts "X" 5 reps each;  2# dumbbell used for trunk rotation 3 reps Used 5 cones - transferred each cone individually from Lt side to place on Rt side on computer table to increase balance with reaching outside BOS and for improved core stabilization and strength;  then transferred each cone from Rt side back to Lt side to increase balance with reaching and with trunk rotation  PATIENT EDUCATION:  Education details: instructed pt and wife to perform squats at sink with chair behind him for safety; informed wife and pt that 4# medicine ball can be used to perform slam ball in standing for more challenge but to perform leaning up against cabinet initially, as pt would not be able to perform unsupported in standing; demonstrated progressing tall kneeling position to 1/2 kneeling on each leg (pt stated knee pads would be needed for knee comfort); also discussed amb. On knees in tall kneeling position Person educated: Patient and Spouse Education method: Explanation Education comprehension: verbalized understanding  HOME EXERCISE PROGRAM:   Access Code: [email or web address] URL: [email or web address] Date:  11/03/2022 Prepared by: Maebelle Munroe  Exercises - Prone Knee Flexion AROM  - 1 x daily - 7 x weekly - 1 sets - 10 reps - 3 sec hold - Beginner Clam  - 1 x daily -  7 x weekly - 1 sets - 10 reps - Hooklying Isometric Clamshell  - 1 x daily - 7 x weekly - 1 sets - 10 reps - 3 sec hold - SEATED SIDE PLANK - LEFT SIDE  - 1 x daily - 7 x weekly - 3 sets - 10 reps   Access Code: [email or web address] URL: [email or web address] Date: 10/13/2022 Prepared by: Maryruth Eve  Exercises - Sit to Stand  - 1 x daily - 7 x weekly - 2-3 sets - 10 reps - Sit to Stand with Resistance Around Legs  - 1 x daily - 7 x weekly - 2-3 sets - 5 reps - Standing March with Unilateral Counter Support  - 1 x daily - 7 x weekly - 3 sets - 20 reps - Standing 3-Way Leg Reach with Resistance at Ankles and Counter Support  - 1 x daily - 7 x weekly - 2-3 sets - 10 reps  Walking with hemiwalker with gait belt and spouse supervision ~1x day  [employer] [email or web address]  GOALS: Goals reviewed with patient? Yes     SHORT TERM GOALS: Target date: 02-05-23 (4 weeks)  Pt will perform sit to stand transfer without UE support from standard chair 3 reps and will stabilize upon initial standing without bracing with legs. Baseline:  bil. UE support needed from mat table; RUE support only no bracing w/ Les (11/04/22): 12-03-22 - balance/stability varies depending on fatigue; pt able to perform without UE support when legs are not fatigue, otherwise, needs to brace with legs for stability upon initial standing Goal status: REVISED   2.  Pt will amb. 30' with quad cane with SBA on flat, even surface for increased community accessibility. Baseline: pt using RW- modified independent household, SBA community amb.;  115' x1  Goal status: REVISED  3.  Improve Berg balance test score to >/= 37/56 to demo improved standing balance. Baseline: 26/56; regressed to 22/56 in today's session; score 32/56 on 11-05-22:  score 34/56 on  01-08-23 Goal status: REVISED  4.   Increase gait velocity to >/= 3.0 ft/sec with RW for increased gait efficiency. Baseline: 15.94 secs = 2.06 ft/sec; 15.06 sec = 2.18 ft/sec (11/04/22); 13.26 seconds = 2.47 ft/sec (11/19):  14.44 secs with RW = 2.27 ft/sec - 01-11-23 Goal status:  ONGOING  5.  Improve TUG score to </= 15 secs with RW with pt demonstrating correct hand placement with sit to/from stand transfers and safe speed, I.e. not rushing. Baseline: 18.97 secs with RW but not safe; improved to 20.6 seconds with SBQC (minA); 11.95 seconds with 2WW (CGA but cues for slower pacing moving forward for safety)  15.72 secs with RW with CGA - 01-11-23 Goal status: ONGOING  6.  Independent in updated HEP for balance exercises.   Baseline: balance exercises added 01-11-23 Goal status: NEW   NEW (REVISED) LONG TERM GOALS:  TARGET DATE 03-05-23 (8 weeks)   Pt will perform sit to stand transfer without UE support from standard chair 5 reps and will stabilize upon initial standing without bracing with legs. Baseline:  bil. UE support needed from mat table; RUE support only no bracing w/ Les (11/04/22): 12-03-22 - balance/stability varies depending on fatigue; pt able to perform without UE support when legs are not fatigue, otherwise, needs to brace with legs for stability upon initial standing Goal status:  ONGOING -  Partially met 01-08-23 - pt performed from mat table but performance varies based on fatigue  2.   Pt will amb.  350' with RW with supervision on flat, even surface for increased community accessibility. Baseline: pt using RW- modified independent household, SBA community amb.;  115' x1  Goal status: ONGOING - Not met 01-11-23 - due to c/o low back tightening  3.  Improve Berg balance test score to >/= 40/56 to demo improved standing balance. Baseline: 26/56; score 34/56 - 01-08-23 Goal status: NOT MET (ongoing) 01-08-23  4.  Increase gait velocity to >/= 3.2 ft/sec with RW for  increased gait efficiency. Baseline: 15.94 secs = 2.06 ft/sec; 15.06 sec = 2.18 ft/sec (11/04/22); 13.26 seconds = 2.47 ft/sec (11/19);  14.44 secs with RW = 2.27 ft/sec  Goal status: Upgraded - Partially met 01-11-23  5.  Improve TUG score to </= 15 secs with RW with pt demonstrating correct hand placement with sit to/from stand transfers and safe speed, I.e. not rushing. Baseline: 18.97 secs with RW but not safe; improved to 20.6 seconds with SBQC (minA); 11.95 seconds with 2WW (CGA but cues for slower pacing moving forward for safety);  15.72 secs with RW   Goal status: Partially met 01-11-23  ASSESSMENT:  CLINICAL IMPRESSION:  PT session focused on gait training with use of SBQC, core stabilization, and standing balance with UE support prn.  Pt had increased LE fatigue in today's session which he contributed to having GI issues.  Pt needs UE support with standing balance exercises for safety due to inability to perform SLS on either leg.  Continue POC.   OBJECTIVE IMPAIRMENTS: Abnormal gait, decreased activity tolerance, decreased balance, decreased coordination, decreased knowledge of use of DME, decreased strength, decreased safety awareness, and impaired sensation.   ACTIVITY LIMITATIONS: carrying, lifting, bending, standing, squatting, stairs, transfers, locomotion level, and driving  PARTICIPATION LIMITATIONS: meal prep, cleaning, laundry, driving, shopping, community activity, and yard work  PERSONAL FACTORS: Past/current experiences and 1 comorbidity: h/o cervical tumors with s/p cervical fusion  are also affecting patient's functional outcome.   REHAB POTENTIAL: Good  CLINICAL DECISION MAKING: Evolving/moderate complexity  EVALUATION COMPLEXITY: Moderate  PLAN:  PT FREQUENCY:  1x/week  PT DURATION: 8 weeks (8 additional visits per renewal)  PLANNED INTERVENTIONS: Therapeutic exercises, Therapeutic activity, Neuromuscular re-education, Balance training, Gait training,  Patient/Family education, Self Care, Stair training, Orthotic/Fit training, DME instructions, and Aquatic Therapy  PLAN FOR NEXT SESSION:  check balance HEP; tall kneeling, 1/2 kneeling? continue with standing balance, gait with SBQC (1 lap has been max distance prior to onset of LE weakness/instability), LE strengthening, core stabilization  work on gait training with SBQC SHORT distances (50') only:  any LLE strengthening exercises - closed chain, tall kneeling, etc.:  standing balance exercises/activities; functional strengthening (sit to stands, squats), quadruped, prone, Ramps and curbs w/ cane, core strengthening, low back stretches, balance seated on exercise ball, leg press, standing/half kneel core  Reaching forwards to R lower quarter, especially cross-body reaching, challenges core    Kerry Fort, PT 01/29/2023, 5:02 PM

## 2023-01-29 ENCOUNTER — Encounter: Payer: Self-pay | Admitting: Physical Therapy

## 2023-02-04 ENCOUNTER — Ambulatory Visit: Payer: Managed Care, Other (non HMO) | Admitting: Physical Therapy

## 2023-02-04 DIAGNOSIS — R2681 Unsteadiness on feet: Secondary | ICD-10-CM

## 2023-02-04 DIAGNOSIS — M6281 Muscle weakness (generalized): Secondary | ICD-10-CM

## 2023-02-04 DIAGNOSIS — R2689 Other abnormalities of gait and mobility: Secondary | ICD-10-CM

## 2023-02-04 NOTE — Therapy (Signed)
OUTPATIENT PHYSICAL THERAPY NEURO TREATMENT   Patient Name: Larry Riley MRN: [id number] DOB:[birth year], 43 y.o., male Today's Date: 02/05/2023   PCP: Corwin Levins., MD REFERRING PROVIDER: Juliann Pares, FNP  END OF SESSION:  PT End of Session - 02/05/23 1121     Visit Number 32    Number of Visits 37    Date for PT Re-Evaluation 03/05/23    Authorization Type [employer]    Authorization - Visit Number 3    Authorization - Number of Visits 60    PT Start Time 1532    PT Stop Time 1620    PT Time Calculation (min) 48 min    Equipment Utilized During Treatment Gait belt    Activity Tolerance Patient tolerated treatment well    Behavior During Therapy WFL for tasks assessed/performed                         Past Medical History:  Diagnosis Date   Anxiety    Depression    GERD (gastroesophageal reflux disease) 01/21/2017   MRSA (methicillin resistant Staphylococcus aureus) 2005   leg   Neurofibromatosis    tumor down spine and brain   Past Surgical History:  Procedure Laterality Date   hydrocephalus     shunt 1999-[place]   NECK SURGERY      tumor removal   SPINE SURGERY  10/2013   c2-t2 fusion  , laminectomy c1-6-- Dr Raynald Kemp-- [place]   VASECTOMY  02/2013   Patient Active Problem List   Diagnosis Date Noted   Skin lesion 10/24/2022   Urinary frequency 10/21/2022   Peripheral edema 08/17/2022   Hypotension 08/17/2022   Urinary incontinence 07/04/2022   Neurofibroma 04/23/2022   Neoplasm of unspecified behavior of bone, soft tissue, and skin 02/16/2022   Acute hearing loss, right 08/04/2021   Hyperglycemia 08/04/2021   Abscess of right foot 08/16/2020   Cellulitis of right foot 08/16/2020   Microhematuria 08/03/2020   Gastroesophageal reflux disease 07/07/2019   External otitis of right ear 03/09/2017   Thumb laceration, right, initial encounter 03/09/2017   Weakness of left side of body 11/19/2016   Stress due to illness of family  member 06/10/2016   Syncope 04/23/2016   Onychomycosis 04/23/2016   Impacted ear wax 04/23/2016   Constipation due to opioid therapy 06/13/2014   Chronic pain syndrome 02/21/2014   Allergic reaction 02/20/2014   Neurofibromatosis, type 1 (HCC) 12/14/2013   Status post cervical arthrodesis 11/21/2013   Cervical myelopathy (HCC) 09/21/2013   Encounter for well adult exam with abnormal findings 06/23/2010   Disorder of autonomic nervous system 04/12/2009   Other psoriasis 08/24/2007   Tinea pedis 08/24/2007    ONSET DATE: 04-23-22  REFERRING DIAG:  Diagnosis  D49.2 (ICD-10-CM) - Neoplasm of unspecified behavior of bone, soft tissue, and skin    THERAPY DIAG:  Other abnormalities of gait and mobility  Unsteadiness on feet  Muscle weakness (generalized)  Rationale for Evaluation and Treatment: Rehabilitation  SUBJECTIVE STATEMENT:  "[name]"      Pt reports feeling better today than he did last week; says he has been exercising throughout the day - has spread the workouts out during the day.  Pt does report feeling a "knot" in muscle over his coccyx area - has used percussion gun on it and also has used his wife's lacrosse ball that she uses for applying pressure to trigger points  Pt accompanied by:  spouse, [name]  PERTINENT HISTORY: s/p T11-L1 Laminectomies for intradural tumor resections & L3-L5 laminectomies for intradural tumor resections (hospitalization 04-23-22 - 04-29-22;  Neurofibromatosis 1 diagnosed in 1995  History of foot surgery 08/2020  Patient had metal removed from R foot and infected tissue removed  Neurofibromatosis (CMS/HCC)  Neurofibromatosis, type 1 (von Recklinghausen's disease) (CMS/HCC)  Status post cervical arthrodesis 11/21/2013 - cervical fusion C2-T2, laminectomy C1-  C6  PAIN:   Are you having pain? Yes: NPRS scale: 6/10 Pain location: stiffness in neck and tailbone Pain description: stiff Aggravating factors: sitting Relieving factors: stretching Pain was 7-8/10 prior to surgery (on average day) - pt reports pain is much improved since surgery  PRECAUTIONS: Fall  RED FLAGS: Bowel or bladder incontinence: Yes: Improving - pt states he has mostly bladder incontinence at night; little control over starting/stopping  WEIGHT BEARING RESTRICTIONS: No  FALLS: Has patient fallen in last 6 months? Yes. Number of falls 2  LIVING ENVIRONMENT: Lives with: lives with their spouse Lives in: House/apartment Stairs: Yes: External: 2 steps; can reach both Has following equipment at home: Dan Humphreys - 2 wheeled, Wheelchair (manual), Tour manager, and hemiwalker  PLOF: Independent with basic ADLs, Independent with household mobility without device, Independent with community mobility without device, and Independent with transfers; pt drove prior to surgery on 04-23-22 (has not driven since)  PATIENT GOALS: "walk without use of device, as I was prior to surgery"; work on balance  OBJECTIVE:   DIAGNOSTIC FINDINGS: N/A  COGNITION: Overall cognitive status: Within functional limits for tasks assessed   SENSATION: Impaired in bil. LE's   COORDINATION: Decreased in bil. LE's wi  POSTURE: forward head, decreased thoracic kyphosis, and head is laterally flexed to Lt side  LOWER EXTREMITY ROM:   WFL's except for Lt ankle ROM - pt has worn AFO for > 4 yrs Decreased Rt active dorsiflexion and plantarflexion due to weakness  LOWER EXTREMITY MMT:    MMT Right Eval Left Eval  Hip flexion 5 5  Hip extension    Hip abduction    Hip adduction    Hip internal rotation    Hip external rotation    Knee flexion 4+ (seated position) 4 (seated position)  Knee extension 5 5  Ankle dorsiflexion 3- 0-1  Ankle plantarflexion    Ankle inversion    Ankle eversion     (Blank rows = not tested)  BED MOBILITY:  Modified independent   TRANSFERS: Assistive device utilized: Environmental consultant - 2 wheeled  Sit to stand: Modified independence short distances; SBA community amb. With RW Stand to sit: Modified independence  CURB: TBA  STAIRS:  TBA  GAIT: Gait pattern: step through pattern, decreased ankle dorsiflexion- Right, decreased ankle dorsiflexion- Left, genu recurvatum- Left, and trunk rotated posterior- Left Distance walked: 59' Assistive device utilized: Environmental consultant - 2 wheeled Level of assistance: SBA Comments: AFO worn on LLE - carbon fiber with an anterior guard added (appears to be custom fabricated - pt states he obtained this brace approx. 4 yrs ago)  FUNCTIONAL TESTS:  Timed up and go (TUG): 18.97 secs with RW but pt was not safe - pt was instructed to ambulate at his normal speed but ambulated at fast pace and was unsteady with turns so score is not totally accurate - CGA was provided for safety 10 meter walk test: 15.94 secs with RW = 2.06 ft/sec Berg Balance Scale: 26/56    PATIENT SURVEYS:  N/A for his diagnosis - s/p laminectomies for tumor resections     TODAY'S TREATMENT: 02-04-23   GAIT: Gait pattern: step through pattern, decreased ankle dorsiflexion- Right, decreased ankle dorsiflexion- Left, genu recurvatum- Left, and trunk rotated posterior- Left Distance walked: 230' (2 laps of 115' around track in clinic gym):  clinic distances with RW; Assistive device utilized: SBQC Level of assistance: CGA Comments: AFO worn on LLE - carbon fiber with an anterior guard added (appears to be custom fabricated - pt states he obtained this brace approx. 4 yrs ago)             Cues to make turns slowly to prevent LOB and also cues to attend to objects on Rt side to avoid hitting them during gait as pt ambulates very close to objects at times  Gait velocity = 14.28 secs with RW = 2.30 ft/sec; Rt foot slap noted in stance due to decreased eccentric  dorsiflexor strength  TherEx: Pt performed stretching - child's pose position on mat - approx. 30 sec hold to alleviate tightness in coccyx area (pt reports feeling "knot" in this area)  Sit to stand- 3 reps from standard chair (for STG assessment); performed after 230' gait training so pt was somewhat fatigued;  pt able to perform with moderate difficulty with need for positioning on edge of chair and momentum for transfer  Pt in tall kneeling position - used Airex for cushioning for both knees for increased comfort;  orange band used for abduction 5 reps and then diagonals "X" 5 reps each for core stabilization Used orange resistance band - performed rows 10 reps in tall kneeling for improved core stabilization; performed horizontal shoulder abduction 10 reps; shoulder extension 10 reps  Pt sat on SitFit - feet on floor initially - 5# kettlebell used - lifting straight up; pt had no difficulty with this activity - progressed to use of round black bolster under feet for increased unstability for increased core stability; pt rolled bolster forward/back 5 reps;  trialed rockerboard anterior/posterior and then laterally and then trialed inverted Bosu to simulate this exercise at home for HEP as pt states he does not have a round bolster; pt reported Bosu was more challenging than rockerboard; performed trunk rotations (small range) 5 reps to each side with 5# kettlebell   PATIENT EDUCATION:  Education details: instructed pt and wife to perform squats at sink with chair behind him for safety; informed wife and pt that 4# medicine ball can be used to perform slam ball in standing for more challenge but to perform leaning up against cabinet initially, as pt would not be able to perform unsupported in standing; demonstrated progressing tall kneeling position to 1/2 kneeling on each leg (pt stated knee pads would be needed for knee comfort); also discussed amb. On knees in tall kneeling position Person  educated: Patient and Spouse Education method: Explanation Education comprehension: verbalized understanding  HOME EXERCISE PROGRAM:   Access Code: [email or web address] URL: [email or web address] Date: 11/03/2022 Prepared by: Maebelle Munroe  Exercises - Prone Knee Flexion AROM  - 1 x daily - 7 x weekly -  1 sets - 10 reps - 3 sec hold - Beginner Clam  - 1 x daily - 7 x weekly - 1 sets - 10 reps - Hooklying Isometric Clamshell  - 1 x daily - 7 x weekly - 1 sets - 10 reps - 3 sec hold - SEATED SIDE PLANK - LEFT SIDE  - 1 x daily - 7 x weekly - 3 sets - 10 reps   Access Code: [email or web address] URL: [email or web address] Date: 10/13/2022 Prepared by: Maryruth Eve  Exercises - Sit to Stand  - 1 x daily - 7 x weekly - 2-3 sets - 10 reps - Sit to Stand with Resistance Around Legs  - 1 x daily - 7 x weekly - 2-3 sets - 5 reps - Standing March with Unilateral Counter Support  - 1 x daily - 7 x weekly - 3 sets - 20 reps - Standing 3-Way Leg Reach with Resistance at Ankles and Counter Support  - 1 x daily - 7 x weekly - 2-3 sets - 10 reps  Walking with hemiwalker with gait belt and spouse supervision ~1x day  [employer] [email or web address]  GOALS: Goals reviewed with patient? Yes     SHORT TERM GOALS: Target date: 02-05-23 (4 weeks)  Pt will perform sit to stand transfer without UE support from standard chair 3 reps and will stabilize upon initial standing without bracing with legs. Baseline:  bil. UE support needed from mat table; RUE support only no bracing w/ Les (11/04/22): 12-03-22 - balance/stability varies depending on fatigue; pt able to perform without UE support when legs are not fatigue, otherwise, needs to brace with legs for stability upon initial standing Goal status: Goal met 02-04-23   2.  Pt will amb. 30' with quad cane with SBA on flat, even surface for increased community accessibility. Baseline: pt using RW- modified independent household, SBA community amb.;  115'  x1  Goal status: Goal met 02-04-23  3.  Improve Berg balance test score to >/= 37/56 to demo improved standing balance. Baseline: 26/56; regressed to 22/56 in today's session; score 32/56 on 11-05-22:  score 34/56 on 01-08-23 Goal status: REVISED  4.   Increase gait velocity to >/= 3.0 ft/sec with RW for increased gait efficiency. Baseline: 15.94 secs = 2.06 ft/sec; 15.06 sec = 2.18 ft/sec (11/04/22); 13.26 seconds = 2.47 ft/sec (11/19):  14.44 secs with RW = 2.27 ft/sec - 01-11-23;  14.28 secs with RW = 2.30 ft/sec Goal status: Not met 02-04-23  5.  Improve TUG score to </= 15 secs with RW with pt demonstrating correct hand placement with sit to/from stand transfers and safe speed, I.e. not rushing. Baseline: 18.97 secs with RW but not safe; improved to 20.6 seconds with SBQC (minA); 11.95 seconds with 2WW (CGA but cues for slower pacing moving forward for safety)  15.72 secs with RW with CGA - 01-11-23 Goal status: ONGOING  6.  Independent in updated HEP for balance exercises.   Baseline: balance exercises added 01-11-23 Goal status: Goal met 02-04-23   NEW (REVISED) LONG TERM GOALS:  TARGET DATE 03-05-23 (8 weeks)   Pt will perform sit to stand transfer without UE support from standard chair 5 reps and will stabilize upon initial standing without bracing with legs. Baseline:  bil. UE support needed from mat table; RUE support only no bracing w/ Les (11/04/22): 12-03-22 - balance/stability varies depending on fatigue; pt able to perform without UE support when legs are not fatigue, otherwise, needs to brace with legs  for stability upon initial standing Goal status:  ONGOING -  Partially met 01-08-23 - pt performed from mat table but performance varies based on fatigue  2.   Pt will amb. 350' with RW with supervision on flat, even surface for increased community accessibility. Baseline: pt using RW- modified independent household, SBA community amb.;  115' x1  Goal status: ONGOING - Not met  01-11-23 - due to c/o low back tightening  3.  Improve Berg balance test score to >/= 40/56 to demo improved standing balance. Baseline: 26/56; score 34/56 - 01-08-23 Goal status: NOT MET (ongoing) 01-08-23  4.  Increase gait velocity to >/= 3.2 ft/sec with RW for increased gait efficiency. Baseline: 15.94 secs = 2.06 ft/sec; 15.06 sec = 2.18 ft/sec (11/04/22); 13.26 seconds = 2.47 ft/sec (11/19);  14.44 secs with RW = 2.27 ft/sec  Goal status: Upgraded - Partially met 01-11-23  5.  Improve TUG score to </= 15 secs with RW with pt demonstrating correct hand placement with sit to/from stand transfers and safe speed, I.e. not rushing. Baseline: 18.97 secs with RW but not safe; improved to 20.6 seconds with SBQC (minA); 11.95 seconds with 2WW (CGA but cues for slower pacing moving forward for safety);  15.72 secs with RW   Goal status: Partially met 01-11-23  ASSESSMENT:  CLINICAL IMPRESSION:  PT session focused on STG assessment, gait training with [facility] and core strengthening/stabilization exercises.  Pt has met STG's #1,2 and 6:  STG #4 not met as gait velocity = 2.30 ft/sec with RW (STG set for >/= 3.0 ft/sec);  gait speed is essentially unchanged since previous assessment on 01-11-23 with speed 2.27 ft/sec.  Pt's Rt foot noted to have increased foot slap due to decreased eccentric dorsiflexor strength and control during assessment of gait velocity in today's session.  Pt attributed this occurrence to feeling knotted muscle in coccyx area.  STG's #3 and 5 to be assessed next session.  Continue POC.   OBJECTIVE IMPAIRMENTS: Abnormal gait, decreased activity tolerance, decreased balance, decreased coordination, decreased knowledge of use of DME, decreased strength, decreased safety awareness, and impaired sensation.   ACTIVITY LIMITATIONS: carrying, lifting, bending, standing, squatting, stairs, transfers, locomotion level, and driving  PARTICIPATION LIMITATIONS: meal prep, cleaning, laundry,  driving, shopping, community activity, and yard work  PERSONAL FACTORS: Past/current experiences and 1 comorbidity: h/o cervical tumors with s/p cervical fusion  are also affecting patient's functional outcome.   REHAB POTENTIAL: Good  CLINICAL DECISION MAKING: Evolving/moderate complexity  EVALUATION COMPLEXITY: Moderate  PLAN:  PT FREQUENCY:  1x/week  PT DURATION: 8 weeks (8 additional visits per renewal)  PLANNED INTERVENTIONS: Therapeutic exercises, Therapeutic activity, Neuromuscular re-education, Balance training, Gait training, Patient/Family education, Self Care, Stair training, Orthotic/Fit training, DME instructions, and Aquatic Therapy  PLAN FOR NEXT SESSION:  check STG's #3 and 5; continue with standing balance, gait with SBQC (1 lap has been max distance prior to onset of LE weakness/instability), LE strengthening, core stabilization  work on gait training with SBQC SHORT distances (50') only:  any LLE strengthening exercises - closed chain, tall kneeling, etc.:  standing balance exercises/activities; functional strengthening (sit to stands, squats), quadruped, prone, Ramps and curbs w/ cane, core strengthening, low back stretches, balance seated on exercise ball, leg press, standing/half kneel core  Reaching forwards to R lower quarter, especially cross-body reaching, challenges core    Kerry Fort, PT 02/05/2023, 11:45 AM

## 2023-02-05 ENCOUNTER — Encounter: Payer: Self-pay | Admitting: Physical Therapy

## 2023-02-11 ENCOUNTER — Ambulatory Visit: Payer: Managed Care, Other (non HMO) | Admitting: Physical Therapy

## 2023-02-11 ENCOUNTER — Encounter: Payer: Self-pay | Admitting: Physical Therapy

## 2023-02-11 DIAGNOSIS — R2689 Other abnormalities of gait and mobility: Secondary | ICD-10-CM | POA: Diagnosis not present

## 2023-02-11 DIAGNOSIS — R2681 Unsteadiness on feet: Secondary | ICD-10-CM

## 2023-02-11 DIAGNOSIS — M6281 Muscle weakness (generalized): Secondary | ICD-10-CM

## 2023-02-11 NOTE — Therapy (Signed)
OUTPATIENT PHYSICAL THERAPY NEURO TREATMENT   Patient Name: Larry Riley MRN: [id number] DOB:1980-05-11, 43 y.o., male Today's Date: 02/11/2023   PCP: Corwin Levins., MD REFERRING PROVIDER: Juliann Pares, FNP  END OF SESSION:  PT End of Session - 02/11/23 2021     Visit Number 33    Number of Visits 37    Date for PT Re-Evaluation 03/05/23    Authorization Type [employer]    Authorization - Visit Number 4    Authorization - Number of Visits 60    PT Start Time 1533    PT Stop Time 1618    PT Time Calculation (min) 45 min    Equipment Utilized During Treatment Gait belt    Activity Tolerance Patient tolerated treatment well    Behavior During Therapy WFL for tasks assessed/performed                          Past Medical History:  Diagnosis Date   Anxiety    Depression    GERD (gastroesophageal reflux disease) 01/21/2017   MRSA (methicillin resistant Staphylococcus aureus) 2005   leg   Neurofibromatosis    tumor down spine and brain   Past Surgical History:  Procedure Laterality Date   hydrocephalus     shunt 1999-[place]   NECK SURGERY      tumor removal   SPINE SURGERY  10/2013   c2-t2 fusion  , laminectomy c1-6-- Dr Raynald Kemp-- [place]   VASECTOMY  02/2013   Patient Active Problem List   Diagnosis Date Noted   Skin lesion 10/24/2022   Urinary frequency 10/21/2022   Peripheral edema 08/17/2022   Hypotension 08/17/2022   Urinary incontinence 07/04/2022   Neurofibroma 04/23/2022   Neoplasm of unspecified behavior of bone, soft tissue, and skin 02/16/2022   Acute hearing loss, right 08/04/2021   Hyperglycemia 08/04/2021   Abscess of right foot 08/16/2020   Cellulitis of right foot 08/16/2020   Microhematuria 08/03/2020   Gastroesophageal reflux disease 07/07/2019   External otitis of right ear 03/09/2017   Thumb laceration, right, initial encounter 03/09/2017   Weakness of left side of body 11/19/2016   Stress due to illness of family  member 06/10/2016   Syncope 04/23/2016   Onychomycosis 04/23/2016   Impacted ear wax 04/23/2016   Constipation due to opioid therapy 06/13/2014   Chronic pain syndrome 02/21/2014   Allergic reaction 02/20/2014   Neurofibromatosis, type 1 (HCC) 12/14/2013   Status post cervical arthrodesis 11/21/2013   Cervical myelopathy (HCC) 09/21/2013   Encounter for well adult exam with abnormal findings 06/23/2010   Disorder of autonomic nervous system 04/12/2009   Other psoriasis 08/24/2007   Tinea pedis 08/24/2007    ONSET DATE: 04-23-22  REFERRING DIAG:  Diagnosis  D49.2 (ICD-10-CM) - Neoplasm of unspecified behavior of bone, soft tissue, and skin    THERAPY DIAG:  Other abnormalities of gait and mobility  Unsteadiness on feet  Muscle weakness (generalized)  Rationale for Evaluation and Treatment: Rehabilitation  SUBJECTIVE STATEMENT:  "[name]"      Pt reports feeling better today than he did last week; says he has been exercising throughout the day - has spread the workouts out during the day.  Pt does report feeling a "knot" in muscle over his coccyx area - has used percussion gun on it and also has used his wife's lacrosse ball that she uses for applying pressure to trigger points  Pt accompanied by:  spouse, [name]  PERTINENT HISTORY: s/p T11-L1 Laminectomies for intradural tumor resections & L3-L5 laminectomies for intradural tumor resections (hospitalization 04-23-22 - 04-29-22;  Neurofibromatosis 1 diagnosed in 1995  History of foot surgery 08/2020  Patient had metal removed from R foot and infected tissue removed  Neurofibromatosis (CMS/HCC)  Neurofibromatosis, type 1 (von Recklinghausen's disease) (CMS/HCC)  Status post cervical arthrodesis 11/21/2013 - cervical fusion C2-T2, laminectomy C1-  C6  PAIN:   Are you having pain? Yes: NPRS scale: 6/10 Pain location: stiffness in neck and tailbone Pain description: stiff Aggravating factors: sitting Relieving factors: stretching Pain was 7-8/10 prior to surgery (on average day) - pt reports pain is much improved since surgery  PRECAUTIONS: Fall  RED FLAGS: Bowel or bladder incontinence: Yes: Improving - pt states he has mostly bladder incontinence at night; little control over starting/stopping  WEIGHT BEARING RESTRICTIONS: No  FALLS: Has patient fallen in last 6 months? Yes. Number of falls 2  LIVING ENVIRONMENT: Lives with: lives with their spouse Lives in: House/apartment Stairs: Yes: External: 2 steps; can reach both Has following equipment at home: Dan Humphreys - 2 wheeled, Wheelchair (manual), Tour manager, and hemiwalker  PLOF: Independent with basic ADLs, Independent with household mobility without device, Independent with community mobility without device, and Independent with transfers; pt drove prior to surgery on 04-23-22 (has not driven since)  PATIENT GOALS: "walk without use of device, as I was prior to surgery"; work on balance  OBJECTIVE:   DIAGNOSTIC FINDINGS: N/A  COGNITION: Overall cognitive status: Within functional limits for tasks assessed   SENSATION: Impaired in bil. LE's   COORDINATION: Decreased in bil. LE's wi  POSTURE: forward head, decreased thoracic kyphosis, and head is laterally flexed to Lt side  LOWER EXTREMITY ROM:   WFL's except for Lt ankle ROM - pt has worn AFO for > 4 yrs Decreased Rt active dorsiflexion and plantarflexion due to weakness  LOWER EXTREMITY MMT:    MMT Right Eval Left Eval  Hip flexion 5 5  Hip extension    Hip abduction    Hip adduction    Hip internal rotation    Hip external rotation    Knee flexion 4+ (seated position) 4 (seated position)  Knee extension 5 5  Ankle dorsiflexion 3- 0-1  Ankle plantarflexion    Ankle inversion    Ankle eversion     (Blank rows = not tested)  BED MOBILITY:  Modified independent   TRANSFERS: Assistive device utilized: Environmental consultant - 2 wheeled  Sit to stand: Modified independence short distances; SBA community amb. With RW Stand to sit: Modified independence  CURB: TBA  STAIRS:  TBA  GAIT: Gait pattern: step through pattern, decreased ankle dorsiflexion- Right, decreased ankle dorsiflexion- Left, genu recurvatum- Left, and trunk rotated posterior- Left Distance walked: 31' Assistive device utilized: Environmental consultant - 2 wheeled Level of assistance: SBA Comments: AFO worn on LLE - carbon fiber with an anterior guard added (appears to be custom fabricated - pt states he obtained this brace approx. 4 yrs ago)  FUNCTIONAL TESTS:  Timed up and go (TUG): 18.97 secs with RW but pt was not safe - pt was instructed to ambulate at his normal speed but ambulated at fast pace and was unsteady with turns so score is not totally accurate - CGA was provided for safety 10 meter walk test: 15.94 secs with RW = 2.06 ft/sec Berg Balance Scale: 26/56    PATIENT SURVEYS:  N/A for his diagnosis - s/p laminectomies for tumor resections     TODAY'S TREATMENT: 02-11-23  NEURO RE-ED:  Sharlene Motts Balance test    [facility] PT Assessment - 02/11/23 0001       Berg Balance Test   Sit to Stand Able to stand without using hands and stabilize independently    Standing Unsupported Able to stand safely 2 minutes    Sitting with Back Unsupported but Feet Supported on Floor or Stool Able to sit safely and securely 2 minutes    Stand to Sit Sits safely with minimal use of hands    Transfers Able to transfer safely, minor use of hands    Standing Unsupported with Eyes Closed Able to stand 10 seconds with supervision    Standing Unsupported with Feet Together Able to place feet together independently and stand for 1 minute with supervision    From Standing, Reach Forward with Outstretched Arm Can reach forward >12 cm safely (5")    From Standing  Position, Pick up Object from Floor Able to pick up shoe, needs supervision    From Standing Position, Turn to Look Behind Over each Shoulder Turn sideways only but maintains balance    Turn 360 Degrees Needs close supervision or verbal cueing    Standing Unsupported, Alternately Place Feet on Step/Stool Able to complete >2 steps/needs minimal assist    Standing Unsupported, One Foot in Front Able to take small step independently and hold 30 seconds    Standing on One Leg Tries to lift leg/unable to hold 3 seconds but remains standing independently    Total Score 39             TUG score: 11.88 secs with RW with CGA - cues to ambulate at "normal speed" but pt ambulated at faster than normal speed - CGA needed for safety  Instructed in updated HEP:  NEW EXERCISES:  pt performed following exercises after Berg balance test completed:  AT COUNTER:  1) LATERAL WEIGHT SHIFTS WITH TARGETS               2) WEIGHT SHIFT ROTATION                             3) SINGLE LIMB STANCE - 10 SEC HOLD IS THE GOAL  TAP UPS ONTO STEP - ALTERNATING  SQUATS - 10  REPS - RW IN FRONT, CHAIR BEHIND  GAIT: Gait pattern: step through pattern,30 decreased ankle dorsiflexion- Right, decreased ankle dorsiflexion- Left, genu recurvatum- Left, and trunk rotated posterior- Left Distance walked: 10' x 6 reps inside // bars - Assistive device utilized:  // Bars - no UE support used but performed inside // bars for safety, for UE support prn Level of assistance: CGA to SBA Comments: AFO worn on LLE - carbon fiber with an anterior guard added (appears to be custom fabricated - pt states he obtained this brace approx. 4 yrs ago)             Cues to make turns slowly to prevent LOB and  also cues to attend to objects on Rt side to avoid hitting them during gait as pt ambulates very close to objects at times  Gait velocity = 14.28 secs with RW = 2.30 ft/sec; Rt foot slap noted in stance due to decreased eccentric dorsiflexor  strength    PATIENT EDUCATION:  Education details:  see above for updated HEP - 02-11-23 Person educated: Patient and Spouse Education method: Explanation Education comprehension: verbalized understanding  HOME EXERCISE PROGRAM:   Access Code: [email or web address] URL: [email or web address] Date: 11/03/2022 Prepared by: Maebelle Munroe  Exercises - Prone Knee Flexion AROM  - 1 x daily - 7 x weekly - 1 sets - 10 reps - 3 sec hold - Beginner Clam  - 1 x daily - 7 x weekly - 1 sets - 10 reps - Hooklying Isometric Clamshell  - 1 x daily - 7 x weekly - 1 sets - 10 reps - 3 sec hold - SEATED SIDE PLANK - LEFT SIDE  - 1 x daily - 7 x weekly - 3 sets - 10 reps   Access Code: [email or web address] URL: [email or web address] Date: 10/13/2022 Prepared by: Maryruth Eve  Exercises - Sit to Stand  - 1 x daily - 7 x weekly - 2-3 sets - 10 reps - Sit to Stand with Resistance Around Legs  - 1 x daily - 7 x weekly - 2-3 sets - 5 reps - Standing March with Unilateral Counter Support  - 1 x daily - 7 x weekly - 3 sets - 20 reps - Standing 3-Way Leg Reach with Resistance at Ankles and Counter Support  - 1 x daily - 7 x weekly - 2-3 sets - 10 reps  Walking with hemiwalker with gait belt and spouse supervision ~1x day  [employer] [email or web address]  GOALS: Goals reviewed with patient? Yes     SHORT TERM GOALS: Target date: 02-05-23 (4 weeks)  Pt will perform sit to stand transfer without UE support from standard chair 3 reps and will stabilize upon initial standing without bracing with legs. Baseline:  bil. UE support needed from mat table; RUE support only no bracing w/ Les (11/04/22): 12-03-22 - balance/stability varies depending on fatigue; pt able to perform without UE support when legs are not fatigue, otherwise, needs to brace with legs for stability upon initial standing Goal status: Goal met 02-04-23   2.  Pt will amb. 30' with quad cane with SBA on flat, even surface for increased  community accessibility. Baseline: pt using RW- modified independent household, SBA community amb.;  115' x1  Goal status: Goal met 02-04-23  3.  Improve Berg balance test score to >/= 37/56 to demo improved standing balance. Baseline: 26/56; regressed to 22/56 in today's session; score 32/56 on 11-05-22:  score 34/56 on 01-08-23;  score 39/56 on 02-11-23 Goal status: Goal met 02-11-23  4.   Increase gait velocity to >/= 3.0 ft/sec with RW for increased gait efficiency. Baseline: 15.94 secs = 2.06 ft/sec; 15.06 sec = 2.18 ft/sec (11/04/22); 13.26 seconds = 2.47 ft/sec (11/19):  14.44 secs with RW = 2.27 ft/sec - 01-11-23;  14.28 secs with RW = 2.30 ft/sec Goal status: Not met 02-04-23  5.  Improve TUG score to </= 15 secs with RW with pt demonstrating correct hand placement with sit to/from stand transfers and safe speed, I.e. not rushing. Baseline: 18.97 secs with RW but not safe; improved to 20.6 seconds with SBQC (minA); 11.95 seconds with 2WW (CGA but cues for slower pacing moving forward for  safety)  15.72 secs with RW with CGA - 01-11-23; 11.88 secs with RW - 02-11-23 but not at safe speed Goal status: Partially met - ONGOING - 02-11-23  6.  Independent in updated HEP for balance exercises.   Baseline: balance exercises added 01-11-23 Goal status: Goal met 02-04-23   NEW (REVISED) LONG TERM GOALS:  TARGET DATE 03-05-23 (8 weeks)   Pt will perform sit to stand transfer without UE support from standard chair 5 reps and will stabilize upon initial standing without bracing with legs. Baseline:  bil. UE support needed from mat table; RUE support only no bracing w/ Les (11/04/22): 12-03-22 - balance/stability varies depending on fatigue; pt able to perform without UE support when legs are not fatigue, otherwise, needs to brace with legs for stability upon initial standing Goal status:  ONGOING -  Partially met 01-08-23 - pt performed from mat table but performance varies based on fatigue  2.   Pt  will amb. 350' with RW with supervision on flat, even surface for increased community accessibility. Baseline: pt using RW- modified independent household, SBA community amb.;  115' x1  Goal status: ONGOING - Not met 01-11-23 - due to c/o low back tightening  3.  Improve Berg balance test score to >/= 40/56 to demo improved standing balance. Baseline: 26/56; score 34/56 - 01-08-23 Goal status: NOT MET (ongoing) 01-08-23  4.  Increase gait velocity to >/= 3.2 ft/sec with RW for increased gait efficiency. Baseline: 15.94 secs = 2.06 ft/sec; 15.06 sec = 2.18 ft/sec (11/04/22); 13.26 seconds = 2.47 ft/sec (11/19);  14.44 secs with RW = 2.27 ft/sec  Goal status: Upgraded - Partially met 01-11-23  5.  Improve TUG score to </= 15 secs with RW with pt demonstrating correct hand placement with sit to/from stand transfers and safe speed, I.e. not rushing. Baseline: 18.97 secs with RW but not safe; improved to 20.6 seconds with SBQC (minA); 11.95 seconds with 2WW (CGA but cues for slower pacing moving forward for safety);  15.72 secs with RW   Goal status: Partially met 01-11-23  ASSESSMENT:  CLINICAL IMPRESSION:  PT session focused on completion of STG assessment with assessment of Berg balance test and TUG.  Pt's Berg score has increased from 34/56 to 39/56, meeting STG #3.  STG #5 partially met as pt's TUG score = 11.88 secs with RW, but pt's speed was faster than his normal speed with CGA needed for safety and with uncontrolled sitting at end of test.  Pt was instructed to ambulate at his normal speed but ambulated very quickly despite instructions.  Remainder of session focused on updating exercises to improve standing balance with these exercises added to HEP (see above).  Continue POC.   OBJECTIVE IMPAIRMENTS: Abnormal gait, decreased activity tolerance, decreased balance, decreased coordination, decreased knowledge of use of DME, decreased strength, decreased safety awareness, and impaired  sensation.   ACTIVITY LIMITATIONS: carrying, lifting, bending, standing, squatting, stairs, transfers, locomotion level, and driving  PARTICIPATION LIMITATIONS: meal prep, cleaning, laundry, driving, shopping, community activity, and yard work  PERSONAL FACTORS: Past/current experiences and 1 comorbidity: h/o cervical tumors with s/p cervical fusion  are also affecting patient's functional outcome.   REHAB POTENTIAL: Good  CLINICAL DECISION MAKING: Evolving/moderate complexity  EVALUATION COMPLEXITY: Moderate  PLAN:  PT FREQUENCY:  1x/week  PT DURATION: 8 weeks (8 additional visits per renewal)  PLANNED INTERVENTIONS: Therapeutic exercises, Therapeutic activity, Neuromuscular re-education, Balance training, Gait training, Patient/Family education, Self Care, Stair training, Orthotic/Fit training, DME instructions,  and Aquatic Therapy  PLAN FOR NEXT SESSION:   continue with standing balance, gait with SBQC (1 lap has been max distance prior to onset of LE weakness/instability), LE strengthening, core stabilization  work on gait training with SBQC SHORT distances (50') only:  any LLE strengthening exercises - closed chain, tall kneeling, etc.:  standing balance exercises/activities; functional strengthening (sit to stands, squats), quadruped, prone, Ramps and curbs w/ cane, core strengthening, low back stretches, balance seated on exercise ball, leg press, standing/half kneel core  Reaching forwards to R lower quarter, especially cross-body reaching, challenges core    Kerry Fort, PT 02/11/2023, 8:23 PM

## 2023-02-16 ENCOUNTER — Encounter: Payer: Self-pay | Admitting: Physical Therapy

## 2023-02-16 ENCOUNTER — Ambulatory Visit: Payer: Managed Care, Other (non HMO) | Attending: Nurse Practitioner | Admitting: Physical Therapy

## 2023-02-16 DIAGNOSIS — R2681 Unsteadiness on feet: Secondary | ICD-10-CM | POA: Diagnosis present

## 2023-02-16 DIAGNOSIS — M6281 Muscle weakness (generalized): Secondary | ICD-10-CM | POA: Insufficient documentation

## 2023-02-16 DIAGNOSIS — R2689 Other abnormalities of gait and mobility: Secondary | ICD-10-CM | POA: Insufficient documentation

## 2023-02-16 NOTE — Therapy (Signed)
 OUTPATIENT PHYSICAL THERAPY NEURO TREATMENT   Patient Name: Larry Riley MRN: [id number] DOB:February 18, 1980, 43 y.o., male Today's Date: 02/17/2023   PCP: Larry Riley ORN., MD REFERRING PROVIDER: Elna Elvie Browning, FNP  END OF SESSION:  PT End of Session - 02/16/23 1919     Visit Number 34    Number of Visits 37    Date for PT Re-Evaluation 03/05/23    Authorization Type [employer]    Authorization - Visit Number 5    Authorization - Number of Visits 60    PT Start Time 1532    PT Stop Time 1618    PT Time Calculation (min) 46 min    Equipment Utilized During Treatment Gait belt    Activity Tolerance Patient tolerated treatment well    Behavior During Therapy WFL for tasks assessed/performed                           Past Medical History:  Diagnosis Date   Anxiety    Depression    GERD (gastroesophageal reflux disease) 01/21/2017   MRSA (methicillin resistant Staphylococcus aureus) 2005   leg   Neurofibromatosis    tumor down spine and brain   Past Surgical History:  Procedure Laterality Date   hydrocephalus     shunt 1999-[place]    NECK SURGERY      tumor removal   SPINE SURGERY  10/2013   c2-t2 fusion  , laminectomy c1-6-- Dr Larry Riley-- [place]   VASECTOMY  02/2013   Patient Active Problem List   Diagnosis Date Noted   Skin lesion 10/24/2022   Urinary frequency 10/21/2022   Peripheral edema 08/17/2022   Hypotension 08/17/2022   Urinary incontinence 07/04/2022   Neurofibroma 04/23/2022   Neoplasm of unspecified behavior of bone, soft tissue, and skin 02/16/2022   Acute hearing loss, right 08/04/2021   Hyperglycemia 08/04/2021   Abscess of right foot 08/16/2020   Cellulitis of right foot 08/16/2020   Microhematuria 08/03/2020   Gastroesophageal reflux disease 07/07/2019   External otitis of right ear 03/09/2017   Thumb laceration, right, initial encounter 03/09/2017   Weakness of left side of body 11/19/2016   Stress due to illness of family  member 06/10/2016   Syncope 04/23/2016   Onychomycosis 04/23/2016   Impacted ear wax 04/23/2016   Constipation due to opioid therapy 06/13/2014   Chronic pain syndrome 02/21/2014   Allergic reaction 02/20/2014   Neurofibromatosis, type 1 (HCC) 12/14/2013   Status post cervical arthrodesis 11/21/2013   Cervical myelopathy (HCC) 09/21/2013   Encounter for well adult exam with abnormal findings 06/23/2010   Disorder of autonomic nervous system 04/12/2009   Other psoriasis 08/24/2007   Tinea pedis 08/24/2007    ONSET DATE: 04-23-22  REFERRING DIAG:  Diagnosis  D49.2 (ICD-10-CM) - Neoplasm of unspecified behavior of bone, soft tissue, and skin    THERAPY DIAG:  Other abnormalities of gait and mobility  Unsteadiness on feet  Muscle weakness (generalized)  Rationale for Evaluation and Treatment: Rehabilitation  SUBJECTIVE STATEMENT:  Larry Riley      Pt reports his back is really tight today - slept in recliner last night due to having acid reflux; has had a busy day today with dental appt this morning that lasted an hour and a half due to having filling done; is not feeling quite as well as he normally does  Pt accompanied by:  spouse, Larry Riley  PERTINENT HISTORY: s/p T11-L1 Laminectomies for intradural tumor resections & L3-L5 laminectomies for intradural tumor resections (hospitalization 04-23-22 - 04-29-22;  Neurofibromatosis 1 diagnosed in 1995  History of foot surgery 08/2020  Patient had metal removed from R foot and infected tissue removed  Neurofibromatosis (CMS/HCC)  Neurofibromatosis, type 1 (von Recklinghausen's disease) (CMS/HCC)  Status post cervical arthrodesis 11/21/2013 - cervical fusion C2-T2, laminectomy C1- C6  PAIN:   Are you having pain? Yes: NPRS scale: 6/10 Pain location: stiffness  in neck and tailbone Pain description: stiff Aggravating factors: sitting Relieving factors: stretching Pain was 7-8/10 prior to surgery (on average day) - pt reports pain is much improved since surgery  PRECAUTIONS: Fall  RED FLAGS: Bowel or bladder incontinence: Yes: Improving - pt states he has mostly bladder incontinence at night; little control over starting/stopping  WEIGHT BEARING RESTRICTIONS: No  FALLS: Has patient fallen in last 6 months? Yes. Number of falls 2  LIVING ENVIRONMENT: Lives with: lives with their spouse Lives in: House/apartment Stairs: Yes: External: 2 steps; can reach both Has following equipment at home: Vannie - 2 wheeled, Wheelchair (manual), Tour manager, and hemiwalker  PLOF: Independent with basic ADLs, Independent with household mobility without device, Independent with community mobility without device, and Independent with transfers; pt drove prior to surgery on 04-23-22 (has not driven since)  PATIENT GOALS: walk without use of device, as I was prior to surgery; work on balance  OBJECTIVE:   DIAGNOSTIC FINDINGS: N/A  COGNITION: Overall cognitive status: Within functional limits for tasks assessed   SENSATION: Impaired in bil. LE's   COORDINATION: Decreased in bil. LE's wi  POSTURE: forward head, decreased thoracic kyphosis, and head is laterally flexed to Lt side  LOWER EXTREMITY ROM:   WFL's except for Lt ankle ROM - pt has worn AFO for > 4 yrs Decreased Rt active dorsiflexion and plantarflexion due to weakness  LOWER EXTREMITY MMT:    MMT Right Eval Left Eval  Hip flexion 5 5  Hip extension    Hip abduction    Hip adduction    Hip internal rotation    Hip external rotation    Knee flexion 4+ (seated position) 4 (seated position)  Knee extension 5 5  Ankle dorsiflexion 3- 0-1  Ankle plantarflexion    Ankle inversion    Ankle eversion    (Blank rows = not tested)  BED MOBILITY:  Modified independent    TRANSFERS: Assistive device utilized: Environmental Consultant - 2 wheeled  Sit to stand: Modified independence short distances; SBA community amb. With RW Stand to sit: Modified independence  CURB: TBA  STAIRS:  TBA  GAIT: Gait pattern: step through pattern, decreased ankle dorsiflexion- Right, decreased ankle dorsiflexion- Left, genu recurvatum- Left, and trunk rotated posterior- Left Distance walked: 23' Assistive device utilized: Environmental Consultant - 2 wheeled Level of assistance: SBA Comments: AFO worn on LLE - carbon fiber with an anterior guard added (appears to be custom fabricated - pt states he obtained this brace approx. 4 yrs ago)  FUNCTIONAL TESTS:  Timed up and go (TUG): 18.97 secs with RW but pt was  not safe - pt was instructed to ambulate at his normal speed but ambulated at fast pace and was unsteady with turns so score is not totally accurate - CGA was provided for safety 10 meter walk test: 15.94 secs with RW = 2.06 ft/sec Berg Balance Scale: 26/56    PATIENT SURVEYS:  N/A for his diagnosis - s/p laminectomies for tumor resections     TODAY'S TREATMENT:  02-16-23  TherEx:  Seated low back stretches at start of session -used green physioball - rolled ball forward and held for approx. 10 secs, then rolled ball to Rt side for Lt lateral trunk stretch - held approx. 15 secs and then rolled ball out & to Lt side for Rt lateral trunk stretch - approx. 15 sec hold  SciFit at end of session - level 3.5 with LE's only for strengthening   Small range squats standing on floor with bil. UE support on // bars 5 reps due to fatigue  GAIT: Gait pattern: step through pattern,30 decreased ankle dorsiflexion- Right, decreased ankle dorsiflexion- Left, genu recurvatum- Left, and trunk rotated posterior- Left Distance walked:  230' at beginning of session; clinic distances after this gait training with RW Assistive device utilized:  SBQC, RW Level of assistance: CGA; Supervision with use of  RW Comments: AFO worn on LLE - carbon fiber with an anterior guard added (appears to be custom fabricated - pt states he obtained this brace approx. 4 yrs ago)             Cues to make turns slowly to prevent LOB and also cues to attend to objects on Rt side to avoid hitting them during gait as pt ambulates very close to objects at times  Gait velocity = 14.28 secs with RW = 2.30 ft/sec; Rt foot slap noted in stance due to decreased eccentric dorsiflexor strength  Neuro Re-ed: Performed standing balance exercises inside // bars to have UE support prn - - pt stood without holding bars - performed horizontal head turns 5 reps and then vertical head turns 5 reps with CGA  Performed weight shift with turning inside bars with UE support - 5 reps to Rt side and 5 reps to Lt side with weight shift   TherAct: Pt performed tossing and catching ball (small ball) to himself - straight up 5 reps with CGA; then progressed to tossing and catching ball on Rt side/Lt side for increased balance with turning and trunk rotation - 5 reps each side with min to mod assist  Pt stood on inverted Bosu inside // bars with bil. UE support on bars - performed lateral weight shifts 10 reps; anterior/posterior weight shifts 10 reps; stood statically on Bosu with minimal UE support on // bars for improved core stability  PATIENT EDUCATION:  Education details:  see above for updated HEP - 02-11-23 Person educated: Patient and Spouse Education method: Explanation Education comprehension: verbalized understanding  HOME EXERCISE PROGRAM:   Access Code: [email or web address] URL: [email or web address] Date: 11/03/2022 Prepared by: Rock Kussmaul  Exercises - Prone Knee Flexion AROM  - 1 x daily - 7 x weekly - 1 sets - 10 reps - 3 sec hold - Beginner Clam  - 1 x daily - 7 x weekly - 1 sets - 10 reps - Hooklying Isometric Clamshell  - 1 x daily - 7 x weekly - 1 sets - 10 reps - 3 sec hold - SEATED SIDE PLANK - LEFT SIDE  -  1 x daily - 7 x weekly -  3 sets - 10 reps   Access Code: [email or web address] URL: [email or web address] Date: 10/13/2022 Prepared by: Lauraine Grumbling  Exercises - Sit to Stand  - 1 x daily - 7 x weekly - 2-3 sets - 10 reps - Sit to Stand with Resistance Around Legs  - 1 x daily - 7 x weekly - 2-3 sets - 5 reps - Standing March with Unilateral Counter Support  - 1 x daily - 7 x weekly - 3 sets - 20 reps - Standing 3-Way Leg Reach with Resistance at Ankles and Counter Support  - 1 x daily - 7 x weekly - 2-3 sets - 10 reps  Walking with hemiwalker with gait belt and spouse supervision ~1x day  Medbridge [email or web address]  GOALS: Goals reviewed with patient? Yes     SHORT TERM GOALS: Target date: 02-05-23 (4 weeks)  Pt will perform sit to stand transfer without UE support from standard chair 3 reps and will stabilize upon initial standing without bracing with legs. Baseline:  bil. UE support needed from mat table; RUE support only no bracing w/ Les (11/04/22): 12-03-22 - balance/stability varies depending on fatigue; pt able to perform without UE support when legs are not fatigue, otherwise, needs to brace with legs for stability upon initial standing Goal status: Goal met 02-04-23   2.  Pt will amb. 30' with quad cane with SBA on flat, even surface for increased community accessibility. Baseline: pt using RW- modified independent household, SBA community amb.;  115' x1  Goal status: Goal met 02-04-23  3.  Improve Berg balance test score to >/= 37/56 to demo improved standing balance. Baseline: 26/56; regressed to 22/56 in today's session; score 32/56 on 11-05-22:  score 34/56 on 01-08-23;  score 39/56 on 02-11-23 Goal status: Goal met 02-11-23  4.   Increase gait velocity to >/= 3.0 ft/sec with RW for increased gait efficiency. Baseline: 15.94 secs = 2.06 ft/sec; 15.06 sec = 2.18 ft/sec (11/04/22); 13.26 seconds = 2.47 ft/sec (11/19):  14.44 secs with RW = 2.27 ft/sec - 01-11-23;  14.28  secs with RW = 2.30 ft/sec Goal status: Not met 02-04-23  5.  Improve TUG score to </= 15 secs with RW with pt demonstrating correct hand placement with sit to/from stand transfers and safe speed, I.e. not rushing. Baseline: 18.97 secs with RW but not safe; improved to 20.6 seconds with SBQC (minA); 11.95 seconds with 2WW (CGA but cues for slower pacing moving forward for safety)  15.72 secs with RW with CGA - 01-11-23; 11.88 secs with RW - 02-11-23 but not at safe speed Goal status: Partially met - ONGOING - 02-11-23  6.  Independent in updated HEP for balance exercises.   Baseline: balance exercises added 01-11-23 Goal status: Goal met 02-04-23   NEW (REVISED) LONG TERM GOALS:  TARGET DATE 03-05-23 (8 weeks)   Pt will perform sit to stand transfer without UE support from standard chair 5 reps and will stabilize upon initial standing without bracing with legs. Baseline:  bil. UE support needed from mat table; RUE support only no bracing w/ Les (11/04/22): 12-03-22 - balance/stability varies depending on fatigue; pt able to perform without UE support when legs are not fatigue, otherwise, needs to brace with legs for stability upon initial standing Goal status:  ONGOING -  Partially met 01-08-23 - pt performed from mat table but performance varies based on fatigue  2.   Pt will amb. 350' with RW with supervision on flat, even surface for increased  community accessibility. Baseline: pt using RW- modified independent household, SBA community amb.;  115' x1  Goal status: ONGOING - Not met 01-11-23 - due to c/o low back tightening  3.  Improve Berg balance test score to >/= 40/56 to demo improved standing balance. Baseline: 26/56; score 34/56 - 01-08-23 Goal status: NOT MET (ongoing) 01-08-23  4.  Increase gait velocity to >/= 3.2 ft/sec with RW for increased gait efficiency. Baseline: 15.94 secs = 2.06 ft/sec; 15.06 sec = 2.18 ft/sec (11/04/22); 13.26 seconds = 2.47 ft/sec (11/19);  14.44 secs with  RW = 2.27 ft/sec  Goal status: Upgraded - Partially met 01-11-23  5.  Improve TUG score to </= 15 secs with RW with pt demonstrating correct hand placement with sit to/from stand transfers and safe speed, I.e. not rushing. Baseline: 18.97 secs with RW but not safe; improved to 20.6 seconds with SBQC (minA); 11.95 seconds with 2WW (CGA but cues for slower pacing moving forward for safety);  15.72 secs with RW   Goal status: Partially met 01-11-23  ASSESSMENT:  CLINICAL IMPRESSION:  PT session focused gait training with [facility] and on core strengthening with pt standing on inverted Bosu inside // bars to have UE support prn for balance recovery.  Pt reported increased tightness in low back musculature which impacted activity tolerance and also endurance.  Pt performed SciFit for seated strengthening exercise due to decreased muscle endurance and fatigue in bil. LE's. Continue POC.   OBJECTIVE IMPAIRMENTS: Abnormal gait, decreased activity tolerance, decreased balance, decreased coordination, decreased knowledge of use of DME, decreased strength, decreased safety awareness, and impaired sensation.   ACTIVITY LIMITATIONS: carrying, lifting, bending, standing, squatting, stairs, transfers, locomotion level, and driving  PARTICIPATION LIMITATIONS: meal prep, cleaning, laundry, driving, shopping, community activity, and yard work  PERSONAL FACTORS: Past/current experiences and 1 comorbidity: h/o cervical tumors with s/p cervical fusion  are also affecting patient's functional outcome.   REHAB POTENTIAL: Good  CLINICAL DECISION MAKING: Evolving/moderate complexity  EVALUATION COMPLEXITY: Moderate  PLAN:  PT FREQUENCY:  1x/week  PT DURATION: 8 weeks (8 additional visits per renewal)  PLANNED INTERVENTIONS: Therapeutic exercises, Therapeutic activity, Neuromuscular re-education, Balance training, Gait training, Patient/Family education, Self Care, Stair training, Orthotic/Fit training, DME  instructions, and Aquatic Therapy  PLAN FOR NEXT SESSION:   Modified plank?  Lifting each leg up/over object in seated position - on Sitfit? And try in long sitting  continue with standing balance, gait with SBQC (1 lap has been max distance prior to onset of LE weakness/instability), LE strengthening, core stabilization  work on gait training with SBQC SHORT distances (50') only:  any LLE strengthening exercises - closed chain, tall kneeling, etc.:  standing balance exercises/activities; functional strengthening (sit to stands, squats), quadruped, prone, Ramps and curbs w/ cane, core strengthening, low back stretches, balance seated on exercise ball, leg press, standing/half kneel core  Reaching forwards to R lower quarter, especially cross-body reaching, challenges core    Elvie Kussmaul, PT 02/17/2023, 10:48 AM

## 2023-02-18 ENCOUNTER — Ambulatory Visit: Payer: Managed Care, Other (non HMO) | Admitting: Physical Therapy

## 2023-02-25 ENCOUNTER — Ambulatory Visit: Payer: Managed Care, Other (non HMO) | Admitting: Physical Therapy

## 2023-03-03 ENCOUNTER — Ambulatory Visit: Payer: 59 | Admitting: Behavioral Health

## 2023-03-03 ENCOUNTER — Encounter: Payer: Self-pay | Admitting: Behavioral Health

## 2023-03-03 DIAGNOSIS — F411 Generalized anxiety disorder: Secondary | ICD-10-CM

## 2023-03-03 DIAGNOSIS — F331 Major depressive disorder, recurrent, moderate: Secondary | ICD-10-CM

## 2023-03-03 NOTE — Progress Notes (Addendum)
 [facility] Counselor/Therapist Progress Note  Patient ID: Larry Riley, MRN: [id number],    Date: 03/03/2023  Time Spent: 53 minutes, 1001 to 1054 AM this session was held via video teletherapy. The patient consented to the video teletherapy and was located in his home office during this session.  He is aware it is the responsibility of the patient to secure confidentiality on his end of the session. The provider was in a private home office for the duration of this session.      Treatment Type: Individual Therapy  Reported Symptoms: Anxiety/stress  Mental Status Exam: Appearance:  Casual     Behavior: Appropriate  Motor: Normal  Speech/Language:  Normal Rate  Affect: Appropriate  Mood: normal  Thought process: normal  Thought content:   WNL  Sensory/Perceptual disturbances:   WNL  Orientation: oriented to person, place, time/date, situation, day of week, and month of year  Attention: Good  Concentration: Good  Memory: WNL  Fund of knowledge:  Good  Insight:   Good  Judgment:  Good  Impulse Control: Good   Risk Assessment: Danger to Self:  No Self-injurious Behavior: No Danger to Others: No Duty to Warn:no Physical Aggression / Violence:No  Access to Firearms a concern: No  Gang Involvement:No   Subjective: A lot has gone on with the patient since our last session.  His refrigerator died of which created some financial stress that although they could handle was difficult.  He has been busy enough that he has not been able to talk to his many people about his father's health but what conversations he has had shows what he sees her cognitive decline.  He has had his father up with a doctor who will meet with his father every couple of weeks and hopes that he can get some more feedback.  He is still not well enough to go back to his apartment and if he is not back in his apartment by the end of this month he could lose everything is set.  There are things that  the patient would like to have and he is trying to find a way to get someone to go in there and get those this into him but there are bed bugs all over the apartment and people are hesitant to do that.  He can get a hold of the office manager to try to make some kind of arrangements and his siblings are not responding to his request for help and they live closer to the area than the patient does.  He has boss who has been very supportive of the work that he has done in his growth and development has turned into her notice and has 2 weeks there.  He has learned a lot from her and knows he will miss her greatly but is hoping that she will document all the work that he has done in the growth that he has made so that whoever takes over will see that.  He is not necessarily happy with the company but is going to wait it out and see what happens because they are very aware of and reactive to people with disabilities and are very accommodating.  That is important to him.  The class that he did went very well and he is thankful for the opportunity to be helpful in that area.  He also is making significant strides in terms of getting physically stronger his stamina.  We talked about the mental and emotional and physical  fortitude it takes to do the recovery that he is doing.  He is thankful he has a wife who is very supportive.  His short-term goal for him is to be able to drive short distances on his own so he does not have to rely out on his wife for that.  He will have that conversation with his doctors and physical therapist and his next visits.  Otherwise his coping skills have been very beneficial to him in a very stressful time.  He says a year ago 6 months ago he would have gotten extremely angry and not handled it well but he sees the growth that has taken place.   He does contract for safety having no thoughts of hurting himself or anyone else. Interventions: Cognitive Behavioral Therapy  Diagnosis:  Generalized anxiety disorder  Plan: I will meet with the patient every 2 to 3 weeks via care agility.  Treatment plan: We will use cognitive behavioral therapy as well as dialectical behavior therapy to help the patient reduce anxiety/stress primarily related to medical issues.  The goal is to reduce anxiety by 50% with a target date of February 12, 2023.  Goals for reducing anxiety include helping him manage thoughts and worrisome thinking contributing to feelings of anxiety as well as recognition of those thoughts.  We will look to resolve any core conflicts outside of the medical and health issues that are contributing to anxiety including family situations and relationships.  In addition to that we will identify causes for anxiety and explore ways to lower it as well as improve his ability to manage anxiety symptoms and better handle stress.  Interventions will be to provide education about anxiety and stress to help him identify causes and symptoms.  Introduce coping skills and problem solution skills to manage anxiety including DBT distress tolerance and mindfulness skills.  We will also use cognitive behavioral therapy to identify and change anxiety provoking thought and behavior patterns. Progress: 35% Target date will be August 12, 2023 [place], [facility]                  [place], [facility]               [place], [facility]               [place], [facility]               [place], [facility]               [place], [facility]               [place], [facility]               [place], [facility]               [place], [facility]

## 2023-03-04 ENCOUNTER — Ambulatory Visit: Payer: Managed Care, Other (non HMO) | Admitting: Physical Therapy

## 2023-03-04 ENCOUNTER — Encounter: Payer: Self-pay | Admitting: Physical Therapy

## 2023-03-04 VITALS — BP 81/55 | HR 101

## 2023-03-04 DIAGNOSIS — M6281 Muscle weakness (generalized): Secondary | ICD-10-CM

## 2023-03-04 DIAGNOSIS — R2689 Other abnormalities of gait and mobility: Secondary | ICD-10-CM | POA: Diagnosis not present

## 2023-03-04 DIAGNOSIS — R2681 Unsteadiness on feet: Secondary | ICD-10-CM

## 2023-03-04 NOTE — Therapy (Signed)
OUTPATIENT PHYSICAL THERAPY NEURO TREATMENT   Patient Name: Larry Riley MRN: [id number] DOB:02-28-1980, 43 y.o., male Today's Date: 03/04/2023   PCP: Corwin Levins., MD REFERRING PROVIDER: Juliann Pares, FNP  END OF SESSION:  PT End of Session - 03/04/23 1447     Visit Number 35    Number of Visits 37    Date for PT Re-Evaluation 03/05/23    Authorization Type [employer]    Authorization - Number of Visits 60    PT Start Time 1445    PT Stop Time 1526    PT Time Calculation (min) 41 min    Equipment Utilized During Treatment Gait belt    Activity Tolerance Patient tolerated treatment well    Behavior During Therapy WFL for tasks assessed/performed             Past Medical History:  Diagnosis Date   Anxiety    Depression    GERD (gastroesophageal reflux disease) 01/21/2017   MRSA (methicillin resistant Staphylococcus aureus) 2005   leg   Neurofibromatosis    tumor down spine and brain   Past Surgical History:  Procedure Laterality Date   hydrocephalus     shunt 1999-[place]   NECK SURGERY      tumor removal   SPINE SURGERY  10/2013   c2-t2 fusion  , laminectomy c1-6-- Dr Raynald Kemp-- [place]   VASECTOMY  02/2013   Patient Active Problem List   Diagnosis Date Noted   Skin lesion 10/24/2022   Urinary frequency 10/21/2022   Peripheral edema 08/17/2022   Hypotension 08/17/2022   Urinary incontinence 07/04/2022   Neurofibroma 04/23/2022   Neoplasm of unspecified behavior of bone, soft tissue, and skin 02/16/2022   Acute hearing loss, right 08/04/2021   Hyperglycemia 08/04/2021   Abscess of right foot 08/16/2020   Cellulitis of right foot 08/16/2020   Microhematuria 08/03/2020   Gastroesophageal reflux disease 07/07/2019   External otitis of right ear 03/09/2017   Thumb laceration, right, initial encounter 03/09/2017   Weakness of left side of body 11/19/2016   Stress due to illness of family member 06/10/2016   Syncope 04/23/2016   Onychomycosis  04/23/2016   Impacted ear wax 04/23/2016   Constipation due to opioid therapy 06/13/2014   Chronic pain syndrome 02/21/2014   Allergic reaction 02/20/2014   Neurofibromatosis, type 1 (HCC) 12/14/2013   Status post cervical arthrodesis 11/21/2013   Cervical myelopathy (HCC) 09/21/2013   Encounter for well adult exam with abnormal findings 06/23/2010   Disorder of autonomic nervous system 04/12/2009   Other psoriasis 08/24/2007   Tinea pedis 08/24/2007    ONSET DATE: 04-23-22  REFERRING DIAG:  Diagnosis  D49.2 (ICD-10-CM) - Neoplasm of unspecified behavior of bone, soft tissue, and skin    THERAPY DIAG:  Other abnormalities of gait and mobility  Unsteadiness on feet  Muscle weakness (generalized)  Rationale for Evaluation and Treatment: Rehabilitation  SUBJECTIVE STATEMENT:  "Larry Riley"   Patient reports he has been progressing HEP at home working on core and weighted exercises. He feels like this has been helpful. Reports more tightness in back because he has not had a chance to stretch.   Pt accompanied by:  spouse, [name]  PERTINENT HISTORY: s/p T11-L1 Laminectomies for intradural tumor resections & L3-L5 laminectomies for intradural tumor resections (hospitalization 04-23-22 - 04-29-22;  Neurofibromatosis 1 diagnosed in 1995  History of foot surgery 08/2020  Patient had metal removed from R foot and infected tissue removed  Neurofibromatosis (CMS/HCC)  Neurofibromatosis, type 1 (von Recklinghausen's disease) (CMS/HCC)  Status post cervical arthrodesis 11/21/2013 - cervical fusion C2-T2, laminectomy C1- C6  PAIN:   Are you having pain? Yes: NPRS scale: 6/10 Pain location: stiffness in neck and tailbone Pain description: stiff Aggravating factors: sitting Relieving factors: stretching Pain was  7-8/10 prior to surgery (on average day) - pt reports pain is much improved since surgery  PRECAUTIONS: Fall  RED FLAGS: Bowel or bladder incontinence: Yes: Improving - pt states he has mostly bladder incontinence at night; little control over starting/stopping  WEIGHT BEARING RESTRICTIONS: No  FALLS: Has patient fallen in last 6 months? Yes. Number of falls 2  LIVING ENVIRONMENT: Lives with: lives with their spouse Lives in: House/apartment Stairs: Yes: External: 2 steps; can reach both Has following equipment at home: Dan Humphreys - 2 wheeled, Wheelchair (manual), Tour manager, and hemiwalker  PLOF: Independent with basic ADLs, Independent with household mobility without device, Independent with community mobility without device, and Independent with transfers; pt drove prior to surgery on 04-23-22 (has not driven since)  PATIENT GOALS: "walk without use of device, as I was prior to surgery"; work on balance  OBJECTIVE:   DIAGNOSTIC FINDINGS: N/A  COGNITION: Overall cognitive status: Within functional limits for tasks assessed   SENSATION: Impaired in bil. LE's   COORDINATION: Decreased in bil. LE's wi  POSTURE: forward head, decreased thoracic kyphosis, and head is laterally flexed to Lt side  LOWER EXTREMITY ROM:   WFL's except for Lt ankle ROM - pt has worn AFO for > 4 yrs Decreased Rt active dorsiflexion and plantarflexion due to weakness  LOWER EXTREMITY MMT:    MMT Right Eval Left Eval  Hip flexion 5 5  Hip extension    Hip abduction    Hip adduction    Hip internal rotation    Hip external rotation    Knee flexion 4+ (seated position) 4 (seated position)  Knee extension 5 5  Ankle dorsiflexion 3- 0-1  Ankle plantarflexion    Ankle inversion    Ankle eversion    (Blank rows = not tested)  BED MOBILITY:  Modified independent   TRANSFERS: Assistive device utilized: Environmental consultant - 2 wheeled  Sit to stand: Modified independence short distances; SBA community amb.  With RW Stand to sit: Modified independence  CURB: TBA  STAIRS:  TBA  GAIT: Gait pattern: step through pattern, decreased ankle dorsiflexion- Right, decreased ankle dorsiflexion- Left, genu recurvatum- Left, and trunk rotated posterior- Left Distance walked: 72' Assistive device utilized: Environmental consultant - 2 wheeled Level of assistance: SBA Comments: AFO worn on LLE - carbon fiber with an anterior guard added (appears to be custom fabricated - pt states he obtained this brace approx. 4 yrs ago)  FUNCTIONAL TESTS:  Timed up and go (TUG): 18.97 secs with RW but pt was not safe - pt was instructed to ambulate at his normal speed but ambulated at fast pace and  was unsteady with turns so score is not totally accurate - CGA was provided for safety 10 meter walk test: 15.94 secs with RW = 2.06 ft/sec Berg Balance Scale: 26/56    PATIENT SURVEYS:  N/A for his diagnosis - s/p laminectomies for tumor resections   TODAY'S TREATMENT:  02-16-23  Self Care: Vitals:   03/04/23 1451 03/04/23 1505 03/04/23 1510 03/04/23 1511  BP: (!) 84/61 105/60 (!) 103/51 (!) 81/55  Pulse:  74 73 (!) 101     Start of session seated  After SciFit Seated on mat  Standing at mat   Assessed BP seated on RUE, BP very low and patient reporting mild increase fatigue levels; discussed safe BP ranges and advised patient and spouse start monitoring at home  After assessment performed discussed readings and recommended follow up with medical team for management, patient already wearing compression socks, discussed BP drops and what to look out for, educated on how patient's BP tends to increase with seated activities   TherAct: Attempted Scifit on level 1 with bilateral UE and LE to increase BP performed x 5 minutes   Walked to and from [employer] with 2WW + SBA-CGA, patient continues to perform far stand to sit transfer to mat despite extensive education in previous sessions   PATIENT EDUCATION:  Education details:  see above for  updated HEP - 02-11-23 Person educated: Patient and Spouse Education method: Explanation Education comprehension: verbalized understanding  HOME EXERCISE PROGRAM:   Access Code: [email or web address] URL: [email or web address] Date: 11/03/2022 Prepared by: Maebelle Munroe  Exercises - Prone Knee Flexion AROM  - 1 x daily - 7 x weekly - 1 sets - 10 reps - 3 sec hold - Beginner Clam  - 1 x daily - 7 x weekly - 1 sets - 10 reps - Hooklying Isometric Clamshell  - 1 x daily - 7 x weekly - 1 sets - 10 reps - 3 sec hold - SEATED SIDE PLANK - LEFT SIDE  - 1 x daily - 7 x weekly - 3 sets - 10 reps   Access Code: [email or web address] URL: [email or web address] Date: 10/13/2022 Prepared by: Maryruth Eve  Exercises - Sit to Stand  - 1 x daily - 7 x weekly - 2-3 sets - 10 reps - Sit to Stand with Resistance Around Legs  - 1 x daily - 7 x weekly - 2-3 sets - 5 reps - Standing March with Unilateral Counter Support  - 1 x daily - 7 x weekly - 3 sets - 20 reps - Standing 3-Way Leg Reach with Resistance at Ankles and Counter Support  - 1 x daily - 7 x weekly - 2-3 sets - 10 reps  Walking with hemiwalker with gait belt and spouse supervision ~1x day  [employer] [email or web address]  GOALS: Goals reviewed with patient? Yes     SHORT TERM GOALS: Target date: 02-05-23 (4 weeks)  Pt will perform sit to stand transfer without UE support from standard chair 3 reps and will stabilize upon initial standing without bracing with legs. Baseline:  bil. UE support needed from mat table; RUE support only no bracing w/ Les (11/04/22): 12-03-22 - balance/stability varies depending on fatigue; pt able to perform without UE support when legs are not fatigue, otherwise, needs to brace with legs for stability upon initial standing Goal status: Goal met 02-04-23   2.  Pt will amb. 30' with quad cane with SBA on flat, even surface for increased community accessibility. Baseline: pt using RW- modified independent  household, SBA community amb.;  115' x1  Goal status: Goal met 02-04-23  3.  Improve Berg balance test score to >/= 37/56 to demo improved standing balance. Baseline: 26/56; regressed to 22/56 in today's session; score 32/56 on 11-05-22:  score 34/56 on 01-08-23;  score 39/56 on 02-11-23 Goal status: Goal met 02-11-23  4.   Increase gait velocity to >/= 3.0 ft/sec with RW for increased gait efficiency. Baseline: 15.94 secs = 2.06 ft/sec; 15.06 sec = 2.18 ft/sec (11/04/22); 13.26 seconds = 2.47 ft/sec (11/19):  14.44 secs with RW = 2.27 ft/sec - 01-11-23;  14.28 secs with RW = 2.30 ft/sec Goal status: Not met 02-04-23  5.  Improve TUG score to </= 15 secs with RW with pt demonstrating correct hand placement with sit to/from stand transfers and safe speed, I.e. not rushing. Baseline: 18.97 secs with RW but not safe; improved to 20.6 seconds with SBQC (minA); 11.95 seconds with 2WW (CGA but cues for slower pacing moving forward for safety)  15.72 secs with RW with CGA - 01-11-23; 11.88 secs with RW - 02-11-23 but not at safe speed Goal status: Partially met - ONGOING - 02-11-23  6.  Independent in updated HEP for balance exercises.   Baseline: balance exercises added 01-11-23 Goal status: Goal met 02-04-23   NEW (REVISED) LONG TERM GOALS:  TARGET DATE 03-05-23 (8 weeks)   Pt will perform sit to stand transfer without UE support from standard chair 5 reps and will stabilize upon initial standing without bracing with legs. Baseline:  bil. UE support needed from mat table; RUE support only no bracing w/ Les (11/04/22): 12-03-22 - balance/stability varies depending on fatigue; pt able to perform without UE support when legs are not fatigue, otherwise, needs to brace with legs for stability upon initial standing Goal status:  ONGOING -  Partially met 01-08-23 - pt performed from mat table but performance varies based on fatigue  2.   Pt will amb. 350' with RW with supervision on flat, even surface for  increased community accessibility. Baseline: pt using RW- modified independent household, SBA community amb.;  115' x1  Goal status: ONGOING - Not met 01-11-23 - due to c/o low back tightening  3.  Improve Berg balance test score to >/= 40/56 to demo improved standing balance. Baseline: 26/56; score 34/56 - 01-08-23 Goal status: NOT MET (ongoing) 01-08-23  4.  Increase gait velocity to >/= 3.2 ft/sec with RW for increased gait efficiency. Baseline: 15.94 secs = 2.06 ft/sec; 15.06 sec = 2.18 ft/sec (11/04/22); 13.26 seconds = 2.47 ft/sec (11/19);  14.44 secs with RW = 2.27 ft/sec  Goal status: Upgraded - Partially met 01-11-23  5.  Improve TUG score to </= 15 secs with RW with pt demonstrating correct hand placement with sit to/from stand transfers and safe speed, I.e. not rushing. Baseline: 18.97 secs with RW but not safe; improved to 20.6 seconds with SBQC (minA); 11.95 seconds with 2WW (CGA but cues for slower pacing moving forward for safety);  15.72 secs with RW   Goal status: Partially met 01-11-23  ASSESSMENT:  CLINICAL IMPRESSION:  Session limited by patient presenting with BP drops consistent with orthostatic hyotension; educated on safety and encouraged follow up with medical team. Unable to assess goals in today's session and will require re-cert in future session as indicated. Discussed may require medical hold pending on if BP fluctuations continue to degree seen in today's session. Continue POC.   OBJECTIVE IMPAIRMENTS: Abnormal gait, decreased activity tolerance, decreased  balance, decreased coordination, decreased knowledge of use of DME, decreased strength, decreased safety awareness, and impaired sensation.   ACTIVITY LIMITATIONS: carrying, lifting, bending, standing, squatting, stairs, transfers, locomotion level, and driving  PARTICIPATION LIMITATIONS: meal prep, cleaning, laundry, driving, shopping, community activity, and yard work  PERSONAL FACTORS: Past/current  experiences and 1 comorbidity: h/o cervical tumors with s/p cervical fusion  are also affecting patient's functional outcome.   REHAB POTENTIAL: Good  CLINICAL DECISION MAKING: Evolving/moderate complexity  EVALUATION COMPLEXITY: Moderate  PLAN:  PT FREQUENCY:  1x/week  PT DURATION: 8 weeks (8 additional visits per renewal)  PLANNED INTERVENTIONS: Therapeutic exercises, Therapeutic activity, Neuromuscular re-education, Balance training, Gait training, Patient/Family education, Self Care, Stair training, Orthotic/Fit training, DME instructions, and Aquatic Therapy  PLAN FOR NEXT SESSION:   Modified plank?  Lifting each leg up/over object in seated position - on Sitfit? And try in long sitting  continue with standing balance, gait with SBQC (1 lap has been max distance prior to onset of LE weakness/instability), LE strengthening, core stabilization  work on gait training with SBQC SHORT distances (50') only:  any LLE strengthening exercises - closed chain, tall kneeling, etc.:  standing balance exercises/activities; functional strengthening (sit to stands, squats), quadruped, prone, Ramps and curbs w/ cane, core strengthening, low back stretches, balance seated on exercise ball, leg press, standing/half kneel core  Reaching forwards to R lower quarter, especially cross-body reaching, challenges core  Re-cert, did patient follow up about orthostatic hypotension?  Rosalita Chessman VHQION, PT 03/04/2023, 3:32 PM

## 2023-03-08 ENCOUNTER — Other Ambulatory Visit: Payer: Self-pay | Admitting: Internal Medicine

## 2023-03-08 ENCOUNTER — Encounter: Payer: Self-pay | Admitting: Internal Medicine

## 2023-03-08 DIAGNOSIS — G894 Chronic pain syndrome: Secondary | ICD-10-CM

## 2023-03-11 ENCOUNTER — Encounter: Payer: Self-pay | Admitting: Physical Therapy

## 2023-03-11 ENCOUNTER — Ambulatory Visit: Payer: Managed Care, Other (non HMO) | Admitting: Physical Therapy

## 2023-03-11 VITALS — BP 120/80

## 2023-03-11 DIAGNOSIS — R2689 Other abnormalities of gait and mobility: Secondary | ICD-10-CM | POA: Diagnosis not present

## 2023-03-11 DIAGNOSIS — R2681 Unsteadiness on feet: Secondary | ICD-10-CM

## 2023-03-11 NOTE — Therapy (Signed)
 OUTPATIENT PHYSICAL THERAPY NEURO TREATMENT   Patient Name: Larry Riley MRN: [id number] DOB:Oct 06, 1980, 43 y.o., male Today's Date: 03/11/2023   PCP: Corwin Levins., MD REFERRING PROVIDER: Juliann Pares, FNP  END OF SESSION:  PT End of Session - 03/11/23 1944     Visit Number 36    Number of Visits 37    Date for PT Re-Evaluation 03/05/23    Authorization Type [employer]    Authorization - Visit Number 6    Authorization - Number of Visits 60    PT Start Time 1530    PT Stop Time 1618    PT Time Calculation (min) 48 min    Equipment Utilized During Treatment Gait belt    Activity Tolerance Patient tolerated treatment well    Behavior During Therapy [facility] for tasks assessed/performed                            Past Medical History:  Diagnosis Date   Anxiety    Depression    GERD (gastroesophageal reflux disease) 01/21/2017   MRSA (methicillin resistant Staphylococcus aureus) 2005   leg   Neurofibromatosis    tumor down spine and brain   Past Surgical History:  Procedure Laterality Date   hydrocephalus     shunt 1999-[place]   NECK SURGERY      tumor removal   SPINE SURGERY  10/2013   c2-t2 fusion  , laminectomy c1-6-- Dr Raynald Kemp-- [place]   VASECTOMY  02/2013   Patient Active Problem List   Diagnosis Date Noted   Skin lesion 10/24/2022   Urinary frequency 10/21/2022   Peripheral edema 08/17/2022   Hypotension 08/17/2022   Urinary incontinence 07/04/2022   Neurofibroma 04/23/2022   Neoplasm of unspecified behavior of bone, soft tissue, and skin 02/16/2022   Acute hearing loss, right 08/04/2021   Hyperglycemia 08/04/2021   Abscess of right foot 08/16/2020   Cellulitis of right foot 08/16/2020   Microhematuria 08/03/2020   Gastroesophageal reflux disease 07/07/2019   External otitis of right ear 03/09/2017   Thumb laceration, right, initial encounter 03/09/2017   Weakness of left side of body 11/19/2016   Stress due to illness of  family member 06/10/2016   Syncope 04/23/2016   Onychomycosis 04/23/2016   Impacted ear wax 04/23/2016   Constipation due to opioid therapy 06/13/2014   Chronic pain syndrome 02/21/2014   Allergic reaction 02/20/2014   Neurofibromatosis, type 1 (HCC) 12/14/2013   Status post cervical arthrodesis 11/21/2013   Cervical myelopathy (HCC) 09/21/2013   Encounter for well adult exam with abnormal findings 06/23/2010   Disorder of autonomic nervous system 04/12/2009   Other psoriasis 08/24/2007   Tinea pedis 08/24/2007    ONSET DATE: 04-23-22  REFERRING DIAG:  Diagnosis  D49.2 (ICD-10-CM) - Neoplasm of unspecified behavior of bone, soft tissue, and skin    THERAPY DIAG:  Other abnormalities of gait and mobility  Unsteadiness on feet  Rationale for Evaluation and Treatment: Rehabilitation  SUBJECTIVE STATEMENT:  "Larry Riley"      Pt reports he continues to progress his workouts at home - is now able to stand against wall and use 6# medicine ball and keep his balance with only slightly touching wall with his hips  Pt accompanied by:  spouse, [name]  PERTINENT HISTORY: s/p T11-L1 Laminectomies for intradural tumor resections & L3-L5 laminectomies for intradural tumor resections (hospitalization 04-23-22 - 04-29-22;  Neurofibromatosis 1 diagnosed in 1995  History of foot surgery 08/2020  Patient had metal removed from R foot and infected tissue removed  Neurofibromatosis (CMS/HCC)  Neurofibromatosis, type 1 (von Recklinghausen's disease) (CMS/HCC)  Status post cervical arthrodesis 11/21/2013 - cervical fusion C2-T2, laminectomy C1- C6  PAIN:   Are you having pain? Yes: NPRS scale: 6/10 Pain location: stiffness in neck and tailbone Pain description: stiff Aggravating factors: sitting Relieving factors:  stretching Pain was 7-8/10 prior to surgery (on average day) - pt reports pain is much improved since surgery  PRECAUTIONS: Fall  RED FLAGS: Bowel or bladder incontinence: Yes: Improving - pt states he has mostly bladder incontinence at night; little control over starting/stopping  WEIGHT BEARING RESTRICTIONS: No  FALLS: Has patient fallen in last 6 months? Yes. Number of falls 2  LIVING ENVIRONMENT: Lives with: lives with their spouse Lives in: House/apartment Stairs: Yes: External: 2 steps; can reach both Has following equipment at home: Dan Humphreys - 2 wheeled, Wheelchair (manual), Tour manager, and hemiwalker  PLOF: Independent with basic ADLs, Independent with household mobility without device, Independent with community mobility without device, and Independent with transfers; pt drove prior to surgery on 04-23-22 (has not driven since)  PATIENT GOALS: "walk without use of device, as I was prior to surgery"; work on balance  OBJECTIVE:   DIAGNOSTIC FINDINGS: N/A  COGNITION: Overall cognitive status: Within functional limits for tasks assessed   SENSATION: Impaired in bil. LE's   COORDINATION: Decreased in bil. LE's wi  POSTURE: forward head, decreased thoracic kyphosis, and head is laterally flexed to Lt side  LOWER EXTREMITY ROM:   WFL's except for Lt ankle ROM - pt has worn AFO for > 4 yrs Decreased Rt active dorsiflexion and plantarflexion due to weakness  LOWER EXTREMITY MMT:    MMT Right Eval Left Eval  Hip flexion 5 5  Hip extension    Hip abduction    Hip adduction    Hip internal rotation    Hip external rotation    Knee flexion 4+ (seated position) 4 (seated position)  Knee extension 5 5  Ankle dorsiflexion 3- 0-1  Ankle plantarflexion    Ankle inversion    Ankle eversion    (Blank rows = not tested)  BED MOBILITY:  Modified independent   TRANSFERS: Assistive device utilized: Environmental consultant - 2 wheeled  Sit to stand: Modified independence short  distances; SBA community amb. With RW Stand to sit: Modified independence  CURB: TBA  STAIRS:  TBA  GAIT: Gait pattern: step through pattern, decreased ankle dorsiflexion- Right, decreased ankle dorsiflexion- Left, genu recurvatum- Left, and trunk rotated posterior- Left Distance walked: 42' Assistive device utilized: Environmental consultant - 2 wheeled Level of assistance: SBA Comments: AFO worn on LLE - carbon fiber with an anterior guard added (appears to be custom fabricated - pt states he obtained this brace approx. 4 yrs ago)  FUNCTIONAL TESTS:  Timed up and go (TUG): 18.97 secs with RW but pt was not safe - pt was instructed to ambulate at his normal speed but ambulated at fast pace  and was unsteady with turns so score is not totally accurate - CGA was provided for safety 10 meter walk test: 15.94 secs with RW = 2.06 ft/sec Berg Balance Scale: 26/56    PATIENT SURVEYS:  N/A for his diagnosis - s/p laminectomies for tumor resections  2:25 2 laps - with [facility]     TODAY'S TREATMENT:  03-11-23  Vitals:   03/11/23 1533  BP: 120/80      [facility] PT Assessment - 03/11/23 0001       Berg Balance Test   Sit to Stand Able to stand without using hands and stabilize independently    Standing Unsupported Able to stand safely 2 minutes    Sitting with Back Unsupported but Feet Supported on Floor or Stool Able to sit safely and securely 2 minutes    Stand to Sit Sits safely with minimal use of hands    Transfers Able to transfer safely, minor use of hands    Standing Unsupported with Eyes Closed Able to stand 10 seconds with supervision    Standing Unsupported with Feet Together Able to place feet together independently and stand for 1 minute with supervision    From Standing, Reach Forward with Outstretched Arm Can reach forward >12 cm safely (5")    From Standing Position, Pick up Object from Floor Able to pick up shoe, needs supervision    From Standing Position, Turn to Look Behind Over each  Shoulder Turn sideways only but maintains balance    Turn 360 Degrees Needs close supervision or verbal cueing    Standing Unsupported, Alternately Place Feet on Step/Stool Able to complete >2 steps/needs minimal assist    Standing Unsupported, One Foot in Front Able to take small step independently and hold 30 seconds    Standing on One Leg Tries to lift leg/unable to hold 3 seconds but remains standing independently    Total Score 39             Pt performed 5 reps sit to stand transfers from high/low mat table without UE support with SBA    GAIT: Gait pattern: step through pattern,30 decreased ankle dorsiflexion- Right, decreased ankle dorsiflexion- Left, genu recurvatum- Left, and trunk rotated posterior- Left Distance walked:  230' at beginning of session  Assistive device utilized:  SBQC Level of assistance: CGA; Supervision with use of RW Comments: AFO worn on LLE - carbon fiber with an anterior guard added (appears to be custom fabricated - pt states he obtained this brace approx. 4 yrs ago)               Gait velocity = 13.43 secs with RW = 2.44 ft/sec; Rt foot slap noted in stance due to decreased eccentric dorsiflexor strength  TUG score = 14.22 secs with RW    PATIENT EDUCATION:  Education details:  see above for updated HEP - 02-11-23 Person educated: Patient and Spouse Education method: Explanation Education comprehension: verbalized understanding  HOME EXERCISE PROGRAM:   Access Code: [email or web address] URL: [email or web address] Date: 11/03/2022 Prepared by: Maebelle Munroe  Exercises - Prone Knee Flexion AROM  - 1 x daily - 7 x weekly - 1 sets - 10 reps - 3 sec hold - Beginner Clam  - 1 x daily - 7 x weekly - 1 sets - 10 reps - Hooklying Isometric Clamshell  - 1 x daily - 7 x weekly - 1 sets - 10 reps - 3 sec hold - SEATED SIDE PLANK - LEFT SIDE  - 1  x daily - 7 x weekly - 3 sets - 10 reps   Access Code: [email or web address] URL:  [email or web address] Date: 10/13/2022 Prepared by: Maryruth Eve  Exercises - Sit to Stand  - 1 x daily - 7 x weekly - 2-3 sets - 10 reps - Sit to Stand with Resistance Around Legs  - 1 x daily - 7 x weekly - 2-3 sets - 5 reps - Standing March with Unilateral Counter Support  - 1 x daily - 7 x weekly - 3 sets - 20 reps - Standing 3-Way Leg Reach with Resistance at Ankles and Counter Support  - 1 x daily - 7 x weekly - 2-3 sets - 10 reps  Walking with hemiwalker with gait belt and spouse supervision ~1x day  Medbridge [email or web address]  GOALS: Goals reviewed with patient? Yes     SHORT TERM GOALS: Target date: 02-05-23 (4 weeks)  Pt will perform sit to stand transfer without UE support from standard chair 3 reps and will stabilize upon initial standing without bracing with legs. Baseline:  bil. UE support needed from mat table; RUE support only no bracing w/ Les (11/04/22): 12-03-22 - balance/stability varies depending on fatigue; pt able to perform without UE support when legs are not fatigue, otherwise, needs to brace with legs for stability upon initial standing Goal status: Goal met 02-04-23   2.  Pt will amb. 30' with quad cane with SBA on flat, even surface for increased community accessibility. Baseline: pt using RW- modified independent household, SBA community amb.;  115' x1  Goal status: Goal met 02-04-23  3.  Improve Berg balance test score to >/= 37/56 to demo improved standing balance. Baseline: 26/56; regressed to 22/56 in today's session; score 32/56 on 11-05-22:  score 34/56 on 01-08-23;  score 39/56 on 02-11-23 Goal status: Goal met 02-11-23  4.   Increase gait velocity to >/= 3.0 ft/sec with RW for increased gait efficiency. Baseline: 15.94 secs = 2.06 ft/sec; 15.06 sec = 2.18 ft/sec (11/04/22); 13.26 seconds = 2.47 ft/sec (11/19):  14.44 secs with RW = 2.27 ft/sec - 01-11-23;  14.28 secs with RW = 2.30 ft/sec Goal status: Not met 02-04-23  5.  Improve TUG  score to </= 15 secs with RW with pt demonstrating correct hand placement with sit to/from stand transfers and safe speed, I.e. not rushing. Baseline: 18.97 secs with RW but not safe; improved to 20.6 seconds with SBQC (minA); 11.95 seconds with 2WW (CGA but cues for slower pacing moving forward for safety)  15.72 secs with RW with CGA - 01-11-23; 11.88 secs with RW - 02-11-23 but not at safe speed Goal status: Partially met - ONGOING - 02-11-23  6.  Independent in updated HEP for balance exercises.   Baseline: balance exercises added 01-11-23 Goal status: Goal met 02-04-23   NEW (REVISED) LONG TERM GOALS:  TARGET DATE 03-05-23 (8 weeks)   Pt will perform sit to stand transfer without UE support from standard chair 5 reps and will stabilize upon initial standing without bracing with legs. Baseline:  bil. UE support needed from mat table; RUE support only no bracing w/ Les (11/04/22): 12-03-22 - balance/stability varies depending on fatigue; pt able to perform without UE support when legs are not fatigue, otherwise, needs to brace with legs for stability upon initial standing Goal status:  ONGOING -  Partially met 01-08-23 - pt performed from mat table but performance varies based on fatigue;  Goal met 03-11-23  2.   Pt will  amb. 350' with RW with supervision on flat, even surface for increased community accessibility. Baseline: pt using RW- modified independent household, SBA community amb.;  115' x1;  03-11-23 230' with SBQC with CGA Goal status: Goal met 03-11-23:    3.  Improve Berg balance test score to >/= 40/56 to demo improved standing balance. Baseline: 26/56; score 34/56 - 01-08-23: score 39/56 on 03-11-23 Goal status: NOT MET 03-11-23  4.  Increase gait velocity to >/= 3.2 ft/sec with RW for increased gait efficiency. Baseline: 15.94 secs = 2.06 ft/sec; 15.06 sec = 2.18 ft/sec (11/04/22); 13.26 seconds = 2.47 ft/sec (11/19);  14.44 secs with RW = 2.27 ft/sec;  03-11-23  13.43 secs=  2.44  ft/sec Goal status: Not met 03-11-23  5.  Improve TUG score to </= 15 secs with RW with pt demonstrating correct hand placement with sit to/from stand transfers and safe speed, I.e. not rushing. Baseline: 18.97 secs with RW but not safe; improved to 20.6 seconds with SBQC (minA); 11.95 seconds with 2WW (CGA but cues for slower pacing moving forward for safety);  15.72 secs with RW;  03-11-23:  14.22 secs with RW Goal status: Goal met 03-11-23  ASSESSMENT:  CLINICAL IMPRESSION:  PT session focused on assessment of LTG's as today's session was end of POC.  Pt's Berg balance test score remains 39/56, same score when previously assessed 1 month ago.  Pt's TUG score with RW is 1 sec faster with score 14.22 secs with pt amb. At slightly faster than his normal gait speed.  Pt has plateaued in maximizing functional progress at this time; continues to require use of RW for assistance with community amb. With pt able to ambulate short distances with use of SBQC on flat, even surfaces with SBA for safety.  Plan is to place pt on hold for 4 weeks - pt to continue to work on HEP and will return for PT re-eval after appts with MD, if he so chooses.  Aquatic PT has been recommended - pt to discuss this service with urologist and will decide if this service is appropriate. Pt has met LTG's #1,2 and 5:  LTG's #3 and 4 not met due to continued balance and LE strength deficits.   OBJECTIVE IMPAIRMENTS: Abnormal gait, decreased activity tolerance, decreased balance, decreased coordination, decreased knowledge of use of DME, decreased strength, decreased safety awareness, and impaired sensation.   ACTIVITY LIMITATIONS: carrying, lifting, bending, standing, squatting, stairs, transfers, locomotion level, and driving  PARTICIPATION LIMITATIONS: meal prep, cleaning, laundry, driving, shopping, community activity, and yard work  PERSONAL FACTORS: Past/current experiences and 1 comorbidity: h/o cervical tumors with s/p  cervical fusion  are also affecting patient's functional outcome.   REHAB POTENTIAL: Good  CLINICAL DECISION MAKING: Evolving/moderate complexity  EVALUATION COMPLEXITY: Moderate  PLAN:  PT FREQUENCY:  1x/week  PT DURATION: 8 weeks (8 additional visits per renewal)  PLANNED INTERVENTIONS: Therapeutic exercises, Therapeutic activity, Neuromuscular re-education, Balance training, Gait training, Patient/Family education, Self Care, Stair training, Orthotic/Fit training, DME instructions, and Aquatic Therapy  PLAN FOR NEXT SESSION:   Pt is on HOLD for 4 weeks - will continue with HEP - will call for follow up appt if he decides to return for re-eval -- if not, will D/C at that time    Kerry Fort, PT 03/11/2023, 7:47 PM

## 2023-03-30 ENCOUNTER — Encounter: Payer: Self-pay | Admitting: Internal Medicine

## 2023-03-30 ENCOUNTER — Other Ambulatory Visit: Payer: Self-pay | Admitting: Internal Medicine

## 2023-03-31 ENCOUNTER — Encounter: Payer: Self-pay | Admitting: Behavioral Health

## 2023-03-31 ENCOUNTER — Other Ambulatory Visit: Payer: Self-pay

## 2023-03-31 ENCOUNTER — Ambulatory Visit: Payer: 59 | Admitting: Behavioral Health

## 2023-03-31 DIAGNOSIS — F331 Major depressive disorder, recurrent, moderate: Secondary | ICD-10-CM

## 2023-03-31 DIAGNOSIS — F411 Generalized anxiety disorder: Secondary | ICD-10-CM

## 2023-03-31 MED ORDER — TIZANIDINE HCL 4 MG PO TABS: 4.0000 mg | ORAL_TABLET | Freq: Four times a day (QID) | ORAL | 3 refills | Status: AC | PRN

## 2023-03-31 NOTE — Progress Notes (Signed)
 [facility] Counselor/Therapist Progress Note  Patient ID: Larry Riley, MRN: [id number],    Date: 03/31/2023  Time Spent: 55 minutes, 1002 to 1057 AM this session was held via video teletherapy. The patient consented to the video teletherapy and was located in his home office during this session.  He is aware it is the responsibility of the patient to secure confidentiality on his end of the session. The provider was in a private home office for the duration of this session.      Treatment Type: Individual Therapy  Reported Symptoms: Anxiety/stress  Mental Status Exam: Appearance:  Casual     Behavior: Appropriate  Motor: Normal  Speech/Language:  Normal Rate  Affect: Appropriate  Mood: normal  Thought process: normal  Thought content:   WNL  Sensory/Perceptual disturbances:   WNL  Orientation: oriented to person, place, time/date, situation, day of week, and month of year  Attention: Good  Concentration: Good  Memory: WNL  Fund of knowledge:  Good  Insight:   Good  Judgment:  Good  Impulse Control: Good   Risk Assessment: Danger to Self:  No Self-injurious Behavior: No Danger to Others: No Duty to Warn:no Physical Aggression / Violence:No  Access to Firearms a concern: No  Gang Involvement:No   Subjective: There have been a lot of positives medically for the patient.  His doctor feels that he is progressing well after the surgery.  The tumors on his spine have not grown which is a nice positive change of pace.  He is physically getting stronger and continues to work with physical therapist.  He and his wife are taking 5 days at the beach for their 10th anniversary which will be the longest time he has been away from home since his surgery and both are looking forward to it.  He said they are taking all the necessary precautions but both are looking forward to it.  There are still some frustrations with handling his father's care.  He did talk to his sister  but not about the father's care but may address that with her in the next conversation.  He is thankful that he seems to have open the window of communication with her again.  He is doing as much as she can in the time that he has to work with people in the system in [place] where his father lives to get some things addressed but knows that he is doing all that he can do not being there.  Otherwise his coping skills have been very beneficial to him in a very stressful time.  He says a year ago 6 months ago he would have gotten extremely angry and not handled it well but he sees the growth that has taken place.  I did introduce a new [place] to the vagus nerve stimulation coping skill encouraging him to practice that.   He does contract for safety having no thoughts of hurting himself or anyone else. Interventions: Cognitive Behavioral Therapy  Diagnosis: Generalized anxiety disorder  Plan: I will meet with the patient every 2 to 3 weeks via care agility.  Treatment plan: We will use cognitive behavioral therapy as well as dialectical behavior therapy to help the patient reduce anxiety/stress primarily related to medical issues.  The goal is to reduce anxiety by 50% with a target date of February 12, 2023.  Goals for reducing anxiety include helping him manage thoughts and worrisome thinking contributing to feelings of anxiety as well as recognition of those thoughts.  We will look to resolve any core conflicts outside of the medical and health issues that are contributing to anxiety including family situations and relationships.  In addition to that we will identify causes for anxiety and explore ways to lower it as well as improve his ability to manage anxiety symptoms and better handle stress.  Interventions will be to provide education about anxiety and stress to help him identify causes and symptoms.  Introduce coping skills and problem solution skills to manage anxiety including DBT distress  tolerance and mindfulness skills.  We will also use cognitive behavioral therapy to identify and change anxiety provoking thought and behavior patterns. Progress: 35% Target date will be August 12, 2023 [place], [facility]                  [place], [facility]               [place], [facility]               [place], [facility]               [place], [facility]               [place], [facility]               [place], [facility]               [place], [facility]               [place], [facility]               [place], [facility]

## 2023-04-29 ENCOUNTER — Encounter: Payer: Self-pay | Admitting: Behavioral Health

## 2023-04-29 ENCOUNTER — Ambulatory Visit: Admitting: Behavioral Health

## 2023-04-29 DIAGNOSIS — F411 Generalized anxiety disorder: Secondary | ICD-10-CM

## 2023-04-29 DIAGNOSIS — R454 Irritability and anger: Secondary | ICD-10-CM

## 2023-04-29 NOTE — Progress Notes (Signed)
 [facility] Counselor/Therapist Progress Note  Patient ID: Larry Riley, MRN: [id number],    Date: 04/29/2023  Time Spent: 55 minutes, 1 PM until 1:56 PM.  This session was held via video teletherapy. The patient consented to the video teletherapy and was located in his home office during this session.  He is aware it is the responsibility of the patient to secure confidentiality on his end of the session. The provider was in a private home office for the duration of this session.      Treatment Type: Individual Therapy  Reported Symptoms: Anxiety/stress  Mental Status Exam: Appearance:  Casual     Behavior: Appropriate  Motor: Normal  Speech/Language:  Normal Rate  Affect: Appropriate  Mood: normal  Thought process: normal  Thought content:   WNL  Sensory/Perceptual disturbances:   WNL  Orientation: oriented to person, place, time/date, situation, day of week, and month of year  Attention: Good  Concentration: Good  Memory: WNL  Fund of knowledge:  Good  Insight:   Good  Judgment:  Good  Impulse Control: Good   Risk Assessment: Danger to Self:  No Self-injurious Behavior: No Danger to Others: No Duty to Warn:no Physical Aggression / Violence:No  Access to Firearms a concern: No  Gang Involvement:No   Subjective: The patient got a good result from a neurologist today and does not have to be reviewed until a year from now.  He is now walking with a cane more often and walked 12 minutes continually before feeling extremely tired.  He and his wife are leaving the day after Easter to go to the beach for a week and I will be the only time he has been out of town overnight in over a year and is looking forward to time away for both he and his wife.  He has been through a lot physically but appreciates all that she has done in helping care for him as well as maintain the home.  He is for the first time in a while growing in his profession and feeling that he is  recognized for what he brings to the table and feels there is chance to continue to improve in the company and in his craft.  He is getting a little things accomplished in terms of his father's things but still is frustrated with getting some things done but knows that he is controlling what he can do.  We talked about some different things that he might do to help his father's old home, selling XR etc. so they can pay some of the bills where his father is living now.  He is going to reach out to some different organizations as well as some old friends to see if they might provide some support also.  He reports that his irritability and anxiety have gone down significantly and using coping skills to his benefit.  He does contract for safety having no thoughts of hurting himself or anyone else. Interventions: Cognitive Behavioral Therapy  Diagnosis: Generalized anxiety disorder  Plan: I will meet with the patient every 2 to 3 weeks via care agility.  Treatment plan: We will use cognitive behavioral therapy as well as dialectical behavior therapy to help the patient reduce anxiety/stress primarily related to medical issues.  The goal is to reduce anxiety by 50% with a target date of February 12, 2023.  Goals for reducing anxiety include helping him manage thoughts and worrisome thinking contributing to feelings of anxiety as well as recognition  of those thoughts.  We will look to resolve any core conflicts outside of the medical and health issues that are contributing to anxiety including family situations and relationships.  In addition to that we will identify causes for anxiety and explore ways to lower it as well as improve his ability to manage anxiety symptoms and better handle stress.  Interventions will be to provide education about anxiety and stress to help him identify causes and symptoms.  Introduce coping skills and problem solution skills to manage anxiety including DBT distress tolerance and  mindfulness skills.  We will also use cognitive behavioral therapy to identify and change anxiety provoking thought and behavior patterns. Progress: 35% Target date will be August 12, 2023 Cecile Coder, [facility]                  Cecile Coder, [facility]               Cecile Coder, [facility]               Cecile Coder, [facility]               Cecile Coder, [facility]               Cecile Coder, [facility]               Cecile Coder, [facility]               Cecile Coder, [facility]               Cecile Coder, [facility]               Cecile Coder, [facility]               Cecile Coder, [facility]

## 2023-05-26 ENCOUNTER — Encounter: Payer: Self-pay | Admitting: Behavioral Health

## 2023-05-26 ENCOUNTER — Ambulatory Visit (INDEPENDENT_AMBULATORY_CARE_PROVIDER_SITE_OTHER): Admitting: Behavioral Health

## 2023-05-26 DIAGNOSIS — F411 Generalized anxiety disorder: Secondary | ICD-10-CM | POA: Diagnosis not present

## 2023-05-26 DIAGNOSIS — R454 Irritability and anger: Secondary | ICD-10-CM

## 2023-05-26 NOTE — Progress Notes (Signed)
 [facility] Counselor/Therapist Progress Note  Patient ID: Larry Riley, MRN: [id number],    Date: 05/26/2023  Time Spent: 10 a.m. until 10:58 AM, 58 minutes.  This session was held via video teletherapy. The patient consented to the video teletherapy and was located in his home office during this session.  He is aware it is the responsibility of the patient to secure confidentiality on his end of the session. The provider was in a private home office for the duration of this session.      Treatment Type: Individual Therapy  Reported Symptoms: Anxiety/stress  Mental Status Exam: Appearance:  Casual     Behavior: Appropriate  Motor: Normal  Speech/Language:  Normal Rate  Affect: Appropriate  Mood: normal  Thought process: normal  Thought content:   WNL  Sensory/Perceptual disturbances:   WNL  Orientation: oriented to person, place, time/date, situation, day of week, and month of year  Attention: Good  Concentration: Good  Memory: WNL  Fund of knowledge:  Good  Insight:   Good  Judgment:  Good  Impulse Control: Good   Risk Assessment: Danger to Self:  No Self-injurious Behavior: No Danger to Others: No Duty to Warn:no Physical Aggression / Violence:No  Access to Firearms a concern: No  Gang Involvement:No   Subjective: The patient and his wife had a good time each./Sister and mother also spent some time down there which they enjoyed.  He was able to get down onto the beach a little bit and even otherwise some difficulty felt satisfaction in doing so and they walked through the mouth every day at the beach to keep the strength up.  They report to go out for dinner anniversary.  Physically he continues to progress and is pretty hard work in physical therapy.  He had a meeting with his boss that she is pleased with his work.  He said she is more hands off which in some way appreciates but wishes he  had a little more guidance.  He is able to do a little more  around the house that spends much as he can.  He briefly talked about his wife's history and why she is not able to work right now.  He would like for her to have some therapy and is looking for ways to tolerate in a loving way the benefits that he knows she has got from that in the past and what it would again.  He also has had some progress with his father and that his sister is going to see his father today which has not happened in a long time.  He is hoping to plan a trip sometime mid summer to be able to go up there and start cleaning some things out which feels more positive.  He also got an appointment lined up for his father with Social Security so that he hopefully can get the payment structures straightened out and his father gets him back pain and get back on a regular monthly payment.  As he feels that he is handling things fairly well we are scheduled for July of this year.  He is aware that he can call if he needs to come in sooner. He does contract for safety having no thoughts of hurting himself or anyone else. Interventions: Cognitive Behavioral Therapy  Diagnosis: Generalized anxiety disorder  Plan: I will meet with the patient every 2 to 3 weeks via care agility.  Treatment plan: We will use cognitive behavioral therapy as well as  dialectical behavior therapy to help the patient reduce anxiety/stress primarily related to medical issues.  The goal is to reduce anxiety by 50% with a target date of February 12, 2023.  Goals for reducing anxiety include helping him manage thoughts and worrisome thinking contributing to feelings of anxiety as well as recognition of those thoughts.  We will look to resolve any core conflicts outside of the medical and health issues that are contributing to anxiety including family situations and relationships.  In addition to that we will identify causes for anxiety and explore ways to lower it as well as improve his ability to manage anxiety symptoms and  better handle stress.  Interventions will be to provide education about anxiety and stress to help him identify causes and symptoms.  Introduce coping skills and problem solution skills to manage anxiety including DBT distress tolerance and mindfulness skills.  We will also use cognitive behavioral therapy to identify and change anxiety provoking thought and behavior patterns. Progress: 35% Target date will be August 12, 2023 Cecile Coder, [facility]                  Cecile Coder, [facility]               Cecile Coder, [facility]               Cecile Coder, [facility]               Cecile Coder, [facility]               Cecile Coder, [facility]               Cecile Coder, [facility]               Cecile Coder, [facility]               Cecile Coder, [facility]               Cecile Coder, [facility]               Cecile Coder, [facility]               Cecile Coder, [facility]

## 2023-06-04 ENCOUNTER — Other Ambulatory Visit: Payer: Self-pay | Admitting: Internal Medicine

## 2023-06-04 ENCOUNTER — Encounter: Payer: Self-pay | Admitting: Internal Medicine

## 2023-06-04 DIAGNOSIS — G894 Chronic pain syndrome: Secondary | ICD-10-CM

## 2023-06-08 MED ORDER — TRAMADOL HCL 50 MG PO TABS
50.0000 mg | ORAL_TABLET | Freq: Three times a day (TID) | ORAL | 2 refills | Status: DC | PRN
Start: 2023-06-08 — End: 2023-09-14

## 2023-07-12 ENCOUNTER — Encounter: Payer: Self-pay | Admitting: Internal Medicine

## 2023-07-12 ENCOUNTER — Other Ambulatory Visit: Payer: Self-pay

## 2023-07-12 MED ORDER — GABAPENTIN 600 MG PO TABS: 600.0000 mg | ORAL_TABLET | Freq: Three times a day (TID) | ORAL | 1 refills | Status: DC

## 2023-07-12 NOTE — Telephone Encounter (Signed)
 Refil has been sent.

## 2023-07-21 ENCOUNTER — Encounter: Payer: Self-pay | Admitting: Internal Medicine

## 2023-07-21 DIAGNOSIS — E559 Vitamin D deficiency, unspecified: Secondary | ICD-10-CM

## 2023-07-21 DIAGNOSIS — Z114 Encounter for screening for human immunodeficiency virus [HIV]: Secondary | ICD-10-CM

## 2023-07-21 DIAGNOSIS — R739 Hyperglycemia, unspecified: Secondary | ICD-10-CM

## 2023-07-21 DIAGNOSIS — Z1159 Encounter for screening for other viral diseases: Secondary | ICD-10-CM

## 2023-07-21 DIAGNOSIS — E538 Deficiency of other specified B group vitamins: Secondary | ICD-10-CM

## 2023-07-21 DIAGNOSIS — Z125 Encounter for screening for malignant neoplasm of prostate: Secondary | ICD-10-CM

## 2023-07-23 NOTE — Telephone Encounter (Signed)
 Ok to assist pt with CPX appt with labs prior that I will place in the orders   - anytime now at his convenience, as his last visit was oct 2024

## 2023-07-28 ENCOUNTER — Ambulatory Visit (INDEPENDENT_AMBULATORY_CARE_PROVIDER_SITE_OTHER): Admitting: Behavioral Health

## 2023-07-28 ENCOUNTER — Encounter: Payer: Self-pay | Admitting: Behavioral Health

## 2023-07-28 DIAGNOSIS — F411 Generalized anxiety disorder: Secondary | ICD-10-CM

## 2023-07-28 DIAGNOSIS — R454 Irritability and anger: Secondary | ICD-10-CM

## 2023-07-28 NOTE — Progress Notes (Signed)
 [facility] Counselor/Therapist Progress Note  Patient ID: Larry Riley, MRN: [id number],    Date: 07/28/2023  Time Spent: 10;03 a.m. until 10:58 AM, 55 minutes.  This session was held via video teletherapy. The patient consented to the video teletherapy and was located in his home office during this session.  He is aware it is the responsibility of the patient to secure confidentiality on his end of the session. The provider was in a private home office for the duration of this session.      Treatment Type: Individual Therapy  Reported Symptoms: Anxiety/stress  Mental Status Exam: Appearance:  Casual     Behavior: Appropriate  Motor: Normal  Speech/Language:  Normal Rate  Affect: Appropriate  Mood: normal  Thought process: normal  Thought content:   WNL  Sensory/Perceptual disturbances:   WNL  Orientation: oriented to person, place, time/date, situation, day of week, and month of year  Attention: Good  Concentration: Good  Memory: WNL  Fund of knowledge:  Good  Insight:   Good  Judgment:  Good  Impulse Control: Good   Risk Assessment: Danger to Self:  No Self-injurious Behavior: No Danger to Others: No Duty to Warn:no Physical Aggression / Violence:No  Access to Firearms a concern: No  Gang Involvement:No   Subjective: The past few weeks for the patient have been overwhelming.  Summer has become busier at work and he has taken all additional projects and he says he is working some days 10 1112 hrs. a day trying to get it all done.  He enjoys the work but knows there have to be limits.  He is spending some time during the day trying to work on things for his father and that extends to work today's home.  There is some conversation about he and some of his coworkers who are disabled having to come into the office 2 to 3 days a week but he feels that can be work through as he cannot drive yet based on recovering from his surgery.  Not too anxious about that part  of it but does prefer working at home.  He has seen significant cognitive decline with his biological father which is difficult for him to watch especially as he lives here in his father in [place] .  He does have power of attorney and is working on getting some other ability to make decisions for his father as his father is not capable necessarily.  His siblings are not being helpful in this situation.  There are several situations in which he had handled them remotely making multiple phone calls and talking to multiple people.  He says he cannot get a clear diagnosis other than dementia with nothing more specific than that which is difficult for him.  He feels like he and his wife are in a better place and that they have gone through the crisis of his surgery and recovery and now starting to be able to interact getting out of survival mode.  He is able to do a little more around the house to give her a break but also wants to give her a reason to have something of purpose for such as working part-time volunteering etc. as well as getting back into therapy.  We talked about how parts of her life can be addressed a little bit of time to make the overall whole lot better.  He does contract for safety having no thoughts of hurting himself or anyone else. Interventions: Cognitive Behavioral Therapy  Diagnosis: Generalized anxiety disorder  Plan: I will meet with the patient every 2 to 3 weeks via care agility.  Treatment plan: We will use cognitive behavioral therapy as well as dialectical behavior therapy to help the patient reduce anxiety/stress primarily related to medical issues.  The goal is to reduce anxiety by 50% with a target date of February 12, 2023.  Goals for reducing anxiety include helping him manage thoughts and worrisome thinking contributing to feelings of anxiety as well as recognition of those thoughts.  We will look to resolve any core conflicts outside of the medical and health issues  that are contributing to anxiety including family situations and relationships.  In addition to that we will identify causes for anxiety and explore ways to lower it as well as improve his ability to manage anxiety symptoms and better handle stress.  Interventions will be to provide education about anxiety and stress to help him identify causes and symptoms.  Introduce coping skills and problem solution skills to manage anxiety including DBT distress tolerance and mindfulness skills.  We will also use cognitive behavioral therapy to identify and change anxiety provoking thought and behavior patterns. Progress: 35% Target date will be August 12, 2023 Lorrene CHRISTELLA Hasten, [facility]                  Lorrene CHRISTELLA Hasten, [facility]               Lorrene CHRISTELLA Hasten, [facility]               Lorrene CHRISTELLA Hasten, [facility]               Lorrene CHRISTELLA Hasten, [facility]               Lorrene CHRISTELLA Hasten, [facility]               Lorrene CHRISTELLA Hasten, [facility]               Lorrene CHRISTELLA Hasten, [facility]               Lorrene CHRISTELLA Hasten, [facility]               Lorrene CHRISTELLA Hasten, [facility]               Lorrene CHRISTELLA Hasten, [facility]               Lorrene CHRISTELLA Hasten, [facility]               Lorrene CHRISTELLA Hasten, [facility]

## 2023-08-19 ENCOUNTER — Ambulatory Visit: Admitting: Behavioral Health

## 2023-08-19 ENCOUNTER — Encounter: Payer: Self-pay | Admitting: Behavioral Health

## 2023-08-19 DIAGNOSIS — F411 Generalized anxiety disorder: Secondary | ICD-10-CM | POA: Diagnosis not present

## 2023-08-19 NOTE — Progress Notes (Addendum)
 [facility] Counselor/Therapist Progress Note  Patient ID: Larry Riley, MRN: [id number],    Date: 08/19/2023  Time Spent: 9 AM until 9:58 AM, 58 minutes.  This session was held via video teletherapy. The patient consented to the video teletherapy and was located in his home office during this session.  He is aware it is the responsibility of the patient to secure confidentiality on his end of the session. The provider was in a private home office for the duration of this session.      Treatment Type: Individual Therapy  Reported Symptoms: Anxiety/stress  Mental Status Exam: Appearance:  Casual     Behavior: Appropriate  Motor: Normal  Speech/Language:  Normal Rate  Affect: Appropriate  Mood: normal  Thought process: normal  Thought content:   WNL  Sensory/Perceptual disturbances:   WNL  Orientation: oriented to person, place, time/date, situation, day of week, and month of year  Attention: Good  Concentration: Good  Memory: WNL  Fund of knowledge:  Good  Insight:   Good  Judgment:  Good  Impulse Control: Good   Risk Assessment: Danger to Self:  No Self-injurious Behavior: No Danger to Others: No Duty to Warn:no Physical Aggression / Violence:No  Access to Firearms a concern: No  Gang Involvement:No   Subjective: There have been some positives for the patient.  He was able to find a company in [place]  that went in and cleaned out the apartment his father had been renting but could not live and anymore.  They were able to throw away things or give away things that the patient or his siblings did not want but also were sending him things which she says has value emotionally or otherwise to him.  That reduces his father paying for 2 places and is thankful for that to be off his plate.  The downside is his father now may have to have surgery which would mean he would have to going to rehab and may lose a spot in the assisted living facility that he is an so he is  trying to work through that.  He said the last couple weeks he has been on and off the phone trying to work that out it has been stressful.  He also has not been able to do as much personal self-care as he would like to because as work is interrupted during the day with his father's issues he has to make up that work at night.  He and his wife have talked about it and plan to try to go to [place]  sometime in September to physically do some things in regard to his father [place]  He feels like he can make that trip.  Other than that things are going fairly well.  He feels the work is going pretty well.  He had his doctor write a letter about being able to continue to work at home and he is waiting for the answer but he feels fairly confident that he will be able to continue to do that.  He is not able to drive yet and needs other accommodations based on the surgery and his medical conditions.  He does contract for safety having no thoughts of hurting himself or anyone else. Interventions: Cognitive Behavioral Therapy  Diagnosis: Generalized anxiety disorder  Plan: I will meet with the patient every 2 to 3 weeks via care agility.  Treatment plan: We will use cognitive behavioral therapy as well as dialectical  behavior therapy to help the patient reduce anxiety/stress primarily related to medical issues.  The goal is to reduce anxiety by 50% with a target date of February 12, 2023.  Goals for reducing anxiety include helping him manage thoughts and worrisome thinking contributing to feelings of anxiety as well as recognition of those thoughts.  We will look to resolve any core conflicts outside of the medical and health issues that are contributing to anxiety including family situations and relationships.  In addition to that we will identify causes for anxiety and explore ways to lower it as well as improve his ability to manage anxiety symptoms and better handle stress.   Interventions will be to provide education about anxiety and stress to help him identify causes and symptoms.  Introduce coping skills and problem solution skills to manage anxiety including DBT distress tolerance and mindfulness skills.  We will also use cognitive behavioral therapy to identify and change anxiety provoking thought and behavior patterns. Progress: 35% Target date will be January 31st, 2026 Lorrene CHRISTELLA Hasten, [facility]                  Lorrene CHRISTELLA Hasten, [facility]               Lorrene CHRISTELLA Hasten, [facility]               Lorrene CHRISTELLA Hasten, [facility]               Lorrene CHRISTELLA Hasten, [facility]               Lorrene CHRISTELLA Hasten, [facility]               Lorrene CHRISTELLA Hasten, [facility]               Lorrene CHRISTELLA Hasten, [facility]               Lorrene CHRISTELLA Hasten, [facility]               Lorrene CHRISTELLA Hasten, [facility]               Lorrene CHRISTELLA Hasten, [facility]               Lorrene CHRISTELLA Hasten, [facility]               Lorrene CHRISTELLA Hasten, [facility]               Lorrene CHRISTELLA Hasten, [facility]

## 2023-09-03 ENCOUNTER — Encounter: Payer: Self-pay | Admitting: Behavioral Health

## 2023-09-03 ENCOUNTER — Ambulatory Visit (INDEPENDENT_AMBULATORY_CARE_PROVIDER_SITE_OTHER): Admitting: Behavioral Health

## 2023-09-03 DIAGNOSIS — R454 Irritability and anger: Secondary | ICD-10-CM

## 2023-09-03 DIAGNOSIS — F411 Generalized anxiety disorder: Secondary | ICD-10-CM | POA: Diagnosis not present

## 2023-09-03 DIAGNOSIS — F331 Major depressive disorder, recurrent, moderate: Secondary | ICD-10-CM

## 2023-09-03 NOTE — Progress Notes (Signed)
 [facility] Counselor/Therapist Progress Note  Patient ID: Larry Riley, MRN: [id number],    Date: 09/03/2023  Time Spent: 9 AM until 9:58 AM, 58 minutes.  This session was held via video teletherapy. The patient consented to the video teletherapy and was located in his home office during this session.  He is aware it is the responsibility of the patient to secure confidentiality on his end of the session. The provider was in a private home office for the duration of this session.      Treatment Type: Individual Therapy  Reported Symptoms: Anxiety/stress  Mental Status Exam: Appearance:  Casual     Behavior: Appropriate  Motor: Normal  Speech/Language:  Normal Rate  Affect: Appropriate  Mood: normal  Thought process: normal  Thought content:   WNL  Sensory/Perceptual disturbances:   WNL  Orientation: oriented to person, place, time/date, situation, day of week, and month of year  Attention: Good  Concentration: Good  Memory: WNL  Fund of knowledge:  Good  Insight:   Good  Judgment:  Good  Impulse Control: Good   Risk Assessment: Danger to Self:  No Self-injurious Behavior: No Danger to Others: No Duty to Warn:no Physical Aggression / Violence:No  Access to Firearms a concern: No  Gang Involvement:No   Subjective: A lot of the patient's time over the past few months has been taken up with trying to get some of his father's things settled as well as his father.  The help of his social worker in the area where his father lives he was able to get him into an assisted living home which she said is one of the finest in the area and the patient reports every conversation he has had with that facility has been good and they have done all they can to make his father feel welcome.  He said his father still calls him regularly saying that he does not feel like it is a good place but cannot communicate why.  His father says he is struggling to communicate because of his  health issues the patient does believe he is in a very good place.  We talked about the effort he has put into make sure that his father is comfortable as well as taking care of all of his affairs especially doing it from 800 miles away.  We talked about excepting the fact that he knows he has made good effort and there is no evidence otherwise about the father's living situation.  The social worker says it is good and he also is having his sister and another friend of his father to go by and look at it also.  He acknowledges that between trying to take care of his father's issues and being very busy with work he has not put the effort into his health that he would like to and intends to get back to that.  Continues to make very good progress in his rehabilitation and encouraged him to stay with that as well as to continue to practice good self-care for both he and his wife say.  He did say that his wife had a difficult day earlier in the week in anticipation of possibly going up to visit his family in [place]  and he was able to calm the situation but would like to process that more in the next session.  He does contract for safety having no thoughts of hurting himself or anyone else. Interventions: Cognitive Behavioral Therapy  Diagnosis: Generalized anxiety disorder  Plan: I will meet with the patient every 2 to 3 weeks via care agility.  Treatment plan: We will use cognitive behavioral therapy as well as dialectical behavior therapy to help the patient reduce anxiety/stress primarily related to medical issues.  The goal is to reduce anxiety by 50% with a target date of February 12, 2023.  Goals for reducing anxiety include helping him manage thoughts and worrisome thinking contributing to feelings of anxiety as well as recognition of those thoughts.  We will look to resolve any core conflicts outside of the medical and health issues that are contributing to anxiety including family situations and  relationships.  In addition to that we will identify causes for anxiety and explore ways to lower it as well as improve his ability to manage anxiety symptoms and better handle stress.  Interventions will be to provide education about anxiety and stress to help him identify causes and symptoms.  Introduce coping skills and problem solution skills to manage anxiety including DBT distress tolerance and mindfulness skills.  We will also use cognitive behavioral therapy to identify and change anxiety provoking thought and behavior patterns. Progress: 35% Target date will be January 31st, 2026 Lorrene CHRISTELLA Hasten, [facility]                  Lorrene CHRISTELLA Hasten, [facility]               Lorrene CHRISTELLA Hasten, [facility]               Lorrene CHRISTELLA Hasten, [facility]               Lorrene CHRISTELLA Hasten, [facility]               Lorrene CHRISTELLA Hasten, [facility]               Lorrene CHRISTELLA Hasten, [facility]               Lorrene CHRISTELLA Hasten, [facility]               Lorrene CHRISTELLA Hasten, [facility]               Lorrene CHRISTELLA Hasten, [facility]               Lorrene CHRISTELLA Hasten, [facility]               Lorrene CHRISTELLA Hasten, [facility]               Lorrene CHRISTELLA Hasten, [facility]               Lorrene CHRISTELLA Hasten, [facility]               Lorrene CHRISTELLA Hasten, [facility]

## 2023-09-11 ENCOUNTER — Other Ambulatory Visit: Payer: Self-pay | Admitting: Internal Medicine

## 2023-09-11 DIAGNOSIS — G894 Chronic pain syndrome: Secondary | ICD-10-CM

## 2023-09-14 ENCOUNTER — Encounter: Payer: Self-pay | Admitting: Internal Medicine

## 2023-09-17 ENCOUNTER — Encounter: Payer: Self-pay | Admitting: Behavioral Health

## 2023-09-17 ENCOUNTER — Ambulatory Visit (INDEPENDENT_AMBULATORY_CARE_PROVIDER_SITE_OTHER): Admitting: Behavioral Health

## 2023-09-17 DIAGNOSIS — F411 Generalized anxiety disorder: Secondary | ICD-10-CM

## 2023-09-17 DIAGNOSIS — R454 Irritability and anger: Secondary | ICD-10-CM

## 2023-09-17 NOTE — Progress Notes (Signed)
  [facility] Counselor/Therapist Progress Note  Patient ID: Larry Riley, MRN: [id number],    Date: 09/17/2023  Time Spent: 12:02 PM until 1 PM, 58 minutes.  This session was held via video teletherapy. The patient consented to the video teletherapy and was located in his home office during this session.  He is aware it is the responsibility of the patient to secure confidentiality on his end of the session. The provider was in a private home office for the duration of this session.      Treatment Type: Individual Therapy  Reported Symptoms: Anxiety/stress  Mental Status Exam: Appearance:  Casual     Behavior: Appropriate  Motor: Normal  Speech/Language:  Normal Rate  Affect: Appropriate  Mood: normal  Thought process: normal  Thought content:   WNL  Sensory/Perceptual disturbances:   WNL  Orientation: oriented to person, place, time/date, situation, day of week, and month of year  Attention: Good  Concentration: Good  Memory: WNL  Fund of knowledge:  Good  Insight:   Good  Judgment:  Good  Impulse Control: Good   Risk Assessment: Danger to Self:  No Self-injurious Behavior: No Danger to Others: No Duty to Warn:no Physical Aggression / Violence:No  Access to Firearms a concern: No  Gang Involvement:No   Subjective: The patient did get approved for this week to be able to continue to work from home which is a huge relief for him.  Work appears to be going well.  He and his wife have worked out a trip to go to [place]  the first week of October for 1 week.  They will spend half of the week with his father and sister trying to work out some things for his father.  He spent some time over the phone over the past couple weeks trying to get some things in place people to meet with for service for his father.  He was for the second half of the week with his mother and brother and brother's family.  It would be a lot of driving and he knows it will be taxing on him  physically but he feels the need to go there.  There is some anxiety on his and his wife's part as there have been some uncomfortable situations with his wife and brother and mother.  They are staying at hotel so if his wife does not feel comfortable she can leave.  He is hoping they can at least start to plant the seats of healing in their time of there.  He will also feel better laying eyes on his father as he can see via phone conversations that his father's dementia has progressed.  He feels that physically he continues to progress.  He is able to do more around the house.  He is helping out more in the kitchen which he is thankful to be able to do.  He continues to do physical therapy at home.  He did say that he has a history of buying and selling [address]  His wife feels that it may have gotten a little out of control and he acknowledges that and the quiet downtime is such as early in the morning or late at night he does go to certain sites to look for these things and knows they do not have the money to spend about her right now.  He has sold some things some for a fair amount sell product large amount of money which she is thankful for but  has had time to so much lately.  We talked some about how to separate himself from going to look for those items and some of the cognitive work to put in to challenge some of those thoughts.  We talked about the pleasure that he gets from looking for buying and reselling some of his items but how he has to learn to manage that but we also looked at ways to distract himself from going to websites to carry the items that he might like to buy.  He does contract for safety having no thoughts of hurting himself or anyone else. Interventions: Cognitive Behavioral Therapy  Diagnosis: Generalized anxiety disorder  Plan: I will meet with the patient every 2 to 3 weeks via care agility.  Treatment plan: We will use cognitive behavioral therapy as well as  dialectical behavior therapy to help the patient reduce anxiety/stress primarily related to medical issues.  The goal is to reduce anxiety by 50% with a target date of February 12, 2023.  Goals for reducing anxiety include helping him manage thoughts and worrisome thinking contributing to feelings of anxiety as well as recognition of those thoughts.  We will look to resolve any core conflicts outside of the medical and health issues that are contributing to anxiety including family situations and relationships.  In addition to that we will identify causes for anxiety and explore ways to lower it as well as improve his ability to manage anxiety symptoms and better handle stress.  Interventions will be to provide education about anxiety and stress to help him identify causes and symptoms.  Introduce coping skills and problem solution skills to manage anxiety including DBT distress tolerance and mindfulness skills.  We will also use cognitive behavioral therapy to identify and change anxiety provoking thought and behavior patterns. Progress: 35% Target date will be January 31st, 2026 Lorrene CHRISTELLA Hasten, [facility]                  Lorrene CHRISTELLA Hasten, [facility]               Lorrene CHRISTELLA Hasten, [facility]               Lorrene CHRISTELLA Hasten, [facility]               Lorrene CHRISTELLA Hasten, [facility]               Lorrene CHRISTELLA Hasten, [facility]               Lorrene CHRISTELLA Hasten, [facility]               Lorrene CHRISTELLA Hasten, [facility]               Lorrene CHRISTELLA Hasten, [facility]               Lorrene CHRISTELLA Hasten, [facility]               Lorrene CHRISTELLA Hasten, [facility]               Lorrene CHRISTELLA Hasten, [facility]               Lorrene CHRISTELLA Hasten, [facility]               Lorrene CHRISTELLA Hasten, [facility]               Lorrene CHRISTELLA Hasten, [facility]               Lorrene CHRISTELLA Hasten, [facility]

## 2023-09-29 ENCOUNTER — Encounter: Payer: Self-pay | Admitting: Behavioral Health

## 2023-09-29 ENCOUNTER — Ambulatory Visit: Admitting: Behavioral Health

## 2023-09-29 DIAGNOSIS — F411 Generalized anxiety disorder: Secondary | ICD-10-CM

## 2023-09-29 DIAGNOSIS — R454 Irritability and anger: Secondary | ICD-10-CM

## 2023-09-29 NOTE — Progress Notes (Signed)
 [facility] Counselor/Therapist Progress Note  Patient ID: Larry Riley, MRN: [id number],    Date: 09/29/2023  Time Spent: 11:01 AM until 11:59 AM, 58 minutes.  This session was held via video teletherapy. The patient consented to the video teletherapy and was located in his home office during this session.  He is aware it is the responsibility of the patient to secure confidentiality on his end of the session. The provider was in a private home office for the duration of this session.      Treatment Type: Individual Therapy  Reported Symptoms: Anxiety/stress  Mental Status Exam: Appearance:  Casual     Behavior: Appropriate  Motor: Normal  Speech/Language:  Normal Rate  Affect: Appropriate  Mood: normal  Thought process: normal  Thought content:   WNL  Sensory/Perceptual disturbances:   WNL  Orientation: oriented to person, place, time/date, situation, day of week, and month of year  Attention: Good  Concentration: Good  Memory: WNL  Fund of knowledge:  Good  Insight:   Good  Judgment:  Good  Impulse Control: Good   Risk Assessment: Danger to Self:  No Self-injurious Behavior: No Danger to Others: No Duty to Warn:no Physical Aggression / Violence:No  Access to Firearms a concern: No  Gang Involvement:No   Subjective: The patient presented with brighter affect today.  He and his wife continue to prepare for the trip to [place]  to see his family and will be leaving in a couple of weeks.  There are still a lot of work to do in preparation for that but he feels like he is making good headway.  There has been some discord between his wife and brother and sister-in-law we talked about how they could best handle with each other and progress that I might be able to make in restoring those relationships.  Talked about ways that he can talk to his wife and encouraged her and her although difficult to take steps and reconciliation.  We talked about how he can handle  this trip physically although he is feeling much better and getting stronger daily.  He is feels that he has gotten some steps made in his father's care which will hopefully minimize how much he actually has to do when he is up there.  We also looked a little bit more at his history with neurofibroma and how that has affected him physically over the years but also the approach that he has taken to push past that.  He recognizes that he has had to work much harder than most people do getting things done but has chosen to maintain a very positive spirit and attitude. He does contract for safety having no thoughts of hurting himself or anyone else. Interventions: Cognitive Behavioral Therapy  Diagnosis: Generalized anxiety disorder  Plan: I will meet with the patient every 2 to 3 weeks via care agility.  Treatment plan: We will use cognitive behavioral therapy as well as dialectical behavior therapy to help the patient reduce anxiety/stress primarily related to medical issues.  The goal is to reduce anxiety by 50% with a target date of February 12, 2023.  Goals for reducing anxiety include helping him manage thoughts and worrisome thinking contributing to feelings of anxiety as well as recognition of those thoughts.  We will look to resolve any core conflicts outside of the medical and health issues that are contributing to anxiety including family situations and relationships.  In addition to that we will identify causes for anxiety and  explore ways to lower it as well as improve his ability to manage anxiety symptoms and better handle stress.  Interventions will be to provide education about anxiety and stress to help him identify causes and symptoms.  Introduce coping skills and problem solution skills to manage anxiety including DBT distress tolerance and mindfulness skills.  We will also use cognitive behavioral therapy to identify and change anxiety provoking thought and behavior patterns. Progress: 35%  Target date will be January 31st, 2026 Lorrene CHRISTELLA Hasten, [facility]                  Lorrene CHRISTELLA Hasten, [facility]               Lorrene CHRISTELLA Hasten, [facility]               Lorrene CHRISTELLA Hasten, [facility]               Lorrene CHRISTELLA Hasten, [facility]               Lorrene CHRISTELLA Hasten, [facility]               Lorrene CHRISTELLA Hasten, [facility]               Lorrene CHRISTELLA Hasten, [facility]               Lorrene CHRISTELLA Hasten, [facility]               Lorrene CHRISTELLA Hasten, [facility]               Lorrene CHRISTELLA Hasten, [facility]               Lorrene CHRISTELLA Hasten, [facility]               Lorrene CHRISTELLA Hasten, [facility]               Lorrene CHRISTELLA Hasten, [facility]               Lorrene CHRISTELLA Hasten, [facility]               Lorrene CHRISTELLA Hasten, [facility]               Lorrene CHRISTELLA Hasten, [facility]

## 2023-10-12 ENCOUNTER — Ambulatory Visit (INDEPENDENT_AMBULATORY_CARE_PROVIDER_SITE_OTHER): Admitting: Behavioral Health

## 2023-10-12 ENCOUNTER — Encounter: Payer: Self-pay | Admitting: Behavioral Health

## 2023-10-12 DIAGNOSIS — F411 Generalized anxiety disorder: Secondary | ICD-10-CM | POA: Diagnosis not present

## 2023-10-12 DIAGNOSIS — R454 Irritability and anger: Secondary | ICD-10-CM

## 2023-10-12 NOTE — Progress Notes (Signed)
 [facility] Counselor/Therapist Progress Note  Patient ID: Larry Riley, MRN: [id number],    Date: 10/12/2023  Time Spent: 10:02 AM until 10:59 AM, 57 minutes.  This session was held via video teletherapy. The patient consented to the video teletherapy and was located in his home office during this session.  He is aware it is the responsibility of the patient to secure confidentiality on his end of the session. The provider was in a outpatient therapy office for the duration of this session.      Treatment Type: Individual Therapy  Reported Symptoms: Anxiety/stress  Mental Status Exam: Appearance:  Casual     Behavior: Appropriate  Motor: Normal  Speech/Language:  Normal Rate  Affect: Appropriate  Mood: normal  Thought process: normal  Thought content:   WNL  Sensory/Perceptual disturbances:   WNL  Orientation: oriented to person, place, time/date, situation, day of week, and month of year  Attention: Good  Concentration: Good  Memory: WNL  Fund of knowledge:  Good  Insight:   Good  Judgment:  Good  Impulse Control: Good   Risk Assessment: Danger to Self:  No Self-injurious Behavior: No Danger to Others: No Duty to Warn:no Physical Aggression / Violence:No  Access to Firearms a concern: No  Gang Involvement:No   Subjective: The patient and his wife are almost ready to go to [place] .  They leave in 3 days and have some packing to do and picking up the rental car but other than that everything has been arranged.  Set amount of time to think spending with different parts of the family.  His father and his sister live in 1 town and his mother and brother live in another about 4 hours away so they are dividing their time.  A lot of things settle with his father but hopes to confirm a few things which will be better done in person.  He feels like his wife is in a better place but they have some contingency plans if there is any discord but he is hoping this trip  can start to plan some seeds of healing and reconciliation.  He also knows it would be tough as his wife has not driven this far in a long time and he is not able to help drive.  They will be taking one of their dogs which is a good emotional support for them.  Encouraged him to use them to share with his wife some of the coping skills that we have worked on for reducing anxiety and stress.  He does contract for safety having no thoughts of hurting himself or anyone else. Interventions: Cognitive Behavioral Therapy  Diagnosis: Generalized anxiety disorder  Plan: I will meet with the patient every 2 to 3 weeks via care agility.  Treatment plan: We will use cognitive behavioral therapy as well as dialectical behavior therapy to help the patient reduce anxiety/stress primarily related to medical issues.  The goal is to reduce anxiety by 50% with a target date of February 12, 2023.  Goals for reducing anxiety include helping him manage thoughts and worrisome thinking contributing to feelings of anxiety as well as recognition of those thoughts.  We will look to resolve any core conflicts outside of the medical and health issues that are contributing to anxiety including family situations and relationships.  In addition to that we will identify causes for anxiety and explore ways to lower it as well as improve his ability to manage anxiety symptoms and better handle stress.  Interventions will be to provide education about anxiety and stress to help him identify causes and symptoms.  Introduce coping skills and problem solution skills to manage anxiety including DBT distress tolerance and mindfulness skills.  We will also use cognitive behavioral therapy to identify and change anxiety provoking thought and behavior patterns. Progress: 40% Target date will be January 31st, 2026 Lorrene CHRISTELLA Hasten, [facility]                  Lorrene CHRISTELLA Hasten, [facility]               Lorrene CHRISTELLA Hasten,  [facility]               Lorrene CHRISTELLA Hasten, [facility]               Lorrene CHRISTELLA Hasten, [facility]               Lorrene CHRISTELLA Hasten, [facility]               Lorrene CHRISTELLA Hasten, [facility]               Lorrene CHRISTELLA Hasten, [facility]               Lorrene CHRISTELLA Hasten, [facility]               Lorrene CHRISTELLA Hasten, [facility]               Lorrene CHRISTELLA Hasten, [facility]               Lorrene CHRISTELLA Hasten, [facility]               Lorrene CHRISTELLA Hasten, [facility]               Lorrene CHRISTELLA Hasten, [facility]               Lorrene CHRISTELLA Hasten, [facility]               Lorrene CHRISTELLA Hasten, [facility]               Lorrene CHRISTELLA Hasten, [facility]               Lorrene CHRISTELLA Hasten, [facility]

## 2023-11-02 ENCOUNTER — Encounter: Payer: Self-pay | Admitting: Internal Medicine

## 2023-11-02 ENCOUNTER — Ambulatory Visit: Admitting: Internal Medicine

## 2023-11-02 VITALS — BP 124/76 | HR 80 | Temp 98.9°F | Ht 64.0 in | Wt 222.0 lb

## 2023-11-02 DIAGNOSIS — Z0000 Encounter for general adult medical examination without abnormal findings: Secondary | ICD-10-CM

## 2023-11-02 DIAGNOSIS — K5900 Constipation, unspecified: Secondary | ICD-10-CM | POA: Insufficient documentation

## 2023-11-02 DIAGNOSIS — R739 Hyperglycemia, unspecified: Secondary | ICD-10-CM | POA: Diagnosis not present

## 2023-11-02 DIAGNOSIS — G894 Chronic pain syndrome: Secondary | ICD-10-CM | POA: Diagnosis not present

## 2023-11-02 DIAGNOSIS — K625 Hemorrhage of anus and rectum: Secondary | ICD-10-CM | POA: Insufficient documentation

## 2023-11-02 DIAGNOSIS — R141 Gas pain: Secondary | ICD-10-CM | POA: Insufficient documentation

## 2023-11-02 DIAGNOSIS — K219 Gastro-esophageal reflux disease without esophagitis: Secondary | ICD-10-CM | POA: Diagnosis not present

## 2023-11-02 DIAGNOSIS — R12 Heartburn: Secondary | ICD-10-CM | POA: Insufficient documentation

## 2023-11-02 DIAGNOSIS — R140 Abdominal distension (gaseous): Secondary | ICD-10-CM | POA: Insufficient documentation

## 2023-11-02 DIAGNOSIS — K6289 Other specified diseases of anus and rectum: Secondary | ICD-10-CM | POA: Insufficient documentation

## 2023-11-02 DIAGNOSIS — L918 Other hypertrophic disorders of the skin: Secondary | ICD-10-CM

## 2023-11-02 MED ORDER — PANTOPRAZOLE SODIUM 40 MG PO TBEC
DELAYED_RELEASE_TABLET | ORAL | Status: AC
Start: 2023-11-02 — End: ?

## 2023-11-02 NOTE — Addendum Note (Signed)
 Addended by: NORLEEN LYNWOOD ORN on: 11/02/2023 12:47 PM   Modules accepted: Orders

## 2023-11-02 NOTE — Assessment & Plan Note (Signed)

## 2023-11-02 NOTE — Assessment & Plan Note (Signed)
 Chronic stable on tramadol  with recent less dosing need, and gabapentin  600 mg which he feels may be causing sexual side effect and plans to aks about reduced gabapentin  with his surgeon

## 2023-11-02 NOTE — Assessment & Plan Note (Addendum)
 Chronic stable, cont PPI

## 2023-11-02 NOTE — Assessment & Plan Note (Signed)
 Also for dermatology referral per pt reqeust

## 2023-11-02 NOTE — Progress Notes (Addendum)
 Patient ID: Larry Riley, male   DOB: 10-Sep-1980, 43 y.o.   MRN: [id number]         Chief Complaint:: wellness exam and chronic pain, hyperglycemia       HPI:  Larry Riley is a 43 y.o. male here for wellness exam; declines hep c and hiv screen, o/w up to date                        Also now using cane only (no further walker except on unlevel ground) now more than 1 yr out from c spine surgury April 2024.  No falls. . Had recent labs with tot chol 156, sugar normal and no other abnormalities.  Pt reports recent MRI scans stable and  no further surgury planned anytime soon.  To f/u in  yr with neurosurgury  Has gained wt with less active but now plans to be more active.  Does have very occasional urinary incont while sleeping  Did have to have linzess increased to 145 mcg per day for bowel program.  Has stable reflux on PPI.  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    Wt Readings from Last 3 Encounters:  11/02/23 222 lb (100.7 kg)  10/21/22 200 lb (90.7 kg)  07/02/22 185 lb (83.9 kg)   BP Readings from Last 3 Encounters:  11/02/23 124/76  03/11/23 120/80  03/04/23 (!) 81/55   Immunization History  Administered Date(s) Administered   Influenza Inj Mdck Quad Pf 10/14/2018   Influenza,inj,Quad PF,6+ Mos 11/28/2012, 10/03/2016, 10/10/2023   Influenza-Unspecified 11/28/2012, 12/14/2013, 10/03/2016, 10/26/2016   PFIZER Comirnaty(Gray Top)Covid-19 Tri-Sucrose Vaccine 06/26/2019, 07/17/2019   PFIZER(Purple Top)SARS-COV-2 Vaccination 06/23/2019, 07/13/2019   PPD Test 05/02/2022, 05/10/2022   Tdap 12/11/2011   Health Maintenance Due  Topic Date Due   DTaP/Tdap/Td (2 - Td or Tdap) 12/10/2021      Past Medical History:  Diagnosis Date   Anxiety    Depression    GERD (gastroesophageal reflux disease) 01/21/2017   MRSA (methicillin resistant Staphylococcus aureus) 2005   leg    Neurofibromatosis    tumor down spine and brain   Past Surgical History:  Procedure Laterality Date   hydrocephalus     shunt 1999-[place]    NECK SURGERY      tumor removal   SPINE SURGERY  10/2013   c2-t2 fusion  , laminectomy c1-6-- Dr MICHIEL-- [facility]   VASECTOMY  02/2013    reports that he has never smoked. He has never used smokeless tobacco. He reports current alcohol use of about 3.0 standard drinks of alcohol per week. He reports that he does not use drugs. family history includes Arthritis in his father, paternal grandfather, and paternal grandmother; Diabetes in his mother and paternal grandfather; Healthy in his maternal grandfather and maternal grandmother; Hyperlipidemia in his paternal grandfather; Hypertension in his father and paternal grandfather; Prostate cancer in his father and another family member. No Known Allergies Current Outpatient Medications on File Prior to Visit  Medication Sig Dispense Refill   acetaminophen  (TYLENOL ) 325 MG tablet Take 650 mg by mouth every 6 (six) hours as needed for mild pain, moderate pain, fever or headache.      bisacodyl (DULCOLAX) 5 MG EC tablet Take 5 mg by mouth daily as needed for mild constipation.      celecoxib  (CELEBREX ) 200 MG capsule TAKE 1 CAPSULE(200  MG) BY MOUTH TWICE DAILY 180 capsule 2   diphenhydrAMINE (BENADRYL) 25 mg capsule Take 25 mg by mouth at bedtime as needed for allergies or sleep.     gabapentin  (NEURONTIN ) 600 MG tablet Take 1 tablet (600 mg total) by mouth 3 (three) times daily. 270 tablet 1   guaiFENesin (MUCINEX) 600 MG 12 hr tablet Take 600 mg by mouth 2 (two) times daily as needed for cough or to loosen phlegm.     LINZESS 145 MCG CAPS capsule Take 145 mcg by mouth every morning.     sildenafil  (REVATIO ) 20 MG tablet TAKE 1 TO 5 TABLETS BY MOUTH DAILY AS NEEDED 50 tablet 5   tamsulosin  (FLOMAX ) 0.4 MG CAPS capsule Take 1 capsule (0.4 mg total) by mouth daily after supper. Annual appt due in July must see  provider for future refills 30 capsule 0   tiZANidine  (ZANAFLEX ) 4 MG tablet Take 1 tablet (4 mg total) by mouth every 6 (six) hours as needed for muscle spasms. 120 tablet 3   traMADol  (ULTRAM ) 50 MG tablet Take 1 tablet (50 mg total) by mouth 3 (three) times daily as needed. 90 tablet 2   No current facility-administered medications on file prior to visit.        ROS:  All others reviewed and negative.  Objective        PE:  BP 124/76 (BP Location: Right Arm, Patient Position: Sitting, Cuff Size: Normal)   Pulse 80   Temp 98.9 F (37.2 C) (Oral)   Ht 5' 4 (1.626 m)   Wt 222 lb (100.7 kg)   SpO2 96%   BMI 38.11 kg/m                 Constitutional: Pt appears in NAD               HENT: Head: NCAT.                Right Ear: External ear normal.                 Left Ear: External ear normal.                Eyes: . Pupils are equal, round, and reactive to light. Conjunctivae and EOM are normal               Nose: without d/c or deformity               Neck: Neck supple. Gross normal ROM               Cardiovascular: Normal rate and regular rhythm.                 Pulmonary/Chest: Effort normal and breath sounds without rales or wheezing.                Abd:  Soft, NT, ND, + BS, no organomegaly               Neurological: Pt is alert. At baseline orientation               Skin: Skin is warm. No rashes, no other new lesions, LE edema - none               Psychiatric: Pt behavior is normal without agitation   Micro: none  Cardiac tracings I have personally interpreted today:  none  Pertinent Radiological findings (summarize): none   Lab Results  Component Value Date  WBC 7.4 08/19/2021   HGB 14.0 08/19/2021   HCT 41.6 08/19/2021   PLT 407 08/19/2021   GLUCOSE 88 08/19/2021   CHOL 152 08/19/2021   TRIG 202 (H) 08/19/2021   HDL 49 08/19/2021   LDLCALC 70 08/19/2021   ALT 36 08/19/2021   AST 28 08/19/2021   NA 139 08/19/2021   K 4.5 08/19/2021   CL 106 08/19/2021    CREATININE 0.81 08/19/2021   BUN 23 08/19/2021   CO2 21 08/19/2021   TSH 1.440 08/19/2021   HGBA1C 5.5 08/19/2021   Assessment/Plan:  Larry Riley is a 43 y.o. White or Caucasian [1] male with  has a past medical history of Anxiety, Depression, GERD (gastroesophageal reflux disease) (01/21/2017), MRSA (methicillin resistant Staphylococcus aureus) (2005), and Neurofibromatosis.  Preventative health care Age and sex appropriate education and counseling updated with regular exercise and diet Referrals for preventative services - none needed Immunizations addressed - for tdap at pharmacy Smoking counseling  - none needed Evidence for depression or other mood disorder - none significant Most recent labs reviewed. I have personally reviewed and have noted: 1) the patient's medical and social history 2) The patient's current medications and supplements 3) The patient's height, weight, and BMI have been recorded in the chart   Chronic pain syndrome Chronic stable on tramadol  with recent less dosing need, and gabapentin  600 mg which he feels may be causing sexual side effect and plans to aks about reduced gabapentin  with his surgeon  Hyperglycemia Lab Results  Component Value Date   HGBA1C 5.5 08/19/2021   Stable, pt to continue current medical treatment  - diet, wt control, and pt reports very recent sugar normal as well - will send results on mychart   Gastroesophageal reflux disease Chronic stable, cont PPI   Skin tag Also for dermatology referral per pt reqeust  Followup: Return in about 1 year (around 11/01/2024).  Larry Rush, MD 11/02/2023 12:47 PM [facility]

## 2023-11-02 NOTE — Assessment & Plan Note (Signed)
 Lab Results  Component Value Date   HGBA1C 5.5 08/19/2021   Stable, pt to continue current medical treatment  - diet, wt control, and pt reports very recent sugar normal as well - will send results on mychart

## 2023-11-02 NOTE — Patient Instructions (Addendum)
 Please consider Tdap tetanus shot at the pharmacy  Please continue all other medications as before, and refills have been done if requested.  Please have the pharmacy call with any other refills you may need.  Please continue your efforts at being more active, low cholesterol diet, and weight control.  You are otherwise up to date with prevention measures today.  Please keep your appointments with your specialists as you may have planned  Please make an Appointment to return for your 1 year visit, or sooner if needed, with Lab testing by Appointment as well, to be done about 3-5 days before at the FIRST FLOOR Lab (so this is for TWO appointments - please see the scheduling desk as you leave)

## 2023-11-05 ENCOUNTER — Encounter: Payer: Self-pay | Admitting: Behavioral Health

## 2023-11-05 ENCOUNTER — Encounter: Payer: Self-pay | Admitting: Physical Therapy

## 2023-11-11 ENCOUNTER — Ambulatory Visit (INDEPENDENT_AMBULATORY_CARE_PROVIDER_SITE_OTHER): Admitting: Behavioral Health

## 2023-11-11 ENCOUNTER — Encounter: Payer: Self-pay | Admitting: Behavioral Health

## 2023-11-11 DIAGNOSIS — F411 Generalized anxiety disorder: Secondary | ICD-10-CM

## 2023-11-11 DIAGNOSIS — R454 Irritability and anger: Secondary | ICD-10-CM

## 2023-11-11 DIAGNOSIS — F331 Major depressive disorder, recurrent, moderate: Secondary | ICD-10-CM

## 2023-11-11 NOTE — Progress Notes (Signed)
 [facility] Counselor/Therapist Progress Note  Patient ID: Larry Riley, MRN: [id number],    Date: 11/11/2023  Time Spent: 10:02 AM until 10:59 AM, 57 minutes.  This session was held via video teletherapy. The patient consented to the video teletherapy and was located in his home office during this session.  He is aware it is the responsibility of the patient to secure confidentiality on his end of the session. The provider was in a outpatient therapy office for the duration of this session.      Treatment Type: Individual Therapy  Reported Symptoms: Anxiety/stress  Mental Status Exam: Appearance:  Casual     Behavior: Appropriate  Motor: Normal  Speech/Language:  Normal Rate  Affect: Appropriate  Mood: normal  Thought process: normal  Thought content:   WNL  Sensory/Perceptual disturbances:   WNL  Orientation: oriented to person, place, time/date, situation, day of week, and month of year  Attention: Good  Concentration: Good  Memory: WNL  Fund of knowledge:  Good  Insight:   Good  Judgment:  Good  Impulse Control: Good   Risk Assessment: Danger to Self:  No Self-injurious Behavior: No Danger to Others: No Duty to Warn:no Physical Aggression / Violence:No  Access to Firearms a concern: No  Gang Involvement:No   Subjective: The patient reported that the trip to [place]  it went better than he expected.  There was still a lot of things he did not get done but was able to get some things out of the storage unit to take to his father's current group home/assisted living facility.  He was able to meet with both his brother and sister and is optimistic that there will be some improvement in those relationships.  He is sister will help some with that but is busy with 3 children as a single mom.  Because of something that his brother shared with him while he was in [place]  his brother wants nothing to do with his father right now.  The patient tried to encourage  his brother telling him that the father's dementia is progressing and if he is going to make amends he needs to consider doing that now.  He says that his brother and his wife did talk a little bit too and he is optimistic that relationship will improve but his wife and his sister-in-law need to talk to clear help some things from years ago.  He felt like he and his wife both physically held up fairly well for the trip to [place] .  They came home to find that there 88 year old dog was not doing well.  They noticed some changes in his behavior but after 2 to 3 weeks of being home they knew that there was not a good quality of life as they did make a decision to put him to sleep.  It was his wife's dog before they met but it was family to them and this has been a very difficult transition even knowing how old he is.  We talked about creating even for heart pads.  Work is still going well but he is in a busy time of the year and he is putting extra hours in.  He does not mind but he is trying to balance out and still doing some things from his father from here which are somewhat challenging.  He has not been able to put as much time in his self-care as he would like to and I encouraged him to do as much as he  can and the windows of opportunity that he has presented but also be intentional about some of the patient has relaxation breathing and grounding exercises.  He does contract for safety having no thoughts of hurting himself or anyone else. Interventions: Cognitive Behavioral Therapy  Diagnosis: Generalized anxiety disorder  Plan: I will meet with the patient every 2 to 3 weeks via care agility.  Treatment plan: We will use cognitive behavioral therapy as well as dialectical behavior therapy to help the patient reduce anxiety/stress primarily related to medical issues.  The goal is to reduce anxiety by 50% with a target date of February 12, 2023.  Goals for reducing anxiety include helping him manage  thoughts and worrisome thinking contributing to feelings of anxiety as well as recognition of those thoughts.  We will look to resolve any core conflicts outside of the medical and health issues that are contributing to anxiety including family situations and relationships.  In addition to that we will identify causes for anxiety and explore ways to lower it as well as improve his ability to manage anxiety symptoms and better handle stress.  Interventions will be to provide education about anxiety and stress to help him identify causes and symptoms.  Introduce coping skills and problem solution skills to manage anxiety including DBT distress tolerance and mindfulness skills.  We will also use cognitive behavioral therapy to identify and change anxiety provoking thought and behavior patterns. Progress: 40% Target date will be January 31st, 2026 Lorrene CHRISTELLA Hasten, [facility]                  Lorrene CHRISTELLA Hasten, [facility]               Lorrene CHRISTELLA Hasten, [facility]               Lorrene CHRISTELLA Hasten, [facility]               Lorrene CHRISTELLA Hasten, [facility]               Lorrene CHRISTELLA Hasten, [facility]               Lorrene CHRISTELLA Hasten, [facility]               Lorrene CHRISTELLA Hasten, [facility]               Lorrene CHRISTELLA Hasten, [facility]               Lorrene CHRISTELLA Hasten, [facility]               Lorrene CHRISTELLA Hasten, [facility]               Lorrene CHRISTELLA Hasten, [facility]               Lorrene CHRISTELLA Hasten, [facility]               Lorrene CHRISTELLA Hasten, [facility]               Lorrene CHRISTELLA Hasten, [facility]               Lorrene CHRISTELLA Hasten, [facility]               Lorrene CHRISTELLA Hasten, [facility]               Lorrene CHRISTELLA Hasten, [facility]               Lorrene CHRISTELLA Hasten, [facility]

## 2023-12-03 ENCOUNTER — Encounter: Payer: Self-pay | Admitting: Behavioral Health

## 2023-12-03 ENCOUNTER — Ambulatory Visit (INDEPENDENT_AMBULATORY_CARE_PROVIDER_SITE_OTHER): Admitting: Behavioral Health

## 2023-12-03 DIAGNOSIS — F411 Generalized anxiety disorder: Secondary | ICD-10-CM

## 2023-12-03 NOTE — Progress Notes (Signed)
 [facility] Counselor/Therapist Progress Note  Patient ID: Larry Riley, MRN: [id number],    Date: 12/03/2023  Time Spent: 202 until 2:40 PM, 38 minutes.  This session was held via video teletherapy. The patient consented to the video teletherapy and was located in his home office during this session.  He is aware it is the responsibility of the patient to secure confidentiality on his end of the session. The provider was in a outpatient therapy office for the duration of this session.      Treatment Type: Individual Therapy  Reported Symptoms: Anxiety/stress  Mental Status Exam: Appearance:  Casual     Behavior: Appropriate  Motor: Normal  Speech/Language:  Normal Rate  Affect: Appropriate  Mood: normal  Thought process: normal  Thought content:   WNL  Sensory/Perceptual disturbances:   WNL  Orientation: oriented to person, place, time/date, situation, day of week, and month of year  Attention: Good  Concentration: Good  Memory: WNL  Fund of knowledge:  Good  Insight:   Good  Judgment:  Good  Impulse Control: Good   Risk Assessment: Danger to Self:  No Self-injurious Behavior: No Danger to Others: No Duty to Warn:no Physical Aggression / Violence:No  Access to Firearms a concern: No  Gang Involvement:No   Subjective: The patient was tired saying it has been a busy week.  He has been trying to get some things lined up for his father.  Work has been extremely busy.  They had to have a furnace/air conditioner installed in her house this week.  They are thankful but that created a lot of extra work around the house this week.  For the most part he feels that he is handling his stress well.  He did say that because of all this: On this year he has not had a lot of time to invest in self-care so we talked about and I encouraged him to make a list of things that he could do smaller otherwise to be more intentional about self-care moving forward through the end of  the year and into next year.  He does contract for safety having no thoughts of hurting himself or anyone else. Interventions: Cognitive Behavioral Therapy  Diagnosis: Generalized anxiety disorder  Plan: I will meet with the patient every 2 to 3 weeks via care agility.  Treatment plan: We will use cognitive behavioral therapy as well as dialectical behavior therapy to help the patient reduce anxiety/stress primarily related to medical issues.  The goal is to reduce anxiety by 50% with a target date of February 12, 2023.  Goals for reducing anxiety include helping him manage thoughts and worrisome thinking contributing to feelings of anxiety as well as recognition of those thoughts.  We will look to resolve any core conflicts outside of the medical and health issues that are contributing to anxiety including family situations and relationships.  In addition to that we will identify causes for anxiety and explore ways to lower it as well as improve his ability to manage anxiety symptoms and better handle stress.  Interventions will be to provide education about anxiety and stress to help him identify causes and symptoms.  Introduce coping skills and problem solution skills to manage anxiety including DBT distress tolerance and mindfulness skills.  We will also use cognitive behavioral therapy to identify and change anxiety provoking thought and behavior patterns. Progress: 40% Target date will be January 31st, 2026 Lorrene CHRISTELLA Hasten, [facility]  Lorrene CHRISTELLA Hasten, [facility]               Lorrene CHRISTELLA Hasten, [facility]               Lorrene CHRISTELLA Hasten, [facility]               Lorrene CHRISTELLA Hasten, [facility]               Lorrene CHRISTELLA Hasten, [facility]               Lorrene CHRISTELLA Hasten, [facility]               Lorrene CHRISTELLA Hasten, [facility]               Lorrene CHRISTELLA Hasten, [facility]               Lorrene CHRISTELLA Hasten,  [facility]               Lorrene CHRISTELLA Hasten, [facility]               Lorrene CHRISTELLA Hasten, [facility]               Lorrene CHRISTELLA Hasten, [facility]               Lorrene CHRISTELLA Hasten, [facility]               Lorrene CHRISTELLA Hasten, [facility]               Lorrene CHRISTELLA Hasten, [facility]               Lorrene CHRISTELLA Hasten, [facility]               Lorrene CHRISTELLA Hasten, [facility]               Lorrene CHRISTELLA Hasten, [facility]               Lorrene CHRISTELLA Hasten, [facility]

## 2023-12-15 ENCOUNTER — Other Ambulatory Visit: Payer: Self-pay | Admitting: Internal Medicine

## 2023-12-15 ENCOUNTER — Encounter: Payer: Self-pay | Admitting: Internal Medicine

## 2023-12-15 DIAGNOSIS — G894 Chronic pain syndrome: Secondary | ICD-10-CM

## 2023-12-15 MED ORDER — TRAMADOL HCL 50 MG PO TABS: 50.0000 mg | ORAL_TABLET | Freq: Three times a day (TID) | ORAL | 5 refills | Status: AC | PRN

## 2023-12-31 ENCOUNTER — Ambulatory Visit: Admitting: Behavioral Health

## 2023-12-31 ENCOUNTER — Encounter: Payer: Self-pay | Admitting: Behavioral Health

## 2023-12-31 DIAGNOSIS — F411 Generalized anxiety disorder: Secondary | ICD-10-CM

## 2023-12-31 NOTE — Progress Notes (Signed)
 "  [facility] Counselor/Therapist Progress Note  Patient ID: Larry Riley, MRN: [id number],    Date: 12/31/2023  Time Spent: 10:02 AM until 11 AM, 58 minutes.  This session was held via video teletherapy. The patient consented to the video teletherapy and was located in his home office during this session.  He is aware it is the responsibility of the patient to secure confidentiality on his end of the session. The provider was in a outpatient therapy office for the duration of this session.      Treatment Type: Individual Therapy  Reported Symptoms: Anxiety/stress  Mental Status Exam: Appearance:  Casual     Behavior: Appropriate  Motor: Normal  Speech/Language:  Normal Rate  Affect: Appropriate  Mood: normal  Thought process: normal  Thought content:   WNL  Sensory/Perceptual disturbances:   WNL  Orientation: oriented to person, place, time/date, situation, day of week, and month of year  Attention: Good  Concentration: Good  Memory: WNL  Fund of knowledge:  Good  Insight:   Good  Judgment:  Good  Impulse Control: Good   Risk Assessment: Danger to Self:  No Self-injurious Behavior: No Danger to Others: No Duty to Warn:no Physical Aggression / Violence:No  Access to Firearms a concern: No  Gang Involvement:No   Subjective: For the most part things had been stable until the past few days when he received a call that his father had fallen.  Previous health issues and being on blood thinners his caregivers called 911 but he was refusing to go.  The patient said they had to call him in the middle of the day to talk to his dad and it took him a long time to convince him that he needed to be checked out.  Thankfully for the most part his father was okay but because he had a stroke in the past they needed to err to the side of caution.  He is meeting with caregivers this afternoon virtually to see what their recommendations are in terms of his level of care.  He is  seeing and some cognitive decline which is hard for him to watch but his father is also becoming more resistant to care and that is making his job especially living states away to care for his father.  There has not been much reunification effort on his brother and sisters part to reconnect with her father therefore he does not get much help and how he cares for his father.  They are also caring for his wife's mother so they have a fair amount of responsibility.  He is thankful that he has some flexibility in his job and then he can work later if he needs some time during the but it also takes a lot of his physical mental and emotional energy.  He appears to be using his coping skills as well as possible and as timely as possible to minimize his stress He does contract for safety having no thoughts of hurting himself or anyone else. Interventions: Cognitive Behavioral Therapy  Diagnosis: Generalized anxiety disorder  Plan: I will meet with the patient every 2 to 3 weeks via care agility.  Treatment plan: We will use cognitive behavioral therapy as well as dialectical behavior therapy to help the patient reduce anxiety/stress primarily related to medical issues.  The goal is to reduce anxiety by 50% with a target date of February 12, 2023.  Goals for reducing anxiety include helping him manage thoughts and worrisome thinking contributing  to feelings of anxiety as well as recognition of those thoughts.  We will look to resolve any core conflicts outside of the medical and health issues that are contributing to anxiety including family situations and relationships.  In addition to that we will identify causes for anxiety and explore ways to lower it as well as improve his ability to manage anxiety symptoms and better handle stress.  Interventions will be to provide education about anxiety and stress to help him identify causes and symptoms.  Introduce coping skills and problem solution skills to manage anxiety  including DBT distress tolerance and mindfulness skills.  We will also use cognitive behavioral therapy to identify and change anxiety provoking thought and behavior patterns. Progress: 40% Target date will be January 31st, 2026 Lorrene CHRISTELLA Hasten, [facility]                  Lorrene CHRISTELLA Hasten, [facility]               Lorrene CHRISTELLA Hasten, [facility]               Lorrene CHRISTELLA Hasten, [facility]               Lorrene CHRISTELLA Hasten, [facility]               Lorrene CHRISTELLA Hasten, [facility]               Lorrene CHRISTELLA Hasten, [facility]               Lorrene CHRISTELLA Hasten, [facility]               Lorrene CHRISTELLA Hasten, [facility]               Lorrene CHRISTELLA Hasten, [facility]               Lorrene CHRISTELLA Hasten, [facility]               Lorrene CHRISTELLA Hasten, [facility]               Lorrene CHRISTELLA Hasten, [facility]               Lorrene CHRISTELLA Hasten, [facility]               Lorrene CHRISTELLA Hasten, [facility]               Lorrene CHRISTELLA Hasten, [facility]               Lorrene CHRISTELLA Hasten, [facility]               Lorrene CHRISTELLA Hasten, [facility]               Lorrene CHRISTELLA Hasten, [facility]               Lorrene CHRISTELLA Hasten, [facility]               Lorrene CHRISTELLA Hasten, [facility] "

## 2024-01-10 MED ORDER — GABAPENTIN 400 MG PO CAPS: 400.0000 mg | ORAL_CAPSULE | Freq: Three times a day (TID) | ORAL | 5 refills | Status: AC

## 2024-01-10 NOTE — Addendum Note (Signed)
 Addended by: NORLEEN LYNWOOD ORN on: 01/10/2024 06:53 PM   Modules accepted: Orders

## 2024-02-04 ENCOUNTER — Ambulatory Visit: Payer: Self-pay | Admitting: Behavioral Health

## 2024-02-04 ENCOUNTER — Encounter: Payer: Self-pay | Admitting: Behavioral Health

## 2024-02-04 DIAGNOSIS — F411 Generalized anxiety disorder: Secondary | ICD-10-CM

## 2024-02-04 NOTE — Progress Notes (Signed)
 "  [facility] Counselor/Therapist Progress Note  Patient ID: Larry Riley, MRN: [id number],    Date: 02/04/2024  Time Spent: 10:02 AM until 10:58 AM, 56 minutes.  This session was held via video teletherapy. The patient consented to the video teletherapy and was located in his home office during this session.  He is aware it is the responsibility of the patient to secure confidentiality on his end of the session. The provider was in a outpatient therapy office for the duration of this session.      Treatment Type: Individual Therapy  Reported Symptoms: Anxiety/stress  Mental Status Exam: Appearance:  Casual     Behavior: Appropriate  Motor: Normal  Speech/Language:  Normal Rate  Affect: Appropriate  Mood: normal  Thought process: normal  Thought content:   WNL  Sensory/Perceptual disturbances:   WNL  Orientation: oriented to person, place, time/date, situation, day of week, and month of year  Attention: Good  Concentration: Good  Memory: WNL  Fund of knowledge:  Good  Insight:   Good  Judgment:  Good  Impulse Control: Good   Risk Assessment: Danger to Self:  No Self-injurious Behavior: No Danger to Others: No Duty to Warn:no Physical Aggression / Violence:No  Access to Firearms a concern: No  Gang Involvement:No   Subjective: The patient is calm when he allows a certain amount of dollars for improvements in health including workout equipment so he and his wife bought some step ups as well as some weights and have been using them consistently for about a week.  He said he can already start to feel a difference in his strength and his balance.  The support of something he and his wife have resolved to do.  He feels more stability in his father's situation.  His mother is having some health issues but he realizes he can do minimal amount about that and is encouraging self-care.  He is trying to gradually reach out to his brother's sister to continue to try to  improve the relationships but he also recognizes that they have to be a part of that also.  Things are going well at work and he is very busy.  There may be some opportunities in the near future for advancement opportunities.  He knows that this year he can hopefully focus on taking care of himself instead of fighting fires like he had to do a lot with his father's health and other situations last year.  He says he feels he is managing his anxiety well.  He does contract for safety having no thoughts of hurting himself or anyone else. Interventions: Cognitive Behavioral Therapy  Diagnosis: Generalized anxiety disorder  Plan: I will meet with the patient every 2 to 3 weeks via care agility.  Treatment plan: We will use cognitive behavioral therapy as well as dialectical behavior therapy to help the patient reduce anxiety/stress primarily related to medical issues.  The goal is to reduce anxiety by 50% with a target date of February 12, 2023.  Goals for reducing anxiety include helping him manage thoughts and worrisome thinking contributing to feelings of anxiety as well as recognition of those thoughts.  We will look to resolve any core conflicts outside of the medical and health issues that are contributing to anxiety including family situations and relationships.  In addition to that we will identify causes for anxiety and explore ways to lower it as well as improve his ability to manage anxiety symptoms and better handle stress.  Interventions will be to provide education about anxiety and stress to help him identify causes and symptoms.  Introduce coping skills and problem solution skills to manage anxiety including DBT distress tolerance and mindfulness skills.  We will also use cognitive behavioral therapy to identify and change anxiety provoking thought and behavior patterns. Progress: 40% Target date will be January 31st, 2026 Lorrene CHRISTELLA Hasten, [facility]                  Lorrene CHRISTELLA Hasten,  [facility]               Lorrene CHRISTELLA Hasten, [facility]               Lorrene CHRISTELLA Hasten, [facility]               Lorrene CHRISTELLA Hasten, [facility]               Lorrene CHRISTELLA Hasten, [facility]               Lorrene CHRISTELLA Hasten, [facility]               Lorrene CHRISTELLA Hasten, [facility]               Lorrene CHRISTELLA Hasten, [facility]               Lorrene CHRISTELLA Hasten, [facility]               Lorrene CHRISTELLA Hasten, [facility]               Lorrene CHRISTELLA Hasten, [facility]               Lorrene CHRISTELLA Hasten, [facility]               Lorrene CHRISTELLA Hasten, [facility]               Lorrene CHRISTELLA Hasten, [facility]               Lorrene CHRISTELLA Hasten, [facility]               Lorrene CHRISTELLA Hasten, [facility]               Lorrene CHRISTELLA Hasten, [facility]               Lorrene CHRISTELLA Hasten, [facility]               Lorrene CHRISTELLA Hasten, [facility]               Lorrene CHRISTELLA Hasten, [facility]               Lorrene CHRISTELLA Hasten, [facility] "

## 2024-03-03 ENCOUNTER — Ambulatory Visit: Admitting: Behavioral Health

## 2024-03-31 ENCOUNTER — Ambulatory Visit: Admitting: Behavioral Health

## 2024-06-20 ENCOUNTER — Ambulatory Visit: Payer: Self-pay | Admitting: Physician Assistant

## 2024-11-07 ENCOUNTER — Encounter: Admitting: Internal Medicine
